# Patient Record
Sex: Male | Born: 1965 | Race: White | Hispanic: No | Smoking: Never smoker
Health system: Southern US, Community
[De-identification: ages and names within clinical notes are randomized; demographics above are authoritative.]

## PROBLEM LIST (undated history)

## (undated) DIAGNOSIS — C20 Malignant neoplasm of rectum: Secondary | ICD-10-CM

## (undated) DIAGNOSIS — F329 Major depressive disorder, single episode, unspecified: Secondary | ICD-10-CM

## (undated) DIAGNOSIS — F32A Depression, unspecified: Secondary | ICD-10-CM

## (undated) DIAGNOSIS — F419 Anxiety disorder, unspecified: Secondary | ICD-10-CM

## (undated) HISTORY — DX: Malignant neoplasm of rectum: C20

## (undated) HISTORY — PX: COLON SURGERY: SHX602

---

## 2013-12-10 ENCOUNTER — Ambulatory Visit (INDEPENDENT_AMBULATORY_CARE_PROVIDER_SITE_OTHER): Payer: BC Managed Care – PPO | Admitting: General Surgery

## 2013-12-10 ENCOUNTER — Encounter (INDEPENDENT_AMBULATORY_CARE_PROVIDER_SITE_OTHER): Payer: Self-pay | Admitting: General Surgery

## 2013-12-10 VITALS — BP 120/70 | HR 72 | Temp 97.1°F | Ht 69.0 in | Wt 170.8 lb

## 2013-12-10 DIAGNOSIS — K648 Other hemorrhoids: Secondary | ICD-10-CM

## 2013-12-10 NOTE — Progress Notes (Signed)
Subjective:     Patient ID: Jonathan Wright, male   DOB: 08-30-1965, 48 y.o.   MRN: [id number]  HPI Patient presents for followup of internal hemorrhoids. He saw me for this previously and underwent banding. He has developed some new symptoms with a mucus discharge. He also has a feeling of fullness in his rectal area. Occasional small amounts of bleeding. No significant pain.  Review of Systems     Objective:   Physical Exam  Constitutional: He appears well-developed and well-nourished.  Cardiovascular: Normal rate and normal heart sounds.   Pulmonary/Chest: Effort normal and breath sounds normal.  Abdominal: Soft. He exhibits no distension. There is no tenderness.  Genitourinary:  External anal exam reveals no external hemorrhoids or skin tags. Digital rectal exam reveals internal hemorrhoid towards the left side. Anoscopy reveals large inflamed internal hemorrhoid on the left side. Band applicator was used and 2 rubber bands were applied to this large internal hemorrhoid. There was no bleeding. He tolerated this well.       Assessment:     Inflamed internal hemorrhoid    Plan:     Rubber band ligation as above. Return in 6 weeks.

## 2014-01-21 ENCOUNTER — Encounter (INDEPENDENT_AMBULATORY_CARE_PROVIDER_SITE_OTHER): Payer: BC Managed Care – PPO | Admitting: General Surgery

## 2015-09-14 ENCOUNTER — Other Ambulatory Visit: Payer: Self-pay | Admitting: General Surgery

## 2015-09-14 NOTE — H&P (Signed)
History of Present Illness Leighton Ruff MD; 99991111 11:51 AM) The patient is a 50 year old male who presents with hemorrhoids. Pt has been seen in the past for hemorrhoid issues by Dr Grandville Silos. It sounds like he has done banding on occasion. More recently, his symptoms have changed. He feels a fullness in his rectal area, often feeling like he has to have a bowel movement. He is having constant internal pain on his left side. When he goes to the bathroom, frequently he only has a small bowel movement or passes gas. He is having has bleeding with bowel movements, and this is becoming more regular. He denies any significant constipation.   Problem List/Past Medical Leighton Ruff, MD; 99991111 11:51 AM) HEMORRHOIDS, INTERNAL, WITH BLEEDING (K64.8) RECTAL MASS (K62.9)  Other Problems Leighton Ruff, MD; 99991111 11:51 AM) Back Pain Anxiety Disorder  Past Surgical History Leighton Ruff, MD; 99991111 11:51 AM) No pertinent past surgical history  Diagnostic Studies History Leighton Ruff, MD; 99991111 11:51 AM) Colonoscopy never  Allergies Elbert Ewings, CMA; 09/14/2015 11:41 AM) Penicillin VK *PENICILLINS* Hives, Swelling.  Medication History Elbert Ewings, [place]; 09/14/2015 11:41 AM) No Current Medications Medications Reconciled  Social History Leighton Ruff, MD; 99991111 11:51 AM) Tobacco use Never smoker. No drug use Caffeine use Carbonated beverages, Tea. Alcohol use Occasional alcohol use.  Family History Leighton Ruff, MD; 99991111 11:51 AM) Family history unknown First Degree Relatives     Review of Systems Leighton Ruff MD; 99991111 11:51 AM) General Present- Fatigue. Not Present- Appetite Loss, Chills, Fever, Night Sweats, Weight Gain and Weight Loss. Skin Not Present- Change in Wart/Mole, Dryness, Hives, Jaundice, New Lesions, Non-Healing Wounds, Rash and Ulcer. HEENT Present- Wears glasses/contact lenses. Not Present- Earache, Hearing  Loss, Hoarseness, Nose Bleed, Oral Ulcers, Ringing in the Ears, Seasonal Allergies, Sinus Pain, Sore Throat, Visual Disturbances and Yellow Eyes. Respiratory Not Present- Bloody sputum, Chronic Cough, Difficulty Breathing, Snoring and Wheezing. Breast Not Present- Breast Mass, Breast Pain, Nipple Discharge and Skin Changes. Cardiovascular Not Present- Chest Pain, Difficulty Breathing Lying Down, Leg Cramps, Palpitations, Rapid Heart Rate, Shortness of Breath and Swelling of Extremities. Gastrointestinal Present- Bloody Stool, Excessive gas, Hemorrhoids and Rectal Pain. Not Present- Abdominal Pain, Bloating, Change in Bowel Habits, Chronic diarrhea, Constipation, Difficulty Swallowing, Gets full quickly at meals, Indigestion, Nausea and Vomiting. Male Genitourinary Not Present- Blood in Urine, Change in Urinary Stream, Frequency, Impotence, Nocturia, Painful Urination, Urgency and Urine Leakage. Musculoskeletal Present- Joint Pain. Not Present- Back Pain, Joint Stiffness, Muscle Pain, Muscle Weakness and Swelling of Extremities. Neurological Present- Headaches. Not Present- Decreased Memory, Fainting, Numbness, Seizures, Tingling, Tremor, Trouble walking and Weakness. Psychiatric Present- Anxiety and Change in Sleep Pattern. Not Present- Bipolar, Depression, Fearful and Frequent crying. Endocrine Not Present- Cold Intolerance, Excessive Hunger, Hair Changes, Heat Intolerance and New Diabetes.  Vitals Elbert Ewings CMA; 09/14/2015 11:42 AM) 09/14/2015 11:41 AM Weight: 179 lb Height: 69in Body Surface Area: 1.97 m Body Mass Index: 26.43 kg/m  Temp.: 97.59F(Temporal)  Pulse: 80 (Regular)  BP: 130/70 (Sitting, Left Arm, Standard)      Physical Exam Leighton Ruff MD; 99991111 11:57 AM)  General Mental Status-Alert. General Appearance-Not in acute distress. Build & Nutrition-Well nourished. Posture-Normal posture. Gait-Normal.  Head and Neck Head-normocephalic,  atraumatic with no lesions or palpable masses. Trachea-midline.  Chest and Lung Exam Chest and lung exam reveals -on auscultation, normal breath sounds, no adventitious sounds and normal vocal resonance.  Cardiovascular Cardiovascular examination reveals -normal heart sounds, regular rate and rhythm with no murmurs.  Abdomen Inspection Inspection of the abdomen reveals - No Hernias. Palpation/Percussion Palpation and Percussion of the abdomen reveal - Soft, Non Tender, No Rigidity (guarding), No hepatosplenomegaly and No Palpable abdominal masses.  Neurologic Neurologic evaluation reveals -alert and oriented x 3 with no impairment of recent or remote memory, normal attention span and ability to concentrate, normal sensation and normal coordination.  Musculoskeletal Normal Exam - Bilateral-Upper Extremity Strength Normal and Lower Extremity Strength Normal.    Assessment & Plan Leighton Ruff MD; 99991111 12:00 PM)  RECTAL MASS (K62.9) Impression: 50 year old male with rectal bleeding and a rectal mass palpated anteriorly on digital exam that spans the entire anterior distal rectum and anal canal. I am very concerned that this patient has a advanced rectal cancer. I have recommended a colonoscopy to begin his workup. If his biopsies are indeed positive for rectal cancer he will need a CT scan of the chest abdomen and pelvis and MRI of his pelvis for staging.

## 2015-09-15 ENCOUNTER — Telehealth: Payer: Self-pay | Admitting: *Deleted

## 2015-09-15 NOTE — Telephone Encounter (Signed)
The patient called requesting an appointment for a colonoscopy.  Saw Dr. Henri Medal, CCS.  He was told her has cancer. She advised him to call to schedule a colonoscopy.  I advised he needs to make an office visit.  Made appointment with Cecille Rubin Hvozdovic PA for Friday 09-17-2015 at 10:15 am.

## 2015-09-16 ENCOUNTER — Encounter (HOSPITAL_COMMUNITY): Payer: Self-pay | Admitting: *Deleted

## 2015-09-16 ENCOUNTER — Telehealth: Payer: Self-pay | Admitting: *Deleted

## 2015-09-16 NOTE — Telephone Encounter (Signed)
Per Claiborne Billings, Dr. Servando Snare nurse, Dr. Marcello Moores is going to do this patient's colonoscopy within the next week.  Claiborne Billings is trying to reach the patient. She advised me to cancel this appointment. I LM for him yesterday about the appointment and asked him to please call me if he decides to cancel.

## 2015-09-17 ENCOUNTER — Ambulatory Visit: Payer: Self-pay | Admitting: Physician Assistant

## 2015-09-18 ENCOUNTER — Encounter (HOSPITAL_COMMUNITY): Payer: Self-pay | Admitting: Anesthesiology

## 2015-09-18 NOTE — Anesthesia Preprocedure Evaluation (Deleted)
Anesthesia Evaluation  Patient identified by MRN, date of birth, ID band Patient awake    Reviewed: Allergy & Precautions, NPO status , Patient's Chart, lab work & pertinent test results  Airway        Dental   Pulmonary neg pulmonary ROS,           Cardiovascular negative cardio ROS       Neuro/Psych Anxiety Depression negative neurological ROS     GI/Hepatic Neg liver ROS, Rectal Mass, RO CA   Endo/Other  negative endocrine ROS  Renal/GU negative Renal ROS  negative genitourinary   Musculoskeletal negative musculoskeletal ROS (+)   Abdominal   Peds negative pediatric ROS (+)  Hematology negative hematology ROS (+)   Anesthesia Other Findings   Reproductive/Obstetrics negative OB ROS                            Anesthesia Physical Anesthesia Plan  ASA: II  Anesthesia Plan: MAC   Post-op Pain Management:    Induction: Intravenous  Airway Management Planned: Nasal Cannula  Additional Equipment:   Intra-op Plan:   Post-operative Plan:   Informed Consent: I have reviewed the patients History and Physical, chart, labs and discussed the procedure including the risks, benefits and alternatives for the proposed anesthesia with the patient or authorized representative who has indicated his/her understanding and acceptance.     Plan Discussed with:   Anesthesia Plan Comments:         Anesthesia Quick Evaluation

## 2015-09-24 ENCOUNTER — Ambulatory Visit (HOSPITAL_COMMUNITY): Admission: RE | Admit: 2015-09-24 | Payer: Self-pay | Source: Ambulatory Visit | Admitting: General Surgery

## 2015-09-24 HISTORY — DX: Anxiety disorder, unspecified: F41.9

## 2015-09-24 HISTORY — DX: Depression, unspecified: F32.A

## 2015-09-24 HISTORY — DX: Major depressive disorder, single episode, unspecified: F32.9

## 2015-09-24 SURGERY — COLONOSCOPY WITH PROPOFOL
Anesthesia: Monitor Anesthesia Care

## 2015-11-20 HISTORY — PX: PERIPHERALLY INSERTED CENTRAL CATHETER INSERTION: SHX2221

## 2015-12-16 ENCOUNTER — Encounter (HOSPITAL_COMMUNITY): Payer: Self-pay | Admitting: Hematology & Oncology

## 2015-12-16 ENCOUNTER — Encounter (HOSPITAL_COMMUNITY): Payer: BLUE CROSS/BLUE SHIELD | Attending: Hematology & Oncology | Admitting: Hematology & Oncology

## 2015-12-16 VITALS — BP 123/91 | HR 106 | Temp 98.4°F | Resp 18 | Ht 69.0 in | Wt 168.4 lb

## 2015-12-16 DIAGNOSIS — C20 Malignant neoplasm of rectum: Secondary | ICD-10-CM

## 2015-12-16 DIAGNOSIS — K5903 Drug induced constipation: Secondary | ICD-10-CM | POA: Diagnosis not present

## 2015-12-16 DIAGNOSIS — C210 Malignant neoplasm of anus, unspecified: Secondary | ICD-10-CM | POA: Insufficient documentation

## 2015-12-16 DIAGNOSIS — T402X5A Adverse effect of other opioids, initial encounter: Secondary | ICD-10-CM

## 2015-12-16 MED ORDER — ONDANSETRON HCL 8 MG PO TABS: 8.0000 mg | ORAL_TABLET | Freq: Three times a day (TID) | ORAL | Status: DC | PRN

## 2015-12-16 MED ORDER — PROCHLORPERAZINE MALEATE 10 MG PO TABS: 10.0000 mg | ORAL_TABLET | Freq: Four times a day (QID) | ORAL | Status: DC | PRN

## 2015-12-16 NOTE — Progress Notes (Signed)
Jonathan Wright  Progress Note  Patient Care Team: [name], MD as PCP - General (Family Medicine)  CHIEF COMPLAINTS/PURPOSE OF CONSULTATION:   Oncology History   Stage IIIB rectal adenocarcinoma, mT2N2M0 Treated with 5Gy x 5 at [facility]     Rectal cancer [facility])   09/20/2015 Procedure colonoscopy with firm rectal mass and 4 cm polyp in proximal rectum, infiltrating non-obstructing large mass within the distal rectum extending towards anal verge   09/20/2015 Miscellaneous Microsatellite stable.    09/28/2015 Imaging CT C/A/P no evidence of distant metastatic disease. irregular thickening of the low rectum and anal canal, several enlarged perirectal LN, indeterminate 53m RUL nodule   09/28/2015 Imaging MRI pelvis assym diffuse circum lobular thickening of rectum, near full wall thickness extension along lower anterior rectum W/O extra serosal extension c/w known adeno, mult. perirectal LN    HISTORY OF PRESENTING ILLNESS:  [name]89y.o. male is here for further follow-up of adenocarcinoma of the rectum. Tumor extends from the recto sigmoid junction to the anal verge. [facility] He wishes to receive his chemotherapy locally.   Mr. PDowsonwas here with his roommate [name].  In October 2016 he noticed that he had a dull pain all the time at his "sphincter muscle." He thought that this was another hemorrhoid because he has always had problems with this. He said that "he got used to something being there." He did note however, that this was a different kind of pain. He saw his routine surgeon and was referred to Dr. ALeighton Ruff  He saw Dr. TMarcello Mooresand this is when he was told that he had cancer. [facility]. He was told that he had a polyp the size of a golf ball. They did biopsies of this and was told that this was clear but that it needs to come  out. He was also told by a different doctor that he needed to have his entire rectum taken out and that he would need a ostomy bag for the rest of his life. He was shocked from this news and did not want to have to do this. He notes that [facility]   He understands "standard of care" but at least wanted to try to avoid an ostomy. He opted to be treated with short course radiation, 5GY x 5 of XRT followed by chemotherapy.  He finished this radiation last Friday. He is here today to discuss chemotherapy. He would rather have a PICC line rather than having a port placed if possible.   He notes that resection was discussed after completion of therapy but he wants to avoid this if possible. He notes today that he understands that watch/wait approach if he achieves a CR is investigational.   He has a prescription for Oxycodone and has been needing to take this recently.  He has noticed that for the last couple of days he has had to strain when he has bowel movements. He thinks this may be secondary to his pain medication.   He denies any nausea, vomiting, blood in stool, or pain in his lymph nodes of the groin.   MEDICAL HISTORY:  Past Medical History  Diagnosis Date  . Depression   . Anxiety     SURGICAL HISTORY: History reviewed. No pertinent past surgical history.  SOCIAL HISTORY: Social History   Social History  . Marital Status: Divorced    Spouse Name: [name]  . Number of Children: [name]  . Years of Education: [name]   Occupational History  . Not on file.   Social History Main Topics  . Smoking status: Never Smoker   . Smokeless tobacco: Not on file  . Alcohol Use: Yes     Comment: occ.   . Drug Use: No  . Sexual Activity: Not on file   Other Topics Concern  . 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 No pertinent family history. Mother-Healthy Father-deceased; late 67s early 33s. Doesn't know much about him 2 brothers-both healthy No other cancer in his family ALLERGIES:  is allergic to penicillins and penicillin g.  MEDICATIONS:  Current Outpatient Prescriptions  Medication Sig Dispense Refill  . oxyCODONE (OXY IR/ROXICODONE) 5 MG immediate release tablet Take by mouth.    . traMADol (ULTRAM) 50 MG tablet Take by mouth.    . ondansetron (ZOFRAN) 8 MG tablet Take 1 tablet (8 mg total) by mouth every 8 (eight) hours as needed for nausea or vomiting. 30 tablet 2  . prochlorperazine (COMPAZINE) 10 MG tablet Take 1 tablet (10 mg total) by mouth every 6 (six) hours as needed for nausea or vomiting. 30 tablet 2   No current facility-administered medications for this visit.    Review of Systems  Constitutional: Negative.  Negative for fever, chills, weight loss and malaise/fatigue.  HENT: Negative.  Negative for congestion, hearing loss, nosebleeds, sore throat and tinnitus.   Eyes: Negative.  Negative for blurred vision, double vision, pain and discharge.  Respiratory: Negative.  Negative for cough, hemoptysis, sputum production, shortness of breath and wheezing.   Cardiovascular: Negative.  Negative for chest pain, palpitations, claudication, leg swelling and PND.  Gastrointestinal: Positive for constipation. Negative for heartburn, nausea, vomiting, abdominal pain, diarrhea, blood in stool and melena.       Rectal pain  Genitourinary: Negative.  Negative for dysuria, urgency, frequency and hematuria.  Musculoskeletal: Negative.  Negative for myalgias, joint pain and falls.  Skin: Negative.  Negative for itching and rash.  Neurological: Negative.  Negative for dizziness, tingling, tremors, sensory change, speech change, focal weakness, seizures, loss of consciousness, weakness and headaches.  Endo/Heme/Allergies: Negative.   Does not bruise/bleed easily.  Psychiatric/Behavioral: Negative.  Negative for depression, suicidal ideas, memory loss and substance abuse. The patient is not nervous/anxious and does not have insomnia.   All other systems reviewed and are negative.  14 point ROS was done and is otherwise as detailed above or in HPI   PHYSICAL EXAMINATION: ECOG PERFORMANCE STATUS: 1 - Symptomatic but completely ambulatory  Filed Vitals:   12/16/15 1445  BP: 123/91  Pulse: 106  Temp: 98.4 F (36.9 C)  Resp: 18   Filed Weights   12/16/15 1445  Weight: 168 lb 6.4 oz (76.386 kg)   Physical Exam  Constitutional: He is oriented to person, place, and time and well-developed, well-nourished, and in no distress.  HENT:  Head: Normocephalic and atraumatic.  Nose: Nose normal.  Mouth/Throat: Oropharynx is clear and moist. No oropharyngeal exudate.  Eyes: Conjunctivae and EOM are normal. Pupils are equal, round, and reactive to light. Right eye exhibits no discharge. Left eye exhibits no discharge. No  scleral icterus.  Neck: Normal range of motion. Neck supple. No tracheal deviation present. No thyromegaly present.  Cardiovascular: Normal rate, regular rhythm and normal heart sounds.  Exam reveals no gallop and no friction rub.   No murmur heard. Pulmonary/Chest: Effort normal and breath sounds normal. He has no wheezes. He has no rales.  Abdominal: Soft. Bowel sounds are normal. He exhibits no distension and no mass. There is no tenderness. There is no rebound and no guarding.  Musculoskeletal: Normal range of motion. He exhibits no edema.  Lymphadenopathy:    He has no cervical adenopathy.  Neurological: He is alert and oriented to person, place, and time. He has normal reflexes. No cranial nerve deficit. Gait normal. Coordination normal.  Skin: Skin is warm and dry. No rash noted.  Psychiatric: Mood, memory, affect and judgment normal.  Nursing note and vitals reviewed.  LABORATORY DATA:  I have  reviewed the data as listed        RADIOGRAPHIC STUDIES: Reports reviewed and as detailed in Oncology History   ASSESSMENT & PLAN:  T2N2aM0 adenocarcinoma of the rectum Tumor extends from rectosigmoid junction to anal verge S/P short course [facility]RT at [facility]  We spent time discussing his care to date. He is certainly well educated regarding his treatment options and choices. We discussed chemotherapy and specifically FOLFOX. I advised him that I prefer FOLFOX over CapeOx for multiple reasons, I certainly feel it has much more predictable toxicities.   He would like a PICC. I do not feel this is unreasonable. We can arrange for PICC line placement prior to initiation of chemotherapy on the 8th.   We discussed narcotic induced constipation and he was provided with a constipation sheet. I emphasized the importance of preventing constipation.   He will return on 01/03/16 for a chemotherapy follow-up with Tom to assess tolerance. We will arrange for chemotherapy teaching with [facility]. He will meet our patient navigator [place] today.    All questions were answered. The patient knows to call the clinic with any problems, questions or concerns.  This document serves as a record of services personally performed by Ancil Linsey, MD. It was created on her behalf by Kandace Blitz, a trained medical scribe. The creation of this record is based on the scribe's personal observations and the provider's statements to them. This document has been checked and approved by the attending provider.  I have reviewed the above documentation for accuracy and completeness, and I agree with the above.  This note was electronically signed.    Molli Hazard, MD  12/16/2015 3:52 PM

## 2015-12-16 NOTE — Progress Notes (Signed)
[facility] at [facility]  Discharge Instructions:   _______________________________________________________________  Thank you for choosing [facility] at [facility] to provide your oncology and hematology care.  To afford each patient quality time with our providers, please arrive at least 15 minutes before your scheduled appointment.  You need to re-schedule your appointment if you arrive 10 or more minutes late.  We strive to give you quality time with our providers, and arriving late affects you and other patients whose appointments are after yours.  Also, if you no show three or more times for appointments you may be dismissed from the clinic.  Again, thank you for choosing [facility] at [facility]. Our hope is that these requests will allow you access to exceptional care and in a timely manner. _______________________________________________________________  If you have questions after your visit, please contact our office at [phone] between the hours of 8:30 a.m. and 5:00 p.m. Voicemails left after 4:30 p.m. will not be returned until the following business day. _______________________________________________________________  For prescription refill requests, have your pharmacy contact our office. _______________________________________________________________  Recommendations made by the consultant and any test results will be sent to your referring physician. _______________________________________________________________ 

## 2015-12-16 NOTE — Patient Instructions (Addendum)
[place] at [facility] Discharge Instructions  RECOMMENDATIONS MADE BY THE CONSULTANT AND ANY TEST RESULTS WILL BE SENT TO YOUR REFERRING PHYSICIAN.   Exam and discussion by Dr Whitney Muse today You will start FOLFOX, you will have 6 cycles Jonathan Wright is our nurse navigator, she will do the teaching for your drugs and get you started here at the clinic.  You can contact her at [phone] if you have any questions.  She does have a Advertising account executive. We will get a PICC line inserted. Short Stay will do this procedure. Please call the clinic if you have any questions or concerns     Thank you for choosing [place] [facility] to provide your oncology and hematology care.  To afford each patient quality time with our provider, please arrive at least 15 minutes before your scheduled appointment time.   Beginning January 23rd 2017 lab work for the [facility] will be done in the  Main lab at [facility] on 1st floor. If you have a lab appointment with the [facility] please come in thru the  Main Entrance and check in at the main information desk  You need to re-schedule your appointment should you arrive 10 or more minutes late.  We strive to give you quality time with our providers, and arriving late affects you and other patients whose appointments are after yours.  Also, if you no show three or more times for appointments you may be dismissed from the clinic at the providers discretion.     Again, thank you for choosing [facility].  Our hope is that these requests will decrease the amount of time that you wait before being seen by our physicians.       _____________________________________________________________  Should you have questions after your visit to [facility], please contact our office at [phone] between the hours of 8:30 a.m. and 4:30 p.m.  Voicemails left after 4:30 p.m. will not be returned until  the following business day.  For prescription refill requests, have your pharmacy contact our office.         Resources For Cancer Patients and their Caregivers ? American Cancer Society: Can assist with transportation, wigs, general needs, runs Look Good Feel Better.        (216) 455-4634 ? Cancer Care: Provides financial assistance, online support groups, medication/co-pay assistance.  1-800-813-HOPE 330-763-4271) ? [place] Assists [place] cancer patients and their families through emotional , educational and financial support.  (214)830-8280 ? [place] DSS Where to apply for food stamps, Medicaid and utility assistance. 830-445-5277 ? [facility]: Transportation to medical appointments. (619) 702-4007 ? Social Security Administration: May apply for disability if have a Stage IV cancer. (636) 741-3773 (802) 206-6974 ? [employer], Disability and Transit Services: Assists with nutrition, care and transit needs. 254-200-4036

## 2015-12-17 ENCOUNTER — Encounter (HOSPITAL_COMMUNITY): Payer: Self-pay | Admitting: Hematology & Oncology

## 2015-12-17 DIAGNOSIS — C20 Malignant neoplasm of rectum: Secondary | ICD-10-CM

## 2015-12-17 HISTORY — DX: Malignant neoplasm of rectum: C20

## 2015-12-22 ENCOUNTER — Other Ambulatory Visit (HOSPITAL_COMMUNITY): Payer: Self-pay | Admitting: Hematology & Oncology

## 2015-12-23 ENCOUNTER — Other Ambulatory Visit (HOSPITAL_COMMUNITY): Payer: Self-pay | Admitting: Oncology

## 2015-12-23 ENCOUNTER — Other Ambulatory Visit (HOSPITAL_COMMUNITY): Payer: Self-pay | Admitting: *Deleted

## 2015-12-23 DIAGNOSIS — C20 Malignant neoplasm of rectum: Secondary | ICD-10-CM

## 2015-12-23 NOTE — Patient Instructions (Addendum)
[place]   CHEMOTHERAPY INSTRUCTIONS  Premeds: Aloxi - for nausea/vomiting prevention/reduction. Dexamethasone - steroid - given to reduce the risk of you having an allergic type reaction to the chemotherapy. Dex can cause you to feel energized, nervous/anxious/jittery, make you have trouble sleeping, and/or make you feel hot/flushed in the face/neck and/or look pink/red in the face/neck. These side effects will pass as the Dex wears off. (takes 20 minutes to infuse)  Oxaliplatin - anaphylactic reaction, neurotoxicity (i.e., headache, fatigue, difficulty sleeping, pain). Peripheral neuropathy (numbness/tingling/burning in hands/fingers/feet/toes) - will be aggravated by cold/cool temperatures. We need to know when you develop peripheral neuropathy so that we can monitor it and treat if necessary. Nausea/vomiting, diarrhea, bone marrow suppression (lowers white blood cells (fight infection), lowers red blood cells (make up your blood), lowers platelets (help blood to clot). Pulmonary fibrosis. Once you have received Oxaliplatin do NOT eat or drinking anything cold/cool for 5-10 days! Do NOT breathe in cold/cool air and do NOT touch anything cold for 5-10 days. The time frame varies from patient to patient on the length of time you must abstain from the above mentioned. Best advice is to wait at least 5 days before attempting to reintroduce cold/cool back into life. Slowly reintroduce cool/cold things! Wear gloves when getting items out of the refrigerator (of course, these would be things you are going to heat to eat)! (takes 2 hours to infuse)  Leucovorin - this is a medication that is not chemo but given with chemo. This med "rescues" the healthy cells before we administer the drug 5FU. This makes the 5FU work better. (takes 2 hours to infuse- this goes in @ the same time as the Oxaliplatin or Irinotecan chemo)  5FU: bone marrow suppression (low white blood cells  - wbcs fight infection, low red blood cells - rbcs make up your blood, low platelets - this is what makes your blood clot, nausea/vomiting, diarrhea, mouth sores, hair loss, dry skin, ocular toxicities (increased tear production, sensitivity to light). You must wear sunscreen/sunglasses. Cover your skin when out in sunlight. You will get burned very easily. The nurse will sit @ your bedside and push this medication in via a syringe. This will take 10 minutes. Then she will connect you to an ambulatory pump with this medication attached. You will wear the pump for 46 hours. The 5FU chemo will infuse for 46 hours total. Then the pump will be removed).      POTENTIAL SIDE EFFECTS OF TREATMENT: Increased Susceptibility to Infection, Vomiting, Constipation, Hair Thinning, Changes in Character of Skin and Nails (brittleness, dryness,etc.), Pigment Changes (darkening of nail beds, palms of hands, soles of feet, etc.), Bone Marrow Suppression, Abdominal Cramping and Urinary Frequency   EDUCATIONAL MATERIALS GIVEN AND REVIEWED: Chemotherapy and You booklet Specific Instructions Sheets: Oxaliplatin, Leucovorin, 5FU   SELF CARE ACTIVITIES WHILE ON CHEMOTHERAPY: Increase your fluid intake 48 hours prior to treatment and drink at least 2 quarts per day after treatment., No alcohol intake., No aspirin or other medications unless approved by your oncologist., Eat foods that are light and easy to digest., No fried, fatty, or spicy foods immediately before or after treatment., Have teeth cleaned professionally before starting treatment. Keep dentures and partial plates clean., Use soft toothbrush and do not use mouthwashes that contain alcohol. Biotene is a good mouthwash that is available at most pharmacies or may be ordered by calling [phone]., Use warm salt water gargles (1 teaspoon salt per 1 quart  warm water) before and after meals and at bedtime. Or you may rinse with 2 tablespoons of three -percent  hydrogen peroxide mixed in eight ounces of water., Always use sunscreen with SPF (Sun Protection Factor) of 50 or higher., Use your nausea medication as directed to prevent nausea., Use your stool softener or laxative as directed to prevent constipation. and Use your anti-diarrheal medication as directed to stop diarrhea.  Please wash your hands for at least 30 seconds using warm soapy water. Handwashing is the #1 way to prevent the spread of germs. Stay away from sick people or people who are getting over a cold. If you develop respiratory systems such as green/yellow mucus production or productive cough or persistent cough let us know and we will see if you need an antibiotic. It is a good idea to keep a pair of gloves on when going into grocery stores/[employer] to decrease your risk of coming into contact with germs on the carts, etc. Carry alcohol hand gel with you at all times and use it frequently if out in public. All foods need to be cooked thoroughly. No raw foods. No medium or undercooked meats, eggs. If your food is cooked medium well, it does not need to be hot pink or saturated with bloody liquid at all. Vegetables and fruits need to be washed/rinsed under the faucet with a dish detergent before being consumed. You can eat raw fruits and vegetables unless we tell you otherwise but it would be best if you cooked them or bought frozen. Do not eat off of salad bars or hot bars unless you really trust the cleanliness of the restaurant. If you need dental work, please let Dr. Whitney Muse know before you go for your appointment so that we can coordinate the best possible time for you in regards to your chemo regimen. You need to also let your dentist know that you are actively taking chemo. We may need to do labs prior to your dental appointment. We also want your bowels moving at least every other day. If this is not happening, we need to know so that we can get you on a bowel regimen to help you go. If you are  going to have sex, you must wear a condom to prevent your partner from chemotherapy exposure. This should be done for up to 28 days post chemo completion.      MEDICATIONS: You have been given prescriptions for the following medications:  Zofran/Ondansetron 8mg  tablet. Take 1 tablet every 8 hours as needed for nausea/vomiting. (#1 nausea med to take, this can constipate)  Compazine/Prochlorperazine 10mg  tablet. Take 1 tablet every 6 hours as needed for nausea/vomiting. (#2 nausea med to take, this can make you sleepy)  EMLA cream. Apply a quarter size amount to port site 1 hour prior to chemo. Do not rub in. Cover with plastic wrap.   Over-the-Counter Meds:  Miralax 1 capful in 8 oz of fluid daily. May increase to two times a day if needed. This is a stool softener. If this doesn't work proceed you can add:  Senokot S  - start with 1 tablet two times a day and increase to 4 tablets two times a day if needed. (total of 8 tablets in a 24 hour period). This is a stimulant laxative.   Call us if this does not help your bowels move.   Imodium 2mg  capsule. Take 2 capsules after the 1st loose stool and then 1 capsule every 2 hours until you go  a total of 12 hours without having a loose stool. Call the [facility] if loose stools continue. If diarrhea occurs @ bedtime, take 2 capsules @ bedtime. Then take 2 capsules every 4 hours until morning. Call [facility].     SYMPTOMS TO REPORT AS SOON AS POSSIBLE AFTER TREATMENT:  FEVER GREATER THAN 100.5 F  CHILLS WITH OR WITHOUT FEVER  NAUSEA AND VOMITING THAT IS NOT CONTROLLED WITH YOUR NAUSEA MEDICATION  UNUSUAL SHORTNESS OF BREATH  UNUSUAL BRUISING OR BLEEDING  TENDERNESS IN MOUTH AND THROAT WITH OR WITHOUT PRESENCE OF ULCERS  URINARY PROBLEMS  BOWEL PROBLEMS  UNUSUAL RASH    Wear comfortable clothing and clothing appropriate for easy access to any Portacath or PICC line. Let us know if there is anything that we can do to  make your therapy better!      I have been informed and understand all of the instructions given to me and have received a copy. I have been instructed to call the clinic [phone] or my family physician as soon as possible for continued medical care, if indicated. I do not have any more questions at this time but understand that I may call the [facility] or the Patient Navigator at [phone] during office hours should I have questions or need assistance in obtaining follow-up care.           Palonosetron Injection What is this medicine? PALONOSETRON (pal oh NOE se tron) is used to prevent nausea and vomiting caused by chemotherapy. It also helps prevent delayed nausea and vomiting that may occur a few days after your treatment. This medicine may be used for other purposes; ask your health care provider or pharmacist if you have questions. What should I tell my health care provider before I take this medicine? They need to know if you have any of these conditions: -an unusual or allergic reaction to palonosetron, dolasetron, granisetron, ondansetron, other medicines, foods, dyes, or preservatives -pregnant or trying to get pregnant -breast-feeding How should I use this medicine? This medicine is for infusion into a vein. It is given by a health care professional in a hospital or clinic setting. Talk to your pediatrician regarding the use of this medicine in children. While this drug may be prescribed for children as young as 1 month for selected conditions, precautions do apply. Overdosage: If you think you have taken too much of this medicine contact a poison control center or emergency room at once. NOTE: This medicine is only for you. Do not share this medicine with others. What if I miss a dose? This does not apply. What may interact with this medicine? -certain medicines for depression, anxiety, or psychotic disturbances -fentanyl -linezolid -MAOIs like Carbex,  Eldepryl, Marplan, Nardil, and Parnate -methylene blue (injected into a vein) -tramadol This list may not describe all possible interactions. Give your health care provider a list of all the medicines, herbs, non-prescription drugs, or dietary supplements you use. Also tell them if you smoke, drink alcohol, or use illegal drugs. Some items may interact with your medicine. What should I watch for while using this medicine? Your condition will be monitored carefully while you are receiving this medicine. What side effects may I notice from receiving this medicine? Side effects that you should report to your doctor or health care professional as soon as possible: -allergic reactions like skin rash, itching or hives, swelling of the face, lips, or tongue -breathing problems -confusion -dizziness -fast, irregular heartbeat -fever and  chills -loss of balance or coordination -seizures -sweating -swelling of the hands and feet -tremors -unusually weak or tired Side effects that usually do not require medical attention (report to your doctor or health care professional if they continue or are bothersome): -constipation or diarrhea -headache This list may not describe all possible side effects. Call your doctor for medical advice about side effects. You may report side effects to FDA at 1-800-FDA-1088. Where should I keep my medicine? This drug is given in a hospital or clinic and will not be stored at home. NOTE: This sheet is a summary. It may not cover all possible information. If you have questions about this medicine, talk to your doctor, pharmacist, or health care provider.    2016, Elsevier/Gold Standard. (2013-06-13 10:38:36) Dexamethasone injection What is this medicine? DEXAMETHASONE (dex a METH a sone) is a corticosteroid. It is used to treat inflammation of the skin, joints, lungs, and other organs. Common conditions treated include asthma, allergies, and arthritis. It is also used  for other conditions, like blood disorders and diseases of the adrenal glands. This medicine may be used for other purposes; ask your health care provider or pharmacist if you have questions. What should I tell my health care provider before I take this medicine? They need to know if you have any of these conditions: -blood clotting problems -Cushing's syndrome -diabetes -glaucoma -heart problems or disease -high blood pressure -infection like herpes, measles, tuberculosis, or chickenpox -kidney disease -liver disease -mental problems -myasthenia gravis -osteoporosis -previous heart attack -seizures -stomach, ulcer or intestine disease including colitis and diverticulitis -thyroid problem -an unusual or allergic reaction to dexamethasone, corticosteroids, other medicines, lactose, foods, dyes, or preservatives -pregnant or trying to get pregnant -breast-feeding How should I use this medicine? This medicine is for injection into a muscle, joint, lesion, soft tissue, or vein. It is given by a health care professional in a hospital or clinic setting. Talk to your pediatrician regarding the use of this medicine in children. Special care may be needed. Overdosage: If you think you have taken too much of this medicine contact a poison control center or emergency room at once. NOTE: This medicine is only for you. Do not share this medicine with others. What if I miss a dose? This may not apply. If you are having a series of injections over a prolonged period, try not to miss an appointment. Call your doctor or health care professional to reschedule if you are unable to keep an appointment. What may interact with this medicine? Do not take this medicine with any of the following medications: -mifepristone, RU-486 -vaccines This medicine may also interact with the following medications: -amphotericin B -antibiotics like clarithromycin, erythromycin, and troleandomycin -aspirin and  aspirin-like drugs -barbiturates like phenobarbital -carbamazepine -cholestyramine -cholinesterase inhibitors like donepezil, galantamine, rivastigmine, and tacrine -cyclosporine -digoxin -diuretics -ephedrine -male hormones, like estrogens or progestins and birth control pills -indinavir -isoniazid -ketoconazole -medicines for diabetes -medicines that improve muscle tone or strength for conditions like myasthenia gravis -NSAIDs, medicines for pain and inflammation, like ibuprofen or naproxen -phenytoin -rifampin -thalidomide -warfarin This list may not describe all possible interactions. Give your health care provider a list of all the medicines, herbs, non-prescription drugs, or dietary supplements you use. Also tell them if you smoke, drink alcohol, or use illegal drugs. Some items may interact with your medicine. What should I watch for while using this medicine? Your condition will be monitored carefully while you are receiving this medicine. If you are taking  this medicine for a long time, carry an identification card with your name and address, the type and dose of your medicine, and your doctor's name and address. This medicine may increase your risk of getting an infection. Stay away from people who are sick. Tell your doctor or health care professional if you are around anyone with measles or chickenpox. Talk to your health care provider before you get any vaccines that you take this medicine. If you are going to have surgery, tell your doctor or health care professional that you have taken this medicine within the last twelve months. Ask your doctor or health care professional about your diet. You may need to lower the amount of salt you eat. The medicine can increase your blood sugar. If you are a diabetic check with your doctor if you need help adjusting the dose of your diabetic medicine. What side effects may I notice from receiving this medicine? Side effects that you  should report to your doctor or health care professional as soon as possible: -allergic reactions like skin rash, itching or hives, swelling of the face, lips, or tongue -black or tarry stools -change in the amount of urine -changes in vision -confusion, excitement, restlessness, a false sense of well-being -fever, sore throat, sneezing, cough, or other signs of infection, wounds that will not heal -hallucinations -increased thirst -mental depression, mood swings, mistaken feelings of self importance or of being mistreated -pain in hips, back, ribs, arms, shoulders, or legs -pain, redness, or irritation at the injection site -redness, blistering, peeling or loosening of the skin, including inside the mouth -rounding out of face -swelling of feet or lower legs -unusual bleeding or bruising -unusual tired or weak -wounds that do not heal Side effects that usually do not require medical attention (report to your doctor or health care professional if they continue or are bothersome): -diarrhea or constipation -change in taste -headache -nausea, vomiting -skin problems, acne, thin and shiny skin -touble sleeping -unusual growth of hair on the face or body -weight gain This list may not describe all possible side effects. Call your doctor for medical advice about side effects. You may report side effects to FDA at 1-800-FDA-1088. Where should I keep my medicine? This drug is given in a hospital or clinic and will not be stored at home. NOTE: This sheet is a summary. It may not cover all possible information. If you have questions about this medicine, talk to your doctor, pharmacist, or health care provider.    2016, Elsevier/Gold Standard. (2007-11-28 14:04:12) Oxaliplatin Injection What is this medicine? OXALIPLATIN (ox AL i PLA tin) is a chemotherapy drug. It targets fast dividing cells, like cancer cells, and causes these cells to die. This medicine is used to treat cancers of the  colon and rectum, and many other cancers. This medicine may be used for other purposes; ask your health care provider or pharmacist if you have questions. What should I tell my health care provider before I take this medicine? They need to know if you have any of these conditions: -kidney disease -an unusual or allergic reaction to oxaliplatin, other chemotherapy, other medicines, foods, dyes, or preservatives -pregnant or trying to get pregnant -breast-feeding How should I use this medicine? This drug is given as an infusion into a vein. It is administered in a hospital or clinic by a specially trained health care professional. Talk to your pediatrician regarding the use of this medicine in children. Special care may be needed. Overdosage: If you  think you have taken too much of this medicine contact a poison control center or emergency room at once. NOTE: This medicine is only for you. Do not share this medicine with others. What if I miss a dose? It is important not to miss a dose. Call your doctor or health care professional if you are unable to keep an appointment. What may interact with this medicine? -medicines to increase blood counts like filgrastim, pegfilgrastim, sargramostim -probenecid -some antibiotics like amikacin, gentamicin, neomycin, polymyxin B, streptomycin, tobramycin -zalcitabine Talk to your doctor or health care professional before taking any of these medicines: -acetaminophen -aspirin -ibuprofen -ketoprofen -naproxen This list may not describe all possible interactions. Give your health care provider a list of all the medicines, herbs, non-prescription drugs, or dietary supplements you use. Also tell them if you smoke, drink alcohol, or use illegal drugs. Some items may interact with your medicine. What should I watch for while using this medicine? Your condition will be monitored carefully while you are receiving this medicine. You will need important blood work  done while you are taking this medicine. This medicine can make you more sensitive to cold. Do not drink cold drinks or use ice. Cover exposed skin before coming in contact with cold temperatures or cold objects. When out in cold weather wear warm clothing and cover your mouth and nose to warm the air that goes into your lungs. Tell your doctor if you get sensitive to the cold. This drug may make you feel generally unwell. This is not uncommon, as chemotherapy can affect healthy cells as well as cancer cells. Report any side effects. Continue your course of treatment even though you feel ill unless your doctor tells you to stop. In some cases, you may be given additional medicines to help with side effects. Follow all directions for their use. Call your doctor or health care professional for advice if you get a fever, chills or sore throat, or other symptoms of a cold or flu. Do not treat yourself. This drug decreases your body's ability to fight infections. Try to avoid being around people who are sick. This medicine may increase your risk to bruise or bleed. Call your doctor or health care professional if you notice any unusual bleeding. Be careful brushing and flossing your teeth or using a toothpick because you may get an infection or bleed more easily. If you have any dental work done, tell your dentist you are receiving this medicine. Avoid taking products that contain aspirin, acetaminophen, ibuprofen, naproxen, or ketoprofen unless instructed by your doctor. These medicines may hide a fever. Do not become pregnant while taking this medicine. Women should inform their doctor if they wish to become pregnant or think they might be pregnant. There is a potential for serious side effects to an unborn child. Talk to your health care professional or pharmacist for more information. Do not breast-feed an infant while taking this medicine. Call your doctor or health care professional if you get diarrhea. Do  not treat yourself. What side effects may I notice from receiving this medicine? Side effects that you should report to your doctor or health care professional as soon as possible: -allergic reactions like skin rash, itching or hives, swelling of the face, lips, or tongue -low blood counts - This drug may decrease the number of white blood cells, red blood cells and platelets. You may be at increased risk for infections and bleeding. -signs of infection - fever or chills, cough, sore throat, pain  or difficulty passing urine -signs of decreased platelets or bleeding - bruising, pinpoint red spots on the skin, black, tarry stools, nosebleeds -signs of decreased red blood cells - unusually weak or tired, fainting spells, lightheadedness -breathing problems -chest pain, pressure -cough -diarrhea -jaw tightness -mouth sores -nausea and vomiting -pain, swelling, redness or irritation at the injection site -pain, tingling, numbness in the hands or feet -problems with balance, talking, walking -redness, blistering, peeling or loosening of the skin, including inside the mouth -trouble passing urine or change in the amount of urine Side effects that usually do not require medical attention (report to your doctor or health care professional if they continue or are bothersome): -changes in vision -constipation -hair loss -loss of appetite -metallic taste in the mouth or changes in taste -stomach pain This list may not describe all possible side effects. Call your doctor for medical advice about side effects. You may report side effects to FDA at 1-800-FDA-1088. Where should I keep my medicine? This drug is given in a hospital or clinic and will not be stored at home. NOTE: This sheet is a summary. It may not cover all possible information. If you have questions about this medicine, talk to your doctor, pharmacist, or health care provider.    2016, Elsevier/Gold Standard. (2008-03-03  17:22:47) Leucovorin injection What is this medicine? LEUCOVORIN (loo koe VOR in) is used to prevent or treat the harmful effects of some medicines. This medicine is used to treat anemia caused by a low amount of folic acid in the body. It is also used with 5-fluorouracil (5-FU) to treat colon cancer. This medicine may be used for other purposes; ask your health care provider or pharmacist if you have questions. What should I tell my health care provider before I take this medicine? They need to know if you have any of these conditions: -anemia from low levels of vitamin B-12 in the blood -an unusual or allergic reaction to leucovorin, folic acid, other medicines, foods, dyes, or preservatives -pregnant or trying to get pregnant -breast-feeding How should I use this medicine? This medicine is for injection into a muscle or into a vein. It is given by a health care professional in a hospital or clinic setting. Talk to your pediatrician regarding the use of this medicine in children. Special care may be needed. Overdosage: If you think you have taken too much of this medicine contact a poison control center or emergency room at once. NOTE: This medicine is only for you. Do not share this medicine with others. What if I miss a dose? This does not apply. What may interact with this medicine? -capecitabine -fluorouracil -phenobarbital -phenytoin -primidone -trimethoprim-sulfamethoxazole This list may not describe all possible interactions. Give your health care provider a list of all the medicines, herbs, non-prescription drugs, or dietary supplements you use. Also tell them if you smoke, drink alcohol, or use illegal drugs. Some items may interact with your medicine. What should I watch for while using this medicine? Your condition will be monitored carefully while you are receiving this medicine. This medicine may increase the side effects of 5-fluorouracil, 5-FU. Tell your doctor or health  care professional if you have diarrhea or mouth sores that do not get better or that get worse. What side effects may I notice from receiving this medicine? Side effects that you should report to your doctor or health care professional as soon as possible: -allergic reactions like skin rash, itching or hives, swelling of the face, lips, or  tongue -breathing problems -fever, infection -mouth sores -unusual bleeding or bruising -unusually weak or tired Side effects that usually do not require medical attention (report to your doctor or health care professional if they continue or are bothersome): -constipation or diarrhea -loss of appetite -nausea, vomiting This list may not describe all possible side effects. Call your doctor for medical advice about side effects. You may report side effects to FDA at 1-800-FDA-1088. Where should I keep my medicine? This drug is given in a hospital or clinic and will not be stored at home. NOTE: This sheet is a summary. It may not cover all possible information. If you have questions about this medicine, talk to your doctor, pharmacist, or health care provider.    2016, Elsevier/Gold Standard. (2008-02-11 16:50:29) Fluorouracil, 5-FU injection What is this medicine? FLUOROURACIL, 5-FU (flure oh YOOR a sil) is a chemotherapy drug. It slows the growth of cancer cells. This medicine is used to treat many types of cancer like breast cancer, colon or rectal cancer, pancreatic cancer, and stomach cancer. This medicine may be used for other purposes; ask your health care provider or pharmacist if you have questions. What should I tell my health care provider before I take this medicine? They need to know if you have any of these conditions: -blood disorders -dihydropyrimidine dehydrogenase (DPD) deficiency -infection (especially a virus infection such as chickenpox, cold sores, or herpes) -kidney disease -liver disease -malnourished, poor nutrition -recent or  ongoing radiation therapy -an unusual or allergic reaction to fluorouracil, other chemotherapy, other medicines, foods, dyes, or preservatives -pregnant or trying to get pregnant -breast-feeding How should I use this medicine? This drug is given as an infusion or injection into a vein. It is administered in a hospital or clinic by a specially trained health care professional. Talk to your pediatrician regarding the use of this medicine in children. Special care may be needed. Overdosage: If you think you have taken too much of this medicine contact a poison control center or emergency room at once. NOTE: This medicine is only for you. Do not share this medicine with others. What if I miss a dose? It is important not to miss your dose. Call your doctor or health care professional if you are unable to keep an appointment. What may interact with this medicine? -allopurinol -cimetidine -dapsone -digoxin -hydroxyurea -leucovorin -levamisole -medicines for seizures like ethotoin, fosphenytoin, phenytoin -medicines to increase blood counts like filgrastim, pegfilgrastim, sargramostim -medicines that treat or prevent blood clots like warfarin, enoxaparin, and dalteparin -methotrexate -metronidazole -pyrimethamine -some other chemotherapy drugs like busulfan, cisplatin, estramustine, vinblastine -trimethoprim -trimetrexate -vaccines Talk to your doctor or health care professional before taking any of these medicines: -acetaminophen -aspirin -ibuprofen -ketoprofen -naproxen This list may not describe all possible interactions. Give your health care provider a list of all the medicines, herbs, non-prescription drugs, or dietary supplements you use. Also tell them if you smoke, drink alcohol, or use illegal drugs. Some items may interact with your medicine. What should I watch for while using this medicine? Visit your doctor for checks on your progress. This drug may make you feel generally  unwell. This is not uncommon, as chemotherapy can affect healthy cells as well as cancer cells. Report any side effects. Continue your course of treatment even though you feel ill unless your doctor tells you to stop. In some cases, you may be given additional medicines to help with side effects. Follow all directions for their use. Call your doctor or health care professional  for advice if you get a fever, chills or sore throat, or other symptoms of a cold or flu. Do not treat yourself. This drug decreases your body's ability to fight infections. Try to avoid being around people who are sick. This medicine may increase your risk to bruise or bleed. Call your doctor or health care professional if you notice any unusual bleeding. Be careful brushing and flossing your teeth or using a toothpick because you may get an infection or bleed more easily. If you have any dental work done, tell your dentist you are receiving this medicine. Avoid taking products that contain aspirin, acetaminophen, ibuprofen, naproxen, or ketoprofen unless instructed by your doctor. These medicines may hide a fever. Do not become pregnant while taking this medicine. Women should inform their doctor if they wish to become pregnant or think they might be pregnant. There is a potential for serious side effects to an unborn child. Talk to your health care professional or pharmacist for more information. Do not breast-feed an infant while taking this medicine. Men should inform their doctor if they wish to father a child. This medicine may lower sperm counts. Do not treat diarrhea with over the counter products. Contact your doctor if you have diarrhea that lasts more than 2 days or if it is severe and watery. This medicine can make you more sensitive to the sun. Keep out of the sun. If you cannot avoid being in the sun, wear protective clothing and use sunscreen. Do not use sun lamps or tanning beds/booths. What side effects may I notice  from receiving this medicine? Side effects that you should report to your doctor or health care professional as soon as possible: -allergic reactions like skin rash, itching or hives, swelling of the face, lips, or tongue -low blood counts - this medicine may decrease the number of white blood cells, red blood cells and platelets. You may be at increased risk for infections and bleeding. -signs of infection - fever or chills, cough, sore throat, pain or difficulty passing urine -signs of decreased platelets or bleeding - bruising, pinpoint red spots on the skin, black, tarry stools, blood in the urine -signs of decreased red blood cells - unusually weak or tired, fainting spells, lightheadedness -breathing problems -changes in vision -chest pain -mouth sores -nausea and vomiting -pain, swelling, redness at site where injected -pain, tingling, numbness in the hands or feet -redness, swelling, or sores on hands or feet -stomach pain -unusual bleeding Side effects that usually do not require medical attention (report to your doctor or health care professional if they continue or are bothersome): -changes in finger or toe nails -diarrhea -dry or itchy skin -hair loss -headache -loss of appetite -sensitivity of eyes to the light -stomach upset -unusually teary eyes This list may not describe all possible side effects. Call your doctor for medical advice about side effects. You may report side effects to FDA at 1-800-FDA-1088. Where should I keep my medicine? This drug is given in a hospital or clinic and will not be stored at home. NOTE: This sheet is a summary. It may not cover all possible information. If you have questions about this medicine, talk to your doctor, pharmacist, or health care provider.    2016, Elsevier/Gold Standard. (2007-12-11 13:53:16) Ondansetron tablets What is this medicine? ONDANSETRON (on DAN se tron) is used to treat nausea and vomiting caused by  chemotherapy. It is also used to prevent or treat nausea and vomiting after surgery. This medicine may be  used for other purposes; ask your health care provider or pharmacist if you have questions. What should I tell my health care provider before I take this medicine? They need to know if you have any of these conditions: -heart disease -history of irregular heartbeat -liver disease -low levels of magnesium or potassium in the blood -an unusual or allergic reaction to ondansetron, granisetron, other medicines, foods, dyes, or preservatives -pregnant or trying to get pregnant -breast-feeding How should I use this medicine? Take this medicine by mouth with a glass of water. Follow the directions on your prescription label. Take your doses at regular intervals. Do not take your medicine more often than directed. Talk to your pediatrician regarding the use of this medicine in children. Special care may be needed. Overdosage: If you think you have taken too much of this medicine contact a poison control center or emergency room at once. NOTE: This medicine is only for you. Do not share this medicine with others. What if I miss a dose? If you miss a dose, take it as soon as you can. If it is almost time for your next dose, take only that dose. Do not take double or extra doses. What may interact with this medicine? Do not take this medicine with any of the following medications: -apomorphine -certain medicines for fungal infections like fluconazole, itraconazole, ketoconazole, posaconazole, voriconazole -cisapride -dofetilide -dronedarone -pimozide -thioridazine -ziprasidone This medicine may also interact with the following medications: -carbamazepine -certain medicines for depression, anxiety, or psychotic disturbances -fentanyl -linezolid -MAOIs like Carbex, Eldepryl, Marplan, Nardil, and Parnate -methylene blue (injected into a vein) -other medicines that prolong the QT interval  (cause an abnormal heart rhythm) -phenytoin -rifampicin -tramadol This list may not describe all possible interactions. Give your health care provider a list of all the medicines, herbs, non-prescription drugs, or dietary supplements you use. Also tell them if you smoke, drink alcohol, or use illegal drugs. Some items may interact with your medicine. What should I watch for while using this medicine? Check with your doctor or health care professional right away if you have any sign of an allergic reaction. What side effects may I notice from receiving this medicine? Side effects that you should report to your doctor or health care professional as soon as possible: -allergic reactions like skin rash, itching or hives, swelling of the face, lips or tongue -breathing problems -confusion -dizziness -fast or irregular heartbeat -feeling faint or lightheaded, falls -fever and chills -loss of balance or coordination -seizures -sweating -swelling of the hands or feet -tightness in the chest -tremors -unusually weak or tired Side effects that usually do not require medical attention (report to your doctor or health care professional if they continue or are bothersome): -constipation or diarrhea -headache This list may not describe all possible side effects. Call your doctor for medical advice about side effects. You may report side effects to FDA at 1-800-FDA-1088. Where should I keep my medicine? Keep out of the reach of children. Store between 2 and 30 degrees C (36 and 86 degrees F). Throw away any unused medicine after the expiration date. NOTE: This sheet is a summary. It may not cover all possible information. If you have questions about this medicine, talk to your doctor, pharmacist, or health care provider.    2016, Elsevier/Gold Standard. (2013-05-14 16:27:45) Prochlorperazine tablets What is this medicine? PROCHLORPERAZINE (proe klor PER a zeen) helps to control severe nausea and  vomiting. This medicine is also used to treat schizophrenia. It can  also help patients who experience anxiety that is not due to psychological illness. This medicine may be used for other purposes; ask your health care provider or pharmacist if you have questions. What should I tell my health care provider before I take this medicine? They need to know if you have any of these conditions: -blood disorders or disease -dementia -liver disease or jaundice -Parkinson's disease -uncontrollable movement disorder -an unusual or allergic reaction to prochlorperazine, other medicines, foods, dyes, or preservatives -pregnant or trying to get pregnant -breast-feeding How should I use this medicine? Take this medicine by mouth with a glass of water. Follow the directions on the prescription label. Take your doses at regular intervals. Do not take your medicine more often than directed. Do not stop taking this medicine suddenly. This can cause nausea, vomiting, and dizziness. Ask your doctor or health care professional for advice. Talk to your pediatrician regarding the use of this medicine in children. Special care may be needed. While this drug may be prescribed for children as young as 2 years for selected conditions, precautions do apply. Overdosage: If you think you have taken too much of this medicine contact a poison control center or emergency room at once. NOTE: This medicine is only for you. Do not share this medicine with others. What if I miss a dose? If you miss a dose, take it as soon as you can. If it is almost time for your next dose, take only that dose. Do not take double or extra doses. What may interact with this medicine? Do not take this medicine with any of the following medications: -amoxapine -antidepressants like citalopram, escitalopram, fluoxetine, paroxetine, and sertraline -deferoxamine -dofetilide -maprotiline -tricyclic antidepressants like amitriptyline, clomipramine,  imipramine, nortiptyline and others This medicine may also interact with the following medications: -lithium -medicines for pain -phenytoin -propranolol -warfarin This list may not describe all possible interactions. Give your health care provider a list of all the medicines, herbs, non-prescription drugs, or dietary supplements you use. Also tell them if you smoke, drink alcohol, or use illegal drugs. Some items may interact with your medicine. What should I watch for while using this medicine? Visit your doctor or health care professional for regular checks on your progress. You may get drowsy or dizzy. Do not drive, use machinery, or do anything that needs mental alertness until you know how this medicine affects you. Do not stand or sit up quickly, especially if you are an older patient. This reduces the risk of dizzy or fainting spells. Alcohol may interfere with the effect of this medicine. Avoid alcoholic drinks. This medicine can reduce the response of your body to heat or cold. Dress warm in cold weather and stay hydrated in hot weather. If possible, avoid extreme temperatures like saunas, hot tubs, very hot or cold showers, or activities that can cause dehydration such as vigorous exercise. This medicine can make you more sensitive to the sun. Keep out of the sun. If you cannot avoid being in the sun, wear protective clothing and use sunscreen. Do not use sun lamps or tanning beds/booths. Your mouth may get dry. Chewing sugarless gum or sucking hard candy, and drinking plenty of water may help. Contact your doctor if the problem does not go away or is severe. What side effects may I notice from receiving this medicine? Side effects that you should report to your doctor or health care professional as soon as possible: -blurred vision -breast enlargement in men or women -breast milk in  women who are not breast-feeding -chest pain, fast or irregular heartbeat -confusion,  restlessness -dark yellow or brown urine -difficulty breathing or swallowing -dizziness or fainting spells -drooling, shaking, movement difficulty (shuffling walk) or rigidity -fever, chills, sore throat -involuntary or uncontrollable movements of the eyes, mouth, head, arms, and legs -seizures -stomach area pain -unusually weak or tired -unusual bleeding or bruising -yellowing of skin or eyes Side effects that usually do not require medical attention (report to your doctor or health care professional if they continue or are bothersome): -difficulty passing urine -difficulty sleeping -headache -sexual dysfunction -skin rash, or itching This list may not describe all possible side effects. Call your doctor for medical advice about side effects. You may report side effects to FDA at 1-800-FDA-1088. Where should I keep my medicine? Keep out of the reach of children. Store at room temperature between 15 and 30 degrees C (59 and 86 degrees F). Protect from light. Throw away any unused medicine after the expiration date. NOTE: This sheet is a summary. It may not cover all possible information. If you have questions about this medicine, talk to your doctor, pharmacist, or health care provider.    2016, Elsevier/Gold Standard. (2011-12-26 16:59:39)

## 2015-12-24 ENCOUNTER — Ambulatory Visit (HOSPITAL_COMMUNITY): Admission: RE | Admit: 2015-12-24 | Payer: BLUE CROSS/BLUE SHIELD | Source: Ambulatory Visit

## 2015-12-24 ENCOUNTER — Encounter (HOSPITAL_COMMUNITY): Payer: BLUE CROSS/BLUE SHIELD | Attending: Hematology & Oncology

## 2015-12-24 DIAGNOSIS — F329 Major depressive disorder, single episode, unspecified: Secondary | ICD-10-CM | POA: Insufficient documentation

## 2015-12-24 DIAGNOSIS — F419 Anxiety disorder, unspecified: Secondary | ICD-10-CM | POA: Insufficient documentation

## 2015-12-24 DIAGNOSIS — C20 Malignant neoplasm of rectum: Secondary | ICD-10-CM

## 2015-12-24 DIAGNOSIS — Z9221 Personal history of antineoplastic chemotherapy: Secondary | ICD-10-CM | POA: Insufficient documentation

## 2015-12-27 ENCOUNTER — Inpatient Hospital Stay (HOSPITAL_COMMUNITY): Payer: BLUE CROSS/BLUE SHIELD

## 2015-12-29 ENCOUNTER — Encounter (HOSPITAL_COMMUNITY): Payer: BLUE CROSS/BLUE SHIELD

## 2015-12-31 ENCOUNTER — Other Ambulatory Visit (HOSPITAL_COMMUNITY): Payer: Self-pay | Admitting: *Deleted

## 2015-12-31 DIAGNOSIS — C20 Malignant neoplasm of rectum: Secondary | ICD-10-CM

## 2016-01-03 ENCOUNTER — Ambulatory Visit (HOSPITAL_COMMUNITY): Payer: BLUE CROSS/BLUE SHIELD | Admitting: Oncology

## 2016-01-03 ENCOUNTER — Inpatient Hospital Stay (HOSPITAL_COMMUNITY): Payer: BLUE CROSS/BLUE SHIELD

## 2016-01-03 ENCOUNTER — Ambulatory Visit (HOSPITAL_COMMUNITY)
Admission: RE | Admit: 2016-01-03 | Discharge: 2016-01-03 | Disposition: A | Discharge status: Home / Self Care | Payer: BLUE CROSS/BLUE SHIELD | Source: Ambulatory Visit | Attending: Hematology & Oncology | Admitting: Hematology & Oncology

## 2016-01-03 DIAGNOSIS — C20 Malignant neoplasm of rectum: Secondary | ICD-10-CM | POA: Diagnosis not present

## 2016-01-03 MED ORDER — SODIUM CHLORIDE 0.9% FLUSH
10.0000 mL | INTRAVENOUS | Status: DC | PRN
Start: 2016-01-03 — End: 2016-01-04

## 2016-01-03 MED ORDER — SODIUM CHLORIDE 0.9% FLUSH: 10.0000 mL | Freq: Two times a day (BID) | INTRAVENOUS | Status: DC

## 2016-01-03 NOTE — Discharge Instructions (Signed)
Peripherally Inserted Central Catheter/Midline Placement  The IV Nurse has discussed with the patient and/or persons authorized to consent for the patient, the purpose of this procedure and the potential benefits and risks involved with this procedure.  The benefits include less needle sticks, lab draws from the catheter and patient may be discharged home with the catheter.  Risks include, but not limited to, infection, bleeding, blood clot (thrombus formation), and puncture of an artery; nerve damage and irregular heat beat.  Alternatives to this procedure were also discussed.  PICC/Midline Placement Documentation  PICC Single Lumen [id number] PICC Right Basilic 40 cm 0 cm (Active)  Indication for Insertion or Continuance of Line Prolonged intravenous therapies 01/03/2016  3:37 PM  Exposed Catheter (cm) 0 cm 01/03/2016  3:37 PM  Site Assessment Clean;Dry;Intact 01/03/2016  3:37 PM  Line Status Flushed;Saline locked 01/03/2016  3:37 PM  Dressing Type Transparent 01/03/2016  3:37 PM  Dressing Status Clean;Dry;Intact;Antimicrobial disc in place 01/03/2016  3:37 PM  Line Care Connections checked and tightened 01/03/2016  3:37 PM  Dressing Intervention New dressing 01/03/2016  3:37 PM  Dressing Change Due 01/10/16 01/03/2016  3:37 PM       Hillery Jacks 01/03/2016, 3:39 PM   PICC Insertion, Care After Refer to this sheet in the next few weeks. These instructions provide you with information on caring for yourself after your procedure. Your health care provider may also give you more specific instructions. Your treatment has been planned according to current medical practices, but problems sometimes occur. Call your health care provider if you have any problems or questions after your procedure. WHAT TO EXPECT AFTER THE PROCEDURE After your procedure, it is typical to have the following:  Mild discomfort at the insertion site. This should not last more than a day. HOME CARE INSTRUCTIONS  Rest at  home for the remainder of the day after the procedure.  You may bend your arm and move it freely. If your PICC is near or at the bend of your elbow, avoid activity with repeated motion at the elbow.  Avoid lifting heavy objects as instructed by your health care provider.  Avoid using a crutch with the arm on the same side as your PICC. You may need to use a walker. Bandage Care  Keep your PICC bandage (dressing) clean and dry to prevent infection.  Ask your health care provider when you may shower. To keep the dressing dry, cover the PICC with plastic wrap and tape before showering. If the dressing does become wet, replace it right after the shower.  Do not soak in the bath, swim, or use hot tubs when you have a PICC.  Change the PICC dressing as instructed by your health care provider.  Change your PICC dressing if it becomes loose or wet. General PICC Care  Check the PICC insertion site daily for leakage, redness, swelling, or pain.  Flush the PICC as directed by your health care provider. Let your health care provider know right away if the PICC is difficult to flush or does not flush. Do not use force to flush the PICC.  Do not use a syringe that is less than 10 mL to flush the PICC.  Never pull or tug on the PICC.  Avoid blood pressure checks on the arm with the PICC.  Keep your PICC identification card with you at all times.  Do not take the PICC out yourself. Only a trained health care professional should remove the PICC. [place]  CARE IF:  You have pain in your arm, ear, face, or teeth.  You have fever or chills.  You have drainage from the PICC insertion site.  You have redness or palpate a "cord" around the PICC insertion site.  You cannot flush the catheter. SEEK IMMEDIATE MEDICAL CARE IF:  You have swelling in the arm in which the PICC is inserted.   This information is not intended to replace advice given to you by your health care provider. Make sure  you discuss any questions you have with your health care provider.   Document Released: 05/28/2013 Document Revised: 08/12/2013 Document Reviewed: 05/28/2013 Elsevier Interactive Patient Education [employer].

## 2016-01-03 NOTE — Progress Notes (Signed)
Peripherally Inserted Central Catheter/Midline Placement  The IV Nurse has discussed with the patient and/or persons authorized to consent for the patient, the purpose of this procedure and the potential benefits and risks involved with this procedure.  The benefits include less needle sticks, lab draws from the catheter and patient may be discharged home with the catheter.  Risks include, but not limited to, infection, bleeding, blood clot (thrombus formation), and puncture of an artery; nerve damage and irregular heat beat.  Alternatives to this procedure were also discussed.  PICC/Midline Placement Documentation  PICC Single Lumen [id number] PICC Right Basilic 40 cm 0 cm (Active)  Indication for Insertion or Continuance of Line Prolonged intravenous therapies 01/03/2016  3:37 PM  Exposed Catheter (cm) 0 cm 01/03/2016  3:37 PM  Site Assessment Clean;Dry;Intact 01/03/2016  3:37 PM  Line Status Flushed;Saline locked 01/03/2016  3:37 PM  Dressing Type Transparent 01/03/2016  3:37 PM  Dressing Status Clean;Dry;Intact;Antimicrobial disc in place 01/03/2016  3:37 PM  Line Care Connections checked and tightened 01/03/2016  3:37 PM  Dressing Intervention New dressing 01/03/2016  3:37 PM  Dressing Change Due 01/10/16 01/03/2016  3:37 PM       Hillery Jacks 01/03/2016, 3:38 PM

## 2016-01-03 NOTE — Progress Notes (Signed)
[place] [facility], [place] [id number]  Rectal cancer [facility]) - Plan: ondansetron (ZOFRAN) 8 MG tablet, prochlorperazine (COMPAZINE) 10 MG tablet  CURRENT THERAPY: Beginning FOLFOX chemotherapy today, 01/04/2016.  INTERVAL HISTORY: [facility] 50 y.o. 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  Now undergoing systemic chemotherapy consisting of FOLFOX beginning on 01/04/2016 x 6 cycles.  Oncology History   Stage IIIB rectal adenocarcinoma, mT2N2M0 Treated with 5Gy x 5 at [facility]     Rectal cancer [facility])   09/20/2015 Procedure colonoscopy with firm rectal mass and 4 cm polyp in proximal rectum, infiltrating non-obstructing large mass within the distal rectum extending towards anal verge   09/20/2015 Miscellaneous Microsatellite stable.    09/28/2015 Imaging CT C/A/P no evidence of distant metastatic disease. irregular thickening of the low rectum and anal canal, several enlarged perirectal LN, indeterminate 77m RUL nodule   09/28/2015 Imaging MRI pelvis assym diffuse circum lobular thickening of rectum, near full wall thickness extension along lower anterior rectum W/O extra serosal extension c/w known adeno, mult. perirectal LN   12/06/2015 - 12/10/2015 Radiation Therapy [facility] Dr. AAngelina Ok  2500.00 cGy, 500.00 cGy per day in 5 fractions delivered 1x per day to the 96.0% Isodose line Treatment Dates: 12/06/2015 through 12/10/2015.    01/03/2016 Procedure PICC placed   01/04/2016 -  Chemotherapy FOLFOX x 6 cycles    I personally reviewed and went over laboratory results with the patient.  The results are noted within this dictation.  Labs will be updated today.  He did complete XRT ~ 1 month ago and therefore, we will confirm count recovery prior to initiating cycle 1 of treatment.  He reports that  he lost his antiemetics. These were prescribed on 12/16/2015 #30 with 2 refills for both Compazine and Zofran. I've represcribed both medications to [facility] He should be able to pick these up today.  He had questions about cold intolerance, chemotherapy-induced neutropenia, and recommendations regarding sick contacts. He provided education regarding all 3 issues and he is advised to avoid known sick people. He is educated on good hand hygiene with frequent washings and sensitization.  Review of Systems  Constitutional: Negative for fever, chills and weight loss.  HENT: Negative.  Negative for sore throat.   Eyes: Negative.   Respiratory: Negative.  Negative for cough, sputum production and shortness of breath.   Cardiovascular: Negative.  Negative for chest pain and leg swelling.  Gastrointestinal: Negative.  Negative for nausea, vomiting and abdominal pain.  Genitourinary: Negative.  Negative for dysuria and hematuria.  Musculoskeletal: Negative.  Negative for falls.  Skin: Negative.   Neurological: Negative.  Negative for weakness.  Endo/Heme/Allergies: Negative.   Psychiatric/Behavioral: Negative.     Past Medical History  Diagnosis Date  . Depression   . Anxiety     History reviewed. No pertinent past surgical history.  History reviewed. No pertinent family history.  Social History   Social History  . Marital Status: Divorced    Spouse Name: [name]  . Number of Children: [name]  . Years of Education: [name]   Social History Main Topics  . Smoking status: Never Smoker   . Smokeless tobacco: None  . Alcohol Use: Yes     Comment: occ.   . Drug Use: No  . Sexual Activity:  Not Asked   Other Topics Concern  . None   Social History Narrative     PHYSICAL EXAMINATION  ECOG PERFORMANCE STATUS: 1 - Symptomatic but completely ambulatory  Filed Vitals:   01/04/16 0924  BP: 130/80  Pulse: 87  Temp: 98.5 F (36.9 C)  Resp: 18    GENERAL:alert, no distress,  well nourished, well developed, comfortable, cooperative, smiling and accompanied by [name] SKIN: skin color, texture, turgor are normal, no rashes or significant lesions HEAD: Normocephalic, No masses, lesions, tenderness or abnormalities EYES: normal, EOMI, Conjunctiva are pink and non-injected EARS: External ears normal OROPHARYNX:lips, buccal mucosa, and tongue normal and mucous membranes are moist  NECK: supple, no adenopathy, thyroid normal size, non-tender, without nodularity, trachea midline LYMPH:  no palpable lymphadenopathy BREAST:not examined LUNGS: clear to auscultation  HEART: regular rate & rhythm, no murmurs, no gallops, S1 normal and S2 normal ABDOMEN:abdomen soft and normal bowel sounds BACK: Back symmetric, no curvature. EXTREMITIES:less then 2 second capillary refill, no joint deformities, effusion, or inflammation, no edema, no skin discoloration, no cyanosis.  PICC in right upper arm, cleanly dressed. NEURO: alert & oriented x 3 with fluent speech, no focal motor/sensory deficits, gait normal   LABORATORY DATA: CBC No results found for: WBC, RBC, HGB, HCT, PLT, MCV, MCH, MCHC, RDW, LYMPHSABS, MONOABS, EOSABS, BASOSABS    Chemistry   No results found for: NA, K, CL, CO2, BUN, CREATININE, GLU No results found for: CALCIUM, ALKPHOS, AST, ALT, BILITOT      PENDING LABS:   RADIOGRAPHIC STUDIES:  No results found.   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  Now undergoing systemic chemotherapy consisting of FOLFOX beginning on 01/04/2016 x 6 cycles.  Oncology history is updated.  PICC line placed yesterday, 01/03/2016.  Pre-treatment labs today: CBC diff, CMET.  He notes that he lost his Zofran and Compazine prescriptions. I will re-prescribe these medications to [place].  He knows to call us with any issues secondary to chemotherapy treatment and/or malignancy. He knows to call today side effects as well. Historically he's been difficult to get in touch with the telephone and therefore he is educated on the importance of keeping in contact with [place], particularly with any issues associated with chemotherapy.  Return as scheduled for follow-up.    ORDERS PLACED FOR THIS ENCOUNTER: No orders of the defined types were placed in this encounter.    MEDICATIONS PRESCRIBED THIS ENCOUNTER: Meds ordered this encounter  Medications  . oxyCODONE (OXY IR/ROXICODONE) 5 MG immediate release tablet    Sig: TAKE 1 TO 2 TABLETS PO Q 6 H PRN P    Refill:  0  . ondansetron (ZOFRAN) 8 MG tablet    Sig: Take 1 tablet (8 mg total) by mouth every 8 (eight) hours as needed for nausea or vomiting.    Dispense:  30 tablet    Refill:  2    Order Specific Question:  Supervising Provider    Answer:  Patrici Ranks [id number]  . prochlorperazine (COMPAZINE) 10 MG tablet    Sig: Take 1 tablet (10 mg total) by mouth every 6 (six) hours as needed for nausea or vomiting.    Dispense:  30 tablet    Refill:  2    Order Specific Question:  Supervising Provider    Answer:  Patrici Ranks [[id number]]    THERAPY PLAN:  Continue with FOLFOX, beginning today, x 6 cycles per recommendations by Medical Oncology at [facility].  All questions were answered. The patient knows to call the clinic with any problems, questions or concerns. We can certainly see the patient much sooner if necessary.  Patient and plan discussed with Dr. Ancil Linsey and she is in agreement with the aforementioned.   This note is electronically signed by: Robynn Pane, PA-C 01/04/2016 10:00 AM

## 2016-01-03 NOTE — Assessment & Plan Note (Addendum)
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  Now undergoing systemic chemotherapy consisting of FOLFOX beginning on 01/04/2016 x 6 cycles.  Oncology history is updated.  PICC line placed yesterday, 01/03/2016.  Pre-treatment labs today: CBC diff, CMET.  He notes that he lost his Zofran and Compazine prescriptions. I will re-prescribe these medications to [place].  He knows to call us with any issues secondary to chemotherapy treatment and/or malignancy. He knows to call today side effects as well. Historically he's been difficult to get in touch with the telephone and therefore he is educated on the importance of keeping in contact with [place], particularly with any issues associated with chemotherapy.  Return as scheduled for follow-up.

## 2016-01-04 ENCOUNTER — Encounter (HOSPITAL_COMMUNITY): Payer: Self-pay | Admitting: Oncology

## 2016-01-04 ENCOUNTER — Encounter: Payer: Self-pay | Admitting: *Deleted

## 2016-01-04 ENCOUNTER — Encounter (HOSPITAL_BASED_OUTPATIENT_CLINIC_OR_DEPARTMENT_OTHER): Payer: BLUE CROSS/BLUE SHIELD | Admitting: Oncology

## 2016-01-04 ENCOUNTER — Encounter (HOSPITAL_BASED_OUTPATIENT_CLINIC_OR_DEPARTMENT_OTHER): Payer: BLUE CROSS/BLUE SHIELD

## 2016-01-04 VITALS — BP 116/75 | HR 93 | Temp 98.4°F | Resp 18

## 2016-01-04 VITALS — BP 130/80 | HR 87 | Temp 98.5°F | Resp 18 | Wt 168.3 lb

## 2016-01-04 DIAGNOSIS — C20 Malignant neoplasm of rectum: Secondary | ICD-10-CM

## 2016-01-04 DIAGNOSIS — F329 Major depressive disorder, single episode, unspecified: Secondary | ICD-10-CM | POA: Diagnosis not present

## 2016-01-04 DIAGNOSIS — Z9221 Personal history of antineoplastic chemotherapy: Secondary | ICD-10-CM | POA: Diagnosis not present

## 2016-01-04 DIAGNOSIS — F419 Anxiety disorder, unspecified: Secondary | ICD-10-CM | POA: Diagnosis not present

## 2016-01-04 DIAGNOSIS — Z5111 Encounter for antineoplastic chemotherapy: Secondary | ICD-10-CM | POA: Diagnosis not present

## 2016-01-04 LAB — COMPREHENSIVE METABOLIC PANEL
ALBUMIN: 4.1 g/dL (ref 3.5–5.0)
ALT: 31 U/L (ref 17–63)
AST: 24 U/L (ref 15–41)
Alkaline Phosphatase: 81 U/L (ref 38–126)
Anion gap: 7 (ref 5–15)
BUN: 12 mg/dL (ref 6–20)
CHLORIDE: 104 mmol/L (ref 101–111)
CO2: 26 mmol/L (ref 22–32)
CREATININE: 0.86 mg/dL (ref 0.61–1.24)
Calcium: 9.6 mg/dL (ref 8.9–10.3)
GFR calc Af Amer: >60 mL/min (ref >60)
GLUCOSE: 87 mg/dL (ref 65–99)
POTASSIUM: 4.2 mmol/L (ref 3.5–5.1)
Sodium: 137 mmol/L (ref 135–145)
Total Bilirubin: 0.4 mg/dL (ref 0.3–1.2)
Total Protein: 7.8 g/dL (ref 6.5–8.1)

## 2016-01-04 LAB — CBC WITH DIFFERENTIAL/PLATELET
BASOS ABS: 0.1 10*3/uL (ref 0.0–0.1)
BASOS PCT: 1 %
EOS PCT: 3 %
Eosinophils Absolute: 0.2 10*3/uL (ref 0.0–0.7)
HEMATOCRIT: 43.4 % (ref 39.0–52.0)
Hemoglobin: 13.8 g/dL (ref 13.0–17.0)
LYMPHS PCT: 16 %
Lymphs Abs: 1.2 10*3/uL (ref 0.7–4.0)
MCH: 27.9 pg (ref 26.0–34.0)
MCHC: 31.8 g/dL (ref 30.0–36.0)
MCV: 87.9 fL (ref 78.0–100.0)
MONO ABS: 1.1 10*3/uL — AB (ref 0.1–1.0)
Monocytes Relative: 16 %
NEUTROS ABS: 4.7 10*3/uL (ref 1.7–7.7)
Neutrophils Relative %: 64 %
PLATELETS: 267 10*3/uL (ref 150–400)
RBC: 4.94 MIL/uL (ref 4.22–5.81)
RDW: 15.5 % (ref 11.5–15.5)
WBC: 7.3 10*3/uL (ref 4.0–10.5)

## 2016-01-04 MED ORDER — LEUCOVORIN CALCIUM INJECTION 350 MG
400.0000 mg/m2 | Freq: Once | INTRAVENOUS | Status: AC
  Administered 2016-01-04: 772 mg via INTRAVENOUS
  Filled 2016-01-04: qty 10

## 2016-01-04 MED ORDER — OXALIPLATIN CHEMO INJECTION 100 MG/20ML
85.0000 mg/m2 | Freq: Once | INTRAVENOUS | Status: AC
  Administered 2016-01-04: 165 mg via INTRAVENOUS
  Filled 2016-01-04: qty 33

## 2016-01-04 MED ORDER — SODIUM CHLORIDE 0.9 % IV SOLN
2400.0000 mg/m2 | INTRAVENOUS | Status: DC
  Administered 2016-01-04: 4650 mg via INTRAVENOUS
  Filled 2016-01-04: qty 93

## 2016-01-04 MED ORDER — PALONOSETRON HCL INJECTION 0.25 MG/5ML
0.2500 mg | Freq: Once | INTRAVENOUS | Status: AC
  Administered 2016-01-04: 0.25 mg via INTRAVENOUS

## 2016-01-04 MED ORDER — SODIUM CHLORIDE 0.9% FLUSH: 10.0000 mL | INTRAVENOUS | Status: DC | PRN

## 2016-01-04 MED ORDER — HEPARIN SOD (PORK) LOCK FLUSH 100 UNIT/ML IV SOLN: 500.0000 [IU] | Freq: Once | INTRAVENOUS | Status: DC | PRN

## 2016-01-04 MED ORDER — SODIUM CHLORIDE 0.9 % IV SOLN
10.0000 mg | Freq: Once | INTRAVENOUS | Status: AC
  Administered 2016-01-04: 10 mg via INTRAVENOUS
  Filled 2016-01-04: qty 1

## 2016-01-04 MED ORDER — ONDANSETRON HCL 8 MG PO TABS
8.0000 mg | ORAL_TABLET | Freq: Three times a day (TID) | ORAL | Status: DC | PRN
Start: 2016-01-04 — End: 2016-06-19

## 2016-01-04 MED ORDER — PROCHLORPERAZINE MALEATE 10 MG PO TABS: 10.0000 mg | ORAL_TABLET | Freq: Four times a day (QID) | ORAL | Status: DC | PRN

## 2016-01-04 MED ORDER — DEXTROSE 5 % IV SOLN
Freq: Once | INTRAVENOUS | Status: AC
  Administered 2016-01-04: 10:00:00 via INTRAVENOUS

## 2016-01-04 MED ORDER — FLUOROURACIL CHEMO INJECTION 2.5 GM/50ML
400.0000 mg/m2 | Freq: Once | INTRAVENOUS | Status: AC
  Administered 2016-01-04: 750 mg via INTRAVENOUS
  Filled 2016-01-04: qty 15

## 2016-01-04 NOTE — Progress Notes (Signed)
[place] Clinical Social Work  Clinical Social Work was referred by patient navigator for assessment of psychosocial needs due to new cancer diagnosis, starting treatment. Clinical Social Worker met with patient at [facility] to offer support and assess for needs.  CSW reviewed role of CSW, Support Programs/resources to assist pt and caregivers through their cancer journey. Pt very open with CSW about what he had experienced thus far, his coping techniques and support from friends, family and children. Pt works in [place] and plans to take time off, but is hopeful he can continue working some from home. Pt very interested in [employer], painting classes. CSW provided handouts on all resources/supports, encouraged pt and friend, Tarri Fuller to reach out as needed.     Clinical Social Work interventions: Resource education Supportive listening  [place], [place] Tuesdays   [phone]

## 2016-01-04 NOTE — Progress Notes (Signed)
Okay to treat today prior to lab results per T. Kefalas, PA - Day 1 Cycle 1.  1345:  Tolerated tx w/o adverse reaction.  VSS, in no distress.  Discharged ambulatory in c/o friend for transport home.

## 2016-01-04 NOTE — Progress Notes (Signed)
Pt had chemo teaching on 01/04/16 (first day of chemo by Anastasio Champion RN). Consent should have been obtained by [facility].

## 2016-01-04 NOTE — Patient Instructions (Signed)
[facility] Discharge Instructions for Patients Receiving Chemotherapy   Beginning January 23rd 2017 lab work for the [facility] will be done in the  Main lab at [facility] on 1st floor. If you have a lab appointment with the [facility] please come in thru the  Main Entrance and check in at the main information desk   Today you received the following chemotherapy agents:  Oxaliplatin, Leucovorin, and 5FU  Take your nausea medication as prescribed. If you develop nausea and vomiting, or diarrhea that is not controlled by your medication, call the clinic.  The clinic phone number is [phone]. Office hours are Monday-Friday 8:30am-5:00pm.  BELOW ARE SYMPTOMS THAT SHOULD BE REPORTED IMMEDIATELY:  *FEVER GREATER THAN 101.0 F  *CHILLS WITH OR WITHOUT FEVER  NAUSEA AND VOMITING THAT IS NOT CONTROLLED WITH YOUR NAUSEA MEDICATION  *UNUSUAL SHORTNESS OF BREATH  *UNUSUAL BRUISING OR BLEEDING  TENDERNESS IN MOUTH AND THROAT WITH OR WITHOUT PRESENCE OF ULCERS  *URINARY PROBLEMS  *BOWEL PROBLEMS  UNUSUAL RASH Items with * indicate a potential emergency and should be followed up as soon as possible. If you have an emergency after office hours please contact your primary care physician or go to the nearest emergency department.  Please call the clinic during office hours if you have any questions or concerns.   You may also contact the Patient Navigator at [phone] should you have any questions or need assistance in obtaining follow up care.      Resources For Cancer Patients and their Caregivers ? American Cancer Society: Can assist with transportation, wigs, general needs, runs Look Good Feel Better.        787-550-2865 ? Cancer Care: Provides financial assistance, online support groups, medication/co-pay assistance.  1-800-813-HOPE 229 335 7750) ? [place] Assists [place] cancer patients and their families through  emotional , educational and financial support.  ([phone] 844-1937 ? [place] DSS Where to apply for food stamps, Medicaid and utility assistance. 984-694-6889 ? RCATS: Transportation to medical appointments. (410)547-0698 ? Social Security Administration: May apply for disability if have a Stage IV cancer. [phone] [phone] [phone] ? [employer], Disability and Transit Services: Assists with nutrition, care and transit needs. [phone]        Oxaliplatin Injection What is this medicine? OXALIPLATIN (ox AL i PLA tin) is a chemotherapy drug. It targets fast dividing cells, like cancer cells, and causes these cells to die. This medicine is used to treat cancers of the colon and rectum, and many other cancers. This medicine may be used for other purposes; ask your health care provider or pharmacist if you have questions. What should I tell my health care provider before I take this medicine? They need to know if you have any of these conditions: -kidney disease -an unusual or allergic reaction to oxaliplatin, other chemotherapy, other medicines, foods, dyes, or preservatives -pregnant or trying to get pregnant -breast-feeding How should I use this medicine? This drug is given as an infusion into a vein. It is administered in a hospital or clinic by a specially trained health care professional. Talk to your pediatrician regarding the use of this medicine in children. Special care may be needed. Overdosage: If you think you have taken too much of this medicine contact a poison control center or emergency room at once. NOTE: This medicine is only for you. Do not share this medicine with others. What if I miss a dose? It is important not to miss a  dose. Call your doctor or health care professional if you are unable to keep an appointment. What may interact with this medicine? -medicines to increase blood counts like filgrastim, pegfilgrastim, sargramostim -probenecid -some  antibiotics like amikacin, gentamicin, neomycin, polymyxin B, streptomycin, tobramycin -zalcitabine Talk to your doctor or health care professional before taking any of these medicines: -acetaminophen -aspirin -ibuprofen -ketoprofen -naproxen This list may not describe all possible interactions. Give your health care provider a list of all the medicines, herbs, non-prescription drugs, or dietary supplements you use. Also tell them if you smoke, drink alcohol, or use illegal drugs. Some items may interact with your medicine. What should I watch for while using this medicine? Your condition will be monitored carefully while you are receiving this medicine. You will need important blood work done while you are taking this medicine. This medicine can make you more sensitive to cold. Do not drink cold drinks or use ice. Cover exposed skin before coming in contact with cold temperatures or cold objects. When out in cold weather wear warm clothing and cover your mouth and nose to warm the air that goes into your lungs. Tell your doctor if you get sensitive to the cold. This drug may make you feel generally unwell. This is not uncommon, as chemotherapy can affect healthy cells as well as cancer cells. Report any side effects. Continue your course of treatment even though you feel ill unless your doctor tells you to stop. In some cases, you may be given additional medicines to help with side effects. Follow all directions for their use. Call your doctor or health care professional for advice if you get a fever, chills or sore throat, or other symptoms of a cold or flu. Do not treat yourself. This drug decreases your body's ability to fight infections. Try to avoid being around people who are sick. This medicine may increase your risk to bruise or bleed. Call your doctor or health care professional if you notice any unusual bleeding. Be careful brushing and flossing your teeth or using a toothpick because you  may get an infection or bleed more easily. If you have any dental work done, tell your dentist you are receiving this medicine. Avoid taking products that contain aspirin, acetaminophen, ibuprofen, naproxen, or ketoprofen unless instructed by your doctor. These medicines may hide a fever. Do not become pregnant while taking this medicine. Women should inform their doctor if they wish to become pregnant or think they might be pregnant. There is a potential for serious side effects to an unborn child. Talk to your health care professional or pharmacist for more information. Do not breast-feed an infant while taking this medicine. Call your doctor or health care professional if you get diarrhea. Do not treat yourself. What side effects may I notice from receiving this medicine? Side effects that you should report to your doctor or health care professional as soon as possible: -allergic reactions like skin rash, itching or hives, swelling of the face, lips, or tongue -low blood counts - This drug may decrease the number of white blood cells, red blood cells and platelets. You may be at increased risk for infections and bleeding. -signs of infection - fever or chills, cough, sore throat, pain or difficulty passing urine -signs of decreased platelets or bleeding - bruising, pinpoint red spots on the skin, black, tarry stools, nosebleeds -signs of decreased red blood cells - unusually weak or tired, fainting spells, lightheadedness -breathing problems -chest pain, pressure -cough -diarrhea -jaw tightness -mouth  sores -nausea and vomiting -pain, swelling, redness or irritation at the injection site -pain, tingling, numbness in the hands or feet -problems with balance, talking, walking -redness, blistering, peeling or loosening of the skin, including inside the mouth -trouble passing urine or change in the amount of urine Side effects that usually do not require medical attention (report to your doctor  or health care professional if they continue or are bothersome): -changes in vision -constipation -hair loss -loss of appetite -metallic taste in the mouth or changes in taste -stomach pain This list may not describe all possible side effects. Call your doctor for medical advice about side effects. You may report side effects to FDA at 1-800-FDA-1088. Where should I keep my medicine? This drug is given in a hospital or clinic and will not be stored at home. NOTE: This sheet is a summary. It may not cover all possible information. If you have questions about this medicine, talk to your doctor, pharmacist, or health care provider.    2016, Elsevier/Gold Standard. (2008-03-03 17:22:47)  Leucovorin injection What is this medicine? LEUCOVORIN (loo koe VOR in) is used to prevent or treat the harmful effects of some medicines. This medicine is used to treat anemia caused by a low amount of folic acid in the body. It is also used with 5-fluorouracil (5-FU) to treat colon cancer. This medicine may be used for other purposes; ask your health care provider or pharmacist if you have questions. What should I tell my health care provider before I take this medicine? They need to know if you have any of these conditions: -anemia from low levels of vitamin B-12 in the blood -an unusual or allergic reaction to leucovorin, folic acid, other medicines, foods, dyes, or preservatives -pregnant or trying to get pregnant -breast-feeding How should I use this medicine? This medicine is for injection into a muscle or into a vein. It is given by a health care professional in a hospital or clinic setting. Talk to your pediatrician regarding the use of this medicine in children. Special care may be needed. Overdosage: If you think you have taken too much of this medicine contact a poison control center or emergency room at once. NOTE: This medicine is only for you. Do not share this medicine with others. What if I  miss a dose? This does not apply. What may interact with this medicine? -capecitabine -fluorouracil -phenobarbital -phenytoin -primidone -trimethoprim-sulfamethoxazole This list may not describe all possible interactions. Give your health care provider a list of all the medicines, herbs, non-prescription drugs, or dietary supplements you use. Also tell them if you smoke, drink alcohol, or use illegal drugs. Some items may interact with your medicine. What should I watch for while using this medicine? Your condition will be monitored carefully while you are receiving this medicine. This medicine may increase the side effects of 5-fluorouracil, 5-FU. Tell your doctor or health care professional if you have diarrhea or mouth sores that do not get better or that get worse. What side effects may I notice from receiving this medicine? Side effects that you should report to your doctor or health care professional as soon as possible: -allergic reactions like skin rash, itching or hives, swelling of the face, lips, or tongue -breathing problems -fever, infection -mouth sores -unusual bleeding or bruising -unusually weak or tired Side effects that usually do not require medical attention (report to your doctor or health care professional if they continue or are bothersome): -constipation or diarrhea -loss of appetite -nausea,  vomiting This list may not describe all possible side effects. Call your doctor for medical advice about side effects. You may report side effects to FDA at 1-800-FDA-1088. Where should I keep my medicine? This drug is given in a hospital or clinic and will not be stored at home. NOTE: This sheet is a summary. It may not cover all possible information. If you have questions about this medicine, talk to your doctor, pharmacist, or health care provider.    2016, Elsevier/Gold Standard. (2008-02-11 16:50:29)  Fluorouracil, 5FU; Diclofenac topical cream What is this  medicine? FLUOROURACIL; DICLOFENAC (flure oh YOOR a sil; dye KLOE fen ak) is a combination of a topical chemotherapy agent and non-steroidal anti-inflammatory drug (NSAID). It is used on the skin to treat skin cancer and skin conditions that could become cancer. This medicine may be used for other purposes; ask your health care provider or pharmacist if you have questions. What should I tell my health care provider before I take this medicine? They need to know if you have any of these conditions: -bleeding problems -cigarette smoker -DPD enzyme deficiency -heart disease -high blood pressure -if you frequently drink alcohol containing drinks -kidney disease -liver disease -open or infected skin -stomach problems -swelling or open sores at the treatment site -recent or planned coronary artery bypass graft (CABG) surgery -an unusual or allergic reaction to fluorouracil, diclofenac, aspirin, other NSAIDs, other medicines, foods, dyes, or preservatives -pregnant or trying to get pregnant -breast-feeding How should I use this medicine? This medicine is only for use on the skin. Follow the directions on the prescription label. Wash hands before and after use. Wash affected area and gently pat dry. To apply this medicine use a cotton-tipped applicator, or use gloves if applying with fingertips. If applied with unprotected fingertips, it is very important to wash your hands well after you apply this medicine. Avoid applying to the eyes, nose, or mouth. Apply enough medicine to cover the affected area. You can cover the area with a light gauze dressing, but do not use tight or air-tight dressings. Finish the full course prescribed by your doctor or health care professional, even if you think your condition is better. Do not stop taking except on the advice of your doctor or health care professional. Talk to your pediatrician regarding the use of this medicine in children. Special care may be  needed. Overdosage: If you think you have taken too much of this medicine contact a poison control center or emergency room at once. NOTE: This medicine is only for you. Do not share this medicine with others. What if I miss a dose? If you miss a dose, apply it as soon as you can. If it is almost time for your next dose, only use that dose. Do not apply extra doses. Contact your doctor or health care professional if you miss more than one dose. What may interact with this medicine? Interactions are not expected. Do not use any other skin products without telling your doctor or health care professional. This list may not describe all possible interactions. Give your health care provider a list of all the medicines, herbs, non-prescription drugs, or dietary supplements you use. Also tell them if you smoke, drink alcohol, or use illegal drugs. Some items may interact with your medicine. What should I watch for while using this medicine? Visit your doctor or health care professional for checks on your progress. You will need to use this medicine for 2 to 6 weeks. This may  be longer depending on the condition being treated. You may not see full healing for another 1 to 2 months after you stop using the medicine. Treated areas of skin can look unsightly during and for several weeks after treatment with this medicine. This medicine can make you more sensitive to the sun. Keep out of the sun. If you cannot avoid being in the sun, wear protective clothing and use sunscreen. Do not use sun lamps or tanning beds/booths. Where should I keep my What side effects may I notice from receiving this medicine? Side effects that you should report to your doctor or health care professional as soon as possible: -allergic reactions like skin rash, itching or hives, swelling of the face, lips, or tongue -black or bloody stools, blood in the urine or vomit -blurred vision -chest pain -difficulty breathing or  wheezing -redness, blistering, peeling or loosening of the skin, including inside the mouth -severe redness and swelling of normal skin -slurred speech or weakness on one side of the body -trouble passing urine or change in the amount of urine -unexplained weight gain or swelling -unusually weak or tired -yellowing of eyes or skin Side effects that usually do not require medical attention (Report these to your doctor or health care professional if they continue or are bothersome.): -increased sensitivity of the skin to sun and ultraviolet light -pain and burning of the affected area -scaling or swelling of the affected area -skin rash, itching of the affected area -tenderness This list may not describe all possible side effects. Call your doctor for medical advice about side effects. You may report side effects to FDA at 1-800-FDA-1088. Where should I keep my medicine? Keep out of the reach of children. Store at room temperature between 20 and 25 degrees C (68 and 77 degrees F). Throw away any unused medicine after the expiration date. NOTE: This sheet is a summary. It may not cover all possible information. If you have questions about this medicine, talk to your doctor, pharmacist, or health care provider.    2016, Elsevier/Gold Standard. (2013-12-08 11:09:58)

## 2016-01-04 NOTE — Patient Instructions (Signed)
[place] at [facility] Discharge Instructions  RECOMMENDATIONS MADE BY THE CONSULTANT AND ANY TEST RESULTS WILL BE SENT TO YOUR REFERRING PHYSICIAN.  Return as scheduled. Zofran, Compazine escribed to [facility].     Thank you for choosing [place] at [facility] to provide your oncology and hematology care.  To afford each patient quality time with our provider, please arrive at least 15 minutes before your scheduled appointment time.   Beginning January 23rd 2017 lab work for the [facility] will be done in the  Main lab at [facility] on 1st floor. If you have a lab appointment with the [facility] please come in thru the  Main Entrance and check in at the main information desk  You need to re-schedule your appointment should you arrive 10 or more minutes late.  We strive to give you quality time with our providers, and arriving late affects you and other patients whose appointments are after yours.  Also, if you no show three or more times for appointments you may be dismissed from the clinic at the providers discretion.     Again, thank you for choosing [facility].  Our hope is that these requests will decrease the amount of time that you wait before being seen by our physicians.       _____________________________________________________________  Should you have questions after your visit to [facility], please contact our office at [phone] between the hours of 8:30 a.m. and 4:30 p.m.  Voicemails left after 4:30 p.m. will not be returned until the following business day.  For prescription refill requests, have your pharmacy contact our office.         Resources For Cancer Patients and their Caregivers ? American Cancer Society: Can assist with transportation, wigs, general needs, runs Look Good Feel Better.        ([phone] ? Cancer Care: Provides financial assistance, online support  groups, medication/co-pay assistance.  1-800-813-HOPE 828-414-2729) ? [place] Assists [place] cancer patients and their families through emotional , educational and financial support.  ([phone] ? [place] DSS Where to apply for food stamps, Medicaid and utility assistance. 604-484-1559 ? [employer]: Transportation to medical appointments. 316-703-6224 ? Social Security Administration: May apply for disability if have a Stage IV cancer. 973-637-3372 [phone] ? [employer], Disability and Transit Services: Assists with nutrition, care and transit needs. [place] Support Programs: @10RELATIVEDAYS @ > Cancer Support Group  2nd Tuesday of the month 1pm-2pm, Journey Room  > Creative Journey  3rd Tuesday of the month 1130am-1pm, Journey Room  > Look Good Feel Better  1st Wednesday of the month 10am-12 noon, Journey Room (Call [place] to register [phone])   .

## 2016-01-05 ENCOUNTER — Encounter (HOSPITAL_COMMUNITY): Payer: BLUE CROSS/BLUE SHIELD

## 2016-01-06 ENCOUNTER — Encounter (HOSPITAL_COMMUNITY): Payer: Self-pay

## 2016-01-06 ENCOUNTER — Encounter (HOSPITAL_BASED_OUTPATIENT_CLINIC_OR_DEPARTMENT_OTHER): Payer: BLUE CROSS/BLUE SHIELD

## 2016-01-06 VITALS — BP 108/66 | HR 98 | Temp 98.4°F | Resp 18

## 2016-01-06 DIAGNOSIS — Z452 Encounter for adjustment and management of vascular access device: Secondary | ICD-10-CM

## 2016-01-06 DIAGNOSIS — C20 Malignant neoplasm of rectum: Secondary | ICD-10-CM | POA: Diagnosis not present

## 2016-01-06 MED ORDER — SODIUM CHLORIDE 0.9% FLUSH
10.0000 mL | INTRAVENOUS | Status: DC | PRN
Start: 2016-01-06 — End: 2016-01-06
  Administered 2016-01-06: 10 mL
  Filled 2016-01-06: qty 10

## 2016-01-06 MED ORDER — HEPARIN SOD (PORK) LOCK FLUSH 100 UNIT/ML IV SOLN
500.0000 [IU] | Freq: Once | INTRAVENOUS | Status: AC | PRN
  Administered 2016-01-06: 500 [IU]

## 2016-01-06 NOTE — Progress Notes (Signed)
1440:  Mannan Debaker presents to have home infusion pump d/c'd and PICC flush.  PICC located right arm flushed with NS and 250 units heparin. Procedure tolerated well and without incident.

## 2016-01-06 NOTE — Patient Instructions (Signed)
[place] at [facility] Discharge Instructions  RECOMMENDATIONS MADE BY THE CONSULTANT AND ANY TEST RESULTS WILL BE SENT TO YOUR REFERRING PHYSICIAN.  Pump removal and PICC flush today. Expect a call tomorrow for post chemotherapy 24 hour follow-up. Return for office visit as scheduled next week. Call the clinic should you have any questions or concerns.   Thank you for choosing [place] at [facility] to provide your oncology and hematology care.  To afford each patient quality time with our provider, please arrive at least 15 minutes before your scheduled appointment time.   Beginning January 23rd 2017 lab work for the [facility] will be done in the  Main lab at [facility] on 1st floor. If you have a lab appointment with the [facility] please come in thru the  Main Entrance and check in at the main information desk  You need to re-schedule your appointment should you arrive 10 or more minutes late.  We strive to give you quality time with our providers, and arriving late affects you and other patients whose appointments are after yours.  Also, if you no show three or more times for appointments you may be dismissed from the clinic at the providers discretion.     Again, thank you for choosing [facility].  Our hope is that these requests will decrease the amount of time that you wait before being seen by our physicians.       _____________________________________________________________  Should you have questions after your visit to [facility], please contact our office at [phone] between the hours of 8:30 a.m. and 4:30 p.m.  Voicemails left after 4:30 p.m. will not be returned until the following business day.  For prescription refill requests, have your pharmacy contact our office.         Resources For Cancer Patients and their Caregivers ? American Cancer Society: Can assist with  transportation, wigs, general needs, runs Look Good Feel Better.        438-231-0350 ? Cancer Care: Provides financial assistance, online support groups, medication/co-pay assistance.  1-800-813-HOPE 626-460-7164) ? [place] Assists [place] cancer patients and their families through emotional , educational and financial support.  714-362-2332 ? [place] DSS Where to apply for food stamps, Medicaid and utility assistance. 272-802-7380 ? RCATS: Transportation to medical appointments. 854 619 7091 ? Social Security Administration: May apply for disability if have a Stage IV cancer. 786-751-1130 [phone]) [phone] ? [employer], Disability and Transit Services: Assists with nutrition, care and transit needs. [place] Support Programs: @10RELATIVEDAYS @ > Cancer Support Group  2nd Tuesday of the month 1pm-2pm, Journey Room  > Creative Journey  3rd Tuesday of the month 1130am-1pm, Journey Room  > Look Good Feel Better  1st Wednesday of the month 10am-12 noon, Journey Room (Call [place] to register [phone])

## 2016-01-08 ENCOUNTER — Telehealth (HOSPITAL_COMMUNITY): Payer: Self-pay | Admitting: *Deleted

## 2016-01-08 NOTE — Telephone Encounter (Signed)
48h call back: No answer when I called pt. Message left that we were checking on patient after receiving first dose of chemo.

## 2016-01-10 ENCOUNTER — Inpatient Hospital Stay (HOSPITAL_COMMUNITY): Payer: BLUE CROSS/BLUE SHIELD

## 2016-01-12 ENCOUNTER — Encounter (HOSPITAL_COMMUNITY): Payer: BLUE CROSS/BLUE SHIELD

## 2016-01-13 ENCOUNTER — Encounter (HOSPITAL_BASED_OUTPATIENT_CLINIC_OR_DEPARTMENT_OTHER): Payer: BLUE CROSS/BLUE SHIELD | Admitting: Hematology & Oncology

## 2016-01-13 ENCOUNTER — Encounter (HOSPITAL_COMMUNITY): Payer: Self-pay | Admitting: Hematology & Oncology

## 2016-01-13 ENCOUNTER — Encounter (HOSPITAL_COMMUNITY): Payer: BLUE CROSS/BLUE SHIELD

## 2016-01-13 VITALS — BP 110/65 | HR 92 | Temp 98.0°F | Resp 18

## 2016-01-13 VITALS — BP 120/81 | HR 100 | Temp 98.0°F | Resp 18 | Wt 167.9 lb

## 2016-01-13 DIAGNOSIS — C20 Malignant neoplasm of rectum: Secondary | ICD-10-CM | POA: Diagnosis not present

## 2016-01-13 DIAGNOSIS — Z95828 Presence of other vascular implants and grafts: Secondary | ICD-10-CM

## 2016-01-13 DIAGNOSIS — G4700 Insomnia, unspecified: Secondary | ICD-10-CM | POA: Diagnosis not present

## 2016-01-13 HISTORY — DX: Presence of other vascular implants and grafts: Z95.828

## 2016-01-13 MED ORDER — NORMAL SALINE FLUSH 0.9 % IV SOLN
INTRAVENOUS | Status: DC
Start: 2016-01-13 — End: 2017-05-15

## 2016-01-13 MED ORDER — HEPARIN SOD (PORK) LOCK FLUSH 100 UNIT/ML IV SOLN
250.0000 [IU] | Freq: Once | INTRAVENOUS | Status: AC
  Administered 2016-01-13: 250 [IU] via INTRAVENOUS

## 2016-01-13 MED ORDER — HEPARIN LOCK FLUSH 100 UNIT/ML IV SOLN: INTRAVENOUS | Status: DC

## 2016-01-13 MED ORDER — HEPARIN SOD (PORK) LOCK FLUSH 100 UNIT/ML IV SOLN
INTRAVENOUS | Status: AC
  Filled 2016-01-13: qty 5

## 2016-01-13 MED ORDER — SODIUM CHLORIDE 0.9% FLUSH
10.0000 mL | INTRAVENOUS | Status: DC | PRN
  Administered 2016-01-13: 10 mL via INTRAVENOUS
  Filled 2016-01-13: qty 10

## 2016-01-13 MED ORDER — CLONAZEPAM 0.5 MG PO TABS: 0.5000 mg | ORAL_TABLET | Freq: Every day | ORAL | Status: DC

## 2016-01-13 NOTE — Patient Instructions (Signed)
[place] at [facility] Discharge Instructions  RECOMMENDATIONS MADE BY THE CONSULTANT AND ANY TEST RESULTS WILL BE SENT TO YOUR REFERRING PHYSICIAN.  PICC line flush and dressing change today. Return as scheduled for dressing changes. Return as scheduled for office visit and chemotherapy.  Call the clinic should you have any questions or concerns.   Thank you for choosing [place] at [facility] to provide your oncology and hematology care.  To afford each patient quality time with our provider, please arrive at least 15 minutes before your scheduled appointment time.   Beginning January 23rd 2017 lab work for the [facility] will be done in the  Main lab at [facility] on 1st floor. If you have a lab appointment with the [place] please come in thru the  Main Entrance and check in at the main information desk  You need to re-schedule your appointment should you arrive 10 or more minutes late.  We strive to give you quality time with our providers, and arriving late affects you and other patients whose appointments are after yours.  Also, if you no show three or more times for appointments you may be dismissed from the clinic at the providers discretion.     Again, thank you for choosing [facility].  Our hope is that these requests will decrease the amount of time that you wait before being seen by our physicians.       _____________________________________________________________  Should you have questions after your visit to [facility], please contact our office at [phone] between the hours of 8:30 a.m. and 4:30 p.m.  Voicemails left after 4:30 p.m. will not be returned until the following business day.  For prescription refill requests, have your pharmacy contact our office.         Resources For Cancer Patients and their Caregivers ? American Cancer Society: Can assist with transportation,  wigs, general needs, runs Look Good Feel Better.        747-535-1926 ? Cancer Care: Provides financial assistance, online support groups, medication/co-pay assistance.  1-800-813-HOPE (986) 725-2293) ? [facility] Assists [place] cancer patients and their families through emotional , educational and financial support.  217-042-8505 ? [place] DSS Where to apply for food stamps, Medicaid and utility assistance. 850-513-0116 ? RCATS: Transportation to medical appointments. 801-350-9176 ? Social Security Administration: May apply for disability if have a Stage IV cancer. 304-065-7116 [phone] [phone] ? [employer], Disability and Transit Services: Assists with nutrition, care and transit needs. [facility] Support Programs: @10RELATIVEDAYS @ > Cancer Support Group  2nd Tuesday of the month 1pm-2pm, Journey Room  > Creative Journey  3rd Tuesday of the month 1130am-1pm, Journey Room  > Look Good Feel Better  1st Wednesday of the month 10am-12 noon, Journey Room (Call [place] to register [phone])

## 2016-01-13 NOTE — Progress Notes (Signed)
[place]  Progress Note  Patient Care Team: Jonathan Prose, MD as PCP - General (Family Medicine)  CHIEF COMPLAINTS/PURPOSE OF CONSULTATION:   Oncology History   Stage IIIB rectal adenocarcinoma, mT2N2M0 Treated with 5Gy x 5 at [facility]     Rectal cancer [facility])   09/20/2015 Procedure colonoscopy with firm rectal mass and 4 cm polyp in proximal rectum, infiltrating non-obstructing large mass within the distal rectum extending towards anal verge   09/20/2015 Miscellaneous Microsatellite stable.    09/28/2015 Imaging CT C/A/P no evidence of distant metastatic disease. irregular thickening of the low rectum and anal canal, several enlarged perirectal LN, indeterminate 48m RUL nodule   09/28/2015 Imaging MRI pelvis assym diffuse circum lobular thickening of rectum, near full wall thickness extension along lower anterior rectum W/O extra serosal extension c/w known adeno, mult. perirectal LN   12/06/2015 - 12/10/2015 Radiation Therapy [facility] Dr. AAngelina Wright  2500.00 cGy, 500.00 cGy per day in 5 fractions delivered 1x per day to the 96.0% Isodose line Treatment Dates: 12/06/2015 through 12/10/2015.    01/03/2016 Procedure PICC placed   01/04/2016 -  Chemotherapy FOLFOX x 6 cycles    HISTORY OF PRESENTING ILLNESS:  Jonathan Crochet89y.o. male is here for further follow-up of adenocarcinoma of the rectum. Tumor extends from the recto sigmoid junction to the anal verge. [facility] He wishes to receive his chemotherapy locally.   Mr. PNairwas here with his roommate Jonathan Wright.  He had high blood pressure today, and says he might have been stressed that it was raining again at the moment. Jonathan Wright notes "he can get stressed about appointments, like 'oh Lord another appointment," but both of them comment that he's generally not having high blood pressure.  Jonathan Wright that the  first 5 days after his chemo experience, he was really really tired. The first day going home he was fine; then he woke up the next morning feeling really drained, "like you're getting ready to get sick or something like that." He notes staying this way until Sunday, but that then he "came out of it." On Saturday, he says he slept 16 hours, but from that point on Sunday, he's been great. He say the nausea part was constantly there  To treat this, he started taking 1 nausea pill, but then tried the other "darker" one which seemed to work better to take the nausea away. Mr. PWeisenselnotes that the nausea feeling never totally went away, but it never got any worse than that.  Both Mr. PDollarand Jonathan Fullercomment that he's still eating fine. Jonathan Wright he's sleeping okay. When asked if he's getting depressed, he says "I mean, you know; not really." But he does remark that everything is "deeper" now. He says he is overall good. Jonathan Wright adds that he "doesn't sleep great," noting that he's noticed he wakes up more throughout the night. [name] his trouble is with staying asleep, not falling asleep. He says, for example, if he goes to bed at midnight, he wakes up at 3.  He says that his "rear end" is fine. [name] that, when the side-effect symptoms came on, they came on; but other than this he feels Wright. He denies any skin changes, no diarrhea.   Jonathan Wright notes that "even though he's saying he's not doing much, we've still been going out and  doing things. He is still going to the greenhouse and getting out, doing things."  Jonathan Wright denies any mouth sores, diarrhea now, heartburn, or reflux symptoms. He notes his appetite is currently good. He denies any numbness or tingling in his fingers/toes.    MEDICAL HISTORY:  Past Medical History  Diagnosis Date  . Depression   . Anxiety     SURGICAL HISTORY: No past surgical history on file.  SOCIAL HISTORY: Social History    Social History  . Marital Status: Divorced    Spouse Name: [name]  . Number of Children: [name]  . Years of Education: [name]   Occupational History  . Not on file.   Social History Main Topics  . Smoking status: Never Smoker   . Smokeless tobacco: Not on file  . Alcohol Use: Yes     Comment: occ.   . Drug Use: No  . Sexual Activity: Not on file   Other Topics Concern  . 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 Mother-Healthy Father-deceased; late 40s early 79s. Doesn't know much about him 2 brothers-both healthy No other cancer in his family ALLERGIES:  is allergic to penicillins and penicillin g.  MEDICATIONS:  Current Outpatient Prescriptions  Medication Sig Dispense Refill  . dextrose 5 % SOLN 1,000 mL with fluorouracil 5 GM/100ML SOLN Inject into the vein. To infuse over 46 hours every 14 days. To start May 8    . leucovorin 50 MG injection Inject into the vein daily. Every 14 days x 6 cycles. Starting May 8.    . ondansetron (ZOFRAN) 8 MG tablet Take 1 tablet (8 mg total) by mouth every 8 (eight) hours as needed for nausea or vomiting. 30 tablet 2  . OXALIPLATIN IV Inject into the vein. Every 14 days x 6 cycles. Starting May 8.    . oxyCODONE (OXY IR/ROXICODONE) 5 MG immediate release tablet TAKE 1 TO 2 TABLETS PO Q 6 H PRN P  0  . prochlorperazine (COMPAZINE) 10 MG tablet Take 1 tablet (10 mg total) by mouth every 6 (six) hours as needed for nausea or vomiting. 30 tablet 2  . traMADol (ULTRAM) 50 MG tablet Take by mouth. Reported on 01/04/2016     No current facility-administered medications for this visit.    Review of Systems  Constitutional: Negative.  Negative for fever, chills, weight loss and malaise/fatigue.  HENT: Negative.  Negative for congestion,  hearing loss, nosebleeds, sore throat and tinnitus.   Eyes: Negative.  Negative for blurred vision, double vision, pain and discharge.  Respiratory: Negative.  Negative for cough, hemoptysis, sputum production, shortness of breath and wheezing.   Cardiovascular: Negative.  Negative for chest pain, palpitations, claudication, leg swelling and PND.  Gastrointestinal: Negative for heartburn, nausea, vomiting, abdominal pain, diarrhea, constipation, blood in stool and melena.  Genitourinary: Negative.  Negative for dysuria, urgency, frequency and hematuria.  Musculoskeletal: Negative.  Negative for myalgias, joint pain and falls.  Skin: Negative.  Negative for itching and rash.  Neurological: Negative.  Negative for dizziness, tingling, tremors, sensory change, speech change, focal weakness, seizures, loss of consciousness, weakness and headaches.  Endo/Heme/Allergies: Negative.  Does not bruise/bleed easily.  Psychiatric/Behavioral: Negative.  Negative for depression, suicidal ideas, memory loss and substance abuse. The patient is not nervous/anxious and  does not have insomnia.   All other systems reviewed and are negative.  14 point ROS was done and is otherwise as detailed above or in HPI    PHYSICAL EXAMINATION: ECOG PERFORMANCE STATUS: 1 - Symptomatic but completely ambulatory  Filed Vitals:   01/13/16 1021  BP: 130/110  Pulse: 100  Temp: 98 F (36.7 C)  Resp: 18   Filed Weights   01/13/16 1021  Weight: 167 lb 14.4 oz (76.159 kg)   Physical Exam  Constitutional: He is oriented to person, place, and time and well-developed, well-nourished, and in no distress.  HENT:  Head: Normocephalic and atraumatic.  Nose: Nose normal.  Mouth/Throat: Oropharynx is clear and moist. No oropharyngeal exudate.  Eyes: Conjunctivae and EOM are normal. Pupils are equal, round, and reactive to light. Right eye exhibits no discharge. Left eye exhibits no discharge. No scleral icterus.  Neck: Normal  range of motion. Neck supple. No tracheal deviation present. No thyromegaly present.  Cardiovascular: Normal rate, regular rhythm and normal heart sounds.  Exam reveals no gallop and no friction rub.   No murmur heard. Pulmonary/Chest: Effort normal and breath sounds normal. He has no wheezes. He has no rales.  Abdominal: Soft. Bowel sounds are normal. He exhibits no distension and no mass. There is no tenderness. There is no rebound and no guarding.  Musculoskeletal: Normal range of motion. He exhibits no edema.  Lymphadenopathy:    He has no cervical adenopathy.  Neurological: He is alert and oriented to person, place, and time. He has normal reflexes. No cranial nerve deficit. Gait normal. Coordination normal.  Skin: Skin is warm and dry. No rash noted.  Psychiatric: Mood, memory, affect and judgment normal.  Nursing note and vitals reviewed.  LABORATORY DATA:  I have reviewed the data as listed  Results for Jonathan, Wright (MRN [id number]) as of 01/13/2016 19:49  Ref. Range 01/04/2016 09:56  Sodium Latest Ref Range: 135-145 mmol/L 137  Potassium Latest Ref Range: 3.5-5.1 mmol/L 4.2  Chloride Latest Ref Range: 101-111 mmol/L 104  CO2 Latest Ref Range: 22-32 mmol/L 26  BUN Latest Ref Range: 6-20 mg/dL 12  Creatinine Latest Ref Range: 0.61-1.24 mg/dL 0.86  Calcium Latest Ref Range: 8.9-10.3 mg/dL 9.6  EGFR (Non-African Amer.) Latest Ref Range: >60 mL/min >60  EGFR (African American) Latest Ref Range: >60 mL/min >60  Glucose Latest Ref Range: 65-99 mg/dL 87  Anion gap Latest Ref Range: 5-15  7  Alkaline Phosphatase Latest Ref Range: 38-126 U/L 81  Albumin Latest Ref Range: 3.5-5.0 g/dL 4.1  AST Latest Ref Range: 15-41 U/L 24  ALT Latest Ref Range: 17-63 U/L 31  Total Protein Latest Ref Range: 6.5-8.1 g/dL 7.8  Total Bilirubin Latest Ref Range: 0.3-1.2 mg/dL 0.4  WBC Latest Ref Range: 4.0-10.5 K/uL 7.3  RBC Latest Ref Range: 4.22-5.81 MIL/uL 4.94  Hemoglobin Latest Ref Range:  13.0-17.0 g/dL 13.8  HCT Latest Ref Range: 39.0-52.0 % 43.4  MCV Latest Ref Range: 78.0-100.0 fL 87.9  MCH Latest Ref Range: 26.0-34.0 pg 27.9  MCHC Latest Ref Range: 30.0-36.0 g/dL 31.8  RDW Latest Ref Range: 11.5-15.5 % 15.5  Platelets Latest Ref Range: 150-400 K/uL 267  Neutrophils Latest Units: % 64  Lymphocytes Latest Units: % 16  Monocytes Relative Latest Units: % 16  Eosinophil Latest Units: % 3  Basophil Latest Units: % 1  NEUT# Latest Ref Range: 1.7-7.7 K/uL 4.7  Lymphocyte # Latest Ref Range: 0.7-4.0 K/uL 1.2  Monocyte # Latest Ref Range: 0.1-1.0 K/uL 1.1 (H)  Eosinophils Absolute Latest Ref Range: 0.0-0.7 K/uL 0.2  Basophils Absolute Latest Ref Range: 0.0-0.1 K/uL 0.1        ASSESSMENT & PLAN:  T2N2aM0 adenocarcinoma of the rectum Tumor extends from rectosigmoid junction to anal verge S/P short course XRT at [facility] FOLFOX Insomnia  He has done fairly well with cycle #1 of FOLFOX. I have made sure he is scheduled for PICC line maintenance. He can be taught to do flushes at home. He did not desire port placement.  We will plan on seeing him prior to each cycle. He will return next week for cycle #3, labs and physical exam.  I have written him a prescription for klonopin 0.5 mg to try to aid in his sleep. If this does not benefit we discussed other options. He has no difficulty falling asleep but maintaining sleep through the night.   All questions were answered. The patient knows to call the clinic with any problems, questions or concerns.  This document serves as a record of services personally performed by Ancil Linsey, MD. It was created on her behalf by Toni Amend, a trained medical scribe. The creation of this record is based on the scribe's personal observations and the provider's statements to them. This document has been checked and approved by the attending provider.  I have reviewed the above documentation for accuracy and completeness, and  I agree with the above.  This note was electronically signed.    Molli Hazard, MD  01/13/2016 11:13 AM

## 2016-01-13 NOTE — Progress Notes (Signed)
1515:  Jonathan Wright presented for PICC flush and dressing change.  See IV assessment in docflowsheets for PICC details.  PICC located right arm.  Good blood return present. PICC flushed with 41ml NS and 250U Heparin, see MAR for further details.  Masaru Thrun tolerated procedure well and without incident.

## 2016-01-13 NOTE — Patient Instructions (Addendum)
[facility] at [facility] Discharge Instructions  RECOMMENDATIONS MADE BY THE CONSULTANT AND ANY TEST RESULTS WILL BE SENT TO YOUR REFERRING PHYSICIAN.  Return as scheduled.  Thank you for choosing [facility] at [facility] to provide your oncology and hematology care.  To afford each patient quality time with our provider, please arrive at least 15 minutes before your scheduled appointment time.   Beginning January 23rd 2017 lab work for the Cancer Center will be done in the  Main lab at [place] [facility] on 1st floor. If you have a lab appointment with the Cancer Center please come in thru the  Main Entrance and check in at the main information desk  You need to re-schedule your appointment should you arrive 10 or more minutes late.  We strive to give you quality time with our providers, and arriving late affects you and other patients whose appointments are after yours.  Also, if you no show three or more times for appointments you may be dismissed from the clinic at the providers discretion.     Again, thank you for choosing [facility].  Our hope is that these requests will decrease the amount of time that you wait before being seen by our physicians.       _____________________________________________________________  Should you have questions after your visit to [facility], please contact our office at [phone] between the hours of 8:30 a.m. and 4:30 p.m.  Voicemails left after 4:30 p.m. will not be returned until the following business day.  For prescription refill requests, have your pharmacy contact our office.         Resources For Cancer Patients and their Caregivers ? American Cancer Society: Can assist with transportation, wigs, general needs, runs Look Good Feel Better.        1-888-227-6333 ? Cancer Care: Provides financial assistance, online support groups, medication/co-pay assistance.   1-800-813-HOPE (4673) ? [facility] Assists [place] cancer patients and their families through emotional , educational and financial support.  336-427-4357 ? [place] DSS Where to apply for food stamps, Medicaid and utility assistance. 336-342-1394 ? RCATS: Transportation to medical appointments. 336-347-2287 ? Social Security Administration: May apply for disability if have a Stage IV cancer. 336-342-7796 1-800-772-1213 ? [place] Aging, Disability and Transit Services: Assists with nutrition, care and transit needs. 336-349-2343  Cancer Center Support Programs: @10RELATIVEDAYS@ > Cancer Support Group  2nd Tuesday of the month 1pm-2pm, Journey Room  > Creative Journey  3rd Tuesday of the month 1130am-1pm, Journey Room  > Look Good Feel Better  1st Wednesday of the month 10am-12 noon, Journey Room (Call [employer] to register [phone])   

## 2016-01-18 ENCOUNTER — Encounter (HOSPITAL_COMMUNITY): Payer: Self-pay

## 2016-01-18 ENCOUNTER — Encounter (HOSPITAL_BASED_OUTPATIENT_CLINIC_OR_DEPARTMENT_OTHER): Payer: BLUE CROSS/BLUE SHIELD

## 2016-01-18 VITALS — BP 121/83 | HR 84 | Temp 99.3°F | Resp 18 | Wt 169.0 lb

## 2016-01-18 DIAGNOSIS — Z5111 Encounter for antineoplastic chemotherapy: Secondary | ICD-10-CM | POA: Diagnosis not present

## 2016-01-18 DIAGNOSIS — C20 Malignant neoplasm of rectum: Secondary | ICD-10-CM

## 2016-01-18 LAB — COMPREHENSIVE METABOLIC PANEL
ALBUMIN: 3.8 g/dL (ref 3.5–5.0)
ALK PHOS: 78 U/L (ref 38–126)
ALT: 18 U/L (ref 17–63)
AST: 19 U/L (ref 15–41)
Anion gap: 8 (ref 5–15)
BUN: 11 mg/dL (ref 6–20)
CALCIUM: 8.7 mg/dL — AB (ref 8.9–10.3)
CO2: 24 mmol/L (ref 22–32)
CREATININE: 0.95 mg/dL (ref 0.61–1.24)
Chloride: 105 mmol/L (ref 101–111)
GFR calc non Af Amer: >60 mL/min (ref >60)
GLUCOSE: 79 mg/dL (ref 65–99)
Potassium: 4.2 mmol/L (ref 3.5–5.1)
SODIUM: 137 mmol/L (ref 135–145)
Total Bilirubin: 0.5 mg/dL (ref 0.3–1.2)
Total Protein: 7 g/dL (ref 6.5–8.1)

## 2016-01-18 LAB — CBC WITH DIFFERENTIAL/PLATELET
Basophils Absolute: 0.1 10*3/uL (ref 0.0–0.1)
Basophils Relative: 1 %
EOS ABS: 0.4 10*3/uL (ref 0.0–0.7)
Eosinophils Relative: 8 %
HCT: 39.7 % (ref 39.0–52.0)
HEMOGLOBIN: 12.9 g/dL — AB (ref 13.0–17.0)
LYMPHS ABS: 1.1 10*3/uL (ref 0.7–4.0)
Lymphocytes Relative: 21 %
MCH: 28.6 pg (ref 26.0–34.0)
MCHC: 32.5 g/dL (ref 30.0–36.0)
MCV: 88 fL (ref 78.0–100.0)
Monocytes Absolute: 1 10*3/uL (ref 0.1–1.0)
Monocytes Relative: 20 %
NEUTROS PCT: 50 %
Neutro Abs: 2.5 10*3/uL (ref 1.7–7.7)
Platelets: 187 10*3/uL (ref 150–400)
RBC: 4.51 MIL/uL (ref 4.22–5.81)
RDW: 15.6 % — ABNORMAL HIGH (ref 11.5–15.5)
WBC: 5 10*3/uL (ref 4.0–10.5)

## 2016-01-18 MED ORDER — SODIUM CHLORIDE 0.9 % IV SOLN
2400.0000 mg/m2 | INTRAVENOUS | Status: DC
  Administered 2016-01-18: 4650 mg via INTRAVENOUS
  Filled 2016-01-18: qty 93

## 2016-01-18 MED ORDER — DEXTROSE 5 % IV SOLN
Freq: Once | INTRAVENOUS | Status: AC
  Administered 2016-01-18: 13:00:00 via INTRAVENOUS

## 2016-01-18 MED ORDER — FLUOROURACIL CHEMO INJECTION 2.5 GM/50ML
400.0000 mg/m2 | Freq: Once | INTRAVENOUS | Status: AC
  Administered 2016-01-18: 750 mg via INTRAVENOUS
  Filled 2016-01-18: qty 8

## 2016-01-18 MED ORDER — PALONOSETRON HCL INJECTION 0.25 MG/5ML
0.2500 mg | Freq: Once | INTRAVENOUS | Status: AC
  Administered 2016-01-18: 0.25 mg via INTRAVENOUS
  Filled 2016-01-18: qty 5

## 2016-01-18 MED ORDER — SODIUM CHLORIDE 0.9% FLUSH: 10.0000 mL | INTRAVENOUS | Status: DC | PRN

## 2016-01-18 MED ORDER — SODIUM CHLORIDE 0.9 % IV SOLN
10.0000 mg | Freq: Once | INTRAVENOUS | Status: AC
  Administered 2016-01-18: 10 mg via INTRAVENOUS
  Filled 2016-01-18: qty 1

## 2016-01-18 MED ORDER — OXALIPLATIN CHEMO INJECTION 100 MG/20ML
85.0000 mg/m2 | Freq: Once | INTRAVENOUS | Status: AC
  Administered 2016-01-18: 165 mg via INTRAVENOUS
  Filled 2016-01-18: qty 33

## 2016-01-18 MED ORDER — HEPARIN SOD (PORK) LOCK FLUSH 100 UNIT/ML IV SOLN
500.0000 [IU] | Freq: Once | INTRAVENOUS | Status: DC | PRN
  Filled 2016-01-18: qty 5

## 2016-01-18 MED ORDER — LEUCOVORIN CALCIUM INJECTION 350 MG
400.0000 mg/m2 | Freq: Once | INTRAMUSCULAR | Status: AC
  Administered 2016-01-18: 772 mg via INTRAVENOUS
  Filled 2016-01-18: qty 38.6

## 2016-01-18 NOTE — Patient Instructions (Signed)
[facility] Discharge Instructions for Patients Receiving Chemotherapy   Beginning January 23rd 2017 lab [facility] will be done in the  Main lab at [facility] on 1st floor. If you have a lab appointment with the [place] please come in thru the  Main Entrance and check in at the main information desk   Today you received the following chemotherapy agents:  Oxaliplatin, leucovorin, and 21fu  To help prevent nausea and vomiting, take your nausea medication as prescribed.  If you develop nausea and vomiting, or diarrhea that is not controlled by your medication, call the clinic.  The clinic phone number is [phone]. Office hours are Monday-Friday 8:30am-5:00pm.  BELOW ARE SYMPTOMS THAT SHOULD BE REPORTED IMMEDIATELY:  *FEVER GREATER THAN 101.0 F  *CHILLS WITH OR WITHOUT FEVER  NAUSEA AND VOMITING THAT IS NOT CONTROLLED WITH YOUR NAUSEA MEDICATION  *UNUSUAL SHORTNESS OF BREATH  *UNUSUAL BRUISING OR BLEEDING  TENDERNESS IN MOUTH AND THROAT WITH OR WITHOUT PRESENCE OF ULCERS  *URINARY PROBLEMS  *BOWEL PROBLEMS  UNUSUAL RASH Items with * indicate a potential emergency and should be followed up as soon as possible. If you have an emergency after office hours please contact your primary care physician or go to the nearest emergency department.  Please call the clinic during office hours if you have any questions or concerns.   You may also contact the Patient Navigator at [phone] should you have any questions or need assistance in obtaining follow up care.      Resources For Cancer Patients and their Caregivers ? American Cancer Society: Can assist with transportation, wigs, general needs, runs Look Good Feel Better.        319-740-5329 ? Cancer Care: Provides financial assistance, online support groups, medication/co-pay assistance.  1-800-813-HOPE 336 555 0873) ? [place] Assists [place] cancer  patients and their families through emotional , educational and financial support.  (973)697-9003 ? [place] DSS Where to apply for food stamps, Medicaid and utility assistance. 213-004-3277 ? RCATS: Transportation to medical appointments. (623)146-0178 ? Social Security Administration: May apply for disability if have a Stage IV cancer. 352-110-5864 [phone] ? [employer], Disability and Transit Services: Assists with nutrition, care and transit needs. [phone]

## 2016-01-18 NOTE — Progress Notes (Signed)
1545:  Tolerated tx w/o adverse reaction.  A&Ox4, in no distress. VSS.  Discharged ambulatory.

## 2016-01-20 ENCOUNTER — Encounter (HOSPITAL_COMMUNITY): Payer: BLUE CROSS/BLUE SHIELD | Attending: Hematology & Oncology

## 2016-01-20 VITALS — BP 113/81 | HR 87 | Temp 98.7°F | Resp 18

## 2016-01-20 DIAGNOSIS — Z452 Encounter for adjustment and management of vascular access device: Secondary | ICD-10-CM | POA: Diagnosis not present

## 2016-01-20 DIAGNOSIS — C20 Malignant neoplasm of rectum: Secondary | ICD-10-CM | POA: Diagnosis not present

## 2016-01-20 MED ORDER — HEPARIN SOD (PORK) LOCK FLUSH 100 UNIT/ML IV SOLN
250.0000 [IU] | Freq: Once | INTRAVENOUS | Status: AC | PRN
  Administered 2016-01-20: 250 [IU]

## 2016-01-20 MED ORDER — SODIUM CHLORIDE 0.9% FLUSH
10.0000 mL | INTRAVENOUS | Status: DC | PRN
  Administered 2016-01-20: 10 mL
  Filled 2016-01-20: qty 10

## 2016-01-20 NOTE — Progress Notes (Signed)
Jonathan Wright presented for pump d/c and PICC flush and dressing change.  See IV assessment in docflowsheets for PICC details.  PICC located right arm.  Good blood return present. PICC flushed with 43ml NS and 250U Heparin, see MAR for further details.  Jonathan Wright tolerated procedure well and without incident.

## 2016-01-20 NOTE — Patient Instructions (Signed)
[place] at [facility] Discharge Instructions  RECOMMENDATIONS MADE BY THE CONSULTANT AND ANY TEST RESULTS WILL BE SENT TO YOUR REFERRING PHYSICIAN.  Pump d/c and PICC flush/dressing change. Return as scheduled for PICC flush and dressing change. Return as scheduled for office visit and chemotherapy.   Thank you for choosing [place] at [facility] to provide your oncology and hematology care.  To afford each patient quality time with our provider, please arrive at least 15 minutes before your scheduled appointment time.   Beginning January 23rd 2017 lab work for the [facility] will be done in the  Main lab at [facility] on 1st floor. If you have a lab appointment with the [place] please come in thru the  Main Entrance and check in at the main information desk  You need to re-schedule your appointment should you arrive 10 or more minutes late.  We strive to give you quality time with our providers, and arriving late affects you and other patients whose appointments are after yours.  Also, if you no show three or more times for appointments you may be dismissed from the clinic at the providers discretion.     Again, thank you for choosing [facility].  Our hope is that these requests will decrease the amount of time that you wait before being seen by our physicians.       _____________________________________________________________  Should you have questions after your visit to [facility], please contact our office at [phone] between the hours of 8:30 a.m. and 4:30 p.m.  Voicemails left after 4:30 p.m. will not be returned until the following business day.  For prescription refill requests, have your pharmacy contact our office.         Resources For Cancer Patients and their Caregivers ? American Cancer Society: Can assist with transportation, wigs, general needs, runs Look Good Feel Better.         (905)696-4321 ? Cancer Care: Provides financial assistance, online support groups, medication/co-pay assistance.  1-800-813-HOPE 306-589-1175) ? [place] Assists [place] cancer patients and their families through emotional , educational and financial support.  367-522-0123 ? [place] DSS Where to apply for food stamps, Medicaid and utility assistance. 774-377-9993 ? RCATS: Transportation to medical appointments. 830-611-7910 ? Social Security Administration: May apply for disability if have a Stage IV cancer. (628)780-8521 408-239-8044 ? [employer], Disability and Transit Services: Assists with nutrition, care and transit needs. [place] Support Programs: @10RELATIVEDAYS @ > Cancer Support Group  2nd Tuesday of the month 1pm-2pm, Journey Room  > Creative Journey  3rd Tuesday of the month 1130am-1pm, Journey Room  > Look Good Feel Better  1st Wednesday of the month 10am-12 noon, Journey Room (Call [place] to register [phone])

## 2016-01-24 ENCOUNTER — Ambulatory Visit (HOSPITAL_COMMUNITY): Payer: BLUE CROSS/BLUE SHIELD | Admitting: Hematology & Oncology

## 2016-01-24 ENCOUNTER — Inpatient Hospital Stay (HOSPITAL_COMMUNITY): Payer: BLUE CROSS/BLUE SHIELD

## 2016-01-26 ENCOUNTER — Encounter (HOSPITAL_COMMUNITY): Payer: BLUE CROSS/BLUE SHIELD

## 2016-02-01 ENCOUNTER — Encounter (HOSPITAL_COMMUNITY): Payer: Self-pay

## 2016-02-01 ENCOUNTER — Encounter: Payer: Self-pay | Admitting: *Deleted

## 2016-02-01 ENCOUNTER — Encounter (HOSPITAL_COMMUNITY): Payer: BLUE CROSS/BLUE SHIELD | Attending: Hematology & Oncology

## 2016-02-01 ENCOUNTER — Encounter (HOSPITAL_COMMUNITY): Payer: Self-pay | Admitting: Hematology & Oncology

## 2016-02-01 ENCOUNTER — Encounter (HOSPITAL_COMMUNITY): Payer: BLUE CROSS/BLUE SHIELD | Attending: Hematology & Oncology | Admitting: Hematology & Oncology

## 2016-02-01 VITALS — BP 117/86 | HR 78 | Temp 98.0°F | Resp 18 | Wt 169.0 lb

## 2016-02-01 DIAGNOSIS — C20 Malignant neoplasm of rectum: Secondary | ICD-10-CM

## 2016-02-01 DIAGNOSIS — G4700 Insomnia, unspecified: Secondary | ICD-10-CM | POA: Diagnosis not present

## 2016-02-01 DIAGNOSIS — Z5111 Encounter for antineoplastic chemotherapy: Secondary | ICD-10-CM

## 2016-02-01 LAB — CBC WITH DIFFERENTIAL/PLATELET
Basophils Absolute: 0 10*3/uL (ref 0.0–0.1)
Basophils Relative: 1 %
EOS PCT: 3 %
Eosinophils Absolute: 0.2 10*3/uL (ref 0.0–0.7)
HEMATOCRIT: 40.3 % (ref 39.0–52.0)
Hemoglobin: 13.5 g/dL (ref 13.0–17.0)
LYMPHS PCT: 28 %
Lymphs Abs: 1.2 10*3/uL (ref 0.7–4.0)
MCH: 29.6 pg (ref 26.0–34.0)
MCHC: 33.5 g/dL (ref 30.0–36.0)
MCV: 88.4 fL (ref 78.0–100.0)
MONO ABS: 0.9 10*3/uL (ref 0.1–1.0)
MONOS PCT: 20 %
NEUTROS ABS: 2.1 10*3/uL (ref 1.7–7.7)
Neutrophils Relative %: 47 %
PLATELETS: 145 10*3/uL — AB (ref 150–400)
RBC: 4.56 MIL/uL (ref 4.22–5.81)
RDW: 16.3 % — AB (ref 11.5–15.5)
WBC: 4.4 10*3/uL (ref 4.0–10.5)

## 2016-02-01 LAB — COMPREHENSIVE METABOLIC PANEL
ALT: 23 U/L (ref 17–63)
ANION GAP: 5 (ref 5–15)
AST: 20 U/L (ref 15–41)
Albumin: 3.9 g/dL (ref 3.5–5.0)
Alkaline Phosphatase: 82 U/L (ref 38–126)
BILIRUBIN TOTAL: 0.5 mg/dL (ref 0.3–1.2)
BUN: 12 mg/dL (ref 6–20)
CHLORIDE: 104 mmol/L (ref 101–111)
CO2: 26 mmol/L (ref 22–32)
Calcium: 9 mg/dL (ref 8.9–10.3)
Creatinine, Ser: 0.97 mg/dL (ref 0.61–1.24)
Glucose, Bld: 101 mg/dL — ABNORMAL HIGH (ref 65–99)
POTASSIUM: 4 mmol/L (ref 3.5–5.1)
Sodium: 135 mmol/L (ref 135–145)
TOTAL PROTEIN: 6.9 g/dL (ref 6.5–8.1)

## 2016-02-01 MED ORDER — OXYCODONE HCL 5 MG PO TABS: 5.0000 mg | ORAL_TABLET | Freq: Four times a day (QID) | ORAL | Status: DC | PRN

## 2016-02-01 MED ORDER — SODIUM CHLORIDE 0.9 % IV SOLN
2400.0000 mg/m2 | INTRAVENOUS | Status: DC
  Administered 2016-02-01: 4650 mg via INTRAVENOUS
  Filled 2016-02-01: qty 93

## 2016-02-01 MED ORDER — SODIUM CHLORIDE 0.9% FLUSH
10.0000 mL | INTRAVENOUS | Status: DC | PRN
  Administered 2016-02-01: 10 mL
  Filled 2016-02-01: qty 10

## 2016-02-01 MED ORDER — HEPARIN SOD (PORK) LOCK FLUSH 100 UNIT/ML IV SOLN: 250.0000 [IU] | Freq: Once | INTRAVENOUS | Status: DC | PRN

## 2016-02-01 MED ORDER — DEXTROSE 5 % IV SOLN
Freq: Once | INTRAVENOUS | Status: AC
  Administered 2016-02-01: 12:00:00 via INTRAVENOUS

## 2016-02-01 MED ORDER — FLUOROURACIL CHEMO INJECTION 2.5 GM/50ML
400.0000 mg/m2 | Freq: Once | INTRAVENOUS | Status: AC
  Administered 2016-02-01: 750 mg via INTRAVENOUS
  Filled 2016-02-01: qty 15

## 2016-02-01 MED ORDER — DEXTROSE 5 % IV SOLN
400.0000 mg/m2 | Freq: Once | INTRAVENOUS | Status: AC
  Administered 2016-02-01: 772 mg via INTRAVENOUS
  Filled 2016-02-01: qty 38.6

## 2016-02-01 MED ORDER — DEXAMETHASONE SODIUM PHOSPHATE 100 MG/10ML IJ SOLN
10.0000 mg | Freq: Once | INTRAMUSCULAR | Status: AC
  Administered 2016-02-01: 10 mg via INTRAVENOUS
  Filled 2016-02-01: qty 1

## 2016-02-01 MED ORDER — PALONOSETRON HCL INJECTION 0.25 MG/5ML
0.2500 mg | Freq: Once | INTRAVENOUS | Status: AC
  Administered 2016-02-01: 0.25 mg via INTRAVENOUS

## 2016-02-01 MED ORDER — OXALIPLATIN CHEMO INJECTION 100 MG/20ML
85.0000 mg/m2 | Freq: Once | INTRAVENOUS | Status: AC
  Administered 2016-02-01: 165 mg via INTRAVENOUS
  Filled 2016-02-01: qty 33

## 2016-02-01 NOTE — Patient Instructions (Addendum)
[place] at [facility] Discharge Instructions  RECOMMENDATIONS MADE BY THE CONSULTANT AND ANY TEST RESULTS WILL BE SENT TO YOUR REFERRING PHYSICIAN.  Exam done and seen today by Dr. Whitney Muse Follow up as scheduled We refilled your Oxy 5mg  tabs today Return to see the doctor in 2 weeks Please call the clinic if you have any questions or concerns  Thank you for choosing [place] at [facility] to provide your oncology and hematology care.  To afford each patient quality time with our provider, please arrive at least 15 minutes before your scheduled appointment time.   Beginning January 23rd 2017 lab work for the [facility] will be done in the  Main lab at [facility] on 1st floor. If you have a lab appointment with the [place] please come in thru the  Main Entrance and check in at the main information desk  You need to re-schedule your appointment should you arrive 10 or more minutes late.  We strive to give you quality time with our providers, and arriving late affects you and other patients whose appointments are after yours.  Also, if you no show three or more times for appointments you may be dismissed from the clinic at the providers discretion.     Again, thank you for choosing [facility].  Our hope is that these requests will decrease the amount of time that you wait before being seen by our physicians.       _____________________________________________________________  Should you have questions after your visit to [facility], please contact our office at [phone] between the hours of 8:30 a.m. and 4:30 p.m.  Voicemails left after 4:30 p.m. will not be returned until the following business day.  For prescription refill requests, have your pharmacy contact our office.         Resources For Cancer Patients and their Caregivers ? American Cancer Society: Can assist with transportation, wigs,  general needs, runs Look Good Feel Better.        7095425755 ? Cancer Care: Provides financial assistance, online support groups, medication/co-pay assistance.  1-800-813-HOPE (870)102-5708) ? [place] Assists [place] cancer patients and their families through emotional , educational and financial support.  303-172-3899 ? [place] DSS Where to apply for food stamps, Medicaid and utility assistance. 2341320615 ? RCATS: Transportation to medical appointments. [phone] [phone] ? Social Security Administration: May apply for disability if have a Stage IV cancer. 619-375-7879 419-206-8962 ? [employer], Disability and Transit Services: Assists with nutrition, care and transit needs. [place] Support Programs: @10RELATIVEDAYS @ > Cancer Support Group  2nd Tuesday of the month 1pm-2pm, Journey Room  > Creative Journey  3rd Tuesday of the month 1130am-1pm, Journey Room  > Look Good Feel Better  1st Wednesday of the month 10am-12 noon, Journey Room (Call [place] to register [phone])

## 2016-02-01 NOTE — Progress Notes (Signed)
[place] Clinical Social Work  Clinical Social Work was referred by [place] rounding. CSW checked in with patient for assessment of psychosocial needs. Clinical Social Worker met with patient at [facility] during treatment to offer support and assess for needs.  Pt reports to be coping pretty well and adjusting to his treatments. Pt reports to have good support and is coping well. CSW reminded pt of upcoming painting class and to reach out as needs arise.     Clinical Social Work interventions: Check in  [place], [place] Tuesdays   [phone]

## 2016-02-01 NOTE — Progress Notes (Signed)
1435:  Tolerated tx w/o adverse reaction.  A&Ox4, in no distress.  VSS.  Discharged ambulatory.

## 2016-02-01 NOTE — Progress Notes (Signed)
[place]  Progress Note  Patient Care Team: Jonathan Prose, MD as PCP - General (Family Medicine)  CHIEF COMPLAINTS/PURPOSE OF CONSULTATION:   Oncology History   Stage IIIB rectal adenocarcinoma, mT2N2M0 Treated with 5Gy x 5 at [facility]     Rectal cancer [facility])   09/20/2015 Procedure colonoscopy with firm rectal mass and 4 cm polyp in proximal rectum, infiltrating non-obstructing large mass within the distal rectum extending towards anal verge   09/20/2015 Miscellaneous Microsatellite stable.    09/28/2015 Imaging CT C/A/P no evidence of distant metastatic disease. irregular thickening of the low rectum and anal canal, several enlarged perirectal LN, indeterminate 4m RUL nodule   09/28/2015 Imaging MRI pelvis assym diffuse circum lobular thickening of rectum, near full wall thickness extension along lower anterior rectum W/O extra serosal extension c/w known adeno, mult. perirectal LN   12/06/2015 - 12/10/2015 Radiation Therapy [facility] Dr. AAngelina Ok  2500.00 cGy, 500.00 cGy per day in 5 fractions delivered 1x per day to the 96.0% Isodose line Treatment Dates: 12/06/2015 through 12/10/2015.    01/03/2016 Procedure PICC placed   01/04/2016 -  Chemotherapy FOLFOX x 6 cycles    HISTORY OF PRESENTING ILLNESS:  Jonathan Crochet89y.o. male is here for further follow-up of adenocarcinoma of the rectum. Tumor extends from the recto sigmoid junction to the anal verge. [facility]   Mr. [place] [facility] [place] by his roommate Jonathan Wright.  He wonders if he can work out with a PICC line.  He notes that his fingers and toes are fine; his cold sensitivity is fine. He says that if he runs them under water, he feels a little "shock" or something; and if he gets something cold out of the fridge, he gets that  "shock" or "tingling." He denies any neuropathy in his fingers or toes. He denies any diarrhea, heartburn, mouth sores, and largely denies complaints. He acknowledges that it's a "weird feeling" with the cold sensitivity and even with lukewarm things. In general, though, he says "I can deal with it."  He feels that his second treatment went better, because he didn't push himself too much afterward. He feels he bounced back out of it on day 4 and was feeling pretty good.  Jonathan Wright notes that Mr. PMestashas only been taking 5 mg oxycodone once or twice a day. Mr. PChernethings that the little brown pill (compazine?) is working best. He's been keeping up with his nausea pills when he needs them.  When asked how he's sleeping, he says "I just wake up." He thinks it's stress, anxiety. Jonathan Wright comments on this in agreement, saying he thinks it's nerves. Jonathan Wright he usually doesn't sleep solid, but after a few nights of that restless sleep he will sleep through a night every now and then.  He's still working. Jonathan Wright comments that he's "doing a lot outdoors and keeping busy." He's stayed very active.  He notes that, after his combined radiation and chemotherapy so far, he just doesn't feel his tumor anymore and says "you almost want to say it's gone."  He is here today for cycle #3 of FOLFOX.  MEDICAL HISTORY:  Past Medical History  Diagnosis Date  . Depression   . Anxiety     SURGICAL HISTORY: No past surgical history on file.  SOCIAL  HISTORY: Social History   Social History  . Marital Status: Divorced    Spouse Name: [name]  . Number of Children: [name]  . Years of Education: [name]   Occupational History  . Not on file.   Social History Main Topics  . Smoking status: Never Smoker   . Smokeless tobacco: Not on file  . Alcohol Use: Yes     Comment: occ.   . Drug Use: No  . Sexual Activity: Not on file   Other Topics Concern  . Not on file   Social History Narrative  3  kids-2 boys 1 girl 1 grandchild Born in [place] Doesn't smoke Drinks very rarely (wine couple times a week normally) Education officer, community at [employer]   FAMILY HISTORY: No family history on file. Mother-Healthy Father-deceased; late 43s early 90s. Doesn't know much about him 2 brothers-both healthy No other cancer in his family ALLERGIES:  is allergic to penicillins and penicillin g.  MEDICATIONS:  Current Outpatient Prescriptions  Medication Sig Dispense Refill  . clonazePAM (KLONOPIN) 0.5 MG tablet Take 1 tablet (0.5 mg total) by mouth at bedtime. May take 2 tablet if needed 60 tablet 0  . dextrose 5 % SOLN 1,000 mL with fluorouracil 5 GM/100ML SOLN Inject into the vein. To infuse over 46 hours every 14 days. To start May 8    . Heparin Lock Flush (HEPARIN FLUSH, PORCINE,) 100 UNIT/ML injection Flush picc line with 2.52ml (250 units) of heparin lock flush on MWF weekly 15 Syringe 0  . leucovorin 50 MG injection Inject into the vein daily. Every 14 days x 6 cycles. Starting May 8.    . ondansetron (ZOFRAN) 8 MG tablet Take 1 tablet (8 mg total) by mouth every 8 (eight) hours as needed for nausea or vomiting. 30 tablet 2  . OXALIPLATIN IV Inject into the vein. Every 14 days x 6 cycles. Starting May 8.    . oxyCODONE (OXY IR/ROXICODONE) 5 MG immediate release tablet Take 1 tablet (5 mg total) by mouth every 6 (six) hours as needed for severe pain (take 1-2 tabs po every 6 hrs as needed). 30 tablet 0  . prochlorperazine (COMPAZINE) 10 MG tablet Take 1 tablet (10 mg total) by mouth every 6 (six) hours as needed for nausea or vomiting. 30 tablet 2  . Sodium Chloride Flush (NORMAL SALINE FLUSH) 0.9 % SOLN Flush picc line with 10 ml of normal saline on MWF weekly. 20 Syringe 0  . traMADol (ULTRAM) 50 MG tablet Take by mouth. Reported on 01/04/2016     No current facility-administered medications for this visit.   Facility-Administered Medications Ordered in Other Visits  Medication  Dose Route Frequency Provider Last Rate Last Dose  . fluorouracil (ADRUCIL) 4,650 mg in sodium chloride 0.9 % 57 mL chemo infusion  2,400 mg/m2 (Treatment Plan Actual) Intravenous 1 day or 1 dose Allene Pyo, MD      . fluorouracil (ADRUCIL) chemo injection 750 mg  400 mg/m2 (Treatment Plan Actual) Intravenous Once Allene Pyo, MD      . heparin lock flush 100 unit/mL  250 Units Intracatheter Once PRN Allene Pyo, MD      . leucovorin 772 mg in dextrose 5 % 250 mL infusion  400 mg/m2 (Treatment Plan Actual) Intravenous Once Allene Pyo, MD 144 mL/hr at 02/01/16 1224 772 mg at 02/01/16 1224  . oxaliplatin (ELOXATIN) 165 mg in dextrose 5 % 500 mL chemo infusion  85 mg/m2 (Treatment Plan Actual) Intravenous  Once Patrici Ranks, MD 267 mL/hr at 02/01/16 1223 165 mg at 02/01/16 1223  . sodium chloride flush (NS) 0.9 % injection 10 mL  10 mL Intracatheter PRN Patrici Ranks, MD        Review of Systems  Constitutional: Negative.  Negative for fever, chills, weight loss and malaise/fatigue.  HENT: Negative.  Negative for congestion, hearing loss, nosebleeds, sore throat and tinnitus.   Eyes: Negative.  Negative for blurred vision, double vision, pain and discharge.  Respiratory: Negative.  Negative for cough, hemoptysis, sputum production, shortness of breath and wheezing.   Cardiovascular: Negative.  Negative for chest pain, palpitations, claudication, leg swelling and PND.  Gastrointestinal: Negative for heartburn, nausea, vomiting, abdominal pain, diarrhea, constipation, blood in stool and melena.  Genitourinary: Negative.  Negative for dysuria, urgency, frequency and hematuria.  Musculoskeletal: Negative.  Negative for myalgias, joint pain and falls.  Skin: Negative.  Negative for itching and rash.  Neurological: Negative.  Negative for dizziness, tingling, tremors, sensory change, speech change, focal weakness, seizures, loss of consciousness, weakness and headaches.    Endo/Heme/Allergies: Negative.  Does not bruise/bleed easily.  Psychiatric/Behavioral: Negative.  Negative for depression, suicidal ideas, memory loss and substance abuse. The patient is not nervous/anxious and does not have insomnia.   All other systems reviewed and are negative. 14 point ROS was done and is otherwise as detailed above or in HPI    PHYSICAL EXAMINATION: ECOG PERFORMANCE STATUS: 1 - Symptomatic but completely ambulatory  Vitals - 1 value per visit 6/38/7564  SYSTOLIC 332  DIASTOLIC 86  Pulse 78  Temperature 98  Respirations 18  Weight (lb) 169  Height   BMI 24.95  VISIT REPORT     Physical Exam  Constitutional: He is oriented to person, place, and time and well-developed, well-nourished, and in no distress.  HENT:  Head: Normocephalic and atraumatic.  Nose: Nose normal.  Mouth/Throat: Oropharynx is clear and moist. No oropharyngeal exudate.  Eyes: Conjunctivae and EOM are normal. Pupils are equal, round, and reactive to light. Right eye exhibits no discharge. Left eye exhibits no discharge. No scleral icterus.  Neck: Normal range of motion. Neck supple. No tracheal deviation present. No thyromegaly present.  Cardiovascular: Normal rate, regular rhythm and normal heart sounds.  Exam reveals no gallop and no friction rub.   No murmur heard. Pulmonary/Chest: Effort normal and breath sounds normal. He has no wheezes. He has no rales.  Abdominal: Soft. Bowel sounds are normal. He exhibits no distension and no mass. There is no tenderness. There is no rebound and no guarding.  Musculoskeletal: Normal range of motion. He exhibits no edema.  Lymphadenopathy:    He has no cervical adenopathy.  Neurological: He is alert and oriented to person, place, and time. He has normal reflexes. No cranial nerve deficit. Gait normal. Coordination normal.  Skin: Skin is warm and dry. No rash noted.  Psychiatric: Mood, memory, affect and judgment normal.  Nursing note and vitals  reviewed.  LABORATORY DATA:  I have reviewed the data as listed  Results for KIMMIE, DOREN (MRN [id number]) as of 02/01/2016 13:22  Ref. Range 02/01/2016 10:25  Sodium Latest Ref Range: 135-145 mmol/L 135  Potassium Latest Ref Range: 3.5-5.1 mmol/L 4.0  Chloride Latest Ref Range: 101-111 mmol/L 104  CO2 Latest Ref Range: 22-32 mmol/L 26  BUN Latest Ref Range: 6-20 mg/dL 12  Creatinine Latest Ref Range: 0.61-1.24 mg/dL 0.97  Calcium Latest Ref Range: 8.9-10.3 mg/dL 9.0  EGFR (Non-African Amer.) Latest Ref  Range: >60 mL/min >60  EGFR (African American) Latest Ref Range: >60 mL/min >60  Glucose Latest Ref Range: 65-99 mg/dL 101 (H)  Anion gap Latest Ref Range: 5-15  5  Alkaline Phosphatase Latest Ref Range: 38-126 U/L 82  Albumin Latest Ref Range: 3.5-5.0 g/dL 3.9  AST Latest Ref Range: 15-41 U/L 20  ALT Latest Ref Range: 17-63 U/L 23  Total Protein Latest Ref Range: 6.5-8.1 g/dL 6.9  Total Bilirubin Latest Ref Range: 0.3-1.2 mg/dL 0.5  WBC Latest Ref Range: 4.0-10.5 K/uL 4.4  RBC Latest Ref Range: 4.22-5.81 MIL/uL 4.56  Hemoglobin Latest Ref Range: 13.0-17.0 g/dL 13.5  HCT Latest Ref Range: 39.0-52.0 % 40.3  MCV Latest Ref Range: 78.0-100.0 fL 88.4  MCH Latest Ref Range: 26.0-34.0 pg 29.6  MCHC Latest Ref Range: 30.0-36.0 g/dL 33.5  RDW Latest Ref Range: 11.5-15.5 % 16.3 (H)  Platelets Latest Ref Range: 150-400 K/uL 145 (L)  Neutrophils Latest Units: % 47  Lymphocytes Latest Units: % 28  Monocytes Relative Latest Units: % 20  Eosinophil Latest Units: % 3  Basophil Latest Units: % 1  NEUT# Latest Ref Range: 1.7-7.7 K/uL 2.1  Lymphocyte # Latest Ref Range: 0.7-4.0 K/uL 1.2  Monocyte # Latest Ref Range: 0.1-1.0 K/uL 0.9  Eosinophils Absolute Latest Ref Range: 0.0-0.7 K/uL 0.2  Basophils Absolute Latest Ref Range: 0.0-0.1 K/uL 0.0         ASSESSMENT & PLAN:  T2N2aM0 adenocarcinoma of the rectum Tumor extends from rectosigmoid junction to anal verge S/P short course XRT  at [place] is doing quite well with therapy. We will proceed with cycle #3 today. Plan is for 6 cycles and then he will return to [facility] for additional follow-up. I refilled his pain medication today.  We will keep him on his regular schedule, seeing him back in 2 weeks. CEA will be checked again at follow-up.  He knows to call in the interim with any problems or concerns.   All questions were answered. The patient knows to call the clinic with any problems, questions or concerns.  This document serves as a record of services personally performed by Ancil Linsey, MD. It was created on her behalf by Toni Amend, a trained medical scribe. The creation of this record is based on the scribe's personal observations and the provider's statements to them. This document has been checked and approved by the attending provider.  I have reviewed the above documentation for accuracy and completeness, and I agree with the above.  This note was electronically signed.  Molli Hazard, MD  02/01/2016 1:33 PM

## 2016-02-01 NOTE — Patient Instructions (Signed)
[facility] Discharge Instructions for Patients Receiving Chemotherapy   Beginning January 23rd 2017 lab work for the [facility] will be done in the  Main lab at [facility] on 1st floor. If you have a lab appointment with the [place] please come in thru the  Main Entrance and check in at the main information desk   Today you received the following chemotherapy agents:  Oxaliplatin, leucovorin, and 5FU.  If you develop nausea and vomiting, or diarrhea that is not controlled by your medication, call the clinic.  The clinic phone number is [phone]. Office hours are Monday-Friday 8:30am-5:00pm.  BELOW ARE SYMPTOMS THAT SHOULD BE REPORTED IMMEDIATELY:  *FEVER GREATER THAN 101.0 F  *CHILLS WITH OR WITHOUT FEVER  NAUSEA AND VOMITING THAT IS NOT CONTROLLED WITH YOUR NAUSEA MEDICATION  *UNUSUAL SHORTNESS OF BREATH  *UNUSUAL BRUISING OR BLEEDING  TENDERNESS IN MOUTH AND THROAT WITH OR WITHOUT PRESENCE OF ULCERS  *URINARY PROBLEMS  *BOWEL PROBLEMS  UNUSUAL RASH Items with * indicate a potential emergency and should be followed up as soon as possible. If you have an emergency after office hours please contact your primary care physician or go to the nearest emergency department.  Please call the clinic during office hours if you have any questions or concerns.   You may also contact the Patient Navigator at [phone] should you have any questions or need assistance in obtaining follow up care.      Resources For Cancer Patients and their Caregivers ? American Cancer Society: Can assist with transportation, wigs, general needs, runs Look Good Feel Better.        202-676-1424 ? Cancer Care: Provides financial assistance, online support groups, medication/co-pay assistance.  1-800-813-HOPE (701) 003-8535) ? [place] Assists [place] cancer patients and their families through emotional , educational and financial support.   684-404-4592 ? [place] DSS Where to apply for food stamps, Medicaid and utility assistance. 670-341-7615 ? RCATS: Transportation to medical appointments. [phone] ? Social Security Administration: May apply for disability if have a Stage IV cancer. [phone] ? [employer], Disability and Transit Services: Assists with nutrition, care and transit needs. [phone]

## 2016-02-03 ENCOUNTER — Encounter (HOSPITAL_COMMUNITY): Payer: Self-pay

## 2016-02-03 ENCOUNTER — Encounter (HOSPITAL_COMMUNITY): Payer: BLUE CROSS/BLUE SHIELD | Attending: Hematology & Oncology

## 2016-02-03 VITALS — BP 119/95 | HR 80 | Temp 98.7°F | Resp 18

## 2016-02-03 DIAGNOSIS — Z452 Encounter for adjustment and management of vascular access device: Secondary | ICD-10-CM | POA: Diagnosis not present

## 2016-02-03 DIAGNOSIS — C20 Malignant neoplasm of rectum: Secondary | ICD-10-CM

## 2016-02-03 MED ORDER — HEPARIN SOD (PORK) LOCK FLUSH 100 UNIT/ML IV SOLN
INTRAVENOUS | Status: AC
  Filled 2016-02-03: qty 5

## 2016-02-03 MED ORDER — SODIUM CHLORIDE 0.9% FLUSH
10.0000 mL | INTRAVENOUS | Status: DC | PRN
  Administered 2016-02-03: 10 mL
  Filled 2016-02-03: qty 10

## 2016-02-03 MED ORDER — HEPARIN SOD (PORK) LOCK FLUSH 100 UNIT/ML IV SOLN
500.0000 [IU] | Freq: Once | INTRAVENOUS | Status: AC | PRN
  Administered 2016-02-03: 500 [IU]

## 2016-02-03 NOTE — Progress Notes (Signed)
Jonathan Wright presented for PICC line flush and pump removal.  See IV assessment in docflowsheets for PICC details.  Proper placement of PICC confirmed by CXR.  PICC located right arm.  Good blood return present. PICC flushed with 60ml NS and 250U Heparin, see MAR for further details.  Jonathan Wright tolerated procedure well and without incident.

## 2016-02-03 NOTE — Patient Instructions (Signed)
[place] at [facility] Discharge Instructions  RECOMMENDATIONS MADE BY THE CONSULTANT AND ANY TEST RESULTS WILL BE SENT TO YOUR REFERRING PHYSICIAN.  Pump removed PICC line flushed Follow up as scheduled Please call the clinic if you have any questions or concerns   Thank you for choosing [place] at [facility] to provide your oncology and hematology care.  To afford each patient quality time with our provider, please arrive at least 15 minutes before your scheduled appointment time.   Beginning January 23rd 2017 lab work for the [facility] will be done in the  Main lab at [facility] on 1st floor. If you have a lab appointment with the [place] please come in thru the  Main Entrance and check in at the main information desk  You need to re-schedule your appointment should you arrive 10 or more minutes late.  We strive to give you quality time with our providers, and arriving late affects you and other patients whose appointments are after yours.  Also, if you no show three or more times for appointments you may be dismissed from the clinic at the providers discretion.     Again, thank you for choosing [facility].  Our hope is that these requests will decrease the amount of time that you wait before being seen by our physicians.       _____________________________________________________________  Should you have questions after your visit to [facility], please contact our office at [phone] between the hours of 8:30 a.m. and 4:30 p.m.  Voicemails left after 4:30 p.m. will not be returned until the following business day.  For prescription refill requests, have your pharmacy contact our office.         Resources For Cancer Patients and their Caregivers ? American Cancer Society: Can assist with transportation, wigs, general needs, runs Look Good Feel Better.        [phone] ? Cancer  Care: Provides financial assistance, online support groups, medication/co-pay assistance.  1-800-813-HOPE 267-244-6680) ? [place] Assists [place] cancer patients and their families through emotional , educational and financial support.  (318)288-3908 ? [place] DSS Where to apply for food stamps, Medicaid and utility assistance. (208)749-5257 ? RCATS: Transportation to medical appointments. 320-522-3306 ? Social Security Administration: May apply for disability if have a Stage IV cancer. ([phone] 521-6591 815-628-1232 ? [employer], Disability and Transit Services: Assists with nutrition, care and transit needs. [place] Support Programs: @10RELATIVEDAYS @ > Cancer Support Group  2nd Tuesday of the month 1pm-2pm, Journey Room  > Creative Journey  3rd Tuesday of the month 1130am-1pm, Journey Room  > Look Good Feel Better  1st Wednesday of the month 10am-12 noon, Journey Room (Call [place] to register [phone])

## 2016-02-07 ENCOUNTER — Inpatient Hospital Stay (HOSPITAL_COMMUNITY): Payer: BLUE CROSS/BLUE SHIELD

## 2016-02-07 ENCOUNTER — Ambulatory Visit (HOSPITAL_COMMUNITY): Payer: BLUE CROSS/BLUE SHIELD | Admitting: Oncology

## 2016-02-08 ENCOUNTER — Encounter (HOSPITAL_COMMUNITY): Payer: BLUE CROSS/BLUE SHIELD

## 2016-02-09 ENCOUNTER — Encounter (HOSPITAL_COMMUNITY): Payer: BLUE CROSS/BLUE SHIELD

## 2016-02-15 ENCOUNTER — Encounter (HOSPITAL_COMMUNITY): Payer: BLUE CROSS/BLUE SHIELD | Attending: Hematology & Oncology

## 2016-02-15 ENCOUNTER — Inpatient Hospital Stay (HOSPITAL_COMMUNITY): Payer: BLUE CROSS/BLUE SHIELD

## 2016-02-15 VITALS — BP 125/78 | HR 91 | Temp 98.2°F | Resp 18 | Wt 167.9 lb

## 2016-02-15 DIAGNOSIS — C20 Malignant neoplasm of rectum: Secondary | ICD-10-CM | POA: Diagnosis not present

## 2016-02-15 DIAGNOSIS — Z5111 Encounter for antineoplastic chemotherapy: Secondary | ICD-10-CM | POA: Diagnosis not present

## 2016-02-15 LAB — CBC WITH DIFFERENTIAL/PLATELET
Basophils Absolute: 0.1 10*3/uL (ref 0.0–0.1)
Basophils Relative: 1 %
Eosinophils Absolute: 0.2 10*3/uL (ref 0.0–0.7)
Eosinophils Relative: 4 %
HCT: 39.7 % (ref 39.0–52.0)
Hemoglobin: 13.3 g/dL (ref 13.0–17.0)
LYMPHS PCT: 22 %
Lymphs Abs: 1.1 10*3/uL (ref 0.7–4.0)
MCH: 29.8 pg (ref 26.0–34.0)
MCHC: 33.5 g/dL (ref 30.0–36.0)
MCV: 88.8 fL (ref 78.0–100.0)
MONO ABS: 1.1 10*3/uL — AB (ref 0.1–1.0)
MONOS PCT: 22 %
NEUTROS ABS: 2.6 10*3/uL (ref 1.7–7.7)
Neutrophils Relative %: 51 %
Platelets: 120 10*3/uL — ABNORMAL LOW (ref 150–400)
RBC: 4.47 MIL/uL (ref 4.22–5.81)
RDW: 17.5 % — AB (ref 11.5–15.5)
WBC: 5 10*3/uL (ref 4.0–10.5)

## 2016-02-15 LAB — COMPREHENSIVE METABOLIC PANEL
ALBUMIN: 4 g/dL (ref 3.5–5.0)
ALT: 36 U/L (ref 17–63)
ANION GAP: 6 (ref 5–15)
AST: 34 U/L (ref 15–41)
Alkaline Phosphatase: 84 U/L (ref 38–126)
BUN: 11 mg/dL (ref 6–20)
CO2: 26 mmol/L (ref 22–32)
Calcium: 8.9 mg/dL (ref 8.9–10.3)
Chloride: 105 mmol/L (ref 101–111)
Creatinine, Ser: 0.91 mg/dL (ref 0.61–1.24)
GFR calc non Af Amer: >60 mL/min (ref >60)
GLUCOSE: 117 mg/dL — AB (ref 65–99)
POTASSIUM: 4.2 mmol/L (ref 3.5–5.1)
SODIUM: 137 mmol/L (ref 135–145)
TOTAL PROTEIN: 6.9 g/dL (ref 6.5–8.1)
Total Bilirubin: 0.8 mg/dL (ref 0.3–1.2)

## 2016-02-15 MED ORDER — DEXTROSE 5 % IV SOLN
85.0000 mg/m2 | Freq: Once | INTRAVENOUS | Status: AC
  Administered 2016-02-15: 165 mg via INTRAVENOUS
  Filled 2016-02-15: qty 33

## 2016-02-15 MED ORDER — SODIUM CHLORIDE 0.9% FLUSH: 10.0000 mL | INTRAVENOUS | Status: DC | PRN

## 2016-02-15 MED ORDER — FLUOROURACIL CHEMO INJECTION 2.5 GM/50ML
400.0000 mg/m2 | Freq: Once | INTRAVENOUS | Status: AC
  Administered 2016-02-15: 750 mg via INTRAVENOUS
  Filled 2016-02-15: qty 15

## 2016-02-15 MED ORDER — SODIUM CHLORIDE 0.9 % IV SOLN
10.0000 mg | Freq: Once | INTRAVENOUS | Status: AC
  Administered 2016-02-15: 10 mg via INTRAVENOUS
  Filled 2016-02-15: qty 1

## 2016-02-15 MED ORDER — HEPARIN SOD (PORK) LOCK FLUSH 100 UNIT/ML IV SOLN
250.0000 [IU] | Freq: Once | INTRAVENOUS | Status: DC | PRN
  Filled 2016-02-15: qty 5

## 2016-02-15 MED ORDER — SODIUM CHLORIDE 0.9 % IV SOLN
2400.0000 mg/m2 | INTRAVENOUS | Status: DC
  Administered 2016-02-15: 4650 mg via INTRAVENOUS
  Filled 2016-02-15: qty 93

## 2016-02-15 MED ORDER — DEXTROSE 5 % IV SOLN
Freq: Once | INTRAVENOUS | Status: AC
  Administered 2016-02-15: 13:00:00 via INTRAVENOUS

## 2016-02-15 MED ORDER — PALONOSETRON HCL INJECTION 0.25 MG/5ML
0.2500 mg | Freq: Once | INTRAVENOUS | Status: AC
  Administered 2016-02-15: 0.25 mg via INTRAVENOUS
  Filled 2016-02-15: qty 5

## 2016-02-15 MED ORDER — LEUCOVORIN CALCIUM INJECTION 350 MG
400.0000 mg/m2 | Freq: Once | INTRAVENOUS | Status: AC
  Administered 2016-02-15: 772 mg via INTRAVENOUS
  Filled 2016-02-15: qty 38.6

## 2016-02-16 ENCOUNTER — Encounter (HOSPITAL_COMMUNITY): Payer: Self-pay

## 2016-02-16 NOTE — Patient Instructions (Signed)
[facility] Discharge Instructions for Patients Receiving Chemotherapy   Beginning January 23rd 2017 lab work for the [facility] will be done in the  Main lab at [facility] on 1st floor. If you have a lab appointment with the [place] please come in thru the  Main Entrance and check in at the main information desk   Today you received the following chemotherapy agents:  Oxaliplatin, leucovorin, and 5FU.  If you develop nausea and vomiting, or diarrhea that is not controlled by your medication, call the clinic.  The clinic phone number is [phone]. Office hours are Monday-Friday 8:30am-5:00pm.  BELOW ARE SYMPTOMS THAT SHOULD BE REPORTED IMMEDIATELY:  *FEVER GREATER THAN 101.0 F  *CHILLS WITH OR WITHOUT FEVER  NAUSEA AND VOMITING THAT IS NOT CONTROLLED WITH YOUR NAUSEA MEDICATION  *UNUSUAL SHORTNESS OF BREATH  *UNUSUAL BRUISING OR BLEEDING  TENDERNESS IN MOUTH AND THROAT WITH OR WITHOUT PRESENCE OF ULCERS  *URINARY PROBLEMS  *BOWEL PROBLEMS  UNUSUAL RASH Items with * indicate a potential emergency and should be followed up as soon as possible. If you have an emergency after office hours please contact your primary care physician or go to the nearest emergency department.  Please call the clinic during office hours if you have any questions or concerns.   You may also contact the Patient Navigator at [phone] should you have any questions or need assistance in obtaining follow up care.      Resources For Cancer Patients and their Caregivers ? American Cancer Society: Can assist with transportation, wigs, general needs, runs Look Good Feel Better.        647-886-7134 ? Cancer Care: Provides financial assistance, online support groups, medication/co-pay assistance.  1-800-813-HOPE 272-370-6195) ? [place] Assists [place] cancer patients and their families through emotional , educational and financial support.   (364)522-2823 ? [place] DSS Where to apply for food stamps, Medicaid and utility assistance. 519-504-4997 ? RCATS: Transportation to medical appointments. 6235478515 ? Social Security Administration: May apply for disability if have a Stage IV cancer. [phone] 913-127-8594 ? [employer], Disability and Transit Services: Assists with nutrition, care and transit needs. [phone]

## 2016-02-17 ENCOUNTER — Encounter (HOSPITAL_COMMUNITY): Payer: BLUE CROSS/BLUE SHIELD

## 2016-02-17 ENCOUNTER — Encounter (HOSPITAL_BASED_OUTPATIENT_CLINIC_OR_DEPARTMENT_OTHER): Payer: BLUE CROSS/BLUE SHIELD

## 2016-02-17 ENCOUNTER — Ambulatory Visit (HOSPITAL_COMMUNITY): Payer: BLUE CROSS/BLUE SHIELD | Admitting: Oncology

## 2016-02-17 ENCOUNTER — Encounter (HOSPITAL_COMMUNITY): Payer: Self-pay

## 2016-02-17 VITALS — BP 117/78 | HR 96 | Temp 98.1°F | Resp 18

## 2016-02-17 DIAGNOSIS — C20 Malignant neoplasm of rectum: Secondary | ICD-10-CM | POA: Diagnosis not present

## 2016-02-17 MED ORDER — HEPARIN SOD (PORK) LOCK FLUSH 100 UNIT/ML IV SOLN
500.0000 [IU] | Freq: Once | INTRAVENOUS | Status: AC | PRN
  Administered 2016-02-17: 500 [IU]

## 2016-02-17 MED ORDER — SODIUM CHLORIDE 0.9% FLUSH
10.0000 mL | INTRAVENOUS | Status: DC | PRN
  Administered 2016-02-17: 10 mL
  Filled 2016-02-17: qty 10

## 2016-02-17 NOTE — Patient Instructions (Signed)
[place] at [facility] Discharge Instructions  RECOMMENDATIONS MADE BY THE CONSULTANT AND ANY TEST RESULTS WILL BE SENT TO YOUR REFERRING PHYSICIAN.  Pump removal and PICC flush today. Return as scheduled for chemotherapy and office visit. Call the clinic should you have any questions or concerns prior to your next visit.   Thank you for choosing [place] at [facility] to provide your oncology and hematology care.  To afford each patient quality time with our provider, please arrive at least 15 minutes before your scheduled appointment time.   Beginning January 23rd 2017 lab work for the [facility] will be done in the  Main lab at [facility] on 1st floor. If you have a lab appointment with the [place] please come in thru the  Main Entrance and check in at the main information desk  You need to re-schedule your appointment should you arrive 10 or more minutes late.  We strive to give you quality time with our providers, and arriving late affects you and other patients whose appointments are after yours.  Also, if you no show three or more times for appointments you may be dismissed from the clinic at the providers discretion.     Again, thank you for choosing [facility].  Our hope is that these requests will decrease the amount of time that you wait before being seen by our physicians.       _____________________________________________________________  Should you have questions after your visit to [facility], please contact our office at [phone] between the hours of 8:30 a.m. and 4:30 p.m.  Voicemails left after 4:30 p.m. will not be returned until the following business day.  For prescription refill requests, have your pharmacy contact our office.         Resources For Cancer Patients and their Caregivers ? American Cancer Society: Can assist with transportation, wigs, general needs, runs  Look Good Feel Better.        6146484447 ? Cancer Care: Provides financial assistance, online support groups, medication/co-pay assistance.  1-800-813-HOPE 323-614-4173) ? [place] Assists [place] cancer patients and their families through emotional , educational and financial support.  386-654-6720 ? [place] DSS Where to apply for food stamps, Medicaid and utility assistance. 765-838-6048 ? RCATS: Transportation to medical appointments. 614 741 0388 ? Social Security Administration: May apply for disability if have a Stage IV cancer. 318 118 8363 2248868660 ? [employer], Disability and Transit Services: Assists with nutrition, care and transit needs. [place] Support Programs: @10RELATIVEDAYS @ > Cancer Support Group  2nd Tuesday of the month 1pm-2pm, Journey Room  > Creative Journey  3rd Tuesday of the month 1130am-1pm, Journey Room  > Look Good Feel Better  1st Wednesday of the month 10am-12 noon, Journey Room (Call [place] to register [phone])

## 2016-02-17 NOTE — Progress Notes (Signed)
1500:  Jonathan Wright presents to have home infusion pump d/c'd and for PICC flush. PICC located right arm.  Good blood return present. PICC flushed with NS and 250 units heparin flush.  Procedure tolerated well and without incident.

## 2016-02-21 ENCOUNTER — Encounter (HOSPITAL_COMMUNITY): Payer: BLUE CROSS/BLUE SHIELD

## 2016-02-21 ENCOUNTER — Inpatient Hospital Stay (HOSPITAL_COMMUNITY): Payer: BLUE CROSS/BLUE SHIELD

## 2016-02-21 ENCOUNTER — Ambulatory Visit (HOSPITAL_COMMUNITY): Payer: BLUE CROSS/BLUE SHIELD | Admitting: Hematology & Oncology

## 2016-02-23 ENCOUNTER — Encounter (HOSPITAL_COMMUNITY): Payer: BLUE CROSS/BLUE SHIELD

## 2016-02-29 ENCOUNTER — Encounter (HOSPITAL_COMMUNITY): Payer: BLUE CROSS/BLUE SHIELD | Admitting: Hematology & Oncology

## 2016-02-29 ENCOUNTER — Inpatient Hospital Stay (HOSPITAL_COMMUNITY): Payer: BLUE CROSS/BLUE SHIELD

## 2016-02-29 ENCOUNTER — Encounter (HOSPITAL_COMMUNITY): Payer: BLUE CROSS/BLUE SHIELD | Attending: Hematology & Oncology

## 2016-02-29 VITALS — BP 118/76 | HR 75 | Temp 98.0°F | Resp 18

## 2016-02-29 DIAGNOSIS — C20 Malignant neoplasm of rectum: Secondary | ICD-10-CM | POA: Diagnosis not present

## 2016-02-29 DIAGNOSIS — Z5111 Encounter for antineoplastic chemotherapy: Secondary | ICD-10-CM

## 2016-02-29 LAB — COMPREHENSIVE METABOLIC PANEL
ALT: 165 U/L — AB (ref 17–63)
ANION GAP: 5 (ref 5–15)
AST: 81 U/L — ABNORMAL HIGH (ref 15–41)
Albumin: 3.9 g/dL (ref 3.5–5.0)
Alkaline Phosphatase: 87 U/L (ref 38–126)
BUN: 16 mg/dL (ref 6–20)
CHLORIDE: 110 mmol/L (ref 101–111)
CO2: 24 mmol/L (ref 22–32)
CREATININE: 0.98 mg/dL (ref 0.61–1.24)
Calcium: 9 mg/dL (ref 8.9–10.3)
Glucose, Bld: 155 mg/dL — ABNORMAL HIGH (ref 65–99)
POTASSIUM: 4.3 mmol/L (ref 3.5–5.1)
SODIUM: 139 mmol/L (ref 135–145)
Total Bilirubin: 0.6 mg/dL (ref 0.3–1.2)
Total Protein: 6.9 g/dL (ref 6.5–8.1)

## 2016-02-29 LAB — CBC WITH DIFFERENTIAL/PLATELET
Basophils Absolute: 0 10*3/uL (ref 0.0–0.1)
Basophils Relative: 1 %
EOS ABS: 0.2 10*3/uL (ref 0.0–0.7)
EOS PCT: 4 %
HCT: 39.1 % (ref 39.0–52.0)
Hemoglobin: 12.8 g/dL — ABNORMAL LOW (ref 13.0–17.0)
LYMPHS ABS: 1.2 10*3/uL (ref 0.7–4.0)
LYMPHS PCT: 27 %
MCH: 30 pg (ref 26.0–34.0)
MCHC: 32.7 g/dL (ref 30.0–36.0)
MCV: 91.8 fL (ref 78.0–100.0)
MONO ABS: 0.9 10*3/uL (ref 0.1–1.0)
MONOS PCT: 20 %
Neutro Abs: 2.1 10*3/uL (ref 1.7–7.7)
Neutrophils Relative %: 47 %
PLATELETS: 100 10*3/uL — AB (ref 150–400)
RBC: 4.26 MIL/uL (ref 4.22–5.81)
RDW: 19.1 % — AB (ref 11.5–15.5)
WBC: 4.5 10*3/uL (ref 4.0–10.5)

## 2016-02-29 MED ORDER — OXALIPLATIN CHEMO INJECTION 100 MG/20ML
85.0000 mg/m2 | Freq: Once | INTRAVENOUS | Status: AC
  Administered 2016-02-29: 165 mg via INTRAVENOUS
  Filled 2016-02-29: qty 33

## 2016-02-29 MED ORDER — FLUOROURACIL CHEMO INJECTION 2.5 GM/50ML
400.0000 mg/m2 | Freq: Once | INTRAVENOUS | Status: AC
  Administered 2016-02-29: 750 mg via INTRAVENOUS
  Filled 2016-02-29: qty 15

## 2016-02-29 MED ORDER — LEUCOVORIN CALCIUM INJECTION 350 MG
400.0000 mg/m2 | Freq: Once | INTRAVENOUS | Status: AC
  Administered 2016-02-29: 772 mg via INTRAVENOUS
  Filled 2016-02-29: qty 38.6

## 2016-02-29 MED ORDER — SODIUM CHLORIDE 0.9 % IV SOLN
10.0000 mg | Freq: Once | INTRAVENOUS | Status: AC
  Administered 2016-02-29: 10 mg via INTRAVENOUS
  Filled 2016-02-29: qty 1

## 2016-02-29 MED ORDER — SODIUM CHLORIDE 0.9% FLUSH: 10.0000 mL | INTRAVENOUS | Status: DC | PRN

## 2016-02-29 MED ORDER — PROCHLORPERAZINE MALEATE 10 MG PO TABS: 10.0000 mg | ORAL_TABLET | Freq: Four times a day (QID) | ORAL | Status: DC | PRN

## 2016-02-29 MED ORDER — FLUOROURACIL CHEMO INJECTION 5 GM/100ML
2400.0000 mg/m2 | INTRAVENOUS | Status: AC
  Administered 2016-02-29: 4650 mg via INTRAVENOUS
  Filled 2016-02-29: qty 93

## 2016-02-29 MED ORDER — DEXTROSE 5 % IV SOLN
Freq: Once | INTRAVENOUS | Status: AC
  Administered 2016-02-29: 12:00:00 via INTRAVENOUS

## 2016-02-29 MED ORDER — PALONOSETRON HCL INJECTION 0.25 MG/5ML
0.2500 mg | Freq: Once | INTRAVENOUS | Status: AC
  Administered 2016-02-29: 0.25 mg via INTRAVENOUS
  Filled 2016-02-29: qty 5

## 2016-02-29 NOTE — Progress Notes (Unsigned)
Dr. Whitney Muse aware of AST and ALT results.  Okay to tx today per MD.   Tolerated tx w/o adverse reaction.  A&Ox4, in no distress.  VSS.  Discharged ambulatory.

## 2016-03-02 ENCOUNTER — Encounter (HOSPITAL_BASED_OUTPATIENT_CLINIC_OR_DEPARTMENT_OTHER): Payer: BLUE CROSS/BLUE SHIELD

## 2016-03-02 VITALS — BP 119/82 | HR 79 | Temp 98.3°F | Resp 20

## 2016-03-02 DIAGNOSIS — Z452 Encounter for adjustment and management of vascular access device: Secondary | ICD-10-CM | POA: Diagnosis not present

## 2016-03-02 DIAGNOSIS — C20 Malignant neoplasm of rectum: Secondary | ICD-10-CM

## 2016-03-02 MED ORDER — SODIUM CHLORIDE 0.9% FLUSH
10.0000 mL | INTRAVENOUS | Status: DC | PRN
  Administered 2016-03-02: 10 mL
  Filled 2016-03-02: qty 10

## 2016-03-02 MED ORDER — HEPARIN SOD (PORK) LOCK FLUSH 100 UNIT/ML IV SOLN
INTRAVENOUS | Status: AC
Start: 2016-03-02 — End: 2016-03-02
  Filled 2016-03-02: qty 5

## 2016-03-02 MED ORDER — HEPARIN SOD (PORK) LOCK FLUSH 100 UNIT/ML IV SOLN
500.0000 [IU] | Freq: Once | INTRAVENOUS | Status: AC | PRN
  Administered 2016-03-02: 500 [IU]

## 2016-03-02 NOTE — Progress Notes (Signed)
Denies complaints post chemo. Continuous infusion pump completed. Disconnected pump from patient and flushed PICC line per protocol. Patient ambulatory on discharge home with friend.

## 2016-03-02 NOTE — Patient Instructions (Signed)
[place] at [facility] Discharge Instructions  RECOMMENDATIONS MADE BY THE CONSULTANT AND ANY TEST RESULTS WILL BE SENT TO YOUR REFERRING PHYSICIAN.  Disconnected continuous infusion pump. Flushed PICC line per protocol. Return as scheduled.  Thank you for choosing [place] at [facility] to provide your oncology and hematology care.  To afford each patient quality time with our provider, please arrive at least 15 minutes before your scheduled appointment time.   Beginning January 23rd 2017 lab work for the [facility] will be done in the  Main lab at [facility] on 1st floor. If you have a lab appointment with the [place] please come in thru the  Main Entrance and check in at the main information desk  You need to re-schedule your appointment should you arrive 10 or more minutes late.  We strive to give you quality time with our providers, and arriving late affects you and other patients whose appointments are after yours.  Also, if you no show three or more times for appointments you may be dismissed from the clinic at the providers discretion.     Again, thank you for choosing [facility].  Our hope is that these requests will decrease the amount of time that you wait before being seen by our physicians.       _____________________________________________________________  Should you have questions after your visit to [facility], please contact our office at [phone] between the hours of 8:30 a.m. and 4:30 p.m.  Voicemails left after 4:30 p.m. will not be returned until the following business day.  For prescription refill requests, have your pharmacy contact our office.         Resources For Cancer Patients and their Caregivers ? American Cancer Society: Can assist with transportation, wigs, general needs, runs Look Good Feel Better.        9563775790 ? Cancer Care: Provides financial  assistance, online support groups, medication/co-pay assistance.  1-800-813-HOPE 858-180-6270) ? [place] Assists [place] cancer patients and their families through emotional , educational and financial support.  780-563-9477 ? [place] DSS Where to apply for food stamps, Medicaid and utility assistance. 402-530-5287 ? RCATS: Transportation to medical appointments. ([phone] ? Social Security Administration: May apply for disability if have a Stage IV cancer. [phone] (913)436-6365 ? [employer], Disability and Transit Services: Assists with nutrition, care and transit needs. [place] Support Programs: @10RELATIVEDAYS @ > Cancer Support Group  2nd Tuesday of the month 1pm-2pm, Journey Room  > Creative Journey  3rd Tuesday of the month 1130am-1pm, Journey Room  > Look Good Feel Better  1st Wednesday of the month 10am-12 noon, Journey Room (Call [place] to register [phone])

## 2016-03-06 ENCOUNTER — Ambulatory Visit (HOSPITAL_COMMUNITY): Payer: BLUE CROSS/BLUE SHIELD | Admitting: Hematology & Oncology

## 2016-03-06 ENCOUNTER — Inpatient Hospital Stay (HOSPITAL_COMMUNITY): Payer: BLUE CROSS/BLUE SHIELD

## 2016-03-08 ENCOUNTER — Encounter (HOSPITAL_COMMUNITY): Payer: BLUE CROSS/BLUE SHIELD

## 2016-03-14 ENCOUNTER — Encounter (HOSPITAL_BASED_OUTPATIENT_CLINIC_OR_DEPARTMENT_OTHER): Payer: BLUE CROSS/BLUE SHIELD | Admitting: Hematology & Oncology

## 2016-03-14 ENCOUNTER — Encounter (HOSPITAL_BASED_OUTPATIENT_CLINIC_OR_DEPARTMENT_OTHER): Payer: BLUE CROSS/BLUE SHIELD

## 2016-03-14 ENCOUNTER — Encounter (HOSPITAL_COMMUNITY): Payer: Self-pay | Admitting: Hematology & Oncology

## 2016-03-14 ENCOUNTER — Inpatient Hospital Stay (HOSPITAL_COMMUNITY): Payer: BLUE CROSS/BLUE SHIELD

## 2016-03-14 VITALS — BP 135/93 | HR 95 | Temp 98.2°F | Resp 16

## 2016-03-14 DIAGNOSIS — C20 Malignant neoplasm of rectum: Secondary | ICD-10-CM | POA: Diagnosis not present

## 2016-03-14 DIAGNOSIS — Z5111 Encounter for antineoplastic chemotherapy: Secondary | ICD-10-CM

## 2016-03-14 LAB — CBC WITH DIFFERENTIAL/PLATELET
BASOS ABS: 0 10*3/uL (ref 0.0–0.1)
BASOS PCT: 1 %
EOS ABS: 0.1 10*3/uL (ref 0.0–0.7)
EOS PCT: 2 %
HCT: 40.4 % (ref 39.0–52.0)
Hemoglobin: 13.7 g/dL (ref 13.0–17.0)
Lymphocytes Relative: 24 %
Lymphs Abs: 1 10*3/uL (ref 0.7–4.0)
MCH: 31.3 pg (ref 26.0–34.0)
MCHC: 33.9 g/dL (ref 30.0–36.0)
MCV: 92.2 fL (ref 78.0–100.0)
MONO ABS: 1.1 10*3/uL — AB (ref 0.1–1.0)
Monocytes Relative: 27 %
Neutro Abs: 1.9 10*3/uL (ref 1.7–7.7)
Neutrophils Relative %: 46 %
PLATELETS: 110 10*3/uL — AB (ref 150–400)
RBC: 4.38 MIL/uL (ref 4.22–5.81)
RDW: 19.3 % — AB (ref 11.5–15.5)
WBC: 4.1 10*3/uL (ref 4.0–10.5)

## 2016-03-14 LAB — COMPREHENSIVE METABOLIC PANEL
ALBUMIN: 4 g/dL (ref 3.5–5.0)
ALT: 97 U/L — ABNORMAL HIGH (ref 17–63)
AST: 66 U/L — AB (ref 15–41)
Alkaline Phosphatase: 72 U/L (ref 38–126)
Anion gap: 6 (ref 5–15)
BUN: 11 mg/dL (ref 6–20)
CHLORIDE: 105 mmol/L (ref 101–111)
CO2: 26 mmol/L (ref 22–32)
Calcium: 9.4 mg/dL (ref 8.9–10.3)
Creatinine, Ser: 0.83 mg/dL (ref 0.61–1.24)
GFR calc Af Amer: >60 mL/min (ref >60)
Glucose, Bld: 89 mg/dL (ref 65–99)
POTASSIUM: 4.3 mmol/L (ref 3.5–5.1)
SODIUM: 137 mmol/L (ref 135–145)
Total Bilirubin: 0.7 mg/dL (ref 0.3–1.2)
Total Protein: 7.2 g/dL (ref 6.5–8.1)

## 2016-03-14 MED ORDER — OXALIPLATIN CHEMO INJECTION 100 MG/20ML
85.0000 mg/m2 | Freq: Once | INTRAVENOUS | Status: AC
  Administered 2016-03-14: 165 mg via INTRAVENOUS
  Filled 2016-03-14: qty 33

## 2016-03-14 MED ORDER — DEXTROSE 5 % IV SOLN
Freq: Once | INTRAVENOUS | Status: AC
  Administered 2016-03-14: 13:00:00 via INTRAVENOUS

## 2016-03-14 MED ORDER — SODIUM CHLORIDE 0.9% FLUSH: 10.0000 mL | INTRAVENOUS | Status: DC | PRN

## 2016-03-14 MED ORDER — SODIUM CHLORIDE 0.9 % IV SOLN
2400.0000 mg/m2 | INTRAVENOUS | Status: DC
  Administered 2016-03-14: 4650 mg via INTRAVENOUS
  Filled 2016-03-14: qty 85

## 2016-03-14 MED ORDER — SODIUM CHLORIDE 0.9 % IV SOLN
10.0000 mg | Freq: Once | INTRAVENOUS | Status: AC
  Administered 2016-03-14: 10 mg via INTRAVENOUS
  Filled 2016-03-14: qty 1

## 2016-03-14 MED ORDER — HEPARIN SOD (PORK) LOCK FLUSH 100 UNIT/ML IV SOLN
250.0000 [IU] | Freq: Once | INTRAVENOUS | Status: DC | PRN
  Filled 2016-03-14: qty 5

## 2016-03-14 MED ORDER — LEUCOVORIN CALCIUM INJECTION 350 MG
400.0000 mg/m2 | Freq: Once | INTRAVENOUS | Status: AC
  Administered 2016-03-14: 772 mg via INTRAVENOUS
  Filled 2016-03-14: qty 17.5

## 2016-03-14 MED ORDER — PALONOSETRON HCL INJECTION 0.25 MG/5ML
0.2500 mg | Freq: Once | INTRAVENOUS | Status: AC
  Administered 2016-03-14: 0.25 mg via INTRAVENOUS
  Filled 2016-03-14: qty 5

## 2016-03-14 MED ORDER — FLUOROURACIL CHEMO INJECTION 2.5 GM/50ML
400.0000 mg/m2 | Freq: Once | INTRAVENOUS | Status: AC
  Administered 2016-03-14: 750 mg via INTRAVENOUS
  Filled 2016-03-14: qty 10

## 2016-03-14 MED ORDER — TRAMADOL HCL 50 MG PO TABS: 50.0000 mg | ORAL_TABLET | Freq: Four times a day (QID) | ORAL | 0 refills | Status: DC | PRN

## 2016-03-14 NOTE — Progress Notes (Signed)
[place]  Progress Note  Patient Care Team: [name], MD as PCP - General (Family Medicine)  CHIEF COMPLAINTS/PURPOSE OF CONSULTATION:   Oncology History   Stage IIIB rectal adenocarcinoma, mT2N2M0 [facility]     Rectal cancer [facility])   09/20/2015 Procedure    colonoscopy with firm rectal mass and 4 cm polyp in proximal rectum, infiltrating non-obstructing large mass within the distal rectum extending towards anal verge     09/20/2015 Miscellaneous    Microsatellite stable.      09/28/2015 Imaging    CT C/A/P no evidence of distant metastatic disease. irregular thickening of the low rectum and anal canal, several enlarged perirectal LN, indeterminate 76m RUL nodule     09/28/2015 Imaging    MRI pelvis assym diffuse circum lobular thickening of rectum, near full wall thickness extension along lower anterior rectum W/O extra serosal extension c/w known adeno, mult. perirectal LN     12/06/2015 - 12/10/2015 Radiation Therapy    [facility] Dr. AAngelina Ok  2500.00 cGy, 500.00 cGy per day in 5 fractions delivered 1x per day to the 96.0% Isodose line Treatment Dates: 12/06/2015 through 12/10/2015.      01/03/2016 Procedure    PICC placed     01/04/2016 -  Chemotherapy    FOLFOX x 6 cycles      HISTORY OF PRESENTING ILLNESS:  Jonathan Crochet89y.o. male is here for further follow-up of adenocarcinoma of the rectum. Tumor extends from the recto sigmoid junction to the anal verge. [facility]   Mr. [place] room[name].  He is here for Cycle #6 FOLFOX.  Reports feeling more tired, attributing this partially to the heat and partially to coming in for treatment. He admits he began to feel a sense of dread approaching Tuesday. He becomes  frustrated by not being able to drink liquids cold or even room temperature, he has to have all liquids heated.   Reports he is unable to touch anything in the refrigerator after treatment. He denies any issue buttoning shirts or typing. No neuropathy. Only cold induced neuropathy post therapy to about 3 days prior to his next cycle.   He recently refilled his nausea medication, stating "I need that".   States everything has been pretty much the same. His appetite is good and he continues to stay active.  [place]   No other major complaints or concerns. [facility] He is going to leave his PICC line in until he returns there noting that he was advised it may be used on his return.   MEDICAL HISTORY:  Past Medical History:  Diagnosis Date  . Anxiety   . Depression     SURGICAL HISTORY: No past surgical history on file.  SOCIAL HISTORY: Social History   Social History  . Marital status: Divorced    Spouse name: [name]  . Number of children: [name]  . Years of education: [name]   Occupational History  . Not on file.   Social History Main Topics  . Smoking status: Never Smoker  . Smokeless tobacco: Not on file  . Alcohol use Yes     Comment: occ.   . Drug use: No  .  Sexual activity: Not on file   Other Topics Concern  . Not on file   Social History Narrative  . 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 Mother-Healthy Father-deceased; late 62s early 25s. Doesn't know much about him 2 brothers-both healthy No other cancer in his family ALLERGIES:  is allergic to penicillins and penicillin g.  MEDICATIONS:  Current Outpatient Prescriptions  Medication Sig Dispense Refill  . clonazePAM (KLONOPIN) 0.5 MG tablet Take 1 tablet  (0.5 mg total) by mouth at bedtime. May take 2 tablet if needed 60 tablet 0  . dextrose 5 % SOLN 1,000 mL with fluorouracil 5 GM/100ML SOLN Inject into the vein. To infuse over 46 hours every 14 days. To start May 8    . Heparin Lock Flush (HEPARIN FLUSH, PORCINE,) 100 UNIT/ML injection Flush picc line with 2.48m (250 units) of heparin lock flush on MWF weekly 15 Syringe 0  . leucovorin 50 MG injection Inject into the vein daily. Every 14 days x 6 cycles. Starting May 8.    . ondansetron (ZOFRAN) 8 MG tablet Take 1 tablet (8 mg total) by mouth every 8 (eight) hours as needed for nausea or vomiting. 30 tablet 2  . OXALIPLATIN IV Inject into the vein. Every 14 days x 6 cycles. Starting May 8.    . oxyCODONE (OXY IR/ROXICODONE) 5 MG immediate release tablet Take 1 tablet (5 mg total) by mouth every 6 (six) hours as needed for severe pain (take 1-2 tabs po every 6 hrs as needed). 30 tablet 0  . prochlorperazine (COMPAZINE) 10 MG tablet Take 1 tablet (10 mg total) by mouth every 6 (six) hours as needed for nausea or vomiting. 30 tablet 2  . Sodium Chloride Flush (NORMAL SALINE FLUSH) 0.9 % SOLN Flush picc line with 10 ml of normal saline on MWF weekly. 20 Syringe 0  . traMADol (ULTRAM) 50 MG tablet Take by mouth. Reported on 01/04/2016     No current facility-administered medications for this visit.    Facility-Administered Medications Ordered in Other Visits  Medication Dose Route Frequency Provider Last Rate Last Dose  . sodium chloride flush (NS) 0.9 % injection 10 mL  10 mL Intracatheter PRN SPatrici Ranks MD        Review of Systems  Constitutional: Negative.  Negative for chills, fever, malaise/fatigue and weight loss.  HENT: Negative.  Negative for congestion, hearing loss, nosebleeds, sore throat and tinnitus.   Eyes: Negative.  Negative for blurred vision, double vision, pain and discharge.  Respiratory: Negative.  Negative for cough, hemoptysis, sputum production, shortness of breath  and wheezing.   Cardiovascular: Negative.  Negative for chest pain, palpitations, claudication, leg swelling and PND.  Gastrointestinal: Negative for abdominal pain, blood in stool, constipation, diarrhea, heartburn, melena, nausea and vomiting.  Genitourinary: Negative.  Negative for dysuria, frequency, hematuria and urgency.  Musculoskeletal: Negative.  Negative for falls, joint pain and myalgias.  Skin: Negative.  Negative for itching and rash.  Neurological: Negative.  Negative for dizziness, tingling, tremors, sensory change, speech change, focal weakness, seizures, loss of consciousness, weakness and headaches.  Endo/Heme/Allergies: Negative.  Does not bruise/bleed easily.  Psychiatric/Behavioral: Negative.  Negative for depression, memory loss, substance abuse and suicidal ideas. The patient is not nervous/anxious and does not have insomnia.   All other systems  reviewed and are negative. 14 point ROS was done and is otherwise as detailed above or in HPI    PHYSICAL EXAMINATION: ECOG PERFORMANCE STATUS: 1 - Symptomatic but completely ambulatory  Vitals with BMI 03/14/2016  Height   Weight 171 lbs 6 oz  BMI   Systolic 626  Diastolic 71  Pulse 91  Respirations 16    Physical Exam  Constitutional: He is oriented to person, place, and time and well-developed, well-nourished, and in no distress.  HENT:  Head: Normocephalic and atraumatic.  Nose: Nose normal.  Mouth/Throat: Oropharynx is clear and moist. No oropharyngeal exudate.  Eyes: Conjunctivae and EOM are normal. Pupils are equal, round, and reactive to light. Right eye exhibits no discharge. Left eye exhibits no discharge. No scleral icterus.  Neck: Normal range of motion. Neck supple. No tracheal deviation present. No thyromegaly present.  Cardiovascular: Normal rate, regular rhythm and normal heart sounds.  Exam reveals no gallop and no friction rub.   No murmur heard. Pulmonary/Chest: Effort normal and breath sounds  normal. He has no wheezes. He has no rales.  Abdominal: Soft. Bowel sounds are normal. He exhibits no distension and no mass. There is no tenderness. There is no rebound and no guarding.  Musculoskeletal: Normal range of motion. He exhibits no edema.  Lymphadenopathy:    He has no cervical adenopathy.  Neurological: He is alert and oriented to person, place, and time. He has normal reflexes. No cranial nerve deficit. Gait normal. Coordination normal.  Skin: Skin is warm and dry. No rash noted.  Psychiatric: Mood, memory, affect and judgment normal.  Nursing note and vitals reviewed.  LABORATORY DATA:  I have reviewed the data as listed  Results for KYIAN, OBST (MRN [id number]) as of 03/14/2016 11:04  Results for CAMIL, WILHELMSEN (MRN [id number]) as of 03/14/2016 18:40  Ref. Range 03/14/2016 11:22  Sodium Latest Ref Range: 135 - 145 mmol/L 137  Potassium Latest Ref Range: 3.5 - 5.1 mmol/L 4.3  Chloride Latest Ref Range: 101 - 111 mmol/L 105  CO2 Latest Ref Range: 22 - 32 mmol/L 26  BUN Latest Ref Range: 6 - 20 mg/dL 11  Creatinine Latest Ref Range: 0.61 - 1.24 mg/dL 0.83  Calcium Latest Ref Range: 8.9 - 10.3 mg/dL 9.4  EGFR (Non-African Amer.) Latest Ref Range: >60 mL/min >60  EGFR (African American) Latest Ref Range: >60 mL/min >60  Glucose Latest Ref Range: 65 - 99 mg/dL 89  Anion gap Latest Ref Range: 5 - 15  6  Alkaline Phosphatase Latest Ref Range: 38 - 126 U/L 72  Albumin Latest Ref Range: 3.5 - 5.0 g/dL 4.0  AST Latest Ref Range: 15 - 41 U/L 66 (H)  ALT Latest Ref Range: 17 - 63 U/L 97 (H)  Total Protein Latest Ref Range: 6.5 - 8.1 g/dL 7.2  Total Bilirubin Latest Ref Range: 0.3 - 1.2 mg/dL 0.7  WBC Latest Ref Range: 4.0 - 10.5 K/uL 4.1  RBC Latest Ref Range: 4.22 - 5.81 MIL/uL 4.38  Hemoglobin Latest Ref Range: 13.0 - 17.0 g/dL 13.7  HCT Latest Ref Range: 39.0 - 52.0 % 40.4  MCV Latest Ref Range: 78.0 - 100.0 fL 92.2  MCH Latest Ref Range: 26.0 - 34.0 pg 31.3  MCHC Latest  Ref Range: 30.0 - 36.0 g/dL 33.9  RDW Latest Ref Range: 11.5 - 15.5 % 19.3 (H)  Platelets Latest Ref Range: 150 - 400 K/uL 110 (L)  Neutrophils Latest Units: % 46  Lymphocytes Latest Units: % 24  Monocytes Relative Latest Units: % 27  Eosinophil Latest Units: % 2  Basophil Latest Units: % 1  NEUT# Latest Ref Range: 1.7 - 7.7 K/uL 1.9  Lymphocyte # Latest Ref Range: 0.7 - 4.0 K/uL 1.0  Monocyte # Latest Ref Range: 0.1 - 1.0 K/uL 1.1 (H)  Eosinophils Absolute Latest Ref Range: 0.0 - 0.7 K/uL 0.1  Basophils Absolute Latest Ref Range: 0.0 - 0.1 K/uL 0.0        ASSESSMENT & PLAN:  T2N2aM0 adenocarcinoma of the rectum Tumor extends from rectosigmoid junction to anal verge S/P short course XRT at [facility] FOLFOX Insomnia   The patient is here for Cycle #6 FOLFOX. This is his last cycle of treatment. He will return to [facility] for additional follow-up and ongoing therapy/recommendations.  I have refilled his Tramadol today.  He will return for follow up in 2 months. In the interim, I have advised him that if we can be of any assistance to let us know.  All questions were answered. The patient knows to call the clinic with any problems, questions or concerns.  This document serves as a record of services personally performed by Ancil Linsey, MD. It was created on her behalf by Arlyce Harman, a trained medical scribe. The creation of this record is based on the scribe's personal observations and the provider's statements to them. This document has been checked and approved by the attending provider.  I have reviewed the above documentation for accuracy and completeness, and I agree with the above.  This note was electronically signed.  Molli Hazard, MD  03/14/2016 11:04 AM

## 2016-03-14 NOTE — Patient Instructions (Signed)
[facility] Discharge Instructions for Patients Receiving Chemotherapy   Beginning January 23rd 2017 lab work for the Cancer Center will be done in the  Main lab at [place] on 1st floor. If you have a lab appointment with the Cancer Center please come in thru the  Main Entrance and check in at the main information desk   Today you received the following chemotherapy agents: oxaliplatin, leucovorin, and fluorouracil.     If you develop nausea and vomiting, or diarrhea that is not controlled by your medication, call the clinic.  The clinic phone number is [phone]. Office hours are Monday-Friday 8:30am-5:00pm.  BELOW ARE SYMPTOMS THAT SHOULD BE REPORTED IMMEDIATELY:  *FEVER GREATER THAN 101.0 F  *CHILLS WITH OR WITHOUT FEVER  NAUSEA AND VOMITING THAT IS NOT CONTROLLED WITH YOUR NAUSEA MEDICATION  *UNUSUAL SHORTNESS OF BREATH  *UNUSUAL BRUISING OR BLEEDING  TENDERNESS IN MOUTH AND THROAT WITH OR WITHOUT PRESENCE OF ULCERS  *URINARY PROBLEMS  *BOWEL PROBLEMS  UNUSUAL RASH Items with * indicate a potential emergency and should be followed up as soon as possible. If you have an emergency after office hours please contact your primary care physician or go to the nearest emergency department.  Please call the clinic during office hours if you have any questions or concerns.   You may also contact the Patient Navigator at [phone] should you have any questions or need assistance in obtaining follow up care.      Resources For Cancer Patients and their Caregivers ? American Cancer Society: Can assist with transportation, wigs, general needs, runs Look Good Feel Better.        1-888-227-6333 ? Cancer Care: Provides financial assistance, online support groups, medication/co-pay assistance.  1-800-813-HOPE (4673) ? [facility] Assists [place] cancer patients and their families through emotional , educational and  financial support.  336-427-4357 ? [place] DSS Where to apply for food stamps, Medicaid and utility assistance. 336-342-1394 ? RCATS: Transportation to medical appointments. 336-347-2287 ? Social Security Administration: May apply for disability if have a Stage IV cancer. [phone] 1-800-772-1213 ? [place] Aging, Disability and Transit Services: Assists with nutrition, care and transit needs. [phone]          

## 2016-03-14 NOTE — Progress Notes (Signed)
Patient tolerated infusion well.  VSS.   

## 2016-03-16 ENCOUNTER — Encounter (HOSPITAL_BASED_OUTPATIENT_CLINIC_OR_DEPARTMENT_OTHER): Payer: BLUE CROSS/BLUE SHIELD

## 2016-03-16 DIAGNOSIS — C20 Malignant neoplasm of rectum: Secondary | ICD-10-CM | POA: Diagnosis not present

## 2016-03-16 DIAGNOSIS — Z452 Encounter for adjustment and management of vascular access device: Secondary | ICD-10-CM | POA: Diagnosis not present

## 2016-03-16 MED ORDER — HEPARIN SOD (PORK) LOCK FLUSH 100 UNIT/ML IV SOLN: 300.0000 [IU] | Freq: Once | INTRAVENOUS | Status: DC

## 2016-03-16 MED ORDER — SODIUM CHLORIDE 0.9% FLUSH
10.0000 mL | INTRAVENOUS | Status: DC | PRN
  Administered 2016-03-16: 10 mL
  Filled 2016-03-16: qty 10

## 2016-03-16 MED ORDER — HEPARIN SOD (PORK) LOCK FLUSH 100 UNIT/ML IV SOLN
500.0000 [IU] | Freq: Once | INTRAVENOUS | Status: AC | PRN
  Administered 2016-03-16: 300 [IU]

## 2016-03-16 NOTE — Addendum Note (Signed)
Addended by: Anselmo Rod on: 03/16/2016 04:02 PM   Modules accepted: Orders

## 2016-03-16 NOTE — Progress Notes (Signed)
Jonathan Wright presented for PICC line flush. Proper placement of PICC confirmed by CXR. PICC line located RUA . Good blood return present. PICC line flushed with 16ml NS and 300U/27ml Heparin. Procedure without incident. Patient tolerated procedure well.  Pt's last tx. Tolerated well. 5FU pump discontinued

## 2016-03-31 NOTE — Progress Notes (Signed)
This encounter was created in error - please disregard.

## 2016-04-14 ENCOUNTER — Telehealth (HOSPITAL_COMMUNITY): Payer: Self-pay | Admitting: *Deleted

## 2016-04-18 ENCOUNTER — Telehealth (HOSPITAL_COMMUNITY): Payer: Self-pay | Admitting: Emergency Medicine

## 2016-04-18 NOTE — Telephone Encounter (Signed)
Spoke with Amy at [facility] and we would be glad to follow his lovenox therapy if he continues care with [place], he has no follow up appt at this time.

## 2016-04-27 ENCOUNTER — Telehealth (HOSPITAL_COMMUNITY): Payer: Self-pay | Admitting: Emergency Medicine

## 2016-04-27 NOTE — Telephone Encounter (Signed)
Left a message for pt to call the clinic

## 2016-05-15 ENCOUNTER — Other Ambulatory Visit (HOSPITAL_COMMUNITY): Payer: Self-pay | Admitting: Emergency Medicine

## 2016-05-15 DIAGNOSIS — C20 Malignant neoplasm of rectum: Secondary | ICD-10-CM

## 2016-05-15 MED ORDER — ALPRAZOLAM 0.5 MG PO TABS
0.5000 mg | ORAL_TABLET | Freq: Two times a day (BID) | ORAL | 1 refills | Status: DC | PRN
Start: 2016-05-15 — End: 2017-04-02

## 2016-05-15 MED ORDER — TRAMADOL HCL 50 MG PO TABS: 50.0000 mg | ORAL_TABLET | Freq: Four times a day (QID) | ORAL | 0 refills | Status: DC | PRN

## 2016-05-15 MED ORDER — ESCITALOPRAM OXALATE 10 MG PO TABS: 10.0000 mg | ORAL_TABLET | Freq: Every day | ORAL | 1 refills | Status: DC

## 2016-05-15 MED ORDER — OXYCODONE HCL 5 MG PO TABS: 5.0000 mg | ORAL_TABLET | Freq: Four times a day (QID) | ORAL | 0 refills | Status: DC | PRN

## 2016-05-15 MED ORDER — ENOXAPARIN SODIUM 80 MG/0.8ML ~~LOC~~ SOLN
80.0000 mg | Freq: Two times a day (BID) | SUBCUTANEOUS | 1 refills | Status: DC
Start: 2016-05-15 — End: 2017-05-15

## 2016-05-15 NOTE — Progress Notes (Signed)
lovenox Pt is scheduled to have surgery on 10/18 to remove the rest of the cancer.  We refilled his lovenox, oxycodone, and tramadol.  Wanted something for anxiety. Xanax and lexapro given

## 2016-05-31 ENCOUNTER — Encounter (HOSPITAL_COMMUNITY): Payer: BLUE CROSS/BLUE SHIELD | Attending: Hematology & Oncology | Admitting: Oncology

## 2016-05-31 ENCOUNTER — Encounter (HOSPITAL_COMMUNITY): Payer: Self-pay | Admitting: Oncology

## 2016-05-31 VITALS — BP 131/80 | HR 102 | Temp 98.5°F | Resp 16 | Ht 69.0 in | Wt 176.0 lb

## 2016-05-31 DIAGNOSIS — C20 Malignant neoplasm of rectum: Secondary | ICD-10-CM | POA: Insufficient documentation

## 2016-05-31 DIAGNOSIS — R58 Hemorrhage, not elsewhere classified: Secondary | ICD-10-CM | POA: Diagnosis not present

## 2016-05-31 LAB — COMPREHENSIVE METABOLIC PANEL
ALK PHOS: 69 U/L (ref 38–126)
ALT: 41 U/L (ref 17–63)
AST: 32 U/L (ref 15–41)
Albumin: 4.4 g/dL (ref 3.5–5.0)
Anion gap: 8 (ref 5–15)
BILIRUBIN TOTAL: 0.6 mg/dL (ref 0.3–1.2)
BUN: 14 mg/dL (ref 6–20)
CALCIUM: 9.2 mg/dL (ref 8.9–10.3)
CO2: 25 mmol/L (ref 22–32)
CREATININE: 0.9 mg/dL (ref 0.61–1.24)
Chloride: 104 mmol/L (ref 101–111)
Glucose, Bld: 79 mg/dL (ref 65–99)
Potassium: 3.9 mmol/L (ref 3.5–5.1)
Sodium: 137 mmol/L (ref 135–145)
TOTAL PROTEIN: 7.6 g/dL (ref 6.5–8.1)

## 2016-05-31 LAB — CBC WITH DIFFERENTIAL/PLATELET
Basophils Absolute: 0 10*3/uL (ref 0.0–0.1)
Basophils Relative: 1 %
EOS PCT: 4 %
Eosinophils Absolute: 0.2 10*3/uL (ref 0.0–0.7)
HEMATOCRIT: 42.9 % (ref 39.0–52.0)
Hemoglobin: 14.7 g/dL (ref 13.0–17.0)
LYMPHS ABS: 1.6 10*3/uL (ref 0.7–4.0)
LYMPHS PCT: 27 %
MCH: 33.6 pg (ref 26.0–34.0)
MCHC: 34.3 g/dL (ref 30.0–36.0)
MCV: 97.9 fL (ref 78.0–100.0)
Monocytes Absolute: 0.9 10*3/uL (ref 0.1–1.0)
Monocytes Relative: 15 %
NEUTROS ABS: 3.2 10*3/uL (ref 1.7–7.7)
Neutrophils Relative %: 53 %
PLATELETS: 234 10*3/uL (ref 150–400)
RBC: 4.38 MIL/uL (ref 4.22–5.81)
RDW: 13.2 % (ref 11.5–15.5)
WBC: 5.9 10*3/uL (ref 4.0–10.5)

## 2016-05-31 LAB — APTT: aPTT: 67 seconds — ABNORMAL HIGH (ref 24–36)

## 2016-05-31 LAB — PROTIME-INR
INR: 0.93
PROTHROMBIN TIME: 12.5 s (ref 11.4–15.2)

## 2016-05-31 MED ORDER — HEPARIN SOD (PORK) LOCK FLUSH 100 UNIT/ML IV SOLN
INTRAVENOUS | Status: AC
  Filled 2016-05-31: qty 5

## 2016-05-31 NOTE — Progress Notes (Signed)
Jonathan Wright, [place] [address] [place] [address]  Rectal cancer [facility]) - Plan: CBC with Differential, Comprehensive metabolic panel, Protime-INR, APTT, CBC with Differential, Comprehensive metabolic panel, Protime-INR, APTT  CURRENT THERAPY: Planning for definitive surgical management.  INTERVAL HISTORY: Jonathan Wright 50 y.o. 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   Now undergoing planning for definitive surgical resection.  Oncology History   Stage IIIB rectal adenocarcinoma, mT2N2M0 Treated with 5Gy x 5 at [facility]     Rectal cancer [facility])   09/20/2015 Procedure    colonoscopy with firm rectal mass and 4 cm polyp in proximal rectum, infiltrating non-obstructing large mass within the distal rectum extending towards anal verge      09/20/2015 Miscellaneous    Microsatellite stable.       09/28/2015 Imaging    CT C/A/P no evidence of distant metastatic disease. irregular thickening of the low rectum and anal canal, several enlarged perirectal LN, indeterminate 77m RUL nodule      09/28/2015 Imaging    MRI pelvis assym diffuse circum lobular thickening of rectum, near full wall thickness extension along lower anterior rectum W/O extra serosal extension c/w known adeno, mult. perirectal LN      12/06/2015 - 12/10/2015 Radiation Therapy    [facility] Dr. AAngelina Ok  2500.00 cGy, 500.00 cGy per day in 5 fractions delivered 1x per day to the 96.0% Isodose line Treatment Dates: 12/06/2015 through 12/10/2015.       01/03/2016 Procedure    PICC placed      01/04/2016 - 03/14/2016 Chemotherapy    FOLFOX x 6 cycles      He is doing well post-treatment.  He is on Lovenox injections and his boyfriend is self-administering.  He does have a few  ecchymoses as a result.  He is given better instructions on most appropriate location for these injections.  They are currently being administered to far laterally.    He is planned to undergo a low anterior resection by Dr. BLestine Boxon 06/07/2016.  Pre-op labs and physical exam is requested pre-operatively.  We discussed surgery and I understand his past hesitation regarding surgical intervention.  He is prepared for surgery mentally and emotionally at this time, even if it results in the development of a colostomy, although this is not the goal.  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  He notes that it started with the start of his anticoagulation.  Review of Systems  Constitutional: Negative.  Negative for chills, fever and weight loss.  HENT: Negative.  Negative for congestion and sore throat.   Eyes: Negative.   Respiratory: Negative.  Negative for cough, sputum production and shortness of breath.   Cardiovascular: Negative.  Negative for chest pain.  Gastrointestinal: Negative.  Negative for abdominal pain, blood in stool, constipation, diarrhea, nausea and vomiting.  Genitourinary: Negative.  Negative for dysuria, frequency, hematuria and urgency.  Musculoskeletal: Negative.  Negative for falls.  Skin: Negative.  Negative for rash.  Neurological: Negative.  Negative for weakness.  Endo/Heme/Allergies: Negative.   Psychiatric/Behavioral: Negative.     Past Medical History:  Diagnosis Date  . Anxiety   . Depression   . [place]  History reviewed. No pertinent surgical history.  History reviewed. No pertinent family history.  Social History   Social History  . Marital status: Divorced    Spouse name: [name]  . Number of children: [name]  . Years of education: [name]   Social History Main Topics  . Smoking status: Never Smoker  . Smokeless tobacco: None  . Alcohol use Yes     Comment: occ.   . Drug  use: No  . Sexual activity: Not Asked   Other Topics Concern  . None   Social History Narrative  . None     PHYSICAL EXAMINATION  ECOG PERFORMANCE STATUS: 0 - Asymptomatic  Vitals:   05/31/16 1500  BP: 131/80  Pulse: (!) 102  Resp: 16  Temp: 98.5 F (36.9 C)    GENERAL:alert, no distress, well nourished, well developed, comfortable, cooperative, smiling and unaccompanied today SKIN: skin color, texture, turgor are normal, no rashes or significant lesions HEAD: Normocephalic, No masses, lesions, tenderness or abnormalities EYES: normal, EOMI, Conjunctiva are pink and non-injected EARS: External ears normal OROPHARYNX:lips, buccal mucosa, and tongue normal and mucous membranes are moist  NECK: supple, no adenopathy, thyroid normal size, non-tender, without nodularity, trachea midline LYMPH:  no palpable lymphadenopathy, no hepatosplenomegaly BREAST:not examined LUNGS: clear to auscultation and percussion HEART: regular rate & rhythm, no murmurs, no gallops, S1 normal and S2 normal ABDOMEN:abdomen soft, non-tender, normal bowel sounds and no masses or organomegaly BACK: Back symmetric, no curvature., No CVA tenderness EXTREMITIES:less then 2 second capillary refill, no joint deformities, effusion, or inflammation, no edema, no skin discoloration, no clubbing, no cyanosis  NEURO: alert & oriented x 3 with fluent speech, no focal motor/sensory deficits, gait normal   LABORATORY DATA: CBC    Component Value Date/Time   WBC 5.9 05/31/2016 1618   RBC 4.38 05/31/2016 1618   HGB 14.7 05/31/2016 1618   HCT 42.9 05/31/2016 1618   PLT 234 05/31/2016 1618   MCV 97.9 05/31/2016 1618   MCH 33.6 05/31/2016 1618   MCHC 34.3 05/31/2016 1618   RDW 13.2 05/31/2016 1618   LYMPHSABS 1.6 05/31/2016 1618   MONOABS 0.9 05/31/2016 1618   EOSABS 0.2 05/31/2016 1618   BASOSABS 0.0 05/31/2016 1618      Chemistry      Component Value Date/Time   NA 137 05/31/2016 1618   K 3.9  05/31/2016 1618   CL 104 05/31/2016 1618   CO2 25 05/31/2016 1618   BUN 14 05/31/2016 1618   CREATININE 0.90 05/31/2016 1618      Component Value Date/Time   CALCIUM 9.2 05/31/2016 1618   ALKPHOS 69 05/31/2016 1618   AST 32 05/31/2016 1618   ALT 41 05/31/2016 1618   BILITOT 0.6 05/31/2016 1618      Lab Results  Component Value Date   INR 0.93 05/31/2016   Results for CARSIN, RANDAZZO (MRN [id number]) as of 05/31/2016 17:32  Ref. Range 05/31/2016 16:18  Prothrombin Time Latest Ref Range: 11.4 - 15.2 seconds 12.5  INR Unknown 0.93  APTT Latest Ref Range: 24 - 36 seconds 67 (H)    PENDING LABS:   RADIOGRAPHIC STUDIES:  No results found.   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   Now undergoing planning for definitive surgical resection.  Oncology  history is updated.  He is here today for pre-op labs and pre-op physical for clearance.  He does not have a primary care provider nor a cardiologist.  Labs today: CBC diff, CMET, PT/INR, aPTT.  I personally reviewed and went over laboratory results with the patient.  The results are noted within this dictation.  Surgery is scheduled for 06/07/2016.  We will fax a copy of lab results, in addition to today's dictation to: Attn: Delanna Ahmadi at [phone].  I have asked [place] to make contact with [facility] to confirm receipt of aforementioned information.  I have recommended a follow-up appointment with in ~3-4 weeks, but he would rather call for an appointment following surgery.  He is educated that future medical oncology recommendations will be provided based upon pathology results.   ORDERS PLACED FOR THIS ENCOUNTER: Orders Placed This Encounter  Procedures  . CBC with Differential  .  Comprehensive metabolic panel  . Protime-INR  . APTT    MEDICATIONS PRESCRIBED THIS ENCOUNTER: Meds ordered this encounter  Medications  . DISCONTD: enoxaparin (LOVENOX) 80 MG/0.8ML injection    Sig: Inject into the skin.    THERAPY PLAN:  Surgery (low anterior resection) as planned on 06/07/2016 with Dr. Lestine Box at [facility].  All questions were answered. The patient knows to call the clinic with any problems, questions or concerns. We can certainly see the patient much sooner if necessary.  Patient and plan discussed with Dr. Ancil Linsey and she is in agreement with the aforementioned.   This note is electronically signed by: Doy Mince 05/31/2016 5:33 PM

## 2016-05-31 NOTE — Assessment & Plan Note (Signed)
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   Now undergoing planning for definitive surgical resection.  Oncology history is updated.  He is here today for pre-op labs and pre-op physical for clearance.  He does not have a primary care provider nor a cardiologist.  Labs today: CBC diff, CMET, PT/INR, aPTT.  I personally reviewed and went over laboratory results with the patient.  The results are noted within this dictation.  Surgery is scheduled for 06/07/2016.  We will fax a copy of lab results, in addition to today's dictation to: Attn: Delanna Ahmadi at [phone].  I have asked Izahia to make contact with [facility] to confirm receipt of aforementioned information.  I have recommended a follow-up appointment with in ~3-4 weeks, but he would rather call for an appointment following surgery.  He is educated that future medical oncology recommendations will be provided based upon pathology results.

## 2016-05-31 NOTE — Patient Instructions (Signed)
[place] at [facility] Discharge Instructions  RECOMMENDATIONS MADE BY THE CONSULTANT AND ANY TEST RESULTS WILL BE SENT TO YOUR REFERRING PHYSICIAN.  You were seen today by Kirby Crigler PA-C. Labs drawn today, will call once with results and fax a copy to [facility]. Call and make a follow up appointment after surgery.   Thank you for choosing [place] at [facility] to provide your oncology and hematology care.  To afford each patient quality time with our provider, please arrive at least 15 minutes before your scheduled appointment time.   Beginning January 23rd 2017 lab work for the [facility] will be done in the  Main lab at [facility] on 1st floor. If you have a lab appointment with the [facility] please come in thru the  Main Entrance and check in at the main information desk  You need to re-schedule your appointment should you arrive 10 or more minutes late.  We strive to give you quality time with our providers, and arriving late affects you and other patients whose appointments are after yours.  Also, if you no show three or more times for appointments you may be dismissed from the clinic at the providers discretion.     Again, thank you for choosing [facility].  Our hope is that these requests will decrease the amount of time that you wait before being seen by our physicians.       _____________________________________________________________  Should you have questions after your visit to [facility], please contact our office at [phone] between the hours of 8:30 a.m. and 4:30 p.m.  Voicemails left after 4:30 p.m. will not be returned until the following business day.  For prescription refill requests, have your pharmacy contact our office.         Resources For Cancer Patients and their Caregivers ? American Cancer Society: Can assist with transportation, wigs, general needs, runs  Look Good Feel Better.        5193149594 ? Cancer Care: Provides financial assistance, online support groups, medication/co-pay assistance.  1-800-813-HOPE 513-154-1570) ? [place] Assists [place] cancer patients and their families through emotional , educational and financial support.  838-419-6211 ? [place] DSS Where to apply for food stamps, Medicaid and utility assistance. (832) 366-2336 ? [facility]: Transportation to medical appointments. (854)816-7670 ? Social Security Administration: May apply for disability if have a Stage IV cancer. 5161907074 682-386-4868 ? [employer], Disability and Transit Services: Assists with nutrition, care and transit needs. [place] Support Programs: @10RELATIVEDAYS @ > Cancer Support Group  2nd Tuesday of the month 1pm-2pm, Journey Room  > Creative Journey  3rd Tuesday of the month 1130am-1pm, Journey Room  > Look Good Feel Better  1st Wednesday of the month 10am-12 noon, Journey Room (Call [place] to register [phone])

## 2016-06-07 HISTORY — PX: LAPAROSCOPIC LOW ANTERIOR RESCECTION WITH COLOANAL ANASTOMOSIS: SHX5903

## 2016-06-07 HISTORY — PX: DIVERTING ILEOSTOMY: SHX5799

## 2016-06-07 HISTORY — PX: ABDOMINAL PERINEAL BOWEL RESECTION: SHX1111

## 2016-06-19 ENCOUNTER — Inpatient Hospital Stay (HOSPITAL_COMMUNITY)
Admission: EM | Admit: 2016-06-19 | Discharge: 2016-06-23 | DRG: 862 | Disposition: A | Discharge status: Home / Self Care | Payer: BLUE CROSS/BLUE SHIELD | Attending: Internal Medicine | Admitting: Internal Medicine

## 2016-06-19 ENCOUNTER — Encounter (HOSPITAL_COMMUNITY): Payer: Self-pay | Admitting: Emergency Medicine

## 2016-06-19 ENCOUNTER — Emergency Department (HOSPITAL_COMMUNITY): Payer: BLUE CROSS/BLUE SHIELD

## 2016-06-19 ENCOUNTER — Telehealth (HOSPITAL_COMMUNITY): Payer: Self-pay | Admitting: *Deleted

## 2016-06-19 ENCOUNTER — Telehealth (HOSPITAL_COMMUNITY): Payer: Self-pay

## 2016-06-19 DIAGNOSIS — A419 Sepsis, unspecified organism: Secondary | ICD-10-CM

## 2016-06-19 DIAGNOSIS — Z95828 Presence of other vascular implants and grafts: Secondary | ICD-10-CM

## 2016-06-19 DIAGNOSIS — T814XXA Infection following a procedure, initial encounter: Secondary | ICD-10-CM | POA: Diagnosis present

## 2016-06-19 DIAGNOSIS — A4150 Gram-negative sepsis, unspecified: Secondary | ICD-10-CM | POA: Diagnosis present

## 2016-06-19 DIAGNOSIS — C20 Malignant neoplasm of rectum: Secondary | ICD-10-CM | POA: Diagnosis present

## 2016-06-19 DIAGNOSIS — Z86711 Personal history of pulmonary embolism: Secondary | ICD-10-CM

## 2016-06-19 DIAGNOSIS — Y838 Other surgical procedures as the cause of abnormal reaction of the patient, or of later complication, without mention of misadventure at the time of the procedure: Secondary | ICD-10-CM | POA: Diagnosis present

## 2016-06-19 DIAGNOSIS — K625 Hemorrhage of anus and rectum: Secondary | ICD-10-CM | POA: Diagnosis present

## 2016-06-19 DIAGNOSIS — Z932 Ileostomy status: Secondary | ICD-10-CM | POA: Diagnosis not present

## 2016-06-19 DIAGNOSIS — F418 Other specified anxiety disorders: Secondary | ICD-10-CM | POA: Diagnosis present

## 2016-06-19 DIAGNOSIS — D62 Acute posthemorrhagic anemia: Secondary | ICD-10-CM | POA: Diagnosis present

## 2016-06-19 DIAGNOSIS — N390 Urinary tract infection, site not specified: Secondary | ICD-10-CM | POA: Diagnosis present

## 2016-06-19 DIAGNOSIS — K611 Rectal abscess: Secondary | ICD-10-CM | POA: Diagnosis present

## 2016-06-19 DIAGNOSIS — E860 Dehydration: Secondary | ICD-10-CM | POA: Diagnosis present

## 2016-06-19 DIAGNOSIS — R509 Fever, unspecified: Secondary | ICD-10-CM | POA: Diagnosis not present

## 2016-06-19 DIAGNOSIS — D649 Anemia, unspecified: Secondary | ICD-10-CM

## 2016-06-19 DIAGNOSIS — Z880 Allergy status to penicillin: Secondary | ICD-10-CM | POA: Diagnosis not present

## 2016-06-19 DIAGNOSIS — Z9221 Personal history of antineoplastic chemotherapy: Secondary | ICD-10-CM

## 2016-06-19 DIAGNOSIS — N3001 Acute cystitis with hematuria: Secondary | ICD-10-CM

## 2016-06-19 DIAGNOSIS — Z923 Personal history of irradiation: Secondary | ICD-10-CM

## 2016-06-19 DIAGNOSIS — Z79899 Other long term (current) drug therapy: Secondary | ICD-10-CM

## 2016-06-19 DIAGNOSIS — E871 Hypo-osmolality and hyponatremia: Secondary | ICD-10-CM | POA: Diagnosis not present

## 2016-06-19 HISTORY — DX: Anemia, unspecified: D64.9

## 2016-06-19 HISTORY — DX: Sepsis, unspecified organism: A41.9

## 2016-06-19 HISTORY — DX: Rectal abscess: K61.1

## 2016-06-19 LAB — CBC WITH DIFFERENTIAL/PLATELET
Basophils Absolute: 0.1 10*3/uL (ref 0.0–0.1)
Basophils Relative: 0 %
EOS ABS: 0.1 10*3/uL (ref 0.0–0.7)
Eosinophils Relative: 1 %
HEMATOCRIT: 28.7 % — AB (ref 39.0–52.0)
HEMOGLOBIN: 9.4 g/dL — AB (ref 13.0–17.0)
LYMPHS ABS: 0.8 10*3/uL (ref 0.7–4.0)
Lymphocytes Relative: 5 %
MCH: 31.9 pg (ref 26.0–34.0)
MCHC: 32.8 g/dL (ref 30.0–36.0)
MCV: 97.3 fL (ref 78.0–100.0)
Monocytes Absolute: 1.9 10*3/uL — ABNORMAL HIGH (ref 0.1–1.0)
Monocytes Relative: 11 %
NEUTROS ABS: 14.6 10*3/uL — AB (ref 1.7–7.7)
NEUTROS PCT: 83 %
Platelets: 571 10*3/uL — ABNORMAL HIGH (ref 150–400)
RBC: 2.95 MIL/uL — AB (ref 4.22–5.81)
RDW: 13.6 % (ref 11.5–15.5)
WBC: 17.5 10*3/uL — AB (ref 4.0–10.5)

## 2016-06-19 LAB — URINALYSIS, ROUTINE W REFLEX MICROSCOPIC
BILIRUBIN URINE: NEGATIVE
GLUCOSE, UA: NEGATIVE mg/dL
KETONES UR: NEGATIVE mg/dL
Nitrite: NEGATIVE
Specific Gravity, Urine: 1.02 (ref 1.005–1.030)
pH: 5.5 (ref 5.0–8.0)

## 2016-06-19 LAB — COMPREHENSIVE METABOLIC PANEL
ALBUMIN: 2.9 g/dL — AB (ref 3.5–5.0)
ALT: 46 U/L (ref 17–63)
AST: 21 U/L (ref 15–41)
Alkaline Phosphatase: 154 U/L — ABNORMAL HIGH (ref 38–126)
Anion gap: 8 (ref 5–15)
BUN: 8 mg/dL (ref 6–20)
CHLORIDE: 100 mmol/L — AB (ref 101–111)
CO2: 25 mmol/L (ref 22–32)
Calcium: 8.4 mg/dL — ABNORMAL LOW (ref 8.9–10.3)
Creatinine, Ser: 1.01 mg/dL (ref 0.61–1.24)
GFR calc Af Amer: >60 mL/min (ref >60)
GLUCOSE: 102 mg/dL — AB (ref 65–99)
POTASSIUM: 3.8 mmol/L (ref 3.5–5.1)
SODIUM: 133 mmol/L — AB (ref 135–145)
Total Bilirubin: 0.5 mg/dL (ref 0.3–1.2)
Total Protein: 7 g/dL (ref 6.5–8.1)

## 2016-06-19 LAB — URINE MICROSCOPIC-ADD ON

## 2016-06-19 LAB — TYPE AND SCREEN
ABO/RH(D): A POS
ANTIBODY SCREEN: NEGATIVE

## 2016-06-19 LAB — LIPASE, BLOOD: LIPASE: 14 U/L (ref 11–51)

## 2016-06-19 LAB — PROTIME-INR
INR: 1.12
PROTHROMBIN TIME: 14.5 s (ref 11.4–15.2)

## 2016-06-19 LAB — PROCALCITONIN: PROCALCITONIN: 0.25 ng/mL

## 2016-06-19 LAB — LACTIC ACID, PLASMA
Lactic Acid, Venous: 0.7 mmol/L (ref 0.5–1.9)
Lactic Acid, Venous: 0.8 mmol/L (ref 0.5–1.9)

## 2016-06-19 LAB — APTT: aPTT: 37 seconds — ABNORMAL HIGH (ref 24–36)

## 2016-06-19 MED ORDER — ONDANSETRON HCL 4 MG/2ML IJ SOLN: 4.0000 mg | INTRAMUSCULAR | Status: DC | PRN

## 2016-06-19 MED ORDER — IBUPROFEN 600 MG PO TABS
600.0000 mg | ORAL_TABLET | Freq: Four times a day (QID) | ORAL | Status: DC | PRN
  Administered 2016-06-21 – 2016-06-22 (×5): 600 mg via ORAL
  Filled 2016-06-19 (×5): qty 1

## 2016-06-19 MED ORDER — ACETAMINOPHEN 500 MG PO TABS
1000.0000 mg | ORAL_TABLET | Freq: Once | ORAL | Status: AC
  Administered 2016-06-19: 1000 mg via ORAL
  Filled 2016-06-19: qty 2

## 2016-06-19 MED ORDER — MEROPENEM 1 G IV SOLR
1.0000 g | Freq: Three times a day (TID) | INTRAVENOUS | Status: DC
  Administered 2016-06-19 – 2016-06-23 (×11): 1 g via INTRAVENOUS
  Filled 2016-06-19 (×21): qty 1

## 2016-06-19 MED ORDER — SODIUM CHLORIDE 0.9 % IV BOLUS (SEPSIS): 1000.0000 mL | Freq: Once | INTRAVENOUS | Status: DC

## 2016-06-19 MED ORDER — ACETAMINOPHEN 325 MG PO TABS
650.0000 mg | ORAL_TABLET | Freq: Four times a day (QID) | ORAL | Status: DC | PRN
  Administered 2016-06-20 – 2016-06-23 (×2): 650 mg via ORAL
  Filled 2016-06-19 (×3): qty 2

## 2016-06-19 MED ORDER — SODIUM CHLORIDE 0.9% FLUSH
3.0000 mL | Freq: Two times a day (BID) | INTRAVENOUS | Status: DC
  Administered 2016-06-19 – 2016-06-22 (×6): 3 mL via INTRAVENOUS

## 2016-06-19 MED ORDER — ESCITALOPRAM OXALATE 10 MG PO TABS
10.0000 mg | ORAL_TABLET | Freq: Every day | ORAL | Status: DC
Start: 2016-06-20 — End: 2016-06-23
  Administered 2016-06-20 – 2016-06-23 (×4): 10 mg via ORAL
  Filled 2016-06-19 (×4): qty 1

## 2016-06-19 MED ORDER — ONDANSETRON HCL 4 MG PO TABS: 4.0000 mg | ORAL_TABLET | Freq: Four times a day (QID) | ORAL | Status: DC | PRN

## 2016-06-19 MED ORDER — VANCOMYCIN HCL 10 G IV SOLR: 1500.0000 mg | Freq: Once | INTRAVENOUS | Status: DC

## 2016-06-19 MED ORDER — IOPAMIDOL (ISOVUE-300) INJECTION 61%
INTRAVENOUS | Status: AC
  Filled 2016-06-19: qty 30

## 2016-06-19 MED ORDER — VANCOMYCIN HCL IN DEXTROSE 1-5 GM/200ML-% IV SOLN
1000.0000 mg | Freq: Two times a day (BID) | INTRAVENOUS | Status: DC
Start: 2016-06-20 — End: 2016-06-22
  Administered 2016-06-20 – 2016-06-21 (×4): 1000 mg via INTRAVENOUS
  Filled 2016-06-19 (×4): qty 200

## 2016-06-19 MED ORDER — ACETAMINOPHEN 650 MG RE SUPP: 650.0000 mg | Freq: Four times a day (QID) | RECTAL | Status: DC | PRN

## 2016-06-19 MED ORDER — VANCOMYCIN HCL 10 G IV SOLR
1500.0000 mg | Freq: Once | INTRAVENOUS | Status: DC
  Administered 2016-06-19: 1500 mg via INTRAVENOUS
  Filled 2016-06-19: qty 1500

## 2016-06-19 MED ORDER — SODIUM CHLORIDE 0.9 % IV SOLN
INTRAVENOUS | Status: DC
  Administered 2016-06-19: 17:00:00 via INTRAVENOUS

## 2016-06-19 MED ORDER — SODIUM CHLORIDE 0.9 % IV SOLN
INTRAVENOUS | Status: AC
  Administered 2016-06-20 (×2): via INTRAVENOUS

## 2016-06-19 MED ORDER — MORPHINE SULFATE (PF) 4 MG/ML IV SOLN
4.0000 mg | INTRAVENOUS | Status: DC | PRN
  Administered 2016-06-19: 4 mg via INTRAVENOUS
  Filled 2016-06-19: qty 1

## 2016-06-19 MED ORDER — MEROPENEM 1 G IV SOLR
INTRAVENOUS | Status: AC
  Filled 2016-06-19: qty 2

## 2016-06-19 MED ORDER — HYDROMORPHONE HCL 1 MG/ML IJ SOLN
0.5000 mg | INTRAMUSCULAR | Status: DC | PRN
  Administered 2016-06-20 (×4): 0.5 mg via INTRAVENOUS
  Administered 2016-06-20 – 2016-06-22 (×4): 1 mg via INTRAVENOUS
  Filled 2016-06-19: qty 0.5
  Filled 2016-06-19 (×2): qty 1
  Filled 2016-06-19 (×3): qty 0.5
  Filled 2016-06-19 (×2): qty 1

## 2016-06-19 MED ORDER — ONDANSETRON HCL 4 MG/2ML IJ SOLN: 4.0000 mg | Freq: Four times a day (QID) | INTRAMUSCULAR | Status: DC | PRN

## 2016-06-19 MED ORDER — IOPAMIDOL (ISOVUE-300) INJECTION 61%
100.0000 mL | Freq: Once | INTRAVENOUS | Status: AC | PRN
  Administered 2016-06-19: 100 mL via INTRAVENOUS

## 2016-06-19 NOTE — ED Provider Notes (Signed)
[facility] DEPT Provider Note   CSN: [id number] Arrival date & time: 06/19/16  1620     History   Chief Complaint Chief Complaint  Patient presents with  . Fever    HPI Jonathan Wright is a 50 y.o. male.  HPI  Pt was seen at 1650. Per pt, c/o gradual onset and persistence of intermittent home fevers to "102" for the past 5 days. Has been associated with increased abd "pain" as well as rectal bleeding. Pt states he had "colon surgery for rectal cancer" on 06/07/16, and had been feeling well until last week. LD ibuprofen approximately 1200 today. Denies N/V, no change in stools, no black or bloody stools, no dysuria/hematuria, no back pain, no neck pain, no rash, no CP/palpitations, no SOB/cough, no calf/LE pain or unilateral swelling.    Past Medical History:  Diagnosis Date  . Anxiety   . Depression   . Rectal cancer ([place]) 12/17/2015    Patient Active Problem List   Diagnosis Date Noted  . S/P PICC central line placement 01/13/2016  . Rectal cancer ([place]) 12/17/2015  . Internal hemorrhoids 12/10/2013    Past Surgical History:  Procedure Laterality Date  . ABDOMINAL PERINEAL BOWEL RESECTION  06/07/2016   [facility]  . COLON SURGERY    . DIVERTING ILEOSTOMY  06/07/2016   [facility]  . LAPAROSCOPIC LOW ANTERIOR RESCECTION WITH COLOANAL ANASTOMOSIS  06/07/2016   [facility]  . PERIPHERALLY INSERTED CENTRAL CATHETER INSERTION  11/2015       Home Medications    Prior to Admission medications   Medication Sig Start Date End Date Taking? Authorizing Provider  ALPRAZolam Duanne Moron) 0.5 MG tablet Take 1 tablet (0.5 mg total) by mouth 2 (two) times daily as needed for anxiety. 05/15/16   Patrici Ranks, MD  clonazePAM (KLONOPIN) 0.5 MG tablet Take 1 tablet (0.5 mg total) by mouth at bedtime. May take 2 tablet if needed 01/13/16   Patrici Ranks, MD  dextrose 5 % SOLN 1,000 mL with fluorouracil 5 GM/100ML SOLN Inject into the vein. To infuse over 46 hours every  14 days. To start May 8    Historical Provider, MD  enoxaparin (LOVENOX) 80 MG/0.8ML injection Inject 0.8 mLs (80 mg total) into the skin every 12 (twelve) hours. 05/15/16   Patrici Ranks, MD  escitalopram (LEXAPRO) 10 MG tablet Take 1 tablet (10 mg total) by mouth daily. 05/15/16   Patrici Ranks, MD  Heparin Lock Flush (HEPARIN FLUSH, PORCINE,) 100 UNIT/ML injection Flush picc line with 2.72ml (250 units) of heparin lock flush on MWF weekly 01/13/16   Manon Hilding Kefalas, PA-C  ondansetron (ZOFRAN) 8 MG tablet Take 1 tablet (8 mg total) by mouth every 8 (eight) hours as needed for nausea or vomiting. Patient not taking: Reported on 05/31/2016 01/04/16   Baird Cancer, PA-C  OXALIPLATIN IV Inject into the vein. Every 14 days x 6 cycles. Starting May 8.    Historical Provider, MD  oxyCODONE (OXY IR/ROXICODONE) 5 MG immediate release tablet Take 1 tablet (5 mg total) by mouth every 6 (six) hours as needed for severe pain (take 1-2 tabs po every 6 hrs as needed). Patient not taking: Reported on 05/31/2016 05/15/16   Patrici Ranks, MD  prochlorperazine (COMPAZINE) 10 MG tablet Take 1 tablet (10 mg total) by mouth every 6 (six) hours as needed for nausea or vomiting. Patient not taking: Reported on 05/31/2016 02/29/16   Patrici Ranks, MD  Sodium Chloride Flush (NORMAL  SALINE FLUSH) 0.9 % SOLN Flush picc line with 10 ml of normal saline on MWF weekly. 01/13/16   Baird Cancer, PA-C  traMADol (ULTRAM) 50 MG tablet Take 1 tablet (50 mg total) by mouth every 6 (six) hours as needed. Reported on 01/04/2016 Patient not taking: Reported on 05/31/2016 05/15/16   Patrici Ranks, MD    Family History History reviewed. No pertinent family history.  Social History Social History  Substance Use Topics  . Smoking status: Never Smoker  . Smokeless tobacco: Never Used  . Alcohol use Yes     Comment: occ.      Allergies   Penicillins and Penicillin g   Review of Systems Review of  Systems ROS: Statement: All systems negative except as marked or noted in the HPI; Constitutional: +fever and chills. ; ; Eyes: Negative for eye pain, redness and discharge. ; ; ENMT: Negative for ear pain, hoarseness, nasal congestion, sinus pressure and sore throat. ; ; Cardiovascular: Negative for chest pain, palpitations, diaphoresis, dyspnea and peripheral edema. ; ; Respiratory: Negative for cough, wheezing and stridor. ; ; Gastrointestinal: Negative for nausea, vomiting, diarrhea, blood in stool, hematemesis, jaundice. +abd pain, rectal bleeding. . ; ; Genitourinary: Negative for dysuria, flank pain and hematuria. ; ; Musculoskeletal: Negative for back pain and neck pain. Negative for swelling and trauma.; ; Skin: Negative for pruritus, rash, abrasions, blisters, bruising and skin lesion.; ; Neuro: Negative for headache, lightheadedness and neck stiffness. Negative for weakness, altered level of consciousness, altered mental status, extremity weakness, paresthesias, involuntary movement, seizure and syncope.       Physical Exam Updated Vital Signs BP 121/86 (BP Location: Left Arm)   Pulse 112   Temp 99.8 F (37.7 C) (Oral)   Resp 16   Ht 5\' 9"  (1.753 m)   Wt 170 lb (77.1 kg)   SpO2 100%   BMI 25.10 kg/m    Patient Vitals for the past 24 hrs:  BP Temp Temp src Pulse Resp SpO2 Height Weight  06/19/16 1955 106/63 - - 120 17 95 % - -  06/19/16 1949 - 102.6 F (39.2 C) Oral - - - - -  06/19/16 1809 105/67 99.3 F (37.4 C) - 88 16 98 % - -  06/19/16 1630 121/86 99.8 F (37.7 C) Oral 112 16 100 % 5\' 9"  (1.753 m) 170 lb (77.1 kg)     Physical Exam 1655: Physical examination:  Nursing notes reviewed; Vital signs and O2 SAT reviewed;  Constitutional: Well developed, Well nourished, Well hydrated, In no acute distress; Head:  Normocephalic, atraumatic; Eyes: EOMI, PERRL, No scleral icterus; ENMT: Mouth and pharynx normal, Mucous membranes moist; Neck: Supple, Full range of motion, No  lymphadenopathy; Cardiovascular: Tachycardic rate and rhythm, No gallop; Respiratory: Breath sounds clear & equal bilaterally, No wheezes.  Speaking full sentences with ease, Normal respiratory effort/excursion; Chest: Nontender, Movement normal; Abdomen: Soft, Nontender, Nondistended, Normal bowel sounds. +ileostomy right abd.; Genitourinary: No CVA tenderness; Extremities: Pulses normal, No tenderness, No edema, No calf edema or asymmetry.; Neuro: AA&Ox3, Major CN grossly intact.  Speech clear. No gross focal motor or sensory deficits in extremities.; Skin: Color normal, Warm, Dry.   ED Treatments / Results  Labs (all labs ordered are listed, but only abnormal results are displayed)   EKG  EKG Interpretation None       Radiology   Procedures Procedures (including critical care time)  Medications Ordered in ED Medications  0.9 %  sodium chloride infusion (not administered)  morphine 4 MG/ML injection 4 mg (not administered)  ondansetron (ZOFRAN) injection 4 mg (not administered)  iopamidol (ISOVUE-300) 61 % injection (not administered)     Initial Impression / Assessment and Plan / ED Course  I have reviewed the triage vital signs and the nursing notes.  Pertinent labs & imaging results that were available during my care of the patient were reviewed by me and considered in my medical decision making (see chart for details).  MDM Reviewed: previous chart, nursing note and vitals Reviewed previous: labs Interpretation: labs, x-ray and CT scan    Results for orders placed or performed during the hospital encounter of 06/19/16  Urinalysis, Routine w reflex microscopic  Result Value Ref Range   Color, Urine YELLOW YELLOW   APPearance CLEAR CLEAR   Specific Gravity, Urine 1.020 1.005 - 1.030   pH 5.5 5.0 - 8.0   Glucose, UA NEGATIVE NEGATIVE mg/dL   Hgb urine dipstick LARGE (A) NEGATIVE   Bilirubin Urine NEGATIVE NEGATIVE   Ketones, ur NEGATIVE NEGATIVE mg/dL    Protein, ur TRACE (A) NEGATIVE mg/dL   Nitrite NEGATIVE NEGATIVE   Leukocytes, UA SMALL (A) NEGATIVE  Comprehensive metabolic panel  Result Value Ref Range   Sodium 133 (L) 135 - 145 mmol/L   Potassium 3.8 3.5 - 5.1 mmol/L   Chloride 100 (L) 101 - 111 mmol/L   CO2 25 22 - 32 mmol/L   Glucose, Bld 102 (H) 65 - 99 mg/dL   BUN 8 6 - 20 mg/dL   Creatinine, Ser 1.01 0.61 - 1.24 mg/dL   Calcium 8.4 (L) 8.9 - 10.3 mg/dL   Total Protein 7.0 6.5 - 8.1 g/dL   Albumin 2.9 (L) 3.5 - 5.0 g/dL   AST 21 15 - 41 U/L   ALT 46 17 - 63 U/L   Alkaline Phosphatase 154 (H) 38 - 126 U/L   Total Bilirubin 0.5 0.3 - 1.2 mg/dL   GFR calc non Af Amer >60 >60 mL/min   GFR calc Af Amer >60 >60 mL/min   Anion gap 8 5 - 15  Lipase, blood  Result Value Ref Range   Lipase 14 11 - 51 U/L  Lactic acid, plasma  Result Value Ref Range   Lactic Acid, Venous 0.7 0.5 - 1.9 mmol/L  CBC with Differential  Result Value Ref Range   WBC 17.5 (H) 4.0 - 10.5 K/uL   RBC 2.95 (L) 4.22 - 5.81 MIL/uL   Hemoglobin 9.4 (L) 13.0 - 17.0 g/dL   HCT 28.7 (L) 39.0 - 52.0 %   MCV 97.3 78.0 - 100.0 fL   MCH 31.9 26.0 - 34.0 pg   MCHC 32.8 30.0 - 36.0 g/dL   RDW 13.6 11.5 - 15.5 %   Platelets 571 (H) 150 - 400 K/uL   Neutrophils Relative % 83 %   Neutro Abs 14.6 (H) 1.7 - 7.7 K/uL   Lymphocytes Relative 5 %   Lymphs Abs 0.8 0.7 - 4.0 K/uL   Monocytes Relative 11 %   Monocytes Absolute 1.9 (H) 0.1 - 1.0 K/uL   Eosinophils Relative 1 %   Eosinophils Absolute 0.1 0.0 - 0.7 K/uL   Basophils Relative 0 %   Basophils Absolute 0.1 0.0 - 0.1 K/uL  Urine microscopic-add on  Result Value Ref Range   Squamous Epithelial / LPF 0-5 (A) NONE SEEN   WBC, UA TOO NUMEROUS TO COUNT 0 - 5 WBC/hpf   RBC / HPF 0-5 0 - 5 RBC/hpf  Bacteria, UA FEW (A) NONE SEEN   Dg Chest 2 View Result Date: 06/19/2016 CLINICAL DATA:  Fever since Wednesday. Rectal bleeding. Rectal cancer. Recent surgery. EXAM: CHEST  2 VIEW COMPARISON:  None. FINDINGS:  Right-sided PICC line terminates at the low SVC. Midline trachea. Normal heart size and mediastinal contours. No pleural effusion or pneumothorax. Subsegmental atelectasis involves the left lung base laterally. IMPRESSION: No acute process or explanation for fever. Electronically Signed   By: Abigail Miyamoto M.D.   On: 06/19/2016 18:06   Ct Abdomen Pelvis W Contrast Result Date: 06/19/2016 CLINICAL DATA:  Rectal bleeding today. Diagnosis of rectal cancer April 2017. Colectomy June 07 2016 at [facility], fever for the past 6 days. EXAM: CT ABDOMEN AND PELVIS WITH CONTRAST TECHNIQUE: Multidetector CT imaging of the abdomen and pelvis was performed using the standard protocol following bolus administration of intravenous contrast. CONTRAST:  167mL ISOVUE-300 IOPAMIDOL (ISOVUE-300) INJECTION 61% COMPARISON:  None. FINDINGS: Lower chest: Mild scarring or subsegmental atelectasis in both lower lobes. Hepatobiliary: Unremarkable Pancreas: Unremarkable Spleen: Unremarkable Adrenals/Urinary Tract: Posterior wall thickening in the urinary bladder but otherwise normal appearance along the urothelium and of the kidneys. No hydronephrosis. Adrenal glands normal. Stomach/Bowel: Right lower quadrant ileostomy. Are port the patient had a proper colectomy, but on today's exam there seems to be a decompressed colon following its expected course, with components showing apparent Haas tray shin for example in the transverse colon on image 19/4. There is likely been removal of the rectum, with a potential pole down of the sigmoid colon into a slightly protruding region of the perineum. There is fluid, gas, and some high density in the perirectal space through which this loop of colon extends, with the collection of gas and fluid measuring approximately 340 cc when I subtract out the volume of the bowel and surrounding mesentery coursing through this collection. Scattered locules of gas in the collection are shown on images 68  through 84/2. At 12 days postoperative these gas collections are abnormal and probably reflect early abscess formation. There is also some enhancement along the margins of this presacral/ perirectal collection. There is only some minimal stranding in the ischiorectal fossa bilaterally. No dilated bowel identified. Vascular/Lymphatic: Retroaortic left renal vein, no significant atherosclerotic calcification identified. No pathologic adenopathy noted. Reproductive: There is some indistinctness of tissue planes along the seminal vesicles and posterolateral prostate gland, probably postoperative. A component could be due to radiation therapy. Other: Laparoscopy ports noted. Small amount of gas in the subcutaneous tissues the left anterior abdominal wall, image 44/2. Musculoskeletal: Loss of disc height at L5-S1 with vacuum disc phenomenon. In conjunction with disc bulges and facet arthropathy there is mild bilateral foraminal impingement at the L5-S1 level. IMPRESSION: 1. By report the patient has had abdominoperineal proctocolectomy and diverting ileostomy. I do see the ileostomy, but the patient still appears to have most of the colon. The rectum is thought to have been removed and the sigmoid colon pulled through towards the anus. Please correlate with the original operative note to explain this appearance. In any case, there is a perirectal collection of complex fluid and scattered gas density with some enhancement along the margins, volume estimated at 340 cc. Twelve days postoperative is a little bit too late to still have gas in a postoperative collection, appearance suspicious for abscess of the perirectal space. 2. No complicating feature of the ileostomy is identified. 3. There is some posterior wall thickening in the urinary bladder along with indistinctness of tissue planes  along the seminal vesicles and prostate gland. Much of this could be postoperative although radiation therapy can also cause thickening  of the structures. 4. Mild impingement at L5-S1 due to spondylosis and degenerative disc disease. Electronically Signed   By: Van Clines M.D.   On: 06/19/2016 19:31    1954:  BC and UC obtained. APAP for fever. IV morphine for pain. IV abx started after consultation with PharmD to cover for possible infectious sources: urine, intra-abd, and PICC.  Dx and testing d/w pt and family.  Questions answered.  Verb understanding, agreeable to admit.  T/C to General Surgery Dr. Arnoldo Morale, case discussed, including:  HPI, pertinent PM/SHx, VS/PE, dx testing, ED course and treatment:  Agrees with IV abx, BC/US, will consult, requests to admit to medicine service.  T/C to [facility] Dr. Myna Hidalgo, case discussed, including:  HPI, pertinent PM/SHx, VS/PE, dx testing, ED course and treatment:  Agreeable to admit, requests to write temporary orders, obtain tele bed to team APAdmits.     Final Clinical Impressions(s) / ED Diagnoses   Final diagnoses:  None    New Prescriptions New Prescriptions   No medications on file     Francine Graven, DO 06/21/16 2020

## 2016-06-19 NOTE — H&P (Signed)
History and Physical    Jonathan Wright [id number] DOB: 01-30-1966 DOA: 06/19/2016  PCP: [name], MD   Patient coming from: Home  Chief Complaint: Fevers, dysuria, lower abd pain, rectal bleeding  HPI: Jonathan Wright is a 50 y.o. male with medical history significant for depression, anxiety, and rectal cancer status post chemotherapy, radiation, and resection on 06/07/2016, now presented to the emergency department with fevers, lower abdominal pain, rectal bleeding and dysuria. Patient had a resection for his rectal cancer performed at [facility] on 06/07/2016 and reports feeling well upon hospital discharge. He notes that he was walking 4-5 miles a day shortly after the surgery, but over the past 6 days, has felt increasingly ill with fevers to 102 F, lower abdominal pain, and dysuria. Initially, the fevers and malaise would respond to Advil or Tylenol, but symptoms have been more persistent over the last couple days and not amenable to his home treatments. Over the past couple days, he has noted bright red blood per rectum. He denies chest pain, palpitations, dyspnea, or cough. He also denies headache, change in vision or hearing, focal numbness or weakness, or loss of coordination.   ED Course: Upon arrival to the ED, patient is found to be febrile to 39.2 C, saturating well on room air, tachycardic in the 110s, and with stable blood pressure. Chest x-ray is negative for acute cardiopulmonary disease, CMP is notable for sodium of 133, alkaline phosphatase of 159, and albumin of 2.9. CBC features a leukocytosis to 17,500, thrombocytosis with platelets of 571,000, and a normocytic anemia with hemoglobin of 9.4, down from 10.6 one week prior. Lactic acid is reassuring at 0.7. Urinalysis features bacteria, positive leukocytes, hemoglobin, and TNTC WBC. CT of the abdomen and pelvis was obtained and findings are concerning for a possible perirectal abscess. Blood and urine cultures  were obtained, IV fluid was administered, and the patient was started on empiric vancomycin and meropenem. Symptomatic care was provided with Zofran, morphine, and Tylenol. Gen. surgery was consulted by the ED physician. Patient will be admitted to the telemetry unit for ongoing evaluation and management of sepsis, likely secondary to UTI and/or possible perirectal abscess.  Review of Systems:  All other systems reviewed and apart from HPI, are negative.  Past Medical History:  Diagnosis Date  . Anxiety   . Depression   . Rectal cancer ([place]) 12/17/2015    Past Surgical History:  Procedure Laterality Date  . ABDOMINAL PERINEAL BOWEL RESECTION  06/07/2016   [facility]  . COLON SURGERY    . DIVERTING ILEOSTOMY  06/07/2016   [facility]  . LAPAROSCOPIC LOW ANTERIOR RESCECTION WITH COLOANAL ANASTOMOSIS  06/07/2016   [facility]  . PERIPHERALLY INSERTED CENTRAL CATHETER INSERTION  11/2015     reports that he has never smoked. He has never used smokeless tobacco. He reports that he drinks alcohol. He reports that he does not use drugs.  Allergies  Allergen Reactions  . Penicillins Hives    Has patient had a PCN reaction causing immediate rash, facial/tongue/throat swelling, SOB or lightheadedness with hypotension: Yes Has patient had a PCN reaction causing severe rash involving mucus membranes or skin necrosis: No Has patient had a PCN reaction that required hospitalization No Has patient had a PCN reaction occurring within the last 10 years: No If all of the above answers are "NO", then may proceed with Cephalosporin use.   Marland Kitchen Penicillin G Other (See Comments)    History reviewed. No pertinent family history.  Prior to Admission medications   Medication Sig Start Date End Date Taking? Authorizing Provider  enoxaparin (LOVENOX) 80 MG/0.8ML injection Inject 0.8 mLs (80 mg total) into the skin every 12 (twelve) hours. 05/15/16  Yes Patrici Ranks, MD  escitalopram (LEXAPRO)  10 MG tablet Take 1 tablet (10 mg total) by mouth daily. 05/15/16  Yes Patrici Ranks, MD  gabapentin (NEURONTIN) 100 MG capsule Take 100 mg by mouth daily as needed. For neuropathy/pain. *Prescribed one capsule three times daily* 06/11/16  Yes Historical Provider, MD  Heparin Lock Flush (HEPARIN FLUSH, PORCINE,) 100 UNIT/ML injection Flush picc line with 2.70ml (250 units) of heparin lock flush on MWF weekly Patient taking differently: every Monday, Wednesday, and Friday. Flush picc line with 2.49ml (250 units) of heparin lock flush on MWF weekly 01/13/16  Yes Manon Hilding Kefalas, PA-C  HYDROmorphone (DILAUDID) 2 MG tablet Take 2 mg by mouth 3 (three) times daily as needed. 06/11/16  Yes Historical Provider, MD  ibuprofen (ADVIL,MOTRIN) 400 MG tablet Take 400 mg by mouth 3 (three) times daily as needed for mild pain or moderate pain.  06/11/16  Yes Historical Provider, MD  oxyCODONE (OXY IR/ROXICODONE) 5 MG immediate release tablet Take 1 tablet (5 mg total) by mouth every 6 (six) hours as needed for severe pain (take 1-2 tabs po every 6 hrs as needed). Patient taking differently: Take 5-10 mg by mouth every 6 (six) hours as needed for severe pain (take 1-2 tabs po every 6 hrs as needed).  05/15/16  Yes Patrici Ranks, MD  Sodium Chloride Flush (NORMAL SALINE FLUSH) 0.9 % SOLN Flush picc line with 10 ml of normal saline on MWF weekly. Patient taking differently: every Monday, Wednesday, and Friday. Flush picc line with 10 ml of normal saline on MWF weekly. 01/13/16  Yes Manon Hilding Kefalas, PA-C  ALPRAZolam Duanne Moron) 0.5 MG tablet Take 1 tablet (0.5 mg total) by mouth 2 (two) times daily as needed for anxiety. Patient not taking: Reported on 06/19/2016 05/15/16   Patrici Ranks, MD  clonazePAM (KLONOPIN) 0.5 MG tablet Take 1 tablet (0.5 mg total) by mouth at bedtime. May take 2 tablet if needed Patient not taking: Reported on 06/19/2016 01/13/16   Patrici Ranks, MD  dextrose 5 % SOLN 1,000 mL with  fluorouracil 5 GM/100ML SOLN Inject into the vein. To infuse over 46 hours every 14 days. To start May 8    Historical Provider, MD  OXALIPLATIN IV Inject into the vein. Every 14 days x 6 cycles. Starting May 8.    Historical Provider, MD  prochlorperazine (COMPAZINE) 10 MG tablet Take 1 tablet (10 mg total) by mouth every 6 (six) hours as needed for nausea or vomiting. Patient not taking: Reported on 05/31/2016 02/29/16   Patrici Ranks, MD  traMADol (ULTRAM) 50 MG tablet Take 1 tablet (50 mg total) by mouth every 6 (six) hours as needed. Reported on 01/04/2016 Patient not taking: Reported on 05/31/2016 05/15/16   Patrici Ranks, MD    Physical Exam: Vitals:   06/19/16 1809 06/19/16 1949 06/19/16 1955 06/19/16 2045  BP: 105/67  106/63   Pulse: 88  120   Resp: 16  17   Temp: 99.3 F (37.4 C) 102.6 F (39.2 C)  (!) 103.2 F (39.6 C)  TempSrc:  Oral  Oral  SpO2: 98%  95%   Weight:      Height:          Constitutional: No respiratory distress, diaphoretic  Eyes: PERTLA, lids and conjunctivae normal ENMT: Mucous membranes are moist. Posterior pharynx clear of any exudate or lesions.   Neck: normal, supple, no masses, no thyromegaly Respiratory: clear to auscultation bilaterally, no wheezing, no crackles. Normal respiratory effort.   Cardiovascular: Rate ~120 and regular. No extremity edema. 2+ pedal pulses. No significant JVD. Abdomen: No distension, tender in suprapubic region, no rebound pain or guarding. Bowel sounds normal.  Musculoskeletal: no clubbing / cyanosis. No joint deformity upper and lower extremities. Normal muscle tone.  Skin: no significant rashes, lesions, ulcers. Warm, dry, well-perfused. Neurologic: CN 2-12 grossly intact. Sensation intact, DTR normal. Strength 5/5 in all 4 limbs.  Psychiatric: Normal judgment and insight. Alert and oriented x 3. Normal mood and affect.     Labs on Admission: I have personally reviewed following labs and imaging  studies  CBC:  Recent Labs Lab 06/19/16 1707  WBC 17.5*  NEUTROABS 14.6*  HGB 9.4*  HCT 28.7*  MCV 97.3  PLT [id number]*   Basic Metabolic Panel:  Recent Labs Lab 06/19/16 1707  NA 133*  K 3.8  CL 100*  CO2 25  GLUCOSE 102*  BUN 8  CREATININE 1.01  CALCIUM 8.4*   GFR: Estimated Creatinine Clearance: 88.5 mL/min (by C-G formula based on SCr of 1.01 mg/dL). Liver Function Tests:  Recent Labs Lab 06/19/16 1707  AST 21  ALT 46  ALKPHOS 154*  BILITOT 0.5  PROT 7.0  ALBUMIN 2.9*    Recent Labs Lab 06/19/16 1707  LIPASE 14   No results for input(s): AMMONIA in the last 168 hours. Coagulation Profile: No results for input(s): INR, PROTIME in the last 168 hours. Cardiac Enzymes: No results for input(s): CKTOTAL, CKMB, CKMBINDEX, TROPONINI in the last 168 hours. BNP (last 3 results) No results for input(s): PROBNP in the last 8760 hours. HbA1C: No results for input(s): HGBA1C in the last 72 hours. CBG: No results for input(s): GLUCAP in the last 168 hours. Lipid Profile: No results for input(s): CHOL, HDL, LDLCALC, TRIG, CHOLHDL, LDLDIRECT in the last 72 hours. Thyroid Function Tests: No results for input(s): TSH, T4TOTAL, FREET4, T3FREE, THYROIDAB in the last 72 hours. Anemia Panel: No results for input(s): VITAMINB12, FOLATE, FERRITIN, TIBC, IRON, RETICCTPCT in the last 72 hours. Urine analysis:    Component Value Date/Time   COLORURINE YELLOW 06/19/2016 1809   APPEARANCEUR CLEAR 06/19/2016 1809   LABSPEC 1.020 06/19/2016 1809   PHURINE 5.5 06/19/2016 1809   GLUCOSEU NEGATIVE 06/19/2016 1809   HGBUR LARGE (A) 06/19/2016 1809   BILIRUBINUR NEGATIVE 06/19/2016 1809   KETONESUR NEGATIVE 06/19/2016 1809   PROTEINUR TRACE (A) 06/19/2016 1809   NITRITE NEGATIVE 06/19/2016 1809   LEUKOCYTESUR SMALL (A) 06/19/2016 1809   Sepsis Labs: @LABRCNTIP (procalcitonin:4,lacticidven:4) )No results found for this or any previous visit (from the past 240 hour(s)).    Radiological Exams on Admission: Dg Chest 2 View  Result Date: 06/19/2016 CLINICAL DATA:  Fever since Wednesday. Rectal bleeding. Rectal cancer. Recent surgery. EXAM: CHEST  2 VIEW COMPARISON:  None. FINDINGS: Right-sided PICC line terminates at the low SVC. Midline trachea. Normal heart size and mediastinal contours. No pleural effusion or pneumothorax. Subsegmental atelectasis involves the left lung base laterally. IMPRESSION: No acute process or explanation for fever. Electronically Signed   By: Abigail Miyamoto M.D.   On: 06/19/2016 18:06   Ct Abdomen Pelvis W Contrast  Result Date: 06/19/2016 CLINICAL DATA:  Rectal bleeding today. Diagnosis of rectal cancer April 2017. Colectomy June 07 2016 at [facility],  fever for the past 6 days. EXAM: CT ABDOMEN AND PELVIS WITH CONTRAST TECHNIQUE: Multidetector CT imaging of the abdomen and pelvis was performed using the standard protocol following bolus administration of intravenous contrast. CONTRAST:  16mL ISOVUE-300 IOPAMIDOL (ISOVUE-300) INJECTION 61% COMPARISON:  None. FINDINGS: Lower chest: Mild scarring or subsegmental atelectasis in both lower lobes. Hepatobiliary: Unremarkable Pancreas: Unremarkable Spleen: Unremarkable Adrenals/Urinary Tract: Posterior wall thickening in the urinary bladder but otherwise normal appearance along the urothelium and of the kidneys. No hydronephrosis. Adrenal glands normal. Stomach/Bowel: Right lower quadrant ileostomy. Are port the patient had a proper colectomy, but on today's exam there seems to be a decompressed colon following its expected course, with components showing apparent Haas tray shin for example in the transverse colon on image 19/4. There is likely been removal of the rectum, with a potential pole down of the sigmoid colon into a slightly protruding region of the perineum. There is fluid, gas, and some high density in the perirectal space through which this loop of colon extends, with the collection  of gas and fluid measuring approximately 340 cc when I subtract out the volume of the bowel and surrounding mesentery coursing through this collection. Scattered locules of gas in the collection are shown on images 68 through 84/2. At 12 days postoperative these gas collections are abnormal and probably reflect early abscess formation. There is also some enhancement along the margins of this presacral/ perirectal collection. There is only some minimal stranding in the ischiorectal fossa bilaterally. No dilated bowel identified. Vascular/Lymphatic: Retroaortic left renal vein, no significant atherosclerotic calcification identified. No pathologic adenopathy noted. Reproductive: There is some indistinctness of tissue planes along the seminal vesicles and posterolateral prostate gland, probably postoperative. A component could be due to radiation therapy. Other: Laparoscopy ports noted. Small amount of gas in the subcutaneous tissues the left anterior abdominal wall, image 44/2. Musculoskeletal: Loss of disc height at L5-S1 with vacuum disc phenomenon. In conjunction with disc bulges and facet arthropathy there is mild bilateral foraminal impingement at the L5-S1 level. IMPRESSION: 1. By report the patient has had abdominoperineal proctocolectomy and diverting ileostomy. I do see the ileostomy, but the patient still appears to have most of the colon. The rectum is thought to have been removed and the sigmoid colon pulled through towards the anus. Please correlate with the original operative note to explain this appearance. In any case, there is a perirectal collection of complex fluid and scattered gas density with some enhancement along the margins, volume estimated at 340 cc. Twelve days postoperative is a little bit too late to still have gas in a postoperative collection, appearance suspicious for abscess of the perirectal space. 2. No complicating feature of the ileostomy is identified. 3. There is some posterior  wall thickening in the urinary bladder along with indistinctness of tissue planes along the seminal vesicles and prostate gland. Much of this could be postoperative although radiation therapy can also cause thickening of the structures. 4. Mild impingement at L5-S1 due to spondylosis and degenerative disc disease. Electronically Signed   By: Van Clines M.D.   On: 06/19/2016 19:31    EKG: Not performed, will obtain as appropriate.  Assessment/Plan  1. Sepsis, secondary to UTI and/or ?perirectal abscess - Presents with fever, leukocytosis, tachycardia, UTI, and possible perirectal abscess  - Lactic acid is reassuring at 0.7, CXR is clear  - Blood cultures (including from PICC) and urine cultures are incubating  - Given a NS bolus and continued on NS infusion  -  Pt has penicillin allergy and has been started on empiric vancomycin and meropenem  - Gen surgery is consulting, evaluating for possible perirectal abscess as suggested by CT    2. Rectal cancer s/p resection  - Mass identified on colonoscopy in January 2017, confirmed to be adenocarcinoma  - Underwent radiation therapy at [facility] in April, then FOLFOX at [place] [facility] from May to July 2017, and then a resection with ileostomy at [facility] on 06/07/2016 - Surgery at [facility] has been notified of admission   3. Rectal bleeding with acute anemia  - Pt notes BRB per rectum for 2 days leading up to admission  - Hgb is 9.4 on admission, down from 10.6 on 10/21 - He is tachycardic on presentation, likely d/t the acute infection rather than acute blood loss, but is being fluid-resuscitated  - Type and screen requested  - Hold pharmacologic VTE ppx for now  - Repeat CBC in am   4. Hyponatremia  - Serum sodium 133 on admission in setting of dehydration  - Anticipate resolution with fluid-resuscitation  - Repeat chem panel in am    5. Depression, anxiety - Pt reports this to be stable; he no longer takes  benzodiazepines  - Continue Lexapro    DVT prophylaxis: SCD's  Code Status: Full  Family Communication: Significant other updated at bedside at patient's request Disposition Plan: Admit to telemetry Consults called: General Surgery Admission status: Inpatient    Vianne Bulls, MD [facility] Pager [phone]  If 7PM-7AM, please contact night-coverage [email or web address] Password [email or web address]  06/19/2016, 8:54 PM

## 2016-06-19 NOTE — Progress Notes (Signed)
Pharmacy Antibiotic Note  Jonathan Wright is a 50 y.o. male admitted on 06/19/2016 with FUO.  Pharmacy has been consulted for vancomycin and meropenem dosing.  Plan: Vancomycin 1500 mg IV X 1 then 1gm IV q12 hours Meropenem 1 gm IV q8 hours F/u renal function, cultures and clinical course  Height: 5\' 9"  (175.3 cm) Weight: 170 lb (77.1 kg) IBW/kg (Calculated) : 70.7  Temp (24hrs), Avg:100.6 F (38.1 C), Min:99.3 F (37.4 C), Max:102.6 F (39.2 C)   Recent Labs Lab 06/19/16 1707  WBC 17.5*  CREATININE 1.01  LATICACIDVEN 0.7    Estimated Creatinine Clearance: 88.5 mL/min (by C-G formula based on SCr of 1.01 mg/dL).    Allergies  Allergen Reactions  . Penicillins Hives    Has patient had a PCN reaction causing immediate rash, facial/tongue/throat swelling, SOB or lightheadedness with hypotension: Yes Has patient had a PCN reaction causing severe rash involving mucus membranes or skin necrosis: No Has patient had a PCN reaction that required hospitalization No Has patient had a PCN reaction occurring within the last 10 years: No If all of the above answers are "NO", then may proceed with Cephalosporin use.   Marland Kitchen Penicillin G Other (See Comments)    Antimicrobials this admission: vanc 10/30 >>  meroepnem 10/30 >>    Thank you for allowing pharmacy to be a part of this patient's care.  Melanye Hiraldo, [place] 06/19/2016 8:05 PM

## 2016-06-19 NOTE — ED Notes (Signed)
MD at bedside. Dr Opyd 

## 2016-06-19 NOTE — ED Triage Notes (Signed)
Patient states he had surgery to remove colon on October 18 and has had fever x 6 days. States last ibuprofen was at 1200 today.

## 2016-06-19 NOTE — ED Notes (Addendum)
Jonathan Wright, [facility] called to inform of need for vancomycin 1500 mg and Meropenem 1 gram IVPB.

## 2016-06-19 NOTE — ED Notes (Signed)
Vancomycin 1500 mg IVPB was discontinued then reordered by Dr Myna Hidalgo. This is why the order placed at 2107 was not scanned because it was started at 2050 from previous duplicate order.

## 2016-06-19 NOTE — ED Notes (Signed)
Heather, RN informed that pt still needs meropenem anbx- this nurse will bring to floor when pt comes up.

## 2016-06-19 NOTE — ED Notes (Signed)
MD at bedside. 

## 2016-06-19 NOTE — Telephone Encounter (Signed)
Tried to call patient back twice. Did leave a message and told him what Kirby Crigler PA-C said. Asked patient to call us back and let us know how things are going. Informed them that he needs to call [facility] back , could be related to surgery.

## 2016-06-19 NOTE — Telephone Encounter (Signed)
Patients significant other, [name], called stating patient had surgery at [facility] last week and since returning home, he has been running a fever constantly. [name] states he called [employer] last night and was told to call cancer center today. Patient continues to run a fever. [name] states it has been higher than 102. He is alternating Tylenol and Ibuprofen every 6 hours as instructed by the on call MD at [facility]. [name] also states patient rates his pain as 7/10 and the pain is "internal". After reviewing with both oncologist and PA-C, instructed [name] to take patient to ER. He could be septic or PICC line could be contaminated. [name] verbalized understanding.

## 2016-06-20 DIAGNOSIS — A4151 Sepsis due to Escherichia coli [E. coli]: Secondary | ICD-10-CM

## 2016-06-20 LAB — BLOOD CULTURE ID PANEL (REFLEXED)
ACINETOBACTER BAUMANNII: NOT DETECTED
CANDIDA ALBICANS: NOT DETECTED
CANDIDA KRUSEI: NOT DETECTED
CANDIDA PARAPSILOSIS: NOT DETECTED
Candida glabrata: NOT DETECTED
Candida tropicalis: NOT DETECTED
Carbapenem resistance: NOT DETECTED
ENTEROBACTER CLOACAE COMPLEX: NOT DETECTED
ENTEROBACTERIACEAE SPECIES: DETECTED — AB
Enterococcus species: NOT DETECTED
Escherichia coli: DETECTED — AB
Haemophilus influenzae: NOT DETECTED
KLEBSIELLA OXYTOCA: NOT DETECTED
KLEBSIELLA PNEUMONIAE: DETECTED — AB
Listeria monocytogenes: NOT DETECTED
Neisseria meningitidis: NOT DETECTED
PSEUDOMONAS AERUGINOSA: NOT DETECTED
Proteus species: NOT DETECTED
STREPTOCOCCUS PYOGENES: NOT DETECTED
Serratia marcescens: NOT DETECTED
Staphylococcus aureus (BCID): NOT DETECTED
Staphylococcus species: NOT DETECTED
Streptococcus agalactiae: NOT DETECTED
Streptococcus pneumoniae: NOT DETECTED
Streptococcus species: NOT DETECTED

## 2016-06-20 LAB — COMPREHENSIVE METABOLIC PANEL
ALBUMIN: 2.7 g/dL — AB (ref 3.5–5.0)
ALT: 45 U/L (ref 17–63)
AST: 26 U/L (ref 15–41)
Alkaline Phosphatase: 175 U/L — ABNORMAL HIGH (ref 38–126)
Anion gap: 7 (ref 5–15)
BILIRUBIN TOTAL: 0.6 mg/dL (ref 0.3–1.2)
BUN: 7 mg/dL (ref 6–20)
CO2: 25 mmol/L (ref 22–32)
Calcium: 8.3 mg/dL — ABNORMAL LOW (ref 8.9–10.3)
Chloride: 105 mmol/L (ref 101–111)
Creatinine, Ser: 0.82 mg/dL (ref 0.61–1.24)
GFR calc Af Amer: >60 mL/min (ref >60)
GFR calc non Af Amer: >60 mL/min (ref >60)
GLUCOSE: 128 mg/dL — AB (ref 65–99)
POTASSIUM: 3.9 mmol/L (ref 3.5–5.1)
Sodium: 137 mmol/L (ref 135–145)
TOTAL PROTEIN: 6.5 g/dL (ref 6.5–8.1)

## 2016-06-20 LAB — CBC WITH DIFFERENTIAL/PLATELET
BASOS ABS: 0 10*3/uL (ref 0.0–0.1)
Basophils Relative: 0 %
EOS PCT: 0 %
Eosinophils Absolute: 0 10*3/uL (ref 0.0–0.7)
HCT: 28 % — ABNORMAL LOW (ref 39.0–52.0)
Hemoglobin: 9.1 g/dL — ABNORMAL LOW (ref 13.0–17.0)
LYMPHS ABS: 0.6 10*3/uL — AB (ref 0.7–4.0)
LYMPHS PCT: 3 %
MCH: 31.5 pg (ref 26.0–34.0)
MCHC: 32.5 g/dL (ref 30.0–36.0)
MCV: 96.9 fL (ref 78.0–100.0)
MONO ABS: 1 10*3/uL (ref 0.1–1.0)
MONOS PCT: 6 %
Neutro Abs: 15.7 10*3/uL — ABNORMAL HIGH (ref 1.7–7.7)
Neutrophils Relative %: 91 %
PLATELETS: 482 10*3/uL — AB (ref 150–400)
RBC: 2.89 MIL/uL — ABNORMAL LOW (ref 4.22–5.81)
RDW: 13.7 % (ref 11.5–15.5)
WBC: 17.4 10*3/uL — ABNORMAL HIGH (ref 4.0–10.5)

## 2016-06-20 LAB — PROTIME-INR
INR: 1.14
Prothrombin Time: 14.7 seconds (ref 11.4–15.2)

## 2016-06-20 MED ORDER — ENOXAPARIN SODIUM 40 MG/0.4ML ~~LOC~~ SOLN
40.0000 mg | SUBCUTANEOUS | Status: DC
  Administered 2016-06-20 – 2016-06-22 (×3): 40 mg via SUBCUTANEOUS
  Filled 2016-06-20 (×3): qty 0.4

## 2016-06-20 NOTE — Progress Notes (Signed)
PROGRESS NOTE  Jonathan Wright  [id number] DOB: 1966-01-18 DOA: 06/19/2016 PCP: [name], MD Outpatient Specialists:  Subjective: Had fever of 102 this morning, but feels much better than yesterday. Blood cultures positive for Klebsiella and Escherichia coli. Called his surgeon in [place] twice and left message.  Brief Narrative:  Jonathan Wright is a 50 y.o. male with medical history significant for depression, anxiety, and rectal cancer status post chemotherapy, radiation, and resection on 06/07/2016, now presented to the emergency department with fevers, lower abdominal pain, rectal bleeding and dysuria. Patient had a resection for his rectal cancer performed at [facility] on 06/07/2016 and reports feeling well upon hospital discharge. He notes that he was walking 4-5 miles a day shortly after the surgery, but over the past 6 days, has felt increasingly ill with fevers to 102 F, lower abdominal pain, and dysuria. Initially, the fevers and malaise would respond to Advil or Tylenol, but symptoms have been more persistent over the last couple days and not amenable to his home treatments. Over the past couple days, he has noted bright red blood per rectum. He denies chest pain, palpitations, dyspnea, or cough. He also denies headache, change in vision or hearing, focal numbness or weakness, or loss of coordination.   Assessment & Plan:   Principal Problem:   Sepsis ([place]) Active Problems:   Rectal cancer ([place])   S/P PICC central line placement   UTI (urinary tract infection)   Perirectal abscess   Normocytic anemia   Hyponatremia   Depression with anxiety   Rectal bleeding   Fever of unknown origin (FUO)   Sepsis, unspecified organism ([place])   Sepsis, secondary to Escherichia coli/Klebsiella bacteremia - Presents with fever 103.2, leukocytosis 17.5 and presence of bacteremia and UTI/perirectal abscess  - Lactic acid is reassuring at 0.7, CXR is clear  - Blood  culture showed Escherichia coli and Klebsiella - Given a NS bolus and continued on NS infusion  - Pt has penicillin allergy and has been started on empiric vancomycin and meropenem   Perirectal abscess -Has recent abdomino-perineal resection done by Dr. Criselda Peaches at [facility]. Status post ileostomy. -CT showed a perirectal collection of complex fluid and scattered gas density with some enhancement along the margins, volume estimated at 340 cc.  -Gen. surgery consulted, recommended to continue antibiotics for now, consider IR drainage if not improving.  PE -Right lower lobe PE diagnosed on 04/13/2016, patient was on full dose of Lovenox. -Prophylactic dose of Lovenox, restart full dose of Lovenox if rectal bleeding resolved and no intervention planned.  UTI -Urinalysis showed TNTC, currently on meropenem.  Rectal cancer s/p resection  - Mass identified on colonoscopy in January 2017, confirmed to be adenocarcinoma  - Underwent radiation therapy at [facility] in April, then FOLFOX at [place] [facility] from May to July 2017, and then a resection with ileostomy at [facility] on 06/07/2016 - Surgery at [facility] has been notified of admission   Rectal bleeding with acute anemia  - Pt notes BRB per rectum for 2 days leading up to admission  - Hgb is 9.4 on admission, down from 10.6 on 10/21 - He is tachycardic on presentation, likely d/t the acute infection rather than acute blood loss, but is being fluid-resuscitated  - Type and screen requested  - Was on therapeutic anticoagulation, probably, patient will be on heparin, follow for any bleeding.  Hyponatremia  - Serum sodium 133 on admission in setting of dehydration  - This is  resolved after IV fluid hydration, sodium today is 137.  Depression, anxiety - Pt reports this to be stable; he no longer takes benzodiazepines  - Continue Lexapro     DVT prophylaxis: Subcutaneous Lovenox Code Status: Full Code Family  Communication:  Disposition Plan:  Diet: Diet clear liquid Room service appropriate? Yes; Fluid consistency: Thin  Consultants:   General surgery, Dr. Arnoldo Morale.  Procedures:   PICC line removal  Antimicrobials:   Meropenem and vancomycin  Objective: Vitals:   06/19/16 2100 06/20/16 0100 06/20/16 0639 06/20/16 0700  BP: 101/56  107/68   Pulse: 116  (!) 113   Resp: 17  20   Temp:  98.6 F (37 C) (!) 102 F (38.9 C) 100.3 F (37.9 C)  TempSrc:  Oral Oral Oral  SpO2: 94%  95%   Weight:   76.9 kg (169 lb 9.6 oz)   Height:   5\' 10"  (1.778 m)     Intake/Output Summary (Last 24 hours) at 06/20/16 1044 Last data filed at 06/20/16 0859  Gross per 24 hour  Intake              653 ml  Output              970 ml  Net             -317 ml   Filed Weights   06/19/16 1630 06/20/16 0639  Weight: 77.1 kg (170 lb) 76.9 kg (169 lb 9.6 oz)    Examination: General exam: Appears calm and comfortable  Respiratory system: Clear to auscultation. Respiratory effort normal. Cardiovascular system: S1 & S2 heard, RRR. No JVD, murmurs, rubs, gallops or clicks. No pedal edema. Gastrointestinal system: Abdomen is nondistended, soft and nontender. No organomegaly or masses felt. Normal bowel sounds heard. Central nervous system: Alert and oriented. No focal neurological deficits. Extremities: Symmetric 5 x 5 power. Skin: No rashes, lesions or ulcers Psychiatry: Judgement and insight appear normal. Mood & affect appropriate.   Data Reviewed: I have personally reviewed following labs and imaging studies  CBC:  Recent Labs Lab 06/19/16 1707 06/20/16 0646  WBC 17.5* 17.4*  NEUTROABS 14.6* 15.7*  HGB 9.4* 9.1*  HCT 28.7* 28.0*  MCV 97.3 96.9  PLT 571* 123XX123*   Basic Metabolic Panel:  Recent Labs Lab 06/19/16 1707 06/20/16 0646  NA 133* 137  K 3.8 3.9  CL 100* 105  CO2 25 25  GLUCOSE 102* 128*  BUN 8 7  CREATININE 1.01 0.82  CALCIUM 8.4* 8.3*   GFR: Estimated Creatinine  Clearance: 112.5 mL/min (by C-G formula based on SCr of 0.82 mg/dL). Liver Function Tests:  Recent Labs Lab 06/19/16 1707 06/20/16 0646  AST 21 26  ALT 46 45  ALKPHOS 154* 175*  BILITOT 0.5 0.6  PROT 7.0 6.5  ALBUMIN 2.9* 2.7*    Recent Labs Lab 06/19/16 1707  LIPASE 14   No results for input(s): AMMONIA in the last 168 hours. Coagulation Profile:  Recent Labs Lab 06/19/16 1803 06/20/16 0646  INR 1.12 1.14   Cardiac Enzymes: No results for input(s): CKTOTAL, CKMB, CKMBINDEX, TROPONINI in the last 168 hours. BNP (last 3 results) No results for input(s): PROBNP in the last 8760 hours. HbA1C: No results for input(s): HGBA1C in the last 72 hours. CBG: No results for input(s): GLUCAP in the last 168 hours. Lipid Profile: No results for input(s): CHOL, HDL, LDLCALC, TRIG, CHOLHDL, LDLDIRECT in the last 72 hours. Thyroid Function Tests: No results for input(s): TSH, T4TOTAL,  FREET4, T3FREE, THYROIDAB in the last 72 hours. Anemia Panel: No results for input(s): VITAMINB12, FOLATE, FERRITIN, TIBC, IRON, RETICCTPCT in the last 72 hours. Urine analysis:    Component Value Date/Time   COLORURINE YELLOW 06/19/2016 1809   APPEARANCEUR CLEAR 06/19/2016 1809   LABSPEC 1.020 06/19/2016 1809   PHURINE 5.5 06/19/2016 1809   GLUCOSEU NEGATIVE 06/19/2016 1809   HGBUR LARGE (A) 06/19/2016 1809   BILIRUBINUR NEGATIVE 06/19/2016 1809   KETONESUR NEGATIVE 06/19/2016 1809   PROTEINUR TRACE (A) 06/19/2016 1809   NITRITE NEGATIVE 06/19/2016 1809   LEUKOCYTESUR SMALL (A) 06/19/2016 1809   Sepsis Labs: @LABRCNTIP (procalcitonin:4,lacticidven:4)  ) Recent Results (from the past 240 hour(s))  Culture, blood (routine x 2)     Status: None (Preliminary result)   Collection Time: 06/19/16  5:07 PM  Result Value Ref Range Status   Specimen Description BLOOD PIC LINE  Final   Special Requests BOTTLES DRAWN AEROBIC AND ANAEROBIC 6CC  Final   Culture  Setup Time   Final    GRAM VARIABLE  ROD ANA AND AEB Gram Stain Report Called to,Read Back By and Verified With: TETREAULT H AT 0450 ON [id number] BY FORSYTH K CRITICAL RESULT CALLED TO, READ BACK BY AND VERIFIED WITH: Shellee Milo, PHARM AT [place] ON [id number] BY Rhea Bleacher    Culture   Final    TOO YOUNG TO READ Performed at [facility]    Report Status PENDING  Incomplete  Blood Culture ID Panel (Reflexed)     Status: Abnormal   Collection Time: 06/19/16  5:07 PM  Result Value Ref Range Status   Enterococcus species NOT DETECTED NOT DETECTED Final   Listeria monocytogenes NOT DETECTED NOT DETECTED Final   Staphylococcus species NOT DETECTED NOT DETECTED Final   Staphylococcus aureus NOT DETECTED NOT DETECTED Final   Streptococcus species NOT DETECTED NOT DETECTED Final   Streptococcus agalactiae NOT DETECTED NOT DETECTED Final   Streptococcus pneumoniae NOT DETECTED NOT DETECTED Final   Streptococcus pyogenes NOT DETECTED NOT DETECTED Final   Acinetobacter baumannii NOT DETECTED NOT DETECTED Final   Enterobacteriaceae species DETECTED (A) NOT DETECTED Final    Comment: CRITICAL RESULT CALLED TO, READ BACK BY AND VERIFIED WITH: L. SEAY, PHARM AT 0917 ON [id number] BY S. YARBROUGH    Enterobacter cloacae complex NOT DETECTED NOT DETECTED Final   Escherichia coli DETECTED (A) NOT DETECTED Final    Comment: CRITICAL RESULT CALLED TO, READ BACK BY AND VERIFIED WITH: L. SEAY, PHARM AT 0917 ON [id number] BY S. YARBROUGH    Klebsiella oxytoca NOT DETECTED NOT DETECTED Final   Klebsiella pneumoniae DETECTED (A) NOT DETECTED Final    Comment: CRITICAL RESULT CALLED TO, READ BACK BY AND VERIFIED WITH: L. SEAY,PHARM AT [id number] ON [id number] BY S. YARBROUGH    Proteus species NOT DETECTED NOT DETECTED Final   Serratia marcescens NOT DETECTED NOT DETECTED Final   Carbapenem resistance NOT DETECTED NOT DETECTED Final   Haemophilus influenzae NOT DETECTED NOT DETECTED Final   Neisseria meningitidis NOT DETECTED NOT DETECTED Final   Pseudomonas  aeruginosa NOT DETECTED NOT DETECTED Final   Candida albicans NOT DETECTED NOT DETECTED Final   Candida glabrata NOT DETECTED NOT DETECTED Final   Candida krusei NOT DETECTED NOT DETECTED Final   Candida parapsilosis NOT DETECTED NOT DETECTED Final   Candida tropicalis NOT DETECTED NOT DETECTED Final    Comment: Performed at [facility]  Culture, blood (routine x 2)     Status: None (Preliminary result)  Collection Time: 06/19/16  5:45 PM  Result Value Ref Range Status   Specimen Description BLOOD PIC LINE  Final   Special Requests BOTTLES DRAWN AEROBIC AND ANAEROBIC 6CC  Final   Culture  Setup Time   Final    GRAM VARIABLE ROD Gram Stain Report Called to,Read Back By and Verified With: TETREAULT H AT 0450 ON [id number] BY FORSYTH K IN BOTH AEROBIC AND ANAEROBIC BOTTLES CRITICAL RESULT CALLED TO, READ BACK BY AND VERIFIED WITH: Shellee Milo, PHARM AT [place] ON [id number] BY Rhea Bleacher    Culture   Final    TOO YOUNG TO READ Performed at [facility]    Report Status PENDING  Incomplete     Invalid input(s): PROCALCITONIN, LACTICACIDVEN   Radiology Studies: Dg Chest 2 View  Result Date: 06/19/2016 CLINICAL DATA:  Fever since Wednesday. Rectal bleeding. Rectal cancer. Recent surgery. EXAM: CHEST  2 VIEW COMPARISON:  None. FINDINGS: Right-sided PICC line terminates at the low SVC. Midline trachea. Normal heart size and mediastinal contours. No pleural effusion or pneumothorax. Subsegmental atelectasis involves the left lung base laterally. IMPRESSION: No acute process or explanation for fever. Electronically Signed   By: Abigail Miyamoto M.D.   On: 06/19/2016 18:06   Ct Abdomen Pelvis W Contrast  Result Date: 06/19/2016 CLINICAL DATA:  Rectal bleeding today. Diagnosis of rectal cancer April 2017. Colectomy June 07 2016 at [facility], fever for the past 6 days. EXAM: CT ABDOMEN AND PELVIS WITH CONTRAST TECHNIQUE: Multidetector CT imaging of the abdomen and pelvis was performed  using the standard protocol following bolus administration of intravenous contrast. CONTRAST:  153mL ISOVUE-300 IOPAMIDOL (ISOVUE-300) INJECTION 61% COMPARISON:  None. FINDINGS: Lower chest: Mild scarring or subsegmental atelectasis in both lower lobes. Hepatobiliary: Unremarkable Pancreas: Unremarkable Spleen: Unremarkable Adrenals/Urinary Tract: Posterior wall thickening in the urinary bladder but otherwise normal appearance along the urothelium and of the kidneys. No hydronephrosis. Adrenal glands normal. Stomach/Bowel: Right lower quadrant ileostomy. Are port the patient had a proper colectomy, but on today's exam there seems to be a decompressed colon following its expected course, with components showing apparent Haas tray shin for example in the transverse colon on image 19/4. There is likely been removal of the rectum, with a potential pole down of the sigmoid colon into a slightly protruding region of the perineum. There is fluid, gas, and some high density in the perirectal space through which this loop of colon extends, with the collection of gas and fluid measuring approximately 340 cc when I subtract out the volume of the bowel and surrounding mesentery coursing through this collection. Scattered locules of gas in the collection are shown on images 68 through 84/2. At 12 days postoperative these gas collections are abnormal and probably reflect early abscess formation. There is also some enhancement along the margins of this presacral/ perirectal collection. There is only some minimal stranding in the ischiorectal fossa bilaterally. No dilated bowel identified. Vascular/Lymphatic: Retroaortic left renal vein, no significant atherosclerotic calcification identified. No pathologic adenopathy noted. Reproductive: There is some indistinctness of tissue planes along the seminal vesicles and posterolateral prostate gland, probably postoperative. A component could be due to radiation therapy. Other:  Laparoscopy ports noted. Small amount of gas in the subcutaneous tissues the left anterior abdominal wall, image 44/2. Musculoskeletal: Loss of disc height at L5-S1 with vacuum disc phenomenon. In conjunction with disc bulges and facet arthropathy there is mild bilateral foraminal impingement at the L5-S1 level. IMPRESSION: 1. By report the patient has had  abdominoperineal proctocolectomy and diverting ileostomy. I do see the ileostomy, but the patient still appears to have most of the colon. The rectum is thought to have been removed and the sigmoid colon pulled through towards the anus. Please correlate with the original operative note to explain this appearance. In any case, there is a perirectal collection of complex fluid and scattered gas density with some enhancement along the margins, volume estimated at 340 cc. Twelve days postoperative is a little bit too late to still have gas in a postoperative collection, appearance suspicious for abscess of the perirectal space. 2. No complicating feature of the ileostomy is identified. 3. There is some posterior wall thickening in the urinary bladder along with indistinctness of tissue planes along the seminal vesicles and prostate gland. Much of this could be postoperative although radiation therapy can also cause thickening of the structures. 4. Mild impingement at L5-S1 due to spondylosis and degenerative disc disease. Electronically Signed   By: Van Clines M.D.   On: 06/19/2016 19:31        Scheduled Meds: . enoxaparin (LOVENOX) injection  40 mg Subcutaneous Q24H  . escitalopram  10 mg Oral Daily  . meropenem (MERREM) IV  1 g Intravenous Q8H  . sodium chloride  1,000 mL Intravenous Once  . sodium chloride flush  3 mL Intravenous Q12H  . vancomycin  1,000 mg Intravenous Q12H   Continuous Infusions:    LOS: 1 day    Time spent: 35 minutes    Treyce Spillers A, MD [facility] Pager [phone]  If 7PM-7AM, please contact  night-coverage [email or web address] Password [email or web address] 06/20/2016, 10:44 AM

## 2016-06-20 NOTE — Consult Note (Signed)
Reason for Consult: Fever of unknown origin Referring Physician: Dr. Noe Gens Mcburney is an 50 y.o. male.  HPI: Patient is a 50 year old white male status post laparoscopic low anterior resection with coloanal anastomosis at [facility] on 06/07/2016 for colon cancer who did well while at [facility]. He was discharged home and started having fevers. He also had a pressure sensation in the rectum, especially when he went to urinate. Due to ongoing fevers, he presented emergency room for further evaluation treatment. He was noted to have a leukocytosis to 17,000. He denied any significant abdominal pain, except for the pressure sensation in the pelvis. His ileostomy has been working well. He denies any nausea or vomiting. He was admitted to the hospital for IV antibiotics and workup of his fever of unknown origin. Since he was admitted, he states he has had some bloody discharge from the rectum, which has resulted in resolution of his pelvic pressure sensation. He states that he does feel somewhat better.  Past Medical History:  Diagnosis Date  . Anxiety   . Depression   . Rectal cancer ([place]) 12/17/2015    Past Surgical History:  Procedure Laterality Date  . ABDOMINAL PERINEAL BOWEL RESECTION  06/07/2016   [facility]  . COLON SURGERY    . DIVERTING ILEOSTOMY  06/07/2016   [facility]  . LAPAROSCOPIC LOW ANTERIOR RESCECTION WITH COLOANAL ANASTOMOSIS  06/07/2016   [facility]  . PERIPHERALLY INSERTED CENTRAL CATHETER INSERTION  11/2015    Family History  Problem Relation Age of Onset  . Rectal cancer Neg Hx     Social History:  reports that he has never smoked. He has never used smokeless tobacco. He reports that he drinks alcohol. He reports that he does not use drugs.  Allergies:  Allergies  Allergen Reactions  . Penicillins Hives    Has patient had a PCN reaction causing immediate rash, facial/tongue/throat swelling, SOB or lightheadedness with  hypotension: Yes Has patient had a PCN reaction causing severe rash involving mucus membranes or skin necrosis: No Has patient had a PCN reaction that required hospitalization No Has patient had a PCN reaction occurring within the last 10 years: No If all of the above answers are "NO", then may proceed with Cephalosporin use.   Marland Kitchen Penicillin G Other (See Comments)    Medications:  Prior to Admission:  Prescriptions Prior to Admission  Medication Sig Dispense Refill Last Dose  . enoxaparin (LOVENOX) 80 MG/0.8ML injection Inject 0.8 mLs (80 mg total) into the skin every 12 (twelve) hours. 60 Syringe 1 06/19/2016 at 900a  . escitalopram (LEXAPRO) 10 MG tablet Take 1 tablet (10 mg total) by mouth daily. 30 tablet 1 06/19/2016 at Unknown time  . gabapentin (NEURONTIN) 100 MG capsule Take 100 mg by mouth daily as needed. For neuropathy/pain. *Prescribed one capsule three times daily*  0 06/18/2016 at Unknown time  . Heparin Lock Flush (HEPARIN FLUSH, PORCINE,) 100 UNIT/ML injection Flush picc line with 2.75m (250 units) of heparin lock flush on MWF weekly (Patient taking differently: every Monday, Wednesday, and Friday. Flush picc line with 2.552m(250 units) of heparin lock flush on MWF weekly) 15 Syringe 0 06/19/2016 at Unknown time  . HYDROmorphone (DILAUDID) 2 MG tablet Take 2 mg by mouth 3 (three) times daily as needed.  0 unknown  . ibuprofen (ADVIL,MOTRIN) 400 MG tablet Take 400 mg by mouth 3 (three) times daily as needed for mild pain or moderate pain.   0 Past Week  at Unknown time  . oxyCODONE (OXY IR/ROXICODONE) 5 MG immediate release tablet Take 1 tablet (5 mg total) by mouth every 6 (six) hours as needed for severe pain (take 1-2 tabs po every 6 hrs as needed). (Patient taking differently: Take 5-10 mg by mouth every 6 (six) hours as needed for severe pain (take 1-2 tabs po every 6 hrs as needed). ) 30 tablet 0 06/19/2016 at 1200  . Sodium Chloride Flush (NORMAL SALINE FLUSH) 0.9 % SOLN Flush  picc line with 10 ml of normal saline on MWF weekly. (Patient taking differently: every Monday, Wednesday, and Friday. Flush picc line with 10 ml of normal saline on MWF weekly.) 20 Syringe 0 06/19/2016 at Unknown time  . ALPRAZolam (XANAX) 0.5 MG tablet Take 1 tablet (0.5 mg total) by mouth 2 (two) times daily as needed for anxiety. (Patient not taking: Reported on 06/19/2016) 30 tablet 1 Not Taking at Unknown time  . clonazePAM (KLONOPIN) 0.5 MG tablet Take 1 tablet (0.5 mg total) by mouth at bedtime. May take 2 tablet if needed (Patient not taking: Reported on 06/19/2016) 60 tablet 0 Not Taking at Unknown time  . dextrose 5 % SOLN 1,000 mL with fluorouracil 5 GM/100ML SOLN Inject into the vein. To infuse over 46 hours every 14 days. To start May 8   Taking  . OXALIPLATIN IV Inject into the vein. Every 14 days x 6 cycles. Starting May 8.   Taking  . prochlorperazine (COMPAZINE) 10 MG tablet Take 1 tablet (10 mg total) by mouth every 6 (six) hours as needed for nausea or vomiting. (Patient not taking: Reported on 05/31/2016) 30 tablet 2 Not Taking  . traMADol (ULTRAM) 50 MG tablet Take 1 tablet (50 mg total) by mouth every 6 (six) hours as needed. Reported on 01/04/2016 (Patient not taking: Reported on 05/31/2016) 45 tablet 0 Not Taking   Scheduled: . enoxaparin (LOVENOX) injection  40 mg Subcutaneous Q24H  . escitalopram  10 mg Oral Daily  . meropenem (MERREM) IV  1 g Intravenous Q8H  . sodium chloride  1,000 mL Intravenous Once  . sodium chloride flush  3 mL Intravenous Q12H  . vancomycin  1,000 mg Intravenous Q12H    Results for orders placed or performed during the hospital encounter of 06/19/16 (from the past 48 hour(s))  Culture, blood (routine x 2)     Status: None (Preliminary result)   Collection Time: 06/19/16  5:07 PM  Result Value Ref Range   Specimen Description BLOOD PIC LINE    Special Requests BOTTLES DRAWN AEROBIC AND ANAEROBIC 6CC    Culture  Setup Time      GRAM VARIABLE  ROD ANA AND AEB Gram Stain Report Called to,Read Back By and Verified With: TETREAULT H AT [id number] ON [id number] BY FORSYTH K Organism ID to follow Performed at [facility]    Culture PENDING    Report Status PENDING   Comprehensive metabolic panel     Status: Abnormal   Collection Time: 06/19/16  5:07 PM  Result Value Ref Range   Sodium 133 (L) 135 - 145 mmol/L   Potassium 3.8 3.5 - 5.1 mmol/L   Chloride 100 (L) 101 - 111 mmol/L   CO2 25 22 - 32 mmol/L   Glucose, Bld 102 (H) 65 - 99 mg/dL   BUN 8 6 - 20 mg/dL   Creatinine, Ser 1.01 0.61 - 1.24 mg/dL   Calcium 8.4 (L) 8.9 - 10.3 mg/dL   Total Protein 7.0 6.5 -  8.1 g/dL   Albumin 2.9 (L) 3.5 - 5.0 g/dL   AST 21 15 - 41 U/L   ALT 46 17 - 63 U/L   Alkaline Phosphatase 154 (H) 38 - 126 U/L   Total Bilirubin 0.5 0.3 - 1.2 mg/dL   GFR calc non Af Amer >60 >60 mL/min   GFR calc Af Amer >60 >60 mL/min    Comment: (NOTE) The eGFR has been calculated using the CKD EPI equation. This calculation has not been validated in all clinical situations. eGFR's persistently <60 mL/min signify possible Chronic Kidney Disease.    Anion gap 8 5 - 15  Lipase, blood     Status: None   Collection Time: 06/19/16  5:07 PM  Result Value Ref Range   Lipase 14 11 - 51 U/L  Lactic acid, plasma     Status: None   Collection Time: 06/19/16  5:07 PM  Result Value Ref Range   Lactic Acid, Venous 0.7 0.5 - 1.9 mmol/L  CBC with Differential     Status: Abnormal   Collection Time: 06/19/16  5:07 PM  Result Value Ref Range   WBC 17.5 (H) 4.0 - 10.5 K/uL   RBC 2.95 (L) 4.22 - 5.81 MIL/uL   Hemoglobin 9.4 (L) 13.0 - 17.0 g/dL   HCT 28.7 (L) 39.0 - 52.0 %   MCV 97.3 78.0 - 100.0 fL   MCH 31.9 26.0 - 34.0 pg   MCHC 32.8 30.0 - 36.0 g/dL   RDW 13.6 11.5 - 15.5 %   Platelets 571 (H) 150 - 400 K/uL   Neutrophils Relative % 83 %   Neutro Abs 14.6 (H) 1.7 - 7.7 K/uL   Lymphocytes Relative 5 %   Lymphs Abs 0.8 0.7 - 4.0 K/uL   Monocytes Relative 11 %    Monocytes Absolute 1.9 (H) 0.1 - 1.0 K/uL   Eosinophils Relative 1 %   Eosinophils Absolute 0.1 0.0 - 0.7 K/uL   Basophils Relative 0 %   Basophils Absolute 0.1 0.0 - 0.1 K/uL  Culture, blood (routine x 2)     Status: None (Preliminary result)   Collection Time: 06/19/16  5:45 PM  Result Value Ref Range   Specimen Description BLOOD PIC LINE    Special Requests BOTTLES DRAWN AEROBIC AND ANAEROBIC 6CC    Culture  Setup Time      GRAM VARIABLE ROD AEB BOTTLE Gram Stain Report Called to,Read Back By and Verified With: TETREAULT H AT [id number] ON [id number] BY FORSYTH K    Culture PENDING    Report Status PENDING   Procalcitonin     Status: None   Collection Time: 06/19/16  6:03 PM  Result Value Ref Range   Procalcitonin 0.25 ng/mL    Comment:        Interpretation: PCT (Procalcitonin) <= 0.5 ng/mL: Systemic infection (sepsis) is not likely. Local bacterial infection is possible. (NOTE)         ICU PCT Algorithm               Non ICU PCT Algorithm    ----------------------------     ------------------------------         PCT < 0.25 ng/mL                 PCT < 0.1 ng/mL     Stopping of antibiotics            Stopping of antibiotics       strongly encouraged.  strongly encouraged.    ----------------------------     ------------------------------       PCT level decrease by               PCT < 0.25 ng/mL       >= 80% from peak PCT       OR PCT 0.25 - 0.5 ng/mL          Stopping of antibiotics                                             encouraged.     Stopping of antibiotics           encouraged.    ----------------------------     ------------------------------       PCT level decrease by              PCT >= 0.25 ng/mL       < 80% from peak PCT        AND PCT >= 0.5 ng/mL            Continuin g antibiotics                                              encouraged.       Continuing antibiotics            encouraged.    ----------------------------      ------------------------------     PCT level increase compared          PCT > 0.5 ng/mL         with peak PCT AND          PCT >= 0.5 ng/mL             Escalation of antibiotics                                          strongly encouraged.      Escalation of antibiotics        strongly encouraged.   Protime-INR     Status: None   Collection Time: 06/19/16  6:03 PM  Result Value Ref Range   Prothrombin Time 14.5 11.4 - 15.2 seconds   INR 1.12   APTT     Status: Abnormal   Collection Time: 06/19/16  6:03 PM  Result Value Ref Range   aPTT 37 (H) 24 - 36 seconds    Comment:        IF BASELINE aPTT IS ELEVATED, SUGGEST PATIENT RISK ASSESSMENT BE USED TO DETERMINE APPROPRIATE ANTICOAGULANT THERAPY.   Urinalysis, Routine w reflex microscopic     Status: Abnormal   Collection Time: 06/19/16  6:09 PM  Result Value Ref Range   Color, Urine YELLOW YELLOW   APPearance CLEAR CLEAR   Specific Gravity, Urine 1.020 1.005 - 1.030   pH 5.5 5.0 - 8.0   Glucose, UA NEGATIVE NEGATIVE mg/dL   Hgb urine dipstick LARGE (A) NEGATIVE   Bilirubin Urine NEGATIVE NEGATIVE   Ketones, ur NEGATIVE NEGATIVE mg/dL   Protein, ur TRACE (A) NEGATIVE mg/dL   Nitrite NEGATIVE NEGATIVE   Leukocytes, UA SMALL (A)  NEGATIVE  Urine microscopic-add on     Status: Abnormal   Collection Time: 06/19/16  6:09 PM  Result Value Ref Range   Squamous Epithelial / LPF 0-5 (A) NONE SEEN   WBC, UA TOO NUMEROUS TO COUNT 0 - 5 WBC/hpf   RBC / HPF 0-5 0 - 5 RBC/hpf   Bacteria, UA FEW (A) NONE SEEN  Lactic acid, plasma     Status: None   Collection Time: 06/19/16  7:43 PM  Result Value Ref Range   Lactic Acid, Venous 0.8 0.5 - 1.9 mmol/L  Type and screen [facility]     Status: None   Collection Time: 06/19/16  9:10 PM  Result Value Ref Range   ABO/RH(D) A POS    Antibody Screen NEG    Sample Expiration 06/22/2016   CBC with Differential     Status: Abnormal   Collection Time: 06/20/16  6:46 AM  Result Value Ref  Range   WBC 17.4 (H) 4.0 - 10.5 K/uL   RBC 2.89 (L) 4.22 - 5.81 MIL/uL   Hemoglobin 9.1 (L) 13.0 - 17.0 g/dL   HCT 28.0 (L) 39.0 - 52.0 %   MCV 96.9 78.0 - 100.0 fL   MCH 31.5 26.0 - 34.0 pg   MCHC 32.5 30.0 - 36.0 g/dL   RDW 13.7 11.5 - 15.5 %   Platelets 482 (H) 150 - 400 K/uL   Neutrophils Relative % 91 %   Neutro Abs 15.7 (H) 1.7 - 7.7 K/uL   Lymphocytes Relative 3 %   Lymphs Abs 0.6 (L) 0.7 - 4.0 K/uL   Monocytes Relative 6 %   Monocytes Absolute 1.0 0.1 - 1.0 K/uL   Eosinophils Relative 0 %   Eosinophils Absolute 0.0 0.0 - 0.7 K/uL   Basophils Relative 0 %   Basophils Absolute 0.0 0.0 - 0.1 K/uL  Comprehensive metabolic panel     Status: Abnormal   Collection Time: 06/20/16  6:46 AM  Result Value Ref Range   Sodium 137 135 - 145 mmol/L   Potassium 3.9 3.5 - 5.1 mmol/L   Chloride 105 101 - 111 mmol/L   CO2 25 22 - 32 mmol/L   Glucose, Bld 128 (H) 65 - 99 mg/dL   BUN 7 6 - 20 mg/dL   Creatinine, Ser 0.82 0.61 - 1.24 mg/dL   Calcium 8.3 (L) 8.9 - 10.3 mg/dL   Total Protein 6.5 6.5 - 8.1 g/dL   Albumin 2.7 (L) 3.5 - 5.0 g/dL   AST 26 15 - 41 U/L   ALT 45 17 - 63 U/L   Alkaline Phosphatase 175 (H) 38 - 126 U/L   Total Bilirubin 0.6 0.3 - 1.2 mg/dL   GFR calc non Af Amer >60 >60 mL/min   GFR calc Af Amer >60 >60 mL/min    Comment: (NOTE) The eGFR has been calculated using the CKD EPI equation. This calculation has not been validated in all clinical situations. eGFR's persistently <60 mL/min signify possible Chronic Kidney Disease.    Anion gap 7 5 - 15  Protime-INR     Status: None   Collection Time: 06/20/16  6:46 AM  Result Value Ref Range   Prothrombin Time 14.7 11.4 - 15.2 seconds   INR 1.14     Dg Chest 2 View  Result Date: 06/19/2016 CLINICAL DATA:  Fever since Wednesday. Rectal bleeding. Rectal cancer. Recent surgery. EXAM: CHEST  2 VIEW COMPARISON:  None. FINDINGS: Right-sided PICC line terminates at the low  SVC. Midline trachea. Normal heart size and  mediastinal contours. No pleural effusion or pneumothorax. Subsegmental atelectasis involves the left lung base laterally. IMPRESSION: No acute process or explanation for fever. Electronically Signed   By: Abigail Miyamoto M.D.   On: 06/19/2016 18:06   Ct Abdomen Pelvis W Contrast  Result Date: 06/19/2016 CLINICAL DATA:  Rectal bleeding today. Diagnosis of rectal cancer April 2017. Colectomy June 07 2016 at [facility], fever for the past 6 days. EXAM: CT ABDOMEN AND PELVIS WITH CONTRAST TECHNIQUE: Multidetector CT imaging of the abdomen and pelvis was performed using the standard protocol following bolus administration of intravenous contrast. CONTRAST:  198m ISOVUE-300 IOPAMIDOL (ISOVUE-300) INJECTION 61% COMPARISON:  None. FINDINGS: Lower chest: Mild scarring or subsegmental atelectasis in both lower lobes. Hepatobiliary: Unremarkable Pancreas: Unremarkable Spleen: Unremarkable Adrenals/Urinary Tract: Posterior wall thickening in the urinary bladder but otherwise normal appearance along the urothelium and of the kidneys. No hydronephrosis. Adrenal glands normal. Stomach/Bowel: Right lower quadrant ileostomy. Are port the patient had a proper colectomy, but on today's exam there seems to be a decompressed colon following its expected course, with components showing apparent Haas tray shin for example in the transverse colon on image 19/4. There is likely been removal of the rectum, with a potential pole down of the sigmoid colon into a slightly protruding region of the perineum. There is fluid, gas, and some high density in the perirectal space through which this loop of colon extends, with the collection of gas and fluid measuring approximately 340 cc when I subtract out the volume of the bowel and surrounding mesentery coursing through this collection. Scattered locules of gas in the collection are shown on images 68 through 84/2. At 12 days postoperative these gas collections are abnormal and probably  reflect early abscess formation. There is also some enhancement along the margins of this presacral/ perirectal collection. There is only some minimal stranding in the ischiorectal fossa bilaterally. No dilated bowel identified. Vascular/Lymphatic: Retroaortic left renal vein, no significant atherosclerotic calcification identified. No pathologic adenopathy noted. Reproductive: There is some indistinctness of tissue planes along the seminal vesicles and posterolateral prostate gland, probably postoperative. A component could be due to radiation therapy. Other: Laparoscopy ports noted. Small amount of gas in the subcutaneous tissues the left anterior abdominal wall, image 44/2. Musculoskeletal: Loss of disc height at L5-S1 with vacuum disc phenomenon. In conjunction with disc bulges and facet arthropathy there is mild bilateral foraminal impingement at the L5-S1 level. IMPRESSION: 1. By report the patient has had abdominoperineal proctocolectomy and diverting ileostomy. I do see the ileostomy, but the patient still appears to have most of the colon. The rectum is thought to have been removed and the sigmoid colon pulled through towards the anus. Please correlate with the original operative note to explain this appearance. In any case, there is a perirectal collection of complex fluid and scattered gas density with some enhancement along the margins, volume estimated at 340 cc. Twelve days postoperative is a little bit too late to still have gas in a postoperative collection, appearance suspicious for abscess of the perirectal space. 2. No complicating feature of the ileostomy is identified. 3. There is some posterior wall thickening in the urinary bladder along with indistinctness of tissue planes along the seminal vesicles and prostate gland. Much of this could be postoperative although radiation therapy can also cause thickening of the structures. 4. Mild impingement at L5-S1 due to spondylosis and degenerative  disc disease. Electronically Signed   By: WThayer Jew  Janeece Fitting M.D.   On: 06/19/2016 19:31    ROS:  Pertinent items noted in HPI and remainder of comprehensive ROS otherwise negative.  Blood pressure 107/68, pulse (!) 113, temperature 100.3 F (37.9 C), temperature source Oral, resp. rate 20, height _0  (1.778 m), weight 76.9 kg (169 lb 9.6 oz), SpO2 95 %. Physical Exam: Well-developed well-nourished white male in no acute distress. Head is normocephalic, atraumatic. Neck is supple without lymphadenopathy. Lungs clear to auscultation with equal breath sounds bilaterally. Heart examination reveals a regular rate and rhythm without S3, S4, murmurs. Abdomen is soft and flat. Well-healed laparoscopic surgical scars are noted. An ileostomy is noted in the right lower quadrant. It is pink and patent. Rectal examination reveals no peritoneal tenderness or induration. A digital examination was not done due to his recent surgery.  Assessment/Plan: Impression: Fever of unknown etiology. Possibilities include PICC line infection as it has been in place since April of this year, urinary tract infection, or pelvic abscess. Patient states he did have a JP drain present postoperatively and was removed prior to discharge. He did say that the pressure sensation in the pelvis has resolved. With that being said, may wait another 24 hours to see clinically how he does before involving interventional radiology. Will remove PICC line. Patient states that this was placed for his chemotherapy. Will continue IV vancomycin and Merrem for now. May start clear liquid diet. Will follow with you.  Emelina Hinch A 06/20/2016, 8:16 AM

## 2016-06-20 NOTE — Progress Notes (Signed)
Received blood culture results from lab, informed MD, no further orders received.

## 2016-06-20 NOTE — Progress Notes (Signed)
PHARMACY - PHYSICIAN COMMUNICATION CRITICAL VALUE ALERT - BLOOD CULTURE IDENTIFICATION (BCID)  Results for orders placed or performed during the hospital encounter of 06/19/16  Blood Culture ID Panel (Reflexed) (Collected: 06/19/2016  5:07 PM)  Result Value Ref Range   Enterococcus species NOT DETECTED NOT DETECTED   Listeria monocytogenes NOT DETECTED NOT DETECTED   Staphylococcus species NOT DETECTED NOT DETECTED   Staphylococcus aureus NOT DETECTED NOT DETECTED   Streptococcus species NOT DETECTED NOT DETECTED   Streptococcus agalactiae NOT DETECTED NOT DETECTED   Streptococcus pneumoniae NOT DETECTED NOT DETECTED   Streptococcus pyogenes NOT DETECTED NOT DETECTED   Acinetobacter baumannii NOT DETECTED NOT DETECTED   Enterobacteriaceae species DETECTED (A) NOT DETECTED   Enterobacter cloacae complex NOT DETECTED NOT DETECTED   Escherichia coli DETECTED (A) NOT DETECTED   Klebsiella oxytoca NOT DETECTED NOT DETECTED   Klebsiella pneumoniae DETECTED (A) NOT DETECTED   Proteus species NOT DETECTED NOT DETECTED   Serratia marcescens NOT DETECTED NOT DETECTED   Carbapenem resistance NOT DETECTED NOT DETECTED   Haemophilus influenzae NOT DETECTED NOT DETECTED   Neisseria meningitidis NOT DETECTED NOT DETECTED   Pseudomonas aeruginosa NOT DETECTED NOT DETECTED   Candida albicans NOT DETECTED NOT DETECTED   Candida glabrata NOT DETECTED NOT DETECTED   Candida krusei NOT DETECTED NOT DETECTED   Candida parapsilosis NOT DETECTED NOT DETECTED   Candida tropicalis NOT DETECTED NOT DETECTED    Name of physician (or Provider) Contacted: Dr. Hartford Poli  Plan: Continue current regimen. Abdomen with possible abscess, may need draining.  F/U cultures and deescalate as indicated  Isac Sarna, BS [place], [place] Clinical Pharmacist Pager [phone] 06/20/2016  9:47 AM

## 2016-06-20 NOTE — Progress Notes (Signed)
Notified [facility] 1 and Dr. Arnoldo Morale that the patient did not want his PICC removed at this time.  The patient voices that he would like to talk more with his MD in [place] prior to taking it out.  Both MDs verbalized understanding and no new orders was given at this time.

## 2016-06-21 DIAGNOSIS — R509 Fever, unspecified: Secondary | ICD-10-CM

## 2016-06-21 LAB — BASIC METABOLIC PANEL
ANION GAP: 5 (ref 5–15)
BUN: <5 mg/dL — ABNORMAL LOW (ref 6–20)
CALCIUM: 8.1 mg/dL — AB (ref 8.9–10.3)
CO2: 27 mmol/L (ref 22–32)
Chloride: 104 mmol/L (ref 101–111)
Creatinine, Ser: 0.66 mg/dL (ref 0.61–1.24)
Glucose, Bld: 117 mg/dL — ABNORMAL HIGH (ref 65–99)
Potassium: 3.5 mmol/L (ref 3.5–5.1)
Sodium: 136 mmol/L (ref 135–145)

## 2016-06-21 LAB — CBC
HCT: 26.3 % — ABNORMAL LOW (ref 39.0–52.0)
HEMOGLOBIN: 8.5 g/dL — AB (ref 13.0–17.0)
MCH: 31.4 pg (ref 26.0–34.0)
MCHC: 32.3 g/dL (ref 30.0–36.0)
MCV: 97 fL (ref 78.0–100.0)
Platelets: 429 10*3/uL — ABNORMAL HIGH (ref 150–400)
RBC: 2.71 MIL/uL — AB (ref 4.22–5.81)
RDW: 14.1 % (ref 11.5–15.5)
WBC: 7.5 10*3/uL (ref 4.0–10.5)

## 2016-06-21 LAB — URINE CULTURE: CULTURE: NO GROWTH

## 2016-06-21 LAB — MAGNESIUM: MAGNESIUM: 2 mg/dL (ref 1.7–2.4)

## 2016-06-21 LAB — PHOSPHORUS: PHOSPHORUS: 2.8 mg/dL (ref 2.5–4.6)

## 2016-06-21 NOTE — Progress Notes (Signed)
Subjective: Patient states he feels better. He still is a pressure sensation in his pelvic region, though it is significantly less than before. Still with some bloody drainage from the rectum. He states it is mucousy in nature.  Objective: Vital signs in last 24 hours: Temp:  [98.3 F (36.8 C)-100.2 F (37.9 C)] 99.3 F (37.4 C) (11/01 0504) Pulse Rate:  [86-96] 86 (11/01 0504) Resp:  [18-20] 18 (11/01 0504) BP: (104-112)/(55-68) 104/59 (11/01 0504) SpO2:  [98 %-100 %] 99 % (11/01 0504) Weight:  [76.2 kg (167 lb 15.9 oz)] 76.2 kg (167 lb 15.9 oz) (11/01 0504) Last BM Date: 06/19/16  Intake/Output from previous day: 10/31 0701 - 11/01 0700 In: 243 [P.O.:240; I.V.:3] Out: -  Intake/Output this shift: Total I/O In: 240 [P.O.:240] Out: -   General appearance: alert, cooperative and no distress GI: Soft, flat. Ileostomy pink and patent. No rigidity noted.  Lab Results:   Recent Labs  06/20/16 0646 06/21/16 0646  WBC 17.4* 7.5  HGB 9.1* 8.5*  HCT 28.0* 26.3*  PLT 482* 429*   BMET  Recent Labs  06/20/16 0646 06/21/16 0646  NA 137 136  K 3.9 3.5  CL 105 104  CO2 25 27  GLUCOSE 128* 117*  BUN 7 <5*  CREATININE 0.82 0.66  CALCIUM 8.3* 8.1*   PT/INR  Recent Labs  06/19/16 1803 06/20/16 0646  LABPROT 14.5 14.7  INR 1.12 1.14    Studies/Results: Dg Chest 2 View  Result Date: 06/19/2016 CLINICAL DATA:  Fever since Wednesday. Rectal bleeding. Rectal cancer. Recent surgery. EXAM: CHEST  2 VIEW COMPARISON:  None. FINDINGS: Right-sided PICC line terminates at the low SVC. Midline trachea. Normal heart size and mediastinal contours. No pleural effusion or pneumothorax. Subsegmental atelectasis involves the left lung base laterally. IMPRESSION: No acute process or explanation for fever. Electronically Signed   By: Abigail Miyamoto M.D.   On: 06/19/2016 18:06   Ct Abdomen Pelvis W Contrast  Result Date: 06/19/2016 CLINICAL DATA:  Rectal bleeding today. Diagnosis of  rectal cancer April 2017. Colectomy June 07 2016 at [facility], fever for the past 6 days. EXAM: CT ABDOMEN AND PELVIS WITH CONTRAST TECHNIQUE: Multidetector CT imaging of the abdomen and pelvis was performed using the standard protocol following bolus administration of intravenous contrast. CONTRAST:  168mL ISOVUE-300 IOPAMIDOL (ISOVUE-300) INJECTION 61% COMPARISON:  None. FINDINGS: Lower chest: Mild scarring or subsegmental atelectasis in both lower lobes. Hepatobiliary: Unremarkable Pancreas: Unremarkable Spleen: Unremarkable Adrenals/Urinary Tract: Posterior wall thickening in the urinary bladder but otherwise normal appearance along the urothelium and of the kidneys. No hydronephrosis. Adrenal glands normal. Stomach/Bowel: Right lower quadrant ileostomy. Are port the patient had a proper colectomy, but on today's exam there seems to be a decompressed colon following its expected course, with components showing apparent Haas tray shin for example in the transverse colon on image 19/4. There is likely been removal of the rectum, with a potential pole down of the sigmoid colon into a slightly protruding region of the perineum. There is fluid, gas, and some high density in the perirectal space through which this loop of colon extends, with the collection of gas and fluid measuring approximately 340 cc when I subtract out the volume of the bowel and surrounding mesentery coursing through this collection. Scattered locules of gas in the collection are shown on images 68 through 84/2. At 12 days postoperative these gas collections are abnormal and probably reflect early abscess formation. There is also some enhancement along the margins of  this presacral/ perirectal collection. There is only some minimal stranding in the ischiorectal fossa bilaterally. No dilated bowel identified. Vascular/Lymphatic: Retroaortic left renal vein, no significant atherosclerotic calcification identified. No pathologic adenopathy  noted. Reproductive: There is some indistinctness of tissue planes along the seminal vesicles and posterolateral prostate gland, probably postoperative. A component could be due to radiation therapy. Other: Laparoscopy ports noted. Small amount of gas in the subcutaneous tissues the left anterior abdominal wall, image 44/2. Musculoskeletal: Loss of disc height at L5-S1 with vacuum disc phenomenon. In conjunction with disc bulges and facet arthropathy there is mild bilateral foraminal impingement at the L5-S1 level. IMPRESSION: 1. By report the patient has had abdominoperineal proctocolectomy and diverting ileostomy. I do see the ileostomy, but the patient still appears to have most of the colon. The rectum is thought to have been removed and the sigmoid colon pulled through towards the anus. Please correlate with the original operative note to explain this appearance. In any case, there is a perirectal collection of complex fluid and scattered gas density with some enhancement along the margins, volume estimated at 340 cc. Twelve days postoperative is a little bit too late to still have gas in a postoperative collection, appearance suspicious for abscess of the perirectal space. 2. No complicating feature of the ileostomy is identified. 3. There is some posterior wall thickening in the urinary bladder along with indistinctness of tissue planes along the seminal vesicles and prostate gland. Much of this could be postoperative although radiation therapy can also cause thickening of the structures. 4. Mild impingement at L5-S1 due to spondylosis and degenerative disc disease. Electronically Signed   By: Van Clines M.D.   On: 06/19/2016 19:31    Anti-infectives: Anti-infectives    Start     Dose/Rate Route Frequency Ordered Stop   06/20/16 0800  vancomycin (VANCOCIN) IVPB 1000 mg/200 mL premix     1,000 mg 200 mL/hr over 60 Minutes Intravenous Every 12 hours 06/19/16 2008     06/19/16 2200  meropenem  (MERREM) 1 g in sodium chloride 0.9 % 100 mL IVPB     1 g 200 mL/hr over 30 Minutes Intravenous Every 8 hours 06/19/16 2003     06/19/16 2115  vancomycin (VANCOCIN) 1,500 mg in sodium chloride 0.9 % 500 mL IVPB  Status:  Discontinued     1,500 mg 250 mL/hr over 120 Minutes Intravenous  Once 06/19/16 2107 06/19/16 2112   06/19/16 2015  vancomycin (VANCOCIN) 1,500 mg in sodium chloride 0.9 % 500 mL IVPB  Status:  Discontinued     1,500 mg 250 mL/hr over 120 Minutes Intravenous  Once 06/19/16 2004 06/19/16 2250      Assessment/Plan: Impression: Initial blood cultures revealed bacteremia secondary to Klebsiella, Escherichia coli, and Enterobacter. Leukocytosis has resolved. Thus, we'll hold off on repeat CAT scan of pelvis. Patient initially refused PICC line removal, now has consented to PICC line removal due to the positive blood cultures. May be advanced to soft diet. Awaiting final sensitivities of cultures. Patient seen with Dr. Candiss Norse.  LOS: 2 days    Jonathan Wright A 06/21/2016

## 2016-06-21 NOTE — Progress Notes (Signed)
PROGRESS NOTE                                                                                                                                                                                                             Patient Demographics:    Jonathan Wright, is a 50 y.o. male, DOB - 11-Apr-1966, [id number]  Admit date - 06/19/2016   Admitting Physician Vianne Bulls, MD  Outpatient Primary MD for the patient is [name], MD  LOS - 2  Chief Complaint  Patient presents with  . Fever       Brief Narrative   Jonathan Wright is a 50 y.o. male with medical history significant for depression, anxiety, and rectal cancer status post chemotherapy, radiation, and resection on 06/07/2016, now presented to the emergency department with fevers, lower abdominal pain, rectal bleeding and dysuria. Patient had a resection for his rectal cancer performed at [facility] on 06/07/2016 and reports feeling well upon hospital discharge. He notes that he was walking 4-5 miles a day shortly after the surgery, but over the past 6 days, has felt increasingly ill with fevers to 102 F, lower abdominal pain, and dysuria.  Further workup suggested bacteremia and sepsis along with possible perirectal abscess. Surgery following abscess draining by itself.   Subjective:    Jonathan Wright today has, No headache, No chest pain, No abdominal pain - No Nausea, No new weakness tingling or numbness, No Cough - SOB. Mild rectal pressure.    Assessment  & Plan :     1.Sepsis due to multiple gram-negative active uremia. Likely source perirectal infection post rectal surgery at [facility] few weeks ago, general surgery following, perirectal abscess draining by itself, white count and temperatures improved. For now continue empiric antibiotics until culture sensitivity have finalized. He has a right arm PICC line which will be discontinued as has  been there for a few weeks. We'll likely stop vancomycin on 06/22/2016. Continue gram-negative coverage for now.  2. 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 Currently under the care of Dr. Whitney Muse, plan is to follow with  her post discharge.  3. History of PE diagnosed 04/13/2016. Currently on Lovenox prophylactic dose per surgery as he had some bleeding around his perirectal area, switch to full dose on 06/22/2016 if H&H remains stable.  4. UTI. For now continue meropenem and follow cultures.  5. Anxiety and depression. Continue Lexapro.  6. Hyponatremia due to dehydration. Resolved with IV fluids.  7. Acute rectal bleeding related anemia. Baseline hemoglobin around 14, bleeding seems to have resolved, Lovenox low-dose for another day, type screen and monitor.    Family Communication  :  None  Code Status :  Full  Diet : Heart Healthy  Disposition Plan  :  Home 3-4 days  Consults  :  Surgery  Procedures  :      DVT Prophylaxis  :  Lovenox     Lab Results  Component Value Date   PLT 429 (H) 06/21/2016    Inpatient Medications  Scheduled Meds: . enoxaparin (LOVENOX) injection  40 mg Subcutaneous Q24H  . escitalopram  10 mg Oral Daily  . meropenem (MERREM) IV  1 g Intravenous Q8H  . sodium chloride  1,000 mL Intravenous Once  . sodium chloride flush  3 mL Intravenous Q12H  . vancomycin  1,000 mg Intravenous Q12H   Continuous Infusions:  PRN Meds:.acetaminophen **OR** acetaminophen, HYDROmorphone (DILAUDID) injection, ibuprofen, ondansetron **OR** ondansetron (ZOFRAN) IV  Antibiotics  :    Anti-infectives    Start     Dose/Rate Route Frequency Ordered Stop   06/20/16 0800  vancomycin (VANCOCIN) IVPB  1000 mg/200 mL premix     1,000 mg 200 mL/hr over 60 Minutes Intravenous Every 12 hours 06/19/16 2008     06/19/16 2200  meropenem (MERREM) 1 g in sodium chloride 0.9 % 100 mL IVPB     1 g 200 mL/hr over 30 Minutes Intravenous Every 8 hours 06/19/16 2003     06/19/16 2115  vancomycin (VANCOCIN) 1,500 mg in sodium chloride 0.9 % 500 mL IVPB  Status:  Discontinued     1,500 mg 250 mL/hr over 120 Minutes Intravenous  Once 06/19/16 2107 06/19/16 2112   06/19/16 2015  vancomycin (VANCOCIN) 1,500 mg in sodium chloride 0.9 % 500 mL IVPB  Status:  Discontinued     1,500 mg 250 mL/hr over 120 Minutes Intravenous  Once 06/19/16 2004 06/19/16 2250         Objective:   Vitals:   06/20/16 2010 06/20/16 2135 06/21/16 0400 06/21/16 0504  BP:  (!) 107/55  (!) 104/59  Pulse: 92 88  86  Resp: 18 18  18   Temp:  99.9 F (37.7 C) 99.5 F (37.5 C) 99.3 F (37.4 C)  TempSrc:  Oral Oral Oral  SpO2: 99% 98%  99%  Weight:    76.2 kg (167 lb 15.9 oz)  Height:        Wt Readings from Last 3 Encounters:  06/21/16 76.2 kg (167 lb 15.9 oz)  05/31/16 79.8 kg (176 lb)  03/14/16 77.7 kg (171 lb 6.4 oz)     Intake/Output Summary (Last 24 hours) at 06/21/16 1006 Last data filed at 06/21/16 0900  Gross per 24 hour  Intake              483 ml  Output                0 ml  Net              483 ml     Physical Exam  Awake Alert, Oriented X 3, No new F.N deficits, Normal affect [place].AT,PERRAL Supple Neck,No JVD, No cervical lymphadenopathy appriciated.  Symmetrical Chest wall movement, Good air movement bilaterally, CTAB RRR,No Gallops,Rubs or new Murmurs, No Parasternal Heave +ve B.Sounds, Abd Soft, No tenderness, No organomegaly appriciated, No rebound - guarding or rigidity. No Cyanosis, Clubbing or edema, No new Rash or bruise       Data Review:    CBC  Recent Labs Lab 06/19/16 1707 06/20/16 0646 06/21/16 0646  WBC 17.5* 17.4* 7.5  HGB 9.4* 9.1* 8.5*  HCT 28.7* 28.0* 26.3*  PLT 571*  482* 429*  MCV 97.3 96.9 97.0  MCH 31.9 31.5 31.4  MCHC 32.8 32.5 32.3  RDW 13.6 13.7 14.1  LYMPHSABS 0.8 0.6*  --   MONOABS 1.9* 1.0  --   EOSABS 0.1 0.0  --   BASOSABS 0.1 0.0  --     Chemistries   Recent Labs Lab 06/19/16 1707 06/20/16 0646 06/21/16 0646  NA 133* 137 136  K 3.8 3.9 3.5  CL 100* 105 104  CO2 25 25 27   GLUCOSE 102* 128* 117*  BUN 8 7 <5*  CREATININE 1.01 0.82 0.66  CALCIUM 8.4* 8.3* 8.1*  MG  --   --  2.0  AST 21 26  --   ALT 46 45  --   ALKPHOS 154* 175*  --   BILITOT 0.5 0.6  --    ------------------------------------------------------------------------------------------------------------------ No results for input(s): CHOL, HDL, LDLCALC, TRIG, CHOLHDL, LDLDIRECT in the last 72 hours.  No results found for: HGBA1C ------------------------------------------------------------------------------------------------------------------ No results for input(s): TSH, T4TOTAL, T3FREE, THYROIDAB in the last 72 hours.  Invalid input(s): FREET3 ------------------------------------------------------------------------------------------------------------------ No results for input(s): VITAMINB12, FOLATE, FERRITIN, TIBC, IRON, RETICCTPCT in the last 72 hours.  Coagulation profile  Recent Labs Lab 06/19/16 1803 06/20/16 0646  INR 1.12 1.14    No results for input(s): DDIMER in the last 72 hours.  Cardiac Enzymes No results for input(s): CKMB, TROPONINI, MYOGLOBIN in the last 168 hours.  Invalid input(s): CK ------------------------------------------------------------------------------------------------------------------ No results found for: BNP  Micro Results Recent Results (from the past 240 hour(s))  Culture, blood (routine x 2)     Status: None (Preliminary result)   Collection Time: 06/19/16  5:07 PM  Result Value Ref Range Status   Specimen Description BLOOD PIC LINE  Final   Special Requests BOTTLES DRAWN AEROBIC AND ANAEROBIC 6CC  Final    Culture  Setup Time   Final    GRAM VARIABLE ROD ANA AND AEB Gram Stain Report Called to,Read Back By and Verified With: TETREAULT H AT 0450 ON [id number] BY FORSYTH K CRITICAL RESULT CALLED TO, READ BACK BY AND VERIFIED WITH: Shellee Milo, PHARM AT 0917 ON [id number] BY Rhea Bleacher    Culture   Final    CULTURE REINCUBATED FOR BETTER GROWTH Performed at [facility]    Report Status PENDING  Incomplete  Blood Culture ID Panel (Reflexed)     Status: Abnormal   Collection Time: 06/19/16  5:07 PM  Result Value Ref Range Status   Enterococcus species NOT DETECTED NOT DETECTED Final   Listeria monocytogenes NOT DETECTED NOT DETECTED Final   Staphylococcus species NOT DETECTED NOT DETECTED Final   Staphylococcus aureus NOT DETECTED NOT DETECTED Final   Streptococcus species NOT DETECTED NOT DETECTED Final   Streptococcus agalactiae NOT DETECTED NOT DETECTED Final   Streptococcus pneumoniae NOT DETECTED NOT DETECTED Final   Streptococcus pyogenes NOT DETECTED NOT DETECTED Final  Acinetobacter baumannii NOT DETECTED NOT DETECTED Final   Enterobacteriaceae species DETECTED (A) NOT DETECTED Final    Comment: CRITICAL RESULT CALLED TO, READ BACK BY AND VERIFIED WITH: L. SEAY, PHARM AT 0917 ON [id number] BY S. YARBROUGH    Enterobacter cloacae complex NOT DETECTED NOT DETECTED Final   Escherichia coli DETECTED (A) NOT DETECTED Final    Comment: CRITICAL RESULT CALLED TO, READ BACK BY AND VERIFIED WITH: L. SEAY, PHARM AT 0917 ON [id number] BY S. YARBROUGH    Klebsiella oxytoca NOT DETECTED NOT DETECTED Final   Klebsiella pneumoniae DETECTED (A) NOT DETECTED Final    Comment: CRITICAL RESULT CALLED TO, READ BACK BY AND VERIFIED WITH: L. SEAY,PHARM AT 0917 ON [id number] BY S. YARBROUGH    Proteus species NOT DETECTED NOT DETECTED Final   Serratia marcescens NOT DETECTED NOT DETECTED Final   Carbapenem resistance NOT DETECTED NOT DETECTED Final   Haemophilus influenzae NOT DETECTED NOT DETECTED Final    Neisseria meningitidis NOT DETECTED NOT DETECTED Final   Pseudomonas aeruginosa NOT DETECTED NOT DETECTED Final   Candida albicans NOT DETECTED NOT DETECTED Final   Candida glabrata NOT DETECTED NOT DETECTED Final   Candida krusei NOT DETECTED NOT DETECTED Final   Candida parapsilosis NOT DETECTED NOT DETECTED Final   Candida tropicalis NOT DETECTED NOT DETECTED Final    Comment: Performed at [facility]  Culture, blood (routine x 2)     Status: None (Preliminary result)   Collection Time: 06/19/16  5:45 PM  Result Value Ref Range Status   Specimen Description BLOOD PIC LINE  Final   Special Requests BOTTLES DRAWN AEROBIC AND ANAEROBIC 6CC  Final   Culture  Setup Time   Final    GRAM VARIABLE ROD Gram Stain Report Called to,Read Back By and Verified With: [place] AT [id number] ON [id number] BY FORSYTH K IN BOTH AEROBIC AND ANAEROBIC BOTTLES CRITICAL RESULT CALLED TO, READ BACK BY AND VERIFIED WITHShellee Milo, PHARM AT [place] ON [id number] BY Rhea Bleacher Performed at [facility]  Final   Report Status PENDING  Incomplete  Urine culture     Status: None   Collection Time: 06/19/16  6:09 PM  Result Value Ref Range Status   Specimen Description URINE, RANDOM  Final   Special Requests NONE  Final   Culture NO GROWTH Performed at [facility]   Final   Report Status 06/21/2016 FINAL  Final    Radiology Reports Dg Chest 2 View  Result Date: 06/19/2016 CLINICAL DATA:  Fever since Wednesday. Rectal bleeding. Rectal cancer. Recent surgery. EXAM: CHEST  2 VIEW COMPARISON:  None. FINDINGS: Right-sided PICC line terminates at the low SVC. Midline trachea. Normal heart size and mediastinal contours. No pleural effusion or pneumothorax. Subsegmental atelectasis involves the left lung base laterally. IMPRESSION: No acute process or explanation for fever. Electronically Signed   By: Abigail Miyamoto M.D.   On: 06/19/2016 18:06   Ct Abdomen Pelvis W  Contrast  Result Date: 06/19/2016 CLINICAL DATA:  Rectal bleeding today. Diagnosis of rectal cancer April 2017. Colectomy June 07 2016 at [facility], fever for the past 6 days. EXAM: CT ABDOMEN AND PELVIS WITH CONTRAST TECHNIQUE: Multidetector CT imaging of the abdomen and pelvis was performed using the standard protocol following bolus administration of intravenous contrast. CONTRAST:  181mL ISOVUE-300 IOPAMIDOL (ISOVUE-300) INJECTION 61% COMPARISON:  None. FINDINGS: Lower chest: Mild scarring or subsegmental atelectasis in both lower lobes. Hepatobiliary: Unremarkable  Pancreas: Unremarkable Spleen: Unremarkable Adrenals/Urinary Tract: Posterior wall thickening in the urinary bladder but otherwise normal appearance along the urothelium and of the kidneys. No hydronephrosis. Adrenal glands normal. Stomach/Bowel: Right lower quadrant ileostomy. Are port the patient had a proper colectomy, but on today's exam there seems to be a decompressed colon following its expected course, with components showing apparent Haas tray shin for example in the transverse colon on image 19/4. There is likely been removal of the rectum, with a potential pole down of the sigmoid colon into a slightly protruding region of the perineum. There is fluid, gas, and some high density in the perirectal space through which this loop of colon extends, with the collection of gas and fluid measuring approximately 340 cc when I subtract out the volume of the bowel and surrounding mesentery coursing through this collection. Scattered locules of gas in the collection are shown on images 68 through 84/2. At 12 days postoperative these gas collections are abnormal and probably reflect early abscess formation. There is also some enhancement along the margins of this presacral/ perirectal collection. There is only some minimal stranding in the ischiorectal fossa bilaterally. No dilated bowel identified. Vascular/Lymphatic: Retroaortic left renal  vein, no significant atherosclerotic calcification identified. No pathologic adenopathy noted. Reproductive: There is some indistinctness of tissue planes along the seminal vesicles and posterolateral prostate gland, probably postoperative. A component could be due to radiation therapy. Other: Laparoscopy ports noted. Small amount of gas in the subcutaneous tissues the left anterior abdominal wall, image 44/2. Musculoskeletal: Loss of disc height at L5-S1 with vacuum disc phenomenon. In conjunction with disc bulges and facet arthropathy there is mild bilateral foraminal impingement at the L5-S1 level. IMPRESSION: 1. By report the patient has had abdominoperineal proctocolectomy and diverting ileostomy. I do see the ileostomy, but the patient still appears to have most of the colon. The rectum is thought to have been removed and the sigmoid colon pulled through towards the anus. Please correlate with the original operative note to explain this appearance. In any case, there is a perirectal collection of complex fluid and scattered gas density with some enhancement along the margins, volume estimated at 340 cc. Twelve days postoperative is a little bit too late to still have gas in a postoperative collection, appearance suspicious for abscess of the perirectal space. 2. No complicating feature of the ileostomy is identified. 3. There is some posterior wall thickening in the urinary bladder along with indistinctness of tissue planes along the seminal vesicles and prostate gland. Much of this could be postoperative although radiation therapy can also cause thickening of the structures. 4. Mild impingement at L5-S1 due to spondylosis and degenerative disc disease. Electronically Signed   By: Van Clines M.D.   On: 06/19/2016 19:31    Time Spent in minutes  30   Tahtiana Rozier K M.D on 06/21/2016 at 10:06 AM  Between 7am to 7pm - Pager - [phone]  After 7pm go to [email or web address] - password [facility]  [facility] -  Office  [phone]

## 2016-06-22 DIAGNOSIS — F418 Other specified anxiety disorders: Secondary | ICD-10-CM

## 2016-06-22 LAB — CBC
HCT: 27.8 % — ABNORMAL LOW (ref 39.0–52.0)
Hemoglobin: 8.9 g/dL — ABNORMAL LOW (ref 13.0–17.0)
MCH: 31.1 pg (ref 26.0–34.0)
MCHC: 32 g/dL (ref 30.0–36.0)
MCV: 97.2 fL (ref 78.0–100.0)
PLATELETS: 460 10*3/uL — AB (ref 150–400)
RBC: 2.86 MIL/uL — AB (ref 4.22–5.81)
RDW: 14 % (ref 11.5–15.5)
WBC: 5.3 10*3/uL (ref 4.0–10.5)

## 2016-06-22 LAB — GLUCOSE, CAPILLARY: Glucose-Capillary: 89 mg/dL (ref 65–99)

## 2016-06-22 MED ORDER — FOLIC ACID 1 MG PO TABS
1.0000 mg | ORAL_TABLET | Freq: Every day | ORAL | Status: DC
  Administered 2016-06-22 – 2016-06-23 (×2): 1 mg via ORAL
  Filled 2016-06-22 (×2): qty 1

## 2016-06-22 MED ORDER — FERROUS SULFATE 325 (65 FE) MG PO TABS
325.0000 mg | ORAL_TABLET | Freq: Two times a day (BID) | ORAL | Status: DC
  Administered 2016-06-22 – 2016-06-23 (×2): 325 mg via ORAL
  Filled 2016-06-22 (×2): qty 1

## 2016-06-22 MED ORDER — ENOXAPARIN SODIUM 80 MG/0.8ML ~~LOC~~ SOLN
80.0000 mg | Freq: Two times a day (BID) | SUBCUTANEOUS | Status: DC
  Administered 2016-06-22 – 2016-06-23 (×2): 80 mg via SUBCUTANEOUS
  Filled 2016-06-22 (×2): qty 0.8

## 2016-06-22 MED ORDER — DEXTROSE 5 % IV SOLN
1.0000 g | Freq: Once | INTRAVENOUS | Status: AC
  Administered 2016-06-22: 1 g via INTRAVENOUS
  Filled 2016-06-22: qty 10

## 2016-06-22 MED ORDER — ENOXAPARIN SODIUM 40 MG/0.4ML ~~LOC~~ SOLN: 40.0000 mg | Freq: Once | SUBCUTANEOUS | Status: AC

## 2016-06-22 MED ORDER — DIPHENHYDRAMINE HCL 50 MG/ML IJ SOLN: 25.0000 mg | Freq: Four times a day (QID) | INTRAMUSCULAR | Status: DC | PRN

## 2016-06-22 NOTE — Progress Notes (Signed)
ANTICOAGULATION CONSULT NOTE - Initial Consult  Pharmacy Consult for lovenox Indication: pulmonary embolus  Allergies  Allergen Reactions  . Penicillins Hives    Has patient had a PCN reaction causing immediate rash, facial/tongue/throat swelling, SOB or lightheadedness with hypotension: Yes Has patient had a PCN reaction causing severe rash involving mucus membranes or skin necrosis: No Has patient had a PCN reaction that required hospitalization No Has patient had a PCN reaction occurring within the last 10 years: No If all of the above answers are "NO", then may proceed with Cephalosporin use.   Marland Kitchen Penicillin G Other (See Comments)    Patient Measurements: Height: 5\' 10"  (177.8 cm) Weight: 170 lb 12.8 oz (77.5 kg) IBW/kg (Calculated) : 73   Vital Signs: Temp: 98.6 F (37 C) (11/02 0700) Temp Source: Oral (11/02 0700) BP: 104/63 (11/02 0700) Pulse Rate: 77 (11/02 0700)  Labs:  Recent Labs  06/19/16 1707 06/19/16 1803 06/20/16 0646 06/21/16 0646 06/22/16 0608  HGB 9.4*  --  9.1* 8.5* 8.9*  HCT 28.7*  --  28.0* 26.3* 27.8*  PLT 571*  --  482* 429* 460*  APTT  --  37*  --   --   --   LABPROT  --  14.5 14.7  --   --   INR  --  1.12 1.14  --   --   CREATININE 1.01  --  0.82 0.66  --     Estimated Creatinine Clearance: 115.3 mL/min (by C-G formula based on SCr of 0.66 mg/dL).   Medical History: Past Medical History:  Diagnosis Date  . Anxiety   . Depression   . Rectal cancer ([place]) 12/17/2015    Medications:  Prescriptions Prior to Admission  Medication Sig Dispense Refill Last Dose  . enoxaparin (LOVENOX) 80 MG/0.8ML injection Inject 0.8 mLs (80 mg total) into the skin every 12 (twelve) hours. 60 Syringe 1 06/19/2016 at 900a  . escitalopram (LEXAPRO) 10 MG tablet Take 1 tablet (10 mg total) by mouth daily. 30 tablet 1 06/19/2016 at Unknown time  . gabapentin (NEURONTIN) 100 MG capsule Take 100 mg by mouth daily as needed. For neuropathy/pain. *Prescribed one  capsule three times daily*  0 06/18/2016 at Unknown time  . Heparin Lock Flush (HEPARIN FLUSH, PORCINE,) 100 UNIT/ML injection Flush picc line with 2.51ml (250 units) of heparin lock flush on MWF weekly (Patient taking differently: every Monday, Wednesday, and Friday. Flush picc line with 2.39ml (250 units) of heparin lock flush on MWF weekly) 15 Syringe 0 06/19/2016 at Unknown time  . HYDROmorphone (DILAUDID) 2 MG tablet Take 2 mg by mouth 3 (three) times daily as needed.  0 unknown  . ibuprofen (ADVIL,MOTRIN) 400 MG tablet Take 400 mg by mouth 3 (three) times daily as needed for mild pain or moderate pain.   0 Past Week at Unknown time  . oxyCODONE (OXY IR/ROXICODONE) 5 MG immediate release tablet Take 1 tablet (5 mg total) by mouth every 6 (six) hours as needed for severe pain (take 1-2 tabs po every 6 hrs as needed). (Patient taking differently: Take 5-10 mg by mouth every 6 (six) hours as needed for severe pain (take 1-2 tabs po every 6 hrs as needed). ) 30 tablet 0 06/19/2016 at 1200  . Sodium Chloride Flush (NORMAL SALINE FLUSH) 0.9 % SOLN Flush picc line with 10 ml of normal saline on MWF weekly. (Patient taking differently: every Monday, Wednesday, and Friday. Flush picc line with 10 ml of normal saline on MWF weekly.)  20 Syringe 0 06/19/2016 at Unknown time  . ALPRAZolam (XANAX) 0.5 MG tablet Take 1 tablet (0.5 mg total) by mouth 2 (two) times daily as needed for anxiety. (Patient not taking: Reported on 06/19/2016) 30 tablet 1 Not Taking at Unknown time  . clonazePAM (KLONOPIN) 0.5 MG tablet Take 1 tablet (0.5 mg total) by mouth at bedtime. May take 2 tablet if needed (Patient not taking: Reported on 06/19/2016) 60 tablet 0 Not Taking at Unknown time  . dextrose 5 % SOLN 1,000 mL with fluorouracil 5 GM/100ML SOLN Inject into the vein. To infuse over 46 hours every 14 days. To start May 8   Taking  . OXALIPLATIN IV Inject into the vein. Every 14 days x 6 cycles. Starting May 8.   Taking  .  prochlorperazine (COMPAZINE) 10 MG tablet Take 1 tablet (10 mg total) by mouth every 6 (six) hours as needed for nausea or vomiting. (Patient not taking: Reported on 05/31/2016) 30 tablet 2 Not Taking  . traMADol (ULTRAM) 50 MG tablet Take 1 tablet (50 mg total) by mouth every 6 (six) hours as needed. Reported on 01/04/2016 (Patient not taking: Reported on 05/31/2016) 45 tablet 0 Not Taking    Assessment: 50 yo man to resume treatment dose lovenox for h/o PE.  He received lovenox 40 mg sq earlier this am. Goal of Therapy:  Anti-Xa level 0.6-1 units/ml 4hrs after LMWH dose given Monitor platelets by anticoagulation protocol: Yes   Plan:  Lovenox 40 mg sq x 1 now then continue 80 mg sq q12 hours Monitor for bleeding complications  Jonathan Wright 06/22/2016,8:23 AM

## 2016-06-22 NOTE — Progress Notes (Signed)
  Subjective: Patient feels fine.  Tolerating po well.  Rectal drainage decreased.  Objective: Vital signs in last 24 hours: Temp:  [98.4 F (36.9 C)-98.6 F (37 C)] 98.6 F (37 C) (11/02 0700) Pulse Rate:  [77] 77 (11/02 0700) Resp:  [16-18] 16 (11/02 0700) BP: (101-104)/(63-65) 104/63 (11/02 0700) SpO2:  [99 %-100 %] 99 % (11/02 0700) Weight:  [77.5 kg (170 lb 12.8 oz)] 77.5 kg (170 lb 12.8 oz) (11/02 0700) Last BM Date: 06/22/16  Intake/Output from previous day: 11/01 0701 - 11/02 0700 In: 720 [P.O.:720] Out: -  Intake/Output this shift: No intake/output data recorded.  General appearance: alert, cooperative and no distress GI: soft, flat, nontender.  Ostomy patent.  Lab Results:   Recent Labs  06/21/16 0646 06/22/16 0608  WBC 7.5 5.3  HGB 8.5* 8.9*  HCT 26.3* 27.8*  PLT 429* 460*   BMET  Recent Labs  06/20/16 0646 06/21/16 0646  NA 137 136  K 3.9 3.5  CL 105 104  CO2 25 27  GLUCOSE 128* 117*  BUN 7 <5*  CREATININE 0.82 0.66  CALCIUM 8.3* 8.1*   PT/INR  Recent Labs  06/19/16 1803 06/20/16 0646  LABPROT 14.5 14.7  INR 1.12 1.14    Studies/Results: No results found.  Anti-infectives: Anti-infectives    Start     Dose/Rate Route Frequency Ordered Stop   06/20/16 0800  vancomycin (VANCOCIN) IVPB 1000 mg/200 mL premix     1,000 mg 200 mL/hr over 60 Minutes Intravenous Every 12 hours 06/19/16 2008     06/19/16 2200  meropenem (MERREM) 1 g in sodium chloride 0.9 % 100 mL IVPB     1 g 200 mL/hr over 30 Minutes Intravenous Every 8 hours 06/19/16 2003     06/19/16 2115  vancomycin (VANCOCIN) 1,500 mg in sodium chloride 0.9 % 500 mL IVPB  Status:  Discontinued     1,500 mg 250 mL/hr over 120 Minutes Intravenous  Once 06/19/16 2107 06/19/16 2112   06/19/16 2015  vancomycin (VANCOCIN) 1,500 mg in sodium chloride 0.9 % 500 mL IVPB  Status:  Discontinued     1,500 mg 250 mL/hr over 120 Minutes Intravenous  Once 06/19/16 2004 06/19/16 2250       Assessment/Plan: Imp:  Doing well, awaiting sensitivities.  No need for repeat Ct scan as patient has responded well to antibiotics.  May resume preop lovenox injections.  OK for discharge from surgical standpoint once sensitivities known.  Can follow up with me or Dr. Whitney Muse as needed.  LOS: 3 days    Jonathan Wright A 06/22/2016

## 2016-06-22 NOTE — Progress Notes (Signed)
PROGRESS NOTE                                                                                                                                                                                                             Patient Demographics:    Jonathan Wright, is a 50 y.o. male, DOB - 02-06-1966, [id number]  Admit date - 06/19/2016   Admitting Physician Vianne Bulls, MD  Outpatient Primary MD for the patient is [name], MD  LOS - 3  Chief Complaint  Patient presents with  . Fever       Brief Narrative   Jonathan Wright is a 50 y.o. male with medical history significant for depression, anxiety, and rectal cancer status post chemotherapy, radiation, and resection on 06/07/2016, now presented to the emergency department with fevers, lower abdominal pain, rectal bleeding and dysuria. Patient had a resection for his rectal cancer performed at [facility] on 06/07/2016 and reports feeling well upon hospital discharge. He notes that he was walking 4-5 miles a day shortly after the surgery, but over the past 6 days, has felt increasingly ill with fevers to 102 F, lower abdominal pain, and dysuria.  Further workup suggested bacteremia and sepsis along with possible perirectal abscess. Surgery following abscess draining by itself.   Subjective:    Jonathan Wright today has, No headache, No chest pain, No abdominal pain - No Nausea, No new weakness tingling or numbness, No Cough - SOB. Mild rectal pressure.    Assessment  & Plan :     1.Sepsis due to multiple gram-negative active uremia. Likely source perirectal infection post rectal surgery at [facility] few weeks ago, general surgery following, perirectal abscess draining by itself, white count and temperatures improved. For now continue empiric antibiotics until culture sensitivity have finalized. PICC line removed on 06/21/2016, vancomycin stopped on  06/22/2016, give him a test dose of Rocephin. Continue Meropenam for now.  2. 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 Currently under the care of Dr. Whitney Muse, plan is to follow with her post discharge.  3. History of PE diagnosed 04/13/2016.  Switched to full dose Lovenox on 06/22/2016, will monitor H&H.  4. UTI. For now continue Meropenem/Rocephin and follow cultures.  5. Anxiety and depression. Continue Lexapro.  6. Hyponatremia due to dehydration. Resolved with IV fluids.  7. Acute rectal bleeding related anemia. Baseline hemoglobin around 14, bleeding seems to have resolved, H&H now stable did not require transfusion, place on oral hent and folic acid for a month, monitor H&H on full dose Lovenox for 24 hours.    Family Communication  :  None  Code Status :  Full  Diet : Heart Healthy  Disposition Plan  :  Home 3-4 days  Consults  :  Surgery  Procedures  :      DVT Prophylaxis  :  Lovenox     Lab Results  Component Value Date   PLT 460 (H) 06/22/2016    Inpatient Medications  Scheduled Meds: . cefTRIAXone (ROCEPHIN)  IV  1 g Intravenous Once  . enoxaparin (LOVENOX) injection  80 mg Subcutaneous Q12H  . escitalopram  10 mg Oral Daily  . meropenem (MERREM) IV  1 g Intravenous Q8H  . sodium chloride flush  3 mL Intravenous Q12H   Continuous Infusions:  PRN Meds:.acetaminophen **OR** [DISCONTINUED] acetaminophen, diphenhydrAMINE, ibuprofen, [DISCONTINUED] ondansetron **OR** ondansetron (ZOFRAN) IV  Antibiotics  :    Anti-infectives    Start     Dose/Rate Route Frequency Ordered Stop   06/22/16 0900  cefTRIAXone (ROCEPHIN) 1 g in dextrose 5 % 50 mL IVPB     1 g 100 mL/hr over 30 Minutes Intravenous   Once 06/22/16 0812     06/20/16 0800  vancomycin (VANCOCIN) IVPB 1000 mg/200 mL premix  Status:  Discontinued     1,000 mg 200 mL/hr over 60 Minutes Intravenous Every 12 hours 06/19/16 2008 06/22/16 0812   06/19/16 2200  meropenem (MERREM) 1 g in sodium chloride 0.9 % 100 mL IVPB     1 g 200 mL/hr over 30 Minutes Intravenous Every 8 hours 06/19/16 2003     06/19/16 2115  vancomycin (VANCOCIN) 1,500 mg in sodium chloride 0.9 % 500 mL IVPB  Status:  Discontinued     1,500 mg 250 mL/hr over 120 Minutes Intravenous  Once 06/19/16 2107 06/19/16 2112   06/19/16 2015  vancomycin (VANCOCIN) 1,500 mg in sodium chloride 0.9 % 500 mL IVPB  Status:  Discontinued     1,500 mg 250 mL/hr over 120 Minutes Intravenous  Once 06/19/16 2004 06/19/16 2250         Objective:   Vitals:   06/21/16 0504 06/21/16 1959 06/21/16 2044 06/22/16 0700  BP: (!) 104/59 101/65  104/63  Pulse: 86 77  77  Resp: 18 18  16   Temp: 99.3 F (37.4 C) 98.4 F (36.9 C)  98.6 F (37 C)  TempSrc: Oral Other (Comment)  Oral  SpO2: 99% 100% 99% 99%  Weight: 76.2 kg (167 lb 15.9 oz)   77.5 kg (170 lb 12.8 oz)  Height:        Wt Readings from Last 3 Encounters:  06/22/16 77.5 kg (170 lb 12.8 oz)  05/31/16 79.8 kg (176 lb)  03/14/16 77.7 kg (171 lb 6.4 oz)     Intake/Output Summary (Last 24 hours) at 06/22/16 0904 Last data filed at 06/21/16 1730  Gross per 24 hour  Intake              480 ml  Output  0 ml  Net              480 ml     Physical Exam  Awake Alert, Oriented X 3, No new F.N deficits, Normal affect [place].AT,PERRAL Supple Neck,No JVD, No cervical lymphadenopathy appriciated.  Symmetrical Chest wall movement, Good air movement bilaterally, CTAB RRR,No Gallops,Rubs or new Murmurs, No Parasternal Heave +ve B.Sounds, Abd Soft, No tenderness, No organomegaly appriciated, No rebound - guarding or rigidity. No Cyanosis, Clubbing or edema, No new Rash or bruise       Data Review:     CBC  Recent Labs Lab 06/19/16 1707 06/20/16 0646 06/21/16 0646 06/22/16 0608  WBC 17.5* 17.4* 7.5 5.3  HGB 9.4* 9.1* 8.5* 8.9*  HCT 28.7* 28.0* 26.3* 27.8*  PLT 571* 482* 429* 460*  MCV 97.3 96.9 97.0 97.2  MCH 31.9 31.5 31.4 31.1  MCHC 32.8 32.5 32.3 32.0  RDW 13.6 13.7 14.1 14.0  LYMPHSABS 0.8 0.6*  --   --   MONOABS 1.9* 1.0  --   --   EOSABS 0.1 0.0  --   --   BASOSABS 0.1 0.0  --   --     Chemistries   Recent Labs Lab 06/19/16 1707 06/20/16 0646 06/21/16 0646  NA 133* 137 136  K 3.8 3.9 3.5  CL 100* 105 104  CO2 25 25 27   GLUCOSE 102* 128* 117*  BUN 8 7 <5*  CREATININE 1.01 0.82 0.66  CALCIUM 8.4* 8.3* 8.1*  MG  --   --  2.0  AST 21 26  --   ALT 46 45  --   ALKPHOS 154* 175*  --   BILITOT 0.5 0.6  --    ------------------------------------------------------------------------------------------------------------------ No results for input(s): CHOL, HDL, LDLCALC, TRIG, CHOLHDL, LDLDIRECT in the last 72 hours.  No results found for: HGBA1C ------------------------------------------------------------------------------------------------------------------ No results for input(s): TSH, T4TOTAL, T3FREE, THYROIDAB in the last 72 hours.  Invalid input(s): FREET3 ------------------------------------------------------------------------------------------------------------------ No results for input(s): VITAMINB12, FOLATE, FERRITIN, TIBC, IRON, RETICCTPCT in the last 72 hours.  Coagulation profile  Recent Labs Lab 06/19/16 1803 06/20/16 0646  INR 1.12 1.14    No results for input(s): DDIMER in the last 72 hours.  Cardiac Enzymes No results for input(s): CKMB, TROPONINI, MYOGLOBIN in the last 168 hours.  Invalid input(s): CK ------------------------------------------------------------------------------------------------------------------ No results found for: BNP  Micro Results Recent Results (from the past 240 hour(s))  Culture, blood (routine x  2)     Status: None (Preliminary result)   Collection Time: 06/19/16  5:07 PM  Result Value Ref Range Status   Specimen Description BLOOD PIC LINE  Final   Special Requests BOTTLES DRAWN AEROBIC AND ANAEROBIC 6CC  Final   Culture  Setup Time   Final    GRAM VARIABLE ROD ANA AND AEB Gram Stain Report Called to,Read Back By and Verified With: TETREAULT H AT 0450 ON [id number] BY FORSYTH K CRITICAL RESULT CALLED TO, READ BACK BY AND VERIFIED WITHShellee Milo, PHARM AT [place] ON [id number] BY Rhea Bleacher Performed at [place]  Final   Report Status PENDING  Incomplete  Blood Culture ID Panel (Reflexed)     Status: Abnormal   Collection Time: 06/19/16  5:07 PM  Result Value Ref Range Status   Enterococcus species NOT DETECTED NOT DETECTED Final   Listeria monocytogenes NOT DETECTED NOT DETECTED Final   Staphylococcus species NOT DETECTED NOT DETECTED Final   Staphylococcus aureus NOT  DETECTED NOT DETECTED Final   Streptococcus species NOT DETECTED NOT DETECTED Final   Streptococcus agalactiae NOT DETECTED NOT DETECTED Final   Streptococcus pneumoniae NOT DETECTED NOT DETECTED Final   Streptococcus pyogenes NOT DETECTED NOT DETECTED Final   Acinetobacter baumannii NOT DETECTED NOT DETECTED Final   Enterobacteriaceae species DETECTED (A) NOT DETECTED Final    Comment: CRITICAL RESULT CALLED TO, READ BACK BY AND VERIFIED WITH: L. SEAY, PHARM AT 0917 ON [id number] BY S. YARBROUGH    Enterobacter cloacae complex NOT DETECTED NOT DETECTED Final   Escherichia coli DETECTED (A) NOT DETECTED Final    Comment: CRITICAL RESULT CALLED TO, READ BACK BY AND VERIFIED WITH: L. SEAY, PHARM AT 0917 ON [id number] BY S. YARBROUGH    Klebsiella oxytoca NOT DETECTED NOT DETECTED Final   Klebsiella pneumoniae DETECTED (A) NOT DETECTED Final    Comment: CRITICAL RESULT CALLED TO, READ BACK BY AND VERIFIED WITH: L. SEAY,PHARM AT 0917 ON [id number] BY S. YARBROUGH    Proteus species NOT  DETECTED NOT DETECTED Final   Serratia marcescens NOT DETECTED NOT DETECTED Final   Carbapenem resistance NOT DETECTED NOT DETECTED Final   Haemophilus influenzae NOT DETECTED NOT DETECTED Final   Neisseria meningitidis NOT DETECTED NOT DETECTED Final   Pseudomonas aeruginosa NOT DETECTED NOT DETECTED Final   Candida albicans NOT DETECTED NOT DETECTED Final   Candida glabrata NOT DETECTED NOT DETECTED Final   Candida krusei NOT DETECTED NOT DETECTED Final   Candida parapsilosis NOT DETECTED NOT DETECTED Final   Candida tropicalis NOT DETECTED NOT DETECTED Final    Comment: Performed at [facility]  Culture, blood (routine x 2)     Status: None (Preliminary result)   Collection Time: 06/19/16  5:45 PM  Result Value Ref Range Status   Specimen Description BLOOD PIC LINE  Final   Special Requests BOTTLES DRAWN AEROBIC AND ANAEROBIC 6CC  Final   Culture  Setup Time   Final    GRAM VARIABLE ROD Gram Stain Report Called to,Read Back By and Verified With: [place] AT [id number] ON [id number] BY FORSYTH K IN BOTH AEROBIC AND ANAEROBIC BOTTLES CRITICAL RESULT CALLED TO, READ BACK BY AND VERIFIED WITHShellee Milo, PHARM AT [place] ON [id number] BY Rhea Bleacher Performed at [place]  Final   Report Status PENDING  Incomplete  Urine culture     Status: None   Collection Time: 06/19/16  6:09 PM  Result Value Ref Range Status   Specimen Description URINE, RANDOM  Final   Special Requests NONE  Final   Culture NO GROWTH Performed at [facility]   Final   Report Status 06/21/2016 FINAL  Final    Radiology Reports Dg Chest 2 View  Result Date: 06/19/2016 CLINICAL DATA:  Fever since Wednesday. Rectal bleeding. Rectal cancer. Recent surgery. EXAM: CHEST  2 VIEW COMPARISON:  None. FINDINGS: Right-sided PICC line terminates at the low SVC. Midline trachea. Normal heart size and mediastinal contours. No pleural effusion or pneumothorax. Subsegmental  atelectasis involves the left lung base laterally. IMPRESSION: No acute process or explanation for fever. Electronically Signed   By: Abigail Miyamoto M.D.   On: 06/19/2016 18:06   Ct Abdomen Pelvis W Contrast  Result Date: 06/19/2016 CLINICAL DATA:  Rectal bleeding today. Diagnosis of rectal cancer April 2017. Colectomy June 07 2016 at [facility], fever for the past 6 days. EXAM: CT ABDOMEN AND PELVIS WITH CONTRAST TECHNIQUE: Multidetector CT imaging  of the abdomen and pelvis was performed using the standard protocol following bolus administration of intravenous contrast. CONTRAST:  122mL ISOVUE-300 IOPAMIDOL (ISOVUE-300) INJECTION 61% COMPARISON:  None. FINDINGS: Lower chest: Mild scarring or subsegmental atelectasis in both lower lobes. Hepatobiliary: Unremarkable Pancreas: Unremarkable Spleen: Unremarkable Adrenals/Urinary Tract: Posterior wall thickening in the urinary bladder but otherwise normal appearance along the urothelium and of the kidneys. No hydronephrosis. Adrenal glands normal. Stomach/Bowel: Right lower quadrant ileostomy. Are port the patient had a proper colectomy, but on today's exam there seems to be a decompressed colon following its expected course, with components showing apparent Haas tray shin for example in the transverse colon on image 19/4. There is likely been removal of the rectum, with a potential pole down of the sigmoid colon into a slightly protruding region of the perineum. There is fluid, gas, and some high density in the perirectal space through which this loop of colon extends, with the collection of gas and fluid measuring approximately 340 cc when I subtract out the volume of the bowel and surrounding mesentery coursing through this collection. Scattered locules of gas in the collection are shown on images 68 through 84/2. At 12 days postoperative these gas collections are abnormal and probably reflect early abscess formation. There is also some enhancement along the  margins of this presacral/ perirectal collection. There is only some minimal stranding in the ischiorectal fossa bilaterally. No dilated bowel identified. Vascular/Lymphatic: Retroaortic left renal vein, no significant atherosclerotic calcification identified. No pathologic adenopathy noted. Reproductive: There is some indistinctness of tissue planes along the seminal vesicles and posterolateral prostate gland, probably postoperative. A component could be due to radiation therapy. Other: Laparoscopy ports noted. Small amount of gas in the subcutaneous tissues the left anterior abdominal wall, image 44/2. Musculoskeletal: Loss of disc height at L5-S1 with vacuum disc phenomenon. In conjunction with disc bulges and facet arthropathy there is mild bilateral foraminal impingement at the L5-S1 level. IMPRESSION: 1. By report the patient has had abdominoperineal proctocolectomy and diverting ileostomy. I do see the ileostomy, but the patient still appears to have most of the colon. The rectum is thought to have been removed and the sigmoid colon pulled through towards the anus. Please correlate with the original operative note to explain this appearance. In any case, there is a perirectal collection of complex fluid and scattered gas density with some enhancement along the margins, volume estimated at 340 cc. Twelve days postoperative is a little bit too late to still have gas in a postoperative collection, appearance suspicious for abscess of the perirectal space. 2. No complicating feature of the ileostomy is identified. 3. There is some posterior wall thickening in the urinary bladder along with indistinctness of tissue planes along the seminal vesicles and prostate gland. Much of this could be postoperative although radiation therapy can also cause thickening of the structures. 4. Mild impingement at L5-S1 due to spondylosis and degenerative disc disease. Electronically Signed   By: Van Clines M.D.   On:  06/19/2016 19:31    Time Spent in minutes  30   SINGH,PRASHANT K M.D on 06/22/2016 at 9:04 AM  Between 7am to 7pm - Pager - [phone]  After 7pm go to [email or web address] - password [facility]  [facility] -  Office  [phone]

## 2016-06-23 LAB — CULTURE, BLOOD (ROUTINE X 2)

## 2016-06-23 LAB — GLUCOSE, CAPILLARY: GLUCOSE-CAPILLARY: 96 mg/dL (ref 65–99)

## 2016-06-23 MED ORDER — CEFPODOXIME PROXETIL 200 MG PO TABS: 200.0000 mg | ORAL_TABLET | Freq: Two times a day (BID) | ORAL | 0 refills | Status: DC

## 2016-06-23 MED ORDER — FERROUS SULFATE 325 (65 FE) MG PO TABS: 325.0000 mg | ORAL_TABLET | Freq: Two times a day (BID) | ORAL | 0 refills | Status: DC

## 2016-06-23 MED ORDER — FOLIC ACID 1 MG PO TABS: 1.0000 mg | ORAL_TABLET | Freq: Every day | ORAL | 0 refills | Status: DC

## 2016-06-23 NOTE — Discharge Summary (Signed)
Jace Freier [id number] DOB: Nov 20, 1965 DOA: 06/19/2016  PCP: [name], MD  Admit date: 06/19/2016  Discharge date: 06/23/2016  Admitted From: Home   Disposition:  Home   Recommendations for Outpatient Follow-up:   Follow up with PCP in 1-2 weeks  PCP Please obtain BMP/CBC, 2 view CXR in 1week,  (see Discharge instructions)   PCP Please follow up on the following pending results: Follow final blood culture and sensitivity results   Home Health: None  Equipment/Devices: None  Consultations: Surgery Discharge Condition: Fair   CODE STATUS: Full   Diet Recommendation: Heart Healthy    Chief Complaint  Patient presents with  . Fever     Brief history of present illness from the day of admission and additional interim summary      Burnham Sein a 50 y.o.malewith medical history significant fordepression, anxiety, and rectal cancer status post chemotherapy, radiation, and resection on 06/07/2016, now presented to the emergency department with fevers, lower abdominal pain, rectal bleeding and dysuria. Patient had a resection for his rectal cancer performed at [facility] on 06/07/2016 and reports feeling well upon hospital discharge. He notes that he was walking 4-5 miles a day shortly after the surgery, but over the past 6 days, has felt increasingly ill with fevers to 102 F, lower abdominal pain, and dysuria.  Further workup suggested bacteremia and sepsis along with possible perirectal abscess. Surgery was on board, abscess draining by itself.  Hospital issues addressed     1.Sepsis due to multiple gram-negative active uremia. Likely source perirectal infection post rectal surgery at [facility] few weeks ago, general surgery following, perirectal abscess draining by itself,  white count and temperatures improved. He had a PICC line from prior to this admission which was line removed on 06/21/2016, vancomycin stopped on 06/22/2016, He tolerated test dose of Rocephin well on 06/22/2016 without any issues, will be transitioned to [facility] for 10 more days based on partial sensitivity data, requested to follow with PCP in 3 days to make sure he is tolerating oral medications well and has continued to defervesce, request PCP to please check final culture and sensitivity results which should be back in 3 days. He will follow with general surgery outpatient as well.  2. 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. Currently under the care of Dr. Whitney Muse, plan is to follow with her post discharge.  3. History of PE diagnosed 04/13/2016. Continue home dose Lovenox has tolerated full dose Lovenox when on 06/22/2016 without any evidence of worsening bleeding.  4. ? UTI. Ruled out.  5. Anxiety and depression. Continue Lexapro.  6. Hyponatremia due to dehydration. Resolved with IV fluids.  7. Acute rectal bleeding related anemia. Baseline hemoglobin around 14, bleeding seems to have resolved, H&H now stable did not require  transfusion, placed on oral hent and folic acid for a month, monitor H&H by PCP on full dose Lovenox for 24 hours.    Discharge diagnosis     Principal Problem:   Sepsis ([place]) Active Problems:   Rectal cancer (HCC)   S/P PICC central line placement   UTI (urinary tract infection)   Perirectal abscess   Normocytic anemia   Hyponatremia   Depression with anxiety   Rectal bleeding   Fever of unknown origin (FUO)   Sepsis, unspecified organism [facility])    Discharge instructions     Discharge Instructions    Diet - low sodium heart healthy    Complete by:  As directed    Discharge instructions    Complete by:  As directed    Follow with Primary MD [name], MD in 3 days , follow up on final blood culture results which are still pending  Get CBC, CMP, 2 view Chest X ray checked  by Primary MD in 3days   Activity: As tolerated with Full fall precautions use walker/cane & assistance as needed  Disposition Home    Diet:   Heart Healthy    For Heart failure patients - Check your Weight same time everyday, if you gain over 2 pounds, or you develop in leg swelling, experience more shortness of breath or chest pain, call your Primary MD immediately. Follow Cardiac Low Salt Diet and 1.5 lit/day fluid restriction.   On your next visit with your primary care physician please Get Medicines reviewed and adjusted.   Please request your Prim.MD to go over all Hospital Tests and Procedure/Radiological results at the follow up, please get all Hospital records sent to your Prim MD by signing hospital release before you go home.   If you experience worsening of your admission symptoms, develop shortness of breath, life threatening emergency, suicidal or homicidal thoughts you must seek medical attention immediately by calling 911 or calling your MD immediately  if symptoms less severe.  You Must read complete instructions/literature along with all the possible adverse reactions/side effects for all the Medicines you take and that have been prescribed to you. Take any new Medicines after you have completely understood and accpet all the possible adverse reactions/side effects.   Do not drive, operate heavy machinery, perform activities at heights, swimming or participation in water activities or provide baby sitting services if your were admitted for syncope or siezures until you have seen by Primary MD or a Neurologist and advised to do so again.  Do not drive when taking  Pain medications.    Do not take more than prescribed Pain, Sleep and Anxiety Medications  Special Instructions: If you have smoked or chewed Tobacco  in the last 2 yrs please stop smoking, stop any regular Alcohol  and or any Recreational drug use.  Wear Seat belts while driving.   Please note  You were cared for by a hospitalist during your hospital stay. If you have any questions about your discharge medications or the care you received while you were in the hospital after you are discharged, you can call the unit and asked to speak with the hospitalist on call if the hospitalist that took care of you is not available. Once you are discharged, your primary care physician will handle any further medical issues. Please note that NO REFILLS for any discharge medications will be authorized once you are discharged, as it is imperative that you return to your primary care physician (or establish  a relationship with a primary care physician if you do not have one) for your aftercare needs so that they can reassess your need for medications and monitor your lab values.   Increase activity slowly    Complete by:  As directed       Discharge Medications     Medication List    TAKE these medications   ALPRAZolam 0.5 MG tablet Commonly known as:  XANAX Take 1 tablet (0.5 mg total) by mouth 2 (two) times daily as needed for anxiety.   cefpodoxime 200 MG tablet Commonly known as:  VANTIN Take 1 tablet (200 mg total) by mouth 2 (two) times daily.   clonazePAM 0.5 MG tablet Commonly known as:  KLONOPIN Take 1 tablet (0.5 mg total) by mouth at bedtime. May take 2 tablet if needed   dextrose 5 % SOLN 1,000 mL with fluorouracil 5 GM/100ML SOLN Inject into the vein. To infuse over 46 hours every 14 days. To start May 8   enoxaparin 80 MG/0.8ML injection Commonly known as:  LOVENOX Inject 0.8 mLs (80 mg total) into the skin every 12 (twelve) hours.   escitalopram 10 MG tablet Commonly known  as:  LEXAPRO Take 1 tablet (10 mg total) by mouth daily.   gabapentin 100 MG capsule Commonly known as:  NEURONTIN Take 100 mg by mouth daily as needed. For neuropathy/pain. *Prescribed one capsule three times daily*   heparin flush (porcine) 100 UNIT/ML injection Flush picc line with 2.21ml (250 units) of heparin lock flush on MWF weekly What changed:  when to take this  additional instructions   HYDROmorphone 2 MG tablet Commonly known as:  DILAUDID Take 2 mg by mouth 3 (three) times daily as needed.   ibuprofen 400 MG tablet Commonly known as:  ADVIL,MOTRIN Take 400 mg by mouth 3 (three) times daily as needed for mild pain or moderate pain.   Normal Saline Flush 0.9 % Soln Flush picc line with 10 ml of normal saline on MWF weekly. What changed:  when to take this  additional instructions   OXALIPLATIN IV Inject into the vein. Every 14 days x 6 cycles. Starting May 8.   oxyCODONE 5 MG immediate release tablet Commonly known as:  Oxy IR/ROXICODONE Take 1 tablet (5 mg total) by mouth every 6 (six) hours as needed for severe pain (take 1-2 tabs po every 6 hrs as needed). What changed:  how much to take   prochlorperazine 10 MG tablet Commonly known as:  COMPAZINE Take 1 tablet (10 mg total) by mouth every 6 (six) hours as needed for nausea or vomiting.   traMADol 50 MG tablet Commonly known as:  ULTRAM Take 1 tablet (50 mg total) by mouth every 6 (six) hours as needed. Reported on 01/04/2016       Follow-up Information    [name], MD. Schedule an appointment as soon as possible for a visit in 3 day(s).   Specialty:  Family Medicine Contact information: [place] [address] [phone]        Jamesetta So, MD. Schedule an appointment as soon as possible for a visit in 1 week(s).   Specialty:  General Surgery Contact information: [id number] [phone]           Major  procedures and Radiology Reports - PLEASE review detailed and final reports thoroughly  -       Dg Chest 2 View  Result Date: 06/19/2016 CLINICAL DATA:  Fever since Wednesday. Rectal bleeding. Rectal cancer. Recent surgery. EXAM: CHEST  2 VIEW COMPARISON:  None. FINDINGS: Right-sided PICC line terminates at the low SVC. Midline trachea. Normal heart size and mediastinal contours. No pleural effusion or pneumothorax. Subsegmental atelectasis involves the left lung base laterally. IMPRESSION: No acute process or explanation for fever. Electronically Signed   By: Abigail Miyamoto M.D.   On: 06/19/2016 18:06   Ct Abdomen Pelvis W Contrast  Result Date: 06/19/2016 CLINICAL DATA:  Rectal bleeding today. Diagnosis of rectal cancer April 2017. Colectomy June 07 2016 at [facility], fever for the past 6 days. EXAM: CT ABDOMEN AND PELVIS WITH CONTRAST TECHNIQUE: Multidetector CT imaging of the abdomen and pelvis was performed using the standard protocol following bolus administration of intravenous contrast. CONTRAST:  152mL ISOVUE-300 IOPAMIDOL (ISOVUE-300) INJECTION 61% COMPARISON:  None. FINDINGS: Lower chest: Mild scarring or subsegmental atelectasis in both lower lobes. Hepatobiliary: Unremarkable Pancreas: Unremarkable Spleen: Unremarkable Adrenals/Urinary Tract: Posterior wall thickening in the urinary bladder but otherwise normal appearance along the urothelium and of the kidneys. No hydronephrosis. Adrenal glands normal. Stomach/Bowel: Right lower quadrant ileostomy. Are port the patient had a proper colectomy, but on today's exam there seems to be a decompressed colon following its expected course, with components showing apparent Haas tray shin for example in the transverse colon on image 19/4. There is likely been removal of the rectum, with a potential pole down of the sigmoid colon into a slightly protruding region of the perineum. There is fluid, gas, and some high density in the perirectal space  through which this loop of colon extends, with the collection of gas and fluid measuring approximately 340 cc when I subtract out the volume of the bowel and surrounding mesentery coursing through this collection. Scattered locules of gas in the collection are shown on images 68 through 84/2. At 12 days postoperative these gas collections are abnormal and probably reflect early abscess formation. There is also some enhancement along the margins of this presacral/ perirectal collection. There is only some minimal stranding in the ischiorectal fossa bilaterally. No dilated bowel identified. Vascular/Lymphatic: Retroaortic left renal vein, no significant atherosclerotic calcification identified. No pathologic adenopathy noted. Reproductive: There is some indistinctness of tissue planes along the seminal vesicles and posterolateral prostate gland, probably postoperative. A component could be due to radiation therapy. Other: Laparoscopy ports noted. Small amount of gas in the subcutaneous tissues the left anterior abdominal wall, image 44/2. Musculoskeletal: Loss of disc height at L5-S1 with vacuum disc phenomenon. In conjunction with disc bulges and facet arthropathy there is mild bilateral foraminal impingement at the L5-S1 level. IMPRESSION: 1. By report the patient has had abdominoperineal proctocolectomy and diverting ileostomy. I do see the ileostomy, but the patient still appears to have most of the colon. The rectum is thought to have been removed and the sigmoid colon pulled through towards the anus. Please correlate with the original operative note to explain this appearance. In any case, there is a perirectal collection of complex fluid and scattered gas density with some enhancement along the margins, volume estimated at 340 cc. Twelve days postoperative is a little bit too late to still have gas in a postoperative collection, appearance suspicious for abscess of the perirectal space. 2. No complicating  feature of the ileostomy is identified. 3. There is some posterior wall thickening in the urinary bladder along with indistinctness of tissue planes along the seminal vesicles and prostate gland. Much of this could be postoperative although radiation therapy can  also cause thickening of the structures. 4. Mild impingement at L5-S1 due to spondylosis and degenerative disc disease. Electronically Signed   By: Van Clines M.D.   On: 06/19/2016 19:31    Micro Results     Recent Results (from the past 240 hour(s))  Culture, blood (routine x 2)     Status: Abnormal (Preliminary result)   Collection Time: 06/19/16  5:07 PM  Result Value Ref Range Status   Specimen Description BLOOD PIC LINE  Final   Special Requests BOTTLES DRAWN AEROBIC AND ANAEROBIC 6CC  Final   Culture  Setup Time   Final    GRAM VARIABLE ROD ANA AND AEB Gram Stain Report Called to,Read Back By and Verified With: TETREAULT H AT 0450 ON [id number] BY FORSYTH K CRITICAL RESULT CALLED TO, READ BACK BY AND VERIFIED WITH: LLauralee Evener, PHARM AT 0917 ON [id number] BY Rhea Bleacher    Culture (A)  Final    KLEBSIELLA PNEUMONIAE ESCHERICHIA COLI SUSCEPTIBILITIES TO FOLLOW Performed at [facility]    Report Status PENDING  Incomplete   Organism ID, Bacteria KLEBSIELLA PNEUMONIAE  Final      Susceptibility   Klebsiella pneumoniae - MIC*    AMPICILLIN <=2 RESISTANT Resistant     CEFAZOLIN <=4 SENSITIVE Sensitive     CEFEPIME <=1 SENSITIVE Sensitive     CEFTAZIDIME <=1 SENSITIVE Sensitive     CEFTRIAXONE <=1 SENSITIVE Sensitive     CIPROFLOXACIN <=0.25 SENSITIVE Sensitive     GENTAMICIN <=1 SENSITIVE Sensitive     IMIPENEM <=0.25 SENSITIVE Sensitive     TRIMETH/SULFA <=20 SENSITIVE Sensitive     AMPICILLIN/SULBACTAM <=2 SENSITIVE Sensitive     PIP/TAZO <=4 SENSITIVE Sensitive     Extended ESBL NEGATIVE Sensitive     * KLEBSIELLA PNEUMONIAE  Blood Culture ID Panel (Reflexed)     Status: Abnormal   Collection Time: 06/19/16   5:07 PM  Result Value Ref Range Status   Enterococcus species NOT DETECTED NOT DETECTED Final   Listeria monocytogenes NOT DETECTED NOT DETECTED Final   Staphylococcus species NOT DETECTED NOT DETECTED Final   Staphylococcus aureus NOT DETECTED NOT DETECTED Final   Streptococcus species NOT DETECTED NOT DETECTED Final   Streptococcus agalactiae NOT DETECTED NOT DETECTED Final   Streptococcus pneumoniae NOT DETECTED NOT DETECTED Final   Streptococcus pyogenes NOT DETECTED NOT DETECTED Final   Acinetobacter baumannii NOT DETECTED NOT DETECTED Final   Enterobacteriaceae species DETECTED (A) NOT DETECTED Final    Comment: CRITICAL RESULT CALLED TO, READ BACK BY AND VERIFIED WITH: L. SEAY, PHARM AT 0917 ON [id number] BY S. YARBROUGH    Enterobacter cloacae complex NOT DETECTED NOT DETECTED Final   Escherichia coli DETECTED (A) NOT DETECTED Final    Comment: CRITICAL RESULT CALLED TO, READ BACK BY AND VERIFIED WITH: L. SEAY, PHARM AT 0917 ON [id number] BY S. YARBROUGH    Klebsiella oxytoca NOT DETECTED NOT DETECTED Final   Klebsiella pneumoniae DETECTED (A) NOT DETECTED Final    Comment: CRITICAL RESULT CALLED TO, READ BACK BY AND VERIFIED WITH: L. SEAY,PHARM AT [id number] ON [id number] BY S. YARBROUGH    Proteus species NOT DETECTED NOT DETECTED Final   Serratia marcescens NOT DETECTED NOT DETECTED Final   Carbapenem resistance NOT DETECTED NOT DETECTED Final   Haemophilus influenzae NOT DETECTED NOT DETECTED Final   Neisseria meningitidis NOT DETECTED NOT DETECTED Final   Pseudomonas aeruginosa NOT DETECTED NOT DETECTED Final   Candida albicans NOT DETECTED NOT DETECTED Final  Candida glabrata NOT DETECTED NOT DETECTED Final   Candida krusei NOT DETECTED NOT DETECTED Final   Candida parapsilosis NOT DETECTED NOT DETECTED Final   Candida tropicalis NOT DETECTED NOT DETECTED Final    Comment: Performed at [facility]  Culture, blood (routine x 2)     Status: Abnormal   Collection Time:  06/19/16  5:45 PM  Result Value Ref Range Status   Specimen Description BLOOD PIC LINE  Final   Special Requests BOTTLES DRAWN AEROBIC AND ANAEROBIC 6CC  Final   Culture  Setup Time   Final    GRAM VARIABLE ROD Gram Stain Report Called to,Read Back By and Verified With: TETREAULT H AT 0450 ON [id number] BY FORSYTH K IN BOTH AEROBIC AND ANAEROBIC BOTTLES CRITICAL RESULT CALLED TO, READ BACK BY AND VERIFIED WITH: L. SEAY, PHARM AT 0917 ON [id number] BY S. YARBROUGH    Culture (A)  Final    ESCHERICHIA COLI KLEBSIELLA PNEUMONIAE SUSCEPTIBILITIES PERFORMED ON PREVIOUS CULTURE WITHIN THE LAST 5 DAYS. Performed at [facility]    Report Status 06/23/2016 FINAL  Final  Urine culture     Status: None   Collection Time: 06/19/16  6:09 PM  Result Value Ref Range Status   Specimen Description URINE, RANDOM  Final   Special Requests NONE  Final   Culture NO GROWTH Performed at [facility]   Final   Report Status 06/21/2016 FINAL  Final  Culture, blood (routine x 2)     Status: None (Preliminary result)   Collection Time: 06/21/16  3:58 PM  Result Value Ref Range Status   Specimen Description BLOOD RIGHT ANTECUBITAL  Final   Special Requests BOTTLES DRAWN AEROBIC AND ANAEROBIC 10CC  Final   Culture NO GROWTH 2 DAYS  Final   Report Status PENDING  Incomplete  Culture, blood (routine x 2)     Status: None (Preliminary result)   Collection Time: 06/21/16  4:06 PM  Result Value Ref Range Status   Specimen Description BLOOD RIGHT HAND  Final   Special Requests   Final    BOTTLES DRAWN AEROBIC AND ANAEROBIC AEB=5CC ANA=4CC   Culture NO GROWTH 2 DAYS  Final   Report Status PENDING  Incomplete    Today   Subjective    Royetta Crochet today has no headache,no chest abdominal pain,no new weakness tingling or numbness, feels much better wants to go home today.     Objective   Blood pressure 121/71, pulse 76, temperature 98.4 F (36.9 C), temperature source Oral, resp. rate 15,  height 5\' 10"  (1.778 m), weight 77.2 kg (170 lb 1.6 oz), SpO2 98 %.   Intake/Output Summary (Last 24 hours) at 06/23/16 0925 Last data filed at 06/23/16 0900  Gross per 24 hour  Intake              940 ml  Output                0 ml  Net              940 ml    Exam Awake Alert, Oriented x 3, No new F.N deficits, Normal affect [place].AT,PERRAL Supple Neck,No JVD, No cervical lymphadenopathy appriciated.  Symmetrical Chest wall movement, Good air movement bilaterally, CTAB RRR,No Gallops,Rubs or new Murmurs, No Parasternal Heave +ve B.Sounds, Abd Soft, Non tender, No organomegaly appriciated, No rebound -guarding or rigidity. No Cyanosis, Clubbing or edema, No new Rash or bruise   Data Review   CBC  w Diff: Lab Results  Component Value Date   WBC 5.3 06/22/2016   HGB 8.9 (L) 06/22/2016   HCT 27.8 (L) 06/22/2016   PLT 460 (H) 06/22/2016   LYMPHOPCT 3 06/20/2016   MONOPCT 6 06/20/2016   EOSPCT 0 06/20/2016   BASOPCT 0 06/20/2016    CMP: Lab Results  Component Value Date   NA 136 06/21/2016   K 3.5 06/21/2016   CL 104 06/21/2016   CO2 27 06/21/2016   BUN <5 (L) 06/21/2016   CREATININE 0.66 06/21/2016   PROT 6.5 06/20/2016   ALBUMIN 2.7 (L) 06/20/2016   BILITOT 0.6 06/20/2016   ALKPHOS 175 (H) 06/20/2016   AST 26 06/20/2016   ALT 45 06/20/2016  .   Total Time in preparing paper work, data evaluation and todays exam - 35 minutes  Thurnell Lose M.D on 06/23/2016 at 9:25 AM  [facility]   Office  [phone]

## 2016-06-23 NOTE — Progress Notes (Signed)
Patient with orders to be discharge home. Discharge instructions given, patient verbalized understanding. Prescriptions given. Patient stable. Patient left in private vehicle with friend.  

## 2016-06-23 NOTE — Discharge Instructions (Signed)
Follow with Primary MD [name], MD in 3 days , follow up on final blood culture results which are still pending  Get CBC, CMP, 2 view Chest X ray checked  by Primary MD in 3days   Activity: As tolerated with Full fall precautions use walker/cane & assistance as needed  Disposition Home    Diet:   Heart Healthy    For Heart failure patients - Check your Weight same time everyday, if you gain over 2 pounds, or you develop in leg swelling, experience more shortness of breath or chest pain, call your Primary MD immediately. Follow Cardiac Low Salt Diet and 1.5 lit/day fluid restriction.   On your next visit with your primary care physician please Get Medicines reviewed and adjusted.   Please request your Prim.MD to go over all Hospital Tests and Procedure/Radiological results at the follow up, please get all Hospital records sent to your Prim MD by signing hospital release before you go home.   If you experience worsening of your admission symptoms, develop shortness of breath, life threatening emergency, suicidal or homicidal thoughts you must seek medical attention immediately by calling 911 or calling your MD immediately  if symptoms less severe.  You Must read complete instructions/literature along with all the possible adverse reactions/side effects for all the Medicines you take and that have been prescribed to you. Take any new Medicines after you have completely understood and accpet all the possible adverse reactions/side effects.   Do not drive, operate heavy machinery, perform activities at heights, swimming or participation in water activities or provide baby sitting services if your were admitted for syncope or siezures until you have seen by Primary MD or a Neurologist and advised to do so again.  Do not drive when taking Pain medications.    Do not take more than prescribed Pain, Sleep and Anxiety Medications  Special Instructions: If you have smoked or chewed Tobacco   in the last 2 yrs please stop smoking, stop any regular Alcohol  and or any Recreational drug use.  Wear Seat belts while driving.   Please note  You were cared for by a hospitalist during your hospital stay. If you have any questions about your discharge medications or the care you received while you were in the hospital after you are discharged, you can call the unit and asked to speak with the hospitalist on call if the hospitalist that took care of you is not available. Once you are discharged, your primary care physician will handle any further medical issues. Please note that NO REFILLS for any discharge medications will be authorized once you are discharged, as it is imperative that you return to your primary care physician (or establish a relationship with a primary care physician if you do not have one) for your aftercare needs so that they can reassess your need for medications and monitor your lab values.

## 2016-06-24 LAB — CULTURE, BLOOD (ROUTINE X 2)

## 2016-06-26 LAB — CULTURE, BLOOD (ROUTINE X 2)
Culture: NO GROWTH
Culture: NO GROWTH

## 2017-01-31 ENCOUNTER — Other Ambulatory Visit (HOSPITAL_COMMUNITY): Payer: Self-pay | Admitting: Oncology

## 2017-01-31 DIAGNOSIS — C20 Malignant neoplasm of rectum: Secondary | ICD-10-CM

## 2017-02-12 ENCOUNTER — Encounter (HOSPITAL_COMMUNITY): Payer: BLUE CROSS/BLUE SHIELD | Attending: Oncology

## 2017-02-12 DIAGNOSIS — C20 Malignant neoplasm of rectum: Secondary | ICD-10-CM

## 2017-02-12 LAB — CBC WITH DIFFERENTIAL/PLATELET
BASOS ABS: 0 10*3/uL (ref 0.0–0.1)
Basophils Relative: 1 %
EOS PCT: 4 %
Eosinophils Absolute: 0.3 10*3/uL (ref 0.0–0.7)
HEMATOCRIT: 43.1 % (ref 39.0–52.0)
Hemoglobin: 14.1 g/dL (ref 13.0–17.0)
LYMPHS ABS: 1.5 10*3/uL (ref 0.7–4.0)
LYMPHS PCT: 22 %
MCH: 28.8 pg (ref 26.0–34.0)
MCHC: 32.7 g/dL (ref 30.0–36.0)
MCV: 88.1 fL (ref 78.0–100.0)
MONO ABS: 0.8 10*3/uL (ref 0.1–1.0)
Monocytes Relative: 11 %
NEUTROS ABS: 4.1 10*3/uL (ref 1.7–7.7)
Neutrophils Relative %: 62 %
PLATELETS: 264 10*3/uL (ref 150–400)
RBC: 4.89 MIL/uL (ref 4.22–5.81)
RDW: 15.6 % — ABNORMAL HIGH (ref 11.5–15.5)
WBC: 6.6 10*3/uL (ref 4.0–10.5)

## 2017-02-12 LAB — COMPREHENSIVE METABOLIC PANEL
ALT: 16 U/L — ABNORMAL LOW (ref 17–63)
AST: 16 U/L (ref 15–41)
Albumin: 3.8 g/dL (ref 3.5–5.0)
Alkaline Phosphatase: 96 U/L (ref 38–126)
Anion gap: 6 (ref 5–15)
BILIRUBIN TOTAL: 0.6 mg/dL (ref 0.3–1.2)
BUN: 12 mg/dL (ref 6–20)
CHLORIDE: 103 mmol/L (ref 101–111)
CO2: 26 mmol/L (ref 22–32)
CREATININE: 0.87 mg/dL (ref 0.61–1.24)
Calcium: 9 mg/dL (ref 8.9–10.3)
Glucose, Bld: 92 mg/dL (ref 65–99)
Potassium: 4.5 mmol/L (ref 3.5–5.1)
Sodium: 135 mmol/L (ref 135–145)
TOTAL PROTEIN: 7.7 g/dL (ref 6.5–8.1)

## 2017-02-13 LAB — CEA: CEA: 2.6 ng/mL (ref 0.0–4.7)

## 2017-02-13 LAB — TESTOSTERONE, FREE: Testosterone, Free: 11.3 pg/mL (ref 7.2–24.0)

## 2017-02-15 ENCOUNTER — Ambulatory Visit (HOSPITAL_COMMUNITY)
Admission: RE | Admit: 2017-02-15 | Discharge: 2017-02-15 | Disposition: A | Discharge status: Home / Self Care | Payer: BLUE CROSS/BLUE SHIELD | Source: Ambulatory Visit | Attending: Oncology | Admitting: Oncology

## 2017-02-15 ENCOUNTER — Encounter (HOSPITAL_COMMUNITY): Payer: Self-pay

## 2017-02-15 DIAGNOSIS — C20 Malignant neoplasm of rectum: Secondary | ICD-10-CM

## 2017-02-15 DIAGNOSIS — Z932 Ileostomy status: Secondary | ICD-10-CM | POA: Diagnosis not present

## 2017-02-15 DIAGNOSIS — L02211 Cutaneous abscess of abdominal wall: Secondary | ICD-10-CM | POA: Diagnosis not present

## 2017-02-15 LAB — MISC LABCORP TEST (SEND OUT): LABCORP TEST CODE: 143255

## 2017-02-15 MED ORDER — IOPAMIDOL (ISOVUE-300) INJECTION 61%
100.0000 mL | Freq: Once | INTRAVENOUS | Status: AC | PRN
  Administered 2017-02-15: 100 mL via INTRAVENOUS

## 2017-02-16 LAB — TESTOSTERONE, % FREE: Testosterone-% Free: 2.1 % — ABNORMAL HIGH (ref 0.2–0.7)

## 2017-03-12 ENCOUNTER — Encounter (HOSPITAL_COMMUNITY): Payer: BLUE CROSS/BLUE SHIELD | Attending: Oncology | Admitting: Oncology

## 2017-03-12 ENCOUNTER — Encounter (HOSPITAL_COMMUNITY): Payer: BLUE CROSS/BLUE SHIELD

## 2017-03-12 DIAGNOSIS — L02818 Cutaneous abscess of other sites: Secondary | ICD-10-CM | POA: Diagnosis not present

## 2017-03-12 DIAGNOSIS — C20 Malignant neoplasm of rectum: Secondary | ICD-10-CM | POA: Diagnosis not present

## 2017-03-12 LAB — CBC WITH DIFFERENTIAL/PLATELET
Basophils Absolute: 0.1 10*3/uL (ref 0.0–0.1)
Basophils Relative: 1 %
Eosinophils Absolute: 0.2 10*3/uL (ref 0.0–0.7)
Eosinophils Relative: 3 %
HEMATOCRIT: 42.7 % (ref 39.0–52.0)
Hemoglobin: 13.8 g/dL (ref 13.0–17.0)
LYMPHS PCT: 17 %
Lymphs Abs: 1.2 10*3/uL (ref 0.7–4.0)
MCH: 28.6 pg (ref 26.0–34.0)
MCHC: 32.3 g/dL (ref 30.0–36.0)
MCV: 88.6 fL (ref 78.0–100.0)
MONO ABS: 1 10*3/uL (ref 0.1–1.0)
MONOS PCT: 14 %
NEUTROS ABS: 4.5 10*3/uL (ref 1.7–7.7)
Neutrophils Relative %: 65 %
Platelets: 293 10*3/uL (ref 150–400)
RBC: 4.82 MIL/uL (ref 4.22–5.81)
RDW: 15.5 % (ref 11.5–15.5)
WBC: 7 10*3/uL (ref 4.0–10.5)

## 2017-03-12 LAB — COMPREHENSIVE METABOLIC PANEL
ALT: 18 U/L (ref 17–63)
ANION GAP: 8 (ref 5–15)
AST: 18 U/L (ref 15–41)
Albumin: 3.7 g/dL (ref 3.5–5.0)
Alkaline Phosphatase: 100 U/L (ref 38–126)
BILIRUBIN TOTAL: 0.5 mg/dL (ref 0.3–1.2)
BUN: 12 mg/dL (ref 6–20)
CO2: 25 mmol/L (ref 22–32)
Calcium: 9.3 mg/dL (ref 8.9–10.3)
Chloride: 104 mmol/L (ref 101–111)
Creatinine, Ser: 0.83 mg/dL (ref 0.61–1.24)
GFR calc Af Amer: >60 mL/min (ref >60)
Glucose, Bld: 94 mg/dL (ref 65–99)
POTASSIUM: 4.2 mmol/L (ref 3.5–5.1)
Sodium: 137 mmol/L (ref 135–145)
Total Protein: 7.8 g/dL (ref 6.5–8.1)

## 2017-03-12 LAB — APTT: APTT: 25 s (ref 24–36)

## 2017-03-12 LAB — PROTIME-INR
INR: 1
Prothrombin Time: 13 seconds (ref 11.4–15.2)

## 2017-03-12 NOTE — Patient Instructions (Signed)
[place] at [facility] Discharge Instructions  RECOMMENDATIONS MADE BY THE CONSULTANT AND ANY TEST RESULTS WILL BE SENT TO YOUR REFERRING PHYSICIAN.   Labs & EKG today - fax to [place] follow up with MD in 4 months    Thank you for choosing [place] at [facility] to provide your oncology and hematology care.  To afford each patient quality time with our provider, please arrive at least 15 minutes before your scheduled appointment time.    If you have a lab appointment with the [place] please come in thru the  Main Entrance and check in at the main information desk  You need to re-schedule your appointment should you arrive 10 or more minutes late.  We strive to give you quality time with our providers, and arriving late affects you and other patients whose appointments are after yours.  Also, if you no show three or more times for appointments you may be dismissed from the clinic at the providers discretion.     Again, thank you for choosing [facility].  Our hope is that these requests will decrease the amount of time that you wait before being seen by our physicians.       _____________________________________________________________  Should you have questions after your visit to [facility], please contact our office at [phone] between the hours of 8:30 a.m. and 4:30 p.m.  Voicemails left after 4:30 p.m. will not be returned until the following business day.  For prescription refill requests, have your pharmacy contact our office.       Resources For Cancer Patients and their Caregivers ? American Cancer Society: Can assist with transportation, wigs, general needs, runs Look Good Feel Better.        ([phone] ? Cancer Care: Provides financial assistance, online support groups, medication/co-pay assistance.  1-800-813-HOPE 248-059-5624) ? [place] Assists [place] cancer patients and their families through emotional , educational and financial support.  973-738-1085 ? [place] DSS Where to apply for food stamps, Medicaid and utility assistance. (670)379-0519 ? [employer]: Transportation to medical appointments. 254-303-2133 ? Social Security Administration: May apply for disability if have a Stage IV cancer. (918)697-6995 (657)859-9571 ? [employer], Disability and Transit Services: Assists with nutrition, care and transit needs. [place] Support Programs: @10RELATIVEDAYS @ > Cancer Support Group  2nd Tuesday of the month 1pm-2pm, Journey Room  > Creative Journey  3rd Tuesday of the month 1130am-1pm, Journey Room  > Look Good Feel Better  1st Wednesday of the month 10am-12 noon, Journey Room (Call [place] to register [phone])

## 2017-03-12 NOTE — Assessment & Plan Note (Addendum)
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  S/P robotic assisted APR with coloanal anastomosis and DLI by Dr. Edwyna Perfect at [facility] on 06/07/2016.  This was complicated by a posterior anastomotic dehiscence with pelvic abscess.  AND Noncompliance- last seen in November 2017  Labs performed on 02/12/2017 at the request of [facility]: CBC diff, CMET, CEA, testosterone.  I personally reviewed and went over laboratory results with the patient.  The results are noted within this dictation.  I personally reviewed and went over radiographic studies with the patient.  The results are noted within this dictation.  I personally reviewed the images in PACS.  CT CAP on 02/15/2017 is reviewed.  CT chest is negative for any thoracic metastases.  Presacral abscess remains, but decreased in size.  No evidence of recurrence of disease.  He has a follow-up appointment with Dr. Lestine Box on 03/16/2017 for EUA and I&D of abscess.  Pre-operative testing will be completed today: EKG is ordered today.  EKG shows NSR. Labs today: CBC diff, CMET, PT/PTT, INR.  Will fax all results to [phone] Attn: Aissata S. Diallo.  Patient will return in 4 months for follow-up with labs: CBC diff, CMET, CEA.  Annual CT imaging surveillance will be due in June 2018.

## 2017-03-12 NOTE — Progress Notes (Signed)
Jonathan Wright, [address] [place] [address]  Rectal cancer [facility]) - Plan: CBC with Differential, Comprehensive metabolic panel, APTT, Protime-INR, CBC with Differential, Comprehensive metabolic panel, CEA, EKG 28-NOMV  CURRENT THERAPY: Surveillance per NCCN guidelines  INTERVAL HISTORY: Jonathan Wright 51 y.o. 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  S/P robotic assisted APR with coloanal anastomosis and DLI by Dr. Edwyna Perfect at [facility] on 06/07/2016.  This was complicated by a posterior anastomotic dehiscence with pelvic abscess.   Oncology History   Stage IIIB rectal adenocarcinoma, mT2N2M0 Treated with 5Gy x 5 at [facility]     Rectal cancer [facility])   09/20/2015 Procedure    colonoscopy with firm rectal mass and 4 cm polyp in proximal rectum, infiltrating non-obstructing large mass within the distal rectum extending towards anal verge      09/20/2015 Miscellaneous    Microsatellite stable.       09/28/2015 Imaging    CT C/A/P no evidence of distant metastatic disease. irregular thickening of the low rectum and anal canal, several enlarged perirectal LN, indeterminate 41m RUL nodule      09/28/2015 Imaging    MRI pelvis assym diffuse circum lobular thickening of rectum, near full wall thickness extension along lower anterior rectum W/O extra serosal extension c/w known adeno, mult. perirectal LN      12/06/2015 - 12/10/2015 Radiation Therapy    [facility] Dr. AAngelina Ok  2500.00 cGy, 500.00 cGy per day in 5 fractions delivered 1x per day to the 96.0% Isodose line Treatment Dates: 12/06/2015 through 12/10/2015.       01/03/2016 Procedure    PICC placed      01/04/2016 - 03/14/2016 Chemotherapy    FOLFOX x 6 cycles      06/07/2016 Definitive Surgery    Robotic assisted APR with coloanal anastomosis [facility]     06/19/2016 - 06/23/2016 Hospital Admission    Admit date: 06/19/2016 Admission diagnosis: Sepsis Additional comments: Sepsis due to multiple gram-negative active uremia. Likely source [facility] surgery following, perirectal abscess draining by itself, white count and temperatures improved. He had a PICC line from prior to this admission which was line removed on 06/21/2016, vancomycin stopped on 06/22/2016, He tolerated test dose of Rocephin well on 06/22/2016 without any issues, [facility], requested to follow with PCP in 3 days to make sure he is tolerating oral medications well and has continued to defervesce, request PCP to please check final culture and sensitivity results which should be back in 3 days. He will follow with general surgery outpatient as well.      02/15/2017 Imaging    CT CAP- 1. Well-formed presacral abscess is decreased in size but persistent. 2. No evidence of colorectal cancer metastasis in the abdomen pelvis. 3. Loop ileostomy in the RIGHT lower quadrant.        HPI Elements   Location: Rectum  Quality: Adenocarcinoma  Severity: Stage IIIB  Duration: Dx in 2017  [facility]  Timing:   Modifying Factors:  Noncompliance  Associated Signs & Symptoms:    He continues to work with [facility] remark regarding management of his presacral abscess.  He has an appointment on 03/16/2017 for an EUA and I&D of abscess.    Review of Systems  Constitutional: Positive for malaise/fatigue. Negative for chills, fever and weight loss.  HENT: Negative.   Eyes: Negative.   Respiratory: Negative.  Negative for cough.   Cardiovascular: Negative.  Negative  for chest pain.  Gastrointestinal: Negative.  Negative for blood in stool, constipation, diarrhea, melena, nausea and vomiting.  Genitourinary: Negative.   Musculoskeletal: Negative.   Skin: Negative.   Neurological: Negative.  Negative for weakness.  Endo/Heme/Allergies: Negative.   Psychiatric/Behavioral: Negative.     Past Medical History:  Diagnosis Date  . Anxiety   . Depression   . Rectal cancer ([place]) 12/17/2015    Past Surgical History:  Procedure Laterality Date  . ABDOMINAL PERINEAL BOWEL RESECTION  06/07/2016   [facility]  . COLON SURGERY    . DIVERTING ILEOSTOMY  06/07/2016   [facility]  . LAPAROSCOPIC LOW ANTERIOR RESCECTION WITH COLOANAL ANASTOMOSIS  06/07/2016   [facility]  . PERIPHERALLY INSERTED CENTRAL CATHETER INSERTION  11/2015    Family History  Problem Relation Age of Onset  . Rectal cancer Neg Hx     Social History   Social History  . Marital status: Divorced    Spouse name: [name]  . Number of children: [name]  . Years of education: [name]   Social History Main Topics  . Smoking status: Never Smoker  . Smokeless tobacco: Never Used  . Alcohol use Yes     Comment: occ.   . Drug use: No  . Sexual activity: Not on file   Other Topics Concern  . Not on file   Social History Narrative  . No narrative on file     PHYSICAL EXAMINATION  ECOG PERFORMANCE STATUS: 1 - Symptomatic but completely ambulatory  Vitals:   03/12/17 0928  BP: 118/77  Pulse: 86  Resp: 16  Temp: 98.5 F (36.9 C)    GENERAL:alert, no distress, well nourished, well developed, comfortable, cooperative, smiling and unaccompanied SKIN: skin color, texture, turgor are normal, no rashes or significant lesions HEAD: Normocephalic, No masses, lesions, tenderness or abnormalities EYES: normal, EOMI, Conjunctiva are pink and non-injected EARS: External ears normal OROPHARYNX:lips, buccal mucosa, and tongue normal and mucous membranes are moist  NECK: supple, no  adenopathy, trachea midline LYMPH:  no palpable lymphadenopathy BREAST:not examined LUNGS: clear to auscultation and percussion HEART: regular rate & rhythm, no murmurs, no gallops, S1 normal and S2 normal ABDOMEN:abdomen soft and normal bowel sounds BACK: Back symmetric, no curvature., No CVA tenderness EXTREMITIES:less then 2 second capillary refill, no joint deformities, effusion, or inflammation, no edema, no skin discoloration, no clubbing, no cyanosis  NEURO: alert & oriented x 3 with fluent speech, no focal motor/sensory deficits, gait normal   LABORATORY DATA: CBC    Component Value Date/Time   WBC 7.0 03/12/2017 1010   RBC 4.82 03/12/2017 1010   HGB 13.8 03/12/2017 1010   HCT 42.7 03/12/2017 1010   PLT 293 03/12/2017 1010   MCV 88.6 03/12/2017 1010   MCH 28.6 03/12/2017 1010   MCHC 32.3 03/12/2017 1010   RDW 15.5 03/12/2017 1010   LYMPHSABS 1.2 03/12/2017 1010   MONOABS 1.0 03/12/2017 1010   EOSABS 0.2 03/12/2017 1010   BASOSABS 0.1 03/12/2017 1010      Chemistry  Component Value Date/Time   NA 137 03/12/2017 1010   K 4.2 03/12/2017 1010   CL 104 03/12/2017 1010   CO2 25 03/12/2017 1010   BUN 12 03/12/2017 1010   CREATININE 0.83 03/12/2017 1010      Component Value Date/Time   CALCIUM 9.3 03/12/2017 1010   ALKPHOS 100 03/12/2017 1010   AST 18 03/12/2017 1010   ALT 18 03/12/2017 1010   BILITOT 0.5 03/12/2017 1010     Lab Results  Component Value Date   CEA 2.6 02/12/2017   Lab Results  Component Value Date   INR 1.00 03/12/2017   INR 1.14 06/20/2016   INR 1.12 06/19/2016   Results for DESTINE, AMBROISE (MRN [id number]) as of 03/12/2017 11:13  Ref. Range 03/12/2017 10:10  Prothrombin Time Latest Ref Range: 11.4 - 15.2 seconds 13.0  INR Unknown 1.00  APTT Latest Ref Range: 24 - 36 seconds 25    PENDING LABS:   RADIOGRAPHIC STUDIES:  Ct Chest W Contrast  Result Date: 02/15/2017 CLINICAL DATA:  Colorectal carcinoma with colostomy. Rectal  abscess. EXAM: CT CHEST, ABDOMEN, AND PELVIS WITH CONTRAST TECHNIQUE: Multidetector CT imaging of the chest, abdomen and pelvis was performed following the standard protocol during bolus administration of intravenous contrast. CONTRAST:  184m ISOVUE-300 IOPAMIDOL (ISOVUE-300) INJECTION 61% COMPARISON:  CT 06/19/2016 FINDINGS: CT CHEST FINDINGS Cardiovascular: No significant vascular findings. Normal heart size. No pericardial effusion. Mediastinum/Nodes: No axillary supraclavicular adenopathy. No mediastinal hilar adenopathy. No pericardial fluid. Lungs/Pleura: No suspicious pulmonary nodules. Musculoskeletal: No aggressive osseous lesion. CT ABDOMEN AND PELVIS FINDINGS Hepatobiliary: No focal hepatic lesion. No biliary ductal dilatation. Gallbladder is normal. Common bile duct is normal. Pancreas: Pancreas is normal. No ductal dilatation. No pancreatic inflammation. Spleen: Normal spleen Adrenals/urinary tract: Adrenal glands and kidneys are normal. The ureters and bladder normal. Stomach/Bowel: Stomach, duodenum small bowel are normal. RIGHT lower quadrant loop ileostomy. The ascending colon is stool filled. The transverse and descending colon normal. Sigmoid colon collapses. There is a well-circumscribed fluid collection posterior to the rectum measuring 4.2 x 2.9 cm which is decreased in size from 7.2 x 3.5 cm. The fluid collections in the presacral space has a thin enhancing rim (image 115, series 2). Vascular/Lymphatic: Abdominal aorta normal caliber. Small common iliac lymph nodes less than 9 mm. No new adenopathy. Reproductive: Prostate normal Other: No peritoneal metastasis Musculoskeletal: No aggressive osseous lesion. IMPRESSION: Chest Impression: No evidence of thoracic metastasis. Abdomen / Pelvis Impression: 1. Well-formed presacral abscess is decreased in size but persistent. 2. No evidence of colorectal cancer metastasis in the abdomen pelvis. 3. Loop ileostomy in the RIGHT lower quadrant.  Electronically Signed   By: SSuzy BouchardM.D.   On: 02/15/2017 17:38   Ct Abdomen Pelvis W Contrast  Result Date: 02/15/2017 CLINICAL DATA:  Colorectal carcinoma with colostomy. Rectal abscess. EXAM: CT CHEST, ABDOMEN, AND PELVIS WITH CONTRAST TECHNIQUE: Multidetector CT imaging of the chest, abdomen and pelvis was performed following the standard protocol during bolus administration of intravenous contrast. CONTRAST:  1025mISOVUE-300 IOPAMIDOL (ISOVUE-300) INJECTION 61% COMPARISON:  CT 06/19/2016 FINDINGS: CT CHEST FINDINGS Cardiovascular: No significant vascular findings. Normal heart size. No pericardial effusion. Mediastinum/Nodes: No axillary supraclavicular adenopathy. No mediastinal hilar adenopathy. No pericardial fluid. Lungs/Pleura: No suspicious pulmonary nodules. Musculoskeletal: No aggressive osseous lesion. CT ABDOMEN AND PELVIS FINDINGS Hepatobiliary: No focal hepatic lesion. No biliary ductal dilatation. Gallbladder is normal. Common bile duct is normal. Pancreas: Pancreas is normal. No ductal dilatation. No pancreatic inflammation. Spleen: Normal spleen Adrenals/urinary  tract: Adrenal glands and kidneys are normal. The ureters and bladder normal. Stomach/Bowel: Stomach, duodenum small bowel are normal. RIGHT lower quadrant loop ileostomy. The ascending colon is stool filled. The transverse and descending colon normal. Sigmoid colon collapses. There is a well-circumscribed fluid collection posterior to the rectum measuring 4.2 x 2.9 cm which is decreased in size from 7.2 x 3.5 cm. The fluid collections in the presacral space has a thin enhancing rim (image 115, series 2). Vascular/Lymphatic: Abdominal aorta normal caliber. Small common iliac lymph nodes less than 9 mm. No new adenopathy. Reproductive: Prostate normal Other: No peritoneal metastasis Musculoskeletal: No aggressive osseous lesion. IMPRESSION: Chest Impression: No evidence of thoracic metastasis. Abdomen / Pelvis Impression: 1.  Well-formed presacral abscess is decreased in size but persistent. 2. No evidence of colorectal cancer metastasis in the abdomen pelvis. 3. Loop ileostomy in the RIGHT lower quadrant. Electronically Signed   By: Suzy Bouchard M.D.   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  S/P robotic assisted APR with coloanal anastomosis and DLI by Dr. Edwyna Perfect at [facility] on 06/07/2016.  This was complicated by a posterior anastomotic dehiscence with pelvic abscess.  AND Noncompliance- last seen in November 2017  Labs performed on 02/12/2017 at the request of [facility]: CBC diff, CMET, CEA, testosterone.  I personally reviewed and went over laboratory results with the patient.  The results are noted within this dictation.  I personally reviewed and went over radiographic studies with the patient.  The results are noted within this dictation.  I personally reviewed the images in PACS.  CT CAP on 02/15/2017 is reviewed.  CT chest is negative for any thoracic metastases.  Presacral abscess remains, but decreased in size.  No evidence of recurrence of disease.  He has a follow-up appointment with Dr. Lestine Box on 03/16/2017 for EUA and I&D of abscess.  Pre-operative testing will be completed today: EKG is ordered today.  EKG shows NSR. Labs today: CBC diff, CMET, PT/PTT, INR.  Will fax all results to [phone] Attn: Aissata S. Diallo.  Patient will return in 4 months for follow-up with labs: CBC diff, CMET, CEA.  Annual CT imaging surveillance will be due in June 2018.  ORDERS PLACED FOR THIS ENCOUNTER: Orders Placed This Encounter  Procedures  . CBC with Differential  . Comprehensive metabolic panel  . APTT    . Protime-INR  . CBC with Differential  . Comprehensive metabolic panel  . CEA  . EKG 12-Lead    MEDICATIONS PRESCRIBED THIS ENCOUNTER: No orders of the defined types were placed in this encounter.   THERAPY PLAN:  NCCN guidelines regarding surveillance for rectal cancer are as follows (1.2018):  1. Stage I with full surgical staging:   A. Colonoscopy 1 year    1. If advanced adenoma, repeat in 1 year.    2. If no advanced adenoma, repeat in 3 years, then every 5 years  2. Stage II, III:   A. History and physical every 3-6 months for 2 years, then every 6 months for a total of 5 years.   B. CEA every 3-6 months for 2 years, then every 6 months for a total of 5 years.  C. Chest/abdominal/pelvic CT every 6-12 months (category 2b for frequency less than 12 months) for a total of 5 years.   D. Colonoscopy in 1 year except if no preoperative colonoscopy due to obstructing lesion, colonoscopy in 3-6 months.    1. If advanced adenoma, repeat in 1 year    2. If no advanced adenoma, repeat in 3 years, then every 5 years.   E. PET/CT scan is not recommended.  3. Stage IV:   A. History and physical every 3-6 months for 2 years, then every 6 months for a total of 5 years.   B. CEA every 3-6 months for 2 years, then every 6 months for a total of 5 years.   C. Chest/abdominal/pelvic CT every 3-6 months (category 2b for frequency less than 6 months) for 2 years, then every 6-12 months for a total of 5 years.   D. Colonoscopy in 1 year except if no preoperative colonoscopy due to obstructing lesion, colonoscopy in 3-6 months.    1. If advanced adenoma, repeat in 1 year    2. If no advanced adenoma, repeat in 3 years, then every 5 years.   E. PET/CT scan is not recommended.        All questions were answered. The patient knows to call the clinic with any problems, questions or concerns. We can certainly see the patient much sooner if necessary.  Patient and plan discussed with Dr. Twana First  and she is in agreement with the aforementioned.   This note is electronically signed by: Doy Mince 03/12/2017 11:13 AM

## 2017-03-27 ENCOUNTER — Other Ambulatory Visit (HOSPITAL_COMMUNITY): Payer: Self-pay | Admitting: Adult Health

## 2017-03-27 DIAGNOSIS — C20 Malignant neoplasm of rectum: Secondary | ICD-10-CM

## 2017-04-02 ENCOUNTER — Other Ambulatory Visit (HOSPITAL_COMMUNITY): Payer: Self-pay | Admitting: Emergency Medicine

## 2017-04-02 MED ORDER — ALPRAZOLAM 0.5 MG PO TABS: ORAL_TABLET | ORAL | 1 refills | Status: DC

## 2017-04-02 NOTE — Progress Notes (Signed)
Xanax refilled.  

## 2017-04-05 ENCOUNTER — Other Ambulatory Visit (HOSPITAL_COMMUNITY): Payer: Self-pay | Admitting: Adult Health

## 2017-04-05 ENCOUNTER — Encounter (HOSPITAL_COMMUNITY): Payer: Self-pay

## 2017-04-05 ENCOUNTER — Ambulatory Visit (HOSPITAL_COMMUNITY): Payer: BLUE CROSS/BLUE SHIELD

## 2017-04-05 ENCOUNTER — Ambulatory Visit (HOSPITAL_COMMUNITY)
Admission: RE | Admit: 2017-04-05 | Discharge: 2017-04-05 | Disposition: A | Discharge status: Home / Self Care | Payer: BLUE CROSS/BLUE SHIELD | Source: Ambulatory Visit | Attending: Adult Health | Admitting: Adult Health

## 2017-04-05 DIAGNOSIS — R938 Abnormal findings on diagnostic imaging of other specified body structures: Secondary | ICD-10-CM | POA: Diagnosis not present

## 2017-04-05 DIAGNOSIS — C20 Malignant neoplasm of rectum: Secondary | ICD-10-CM

## 2017-04-05 DIAGNOSIS — Z9889 Other specified postprocedural states: Secondary | ICD-10-CM | POA: Insufficient documentation

## 2017-04-05 DIAGNOSIS — Z932 Ileostomy status: Secondary | ICD-10-CM | POA: Diagnosis not present

## 2017-04-05 MED ORDER — IOPAMIDOL (ISOVUE-300) INJECTION 61%
100.0000 mL | Freq: Once | INTRAVENOUS | Status: AC | PRN
  Administered 2017-04-05: 100 mL via INTRAVENOUS

## 2017-04-06 ENCOUNTER — Ambulatory Visit (HOSPITAL_COMMUNITY): Payer: BLUE CROSS/BLUE SHIELD

## 2017-04-11 ENCOUNTER — Other Ambulatory Visit (HOSPITAL_COMMUNITY): Payer: BLUE CROSS/BLUE SHIELD

## 2017-04-12 ENCOUNTER — Ambulatory Visit (HOSPITAL_COMMUNITY): Payer: BLUE CROSS/BLUE SHIELD

## 2017-04-13 ENCOUNTER — Ambulatory Visit (HOSPITAL_COMMUNITY): Admission: RE | Admit: 2017-04-13 | Payer: BLUE CROSS/BLUE SHIELD | Source: Ambulatory Visit

## 2017-04-16 ENCOUNTER — Other Ambulatory Visit (HOSPITAL_COMMUNITY): Payer: Self-pay | Admitting: Family

## 2017-04-16 DIAGNOSIS — C20 Malignant neoplasm of rectum: Secondary | ICD-10-CM

## 2017-04-20 ENCOUNTER — Ambulatory Visit (HOSPITAL_COMMUNITY): Payer: BLUE CROSS/BLUE SHIELD

## 2017-04-20 ENCOUNTER — Ambulatory Visit (HOSPITAL_COMMUNITY)
Admission: RE | Admit: 2017-04-20 | Discharge: 2017-04-20 | Disposition: A | Discharge status: Home / Self Care | Payer: BLUE CROSS/BLUE SHIELD | Source: Ambulatory Visit | Attending: Family | Admitting: Family

## 2017-04-20 DIAGNOSIS — C20 Malignant neoplasm of rectum: Secondary | ICD-10-CM | POA: Diagnosis not present

## 2017-04-20 MED ORDER — GADOBENATE DIMEGLUMINE 529 MG/ML IV SOLN
15.0000 mL | Freq: Once | INTRAVENOUS | Status: AC | PRN
  Administered 2017-04-20: 15 mL via INTRAVENOUS

## 2017-04-25 ENCOUNTER — Telehealth (HOSPITAL_COMMUNITY): Payer: Self-pay | Admitting: *Deleted

## 2017-04-25 NOTE — Telephone Encounter (Signed)
returned phone call regarding an appointment, no answer, left voice message.

## 2017-05-10 ENCOUNTER — Other Ambulatory Visit (HOSPITAL_COMMUNITY): Payer: Self-pay | Admitting: Emergency Medicine

## 2017-05-10 ENCOUNTER — Ambulatory Visit (INDEPENDENT_AMBULATORY_CARE_PROVIDER_SITE_OTHER): Payer: BLUE CROSS/BLUE SHIELD | Admitting: Licensed Clinical Social Worker

## 2017-05-10 DIAGNOSIS — C20 Malignant neoplasm of rectum: Secondary | ICD-10-CM

## 2017-05-10 DIAGNOSIS — F0631 Mood disorder due to known physiological condition with depressive features: Secondary | ICD-10-CM | POA: Diagnosis not present

## 2017-05-10 DIAGNOSIS — K611 Rectal abscess: Secondary | ICD-10-CM
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
## 2017-05-15 ENCOUNTER — Encounter (HOSPITAL_COMMUNITY): Payer: BLUE CROSS/BLUE SHIELD | Attending: Oncology

## 2017-05-15 ENCOUNTER — Ambulatory Visit (HOSPITAL_COMMUNITY): Payer: Self-pay

## 2017-05-15 ENCOUNTER — Encounter (HOSPITAL_COMMUNITY): Payer: Self-pay

## 2017-05-15 ENCOUNTER — Encounter (HOSPITAL_COMMUNITY): Payer: BLUE CROSS/BLUE SHIELD | Attending: Oncology | Admitting: Oncology

## 2017-05-15 ENCOUNTER — Other Ambulatory Visit (HOSPITAL_COMMUNITY): Payer: Self-pay

## 2017-05-15 VITALS — BP 107/80 | HR 88 | Temp 98.6°F | Resp 18 | Wt 179.1 lb

## 2017-05-15 DIAGNOSIS — C20 Malignant neoplasm of rectum: Secondary | ICD-10-CM | POA: Insufficient documentation

## 2017-05-15 DIAGNOSIS — K611 Rectal abscess: Secondary | ICD-10-CM

## 2017-05-15 DIAGNOSIS — R5383 Other fatigue: Secondary | ICD-10-CM | POA: Diagnosis not present

## 2017-05-15 LAB — CBC WITH DIFFERENTIAL/PLATELET
Basophils Absolute: 0 10*3/uL (ref 0.0–0.1)
Basophils Relative: 1 %
EOS ABS: 0.2 10*3/uL (ref 0.0–0.7)
Eosinophils Relative: 4 %
HEMATOCRIT: 43.3 % (ref 39.0–52.0)
HEMOGLOBIN: 14.2 g/dL (ref 13.0–17.0)
LYMPHS ABS: 1.2 10*3/uL (ref 0.7–4.0)
LYMPHS PCT: 24 %
MCH: 29 pg (ref 26.0–34.0)
MCHC: 32.8 g/dL (ref 30.0–36.0)
MCV: 88.4 fL (ref 78.0–100.0)
MONOS PCT: 13 %
Monocytes Absolute: 0.7 10*3/uL (ref 0.1–1.0)
NEUTROS ABS: 3 10*3/uL (ref 1.7–7.7)
NEUTROS PCT: 58 %
PLATELETS: 223 10*3/uL (ref 150–400)
RBC: 4.9 MIL/uL (ref 4.22–5.81)
RDW: 16.5 % — ABNORMAL HIGH (ref 11.5–15.5)
WBC: 5.1 10*3/uL (ref 4.0–10.5)

## 2017-05-15 LAB — COMPREHENSIVE METABOLIC PANEL
ALT: 29 U/L (ref 17–63)
AST: 25 U/L (ref 15–41)
Albumin: 4.2 g/dL (ref 3.5–5.0)
Alkaline Phosphatase: 73 U/L (ref 38–126)
Anion gap: 7 (ref 5–15)
BUN: 9 mg/dL (ref 6–20)
CHLORIDE: 104 mmol/L (ref 101–111)
CO2: 27 mmol/L (ref 22–32)
CREATININE: 0.9 mg/dL (ref 0.61–1.24)
Calcium: 9.6 mg/dL (ref 8.9–10.3)
GFR calc non Af Amer: >60 mL/min (ref >60)
Glucose, Bld: 99 mg/dL (ref 65–99)
POTASSIUM: 4.2 mmol/L (ref 3.5–5.1)
SODIUM: 138 mmol/L (ref 135–145)
Total Bilirubin: 0.6 mg/dL (ref 0.3–1.2)
Total Protein: 8 g/dL (ref 6.5–8.1)

## 2017-05-15 LAB — PROTIME-INR
INR: 0.95
PROTHROMBIN TIME: 12.6 s (ref 11.4–15.2)

## 2017-05-15 LAB — APTT: APTT: 25 s (ref 24–36)

## 2017-05-15 NOTE — Patient Instructions (Signed)
[facility] Discharge Instructions  RECOMMENDATIONS MADE BY THE CONSULTANT AND ANY TEST RESULTS WILL BE SENT TO YOUR REFERRING PHYSICIAN.  You were seen today by Dr. Louise Zhou    Thank you for choosing [facility] to provide your oncology and hematology care.  To afford each patient quality time with our provider, please arrive at least 15 minutes before your scheduled appointment time.    If you have a lab appointment with the Cancer Center please come in thru the  Main Entrance and check in at the main information desk  You need to re-schedule your appointment should you arrive 10 or more minutes late.  We strive to give you quality time with our providers, and arriving late affects you and other patients whose appointments are after yours.  Also, if you no show three or more times for appointments you may be dismissed from the clinic at the providers discretion.     Again, thank you for choosing [facility].  Our hope is that these requests will decrease the amount of time that you wait before being seen by our physicians.       _____________________________________________________________  Should you have questions after your visit to  Cancer Center, please contact our office at [phone] between the hours of 8:30 a.m. and 4:30 p.m.  Voicemails left after 4:30 p.m. will not be returned until the following business day.  For prescription refill requests, have your pharmacy contact our office.       Resources For Cancer Patients and their Caregivers ? American Cancer Society: Can assist with transportation, wigs, general needs, runs Look Good Feel Better.        1-888-227-6333 ? Cancer Care: Provides financial assistance, online support groups, medication/co-pay assistance.  1-800-813-HOPE (4673) ? [facility] Assists [place] cancer patients and their  families through emotional , educational and financial support.  336-427-4357 ? [place] DSS Where to apply for food stamps, Medicaid and utility assistance. 336-342-1394 ? [facility]: Transportation to medical appointments. 336-347-2287 ? Social Security Administration: May apply for disability if have a Stage IV cancer. 336-342-7796 1-800-772-1213 ? [place] Aging, Disability and Transit Services: Assists with nutrition, care and transit needs. 336-349-2343  Cancer Center Support Programs: @10RELATIVEDAYS@ > Cancer Support Group  2nd Tuesday of the month 1pm-2pm, Journey Room  > Creative Journey  3rd Tuesday of the month 1130am-1pm, Journey Room  > Look Good Feel Better  1st Wednesday of the month 10am-12 noon, Journey Room (Call American Cancer Society to register 1-800-395-5775)    

## 2017-05-15 NOTE — Progress Notes (Signed)
Jonathan Prose, MD [address] [place] [address]  No diagnosis found.  CURRENT THERAPY: Surveillance per NCCN guidelines  INTERVAL HISTORY: Jonathan Wright 51 y.o. 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  S/P robotic assisted APR with coloanal anastomosis and DLI by Dr. Edwyna Perfect at [facility] on 06/07/2016.  This was complicated by a posterior anastomotic dehiscence with pelvic abscess.   Oncology History   Stage IIIB rectal adenocarcinoma, mT2N2M0 Treated with 5Gy x 5 at [facility]     Rectal cancer [facility])   09/20/2015 Procedure    colonoscopy with firm rectal mass and 4 cm polyp in proximal rectum, infiltrating non-obstructing large mass within the distal rectum extending towards anal verge      09/20/2015 Miscellaneous    Microsatellite stable.       09/28/2015 Imaging    CT C/A/P no evidence of distant metastatic disease. irregular thickening of the low rectum and anal canal, several enlarged perirectal LN, indeterminate 71m RUL nodule      09/28/2015 Imaging    MRI pelvis assym diffuse circum lobular thickening of rectum, near full wall thickness extension along lower anterior rectum W/O extra serosal extension c/w known adeno, mult. perirectal LN      12/06/2015 - 12/10/2015 Radiation Therapy    [facility] Dr. AAngelina Ok  2500.00 cGy, 500.00 cGy per day in 5 fractions delivered 1x per day to the 96.0% Isodose line Treatment Dates: 12/06/2015 through 12/10/2015.       01/03/2016 Procedure    PICC placed      01/04/2016 - 03/14/2016 Chemotherapy    FOLFOX x 6 cycles      06/07/2016 Definitive Surgery    Robotic assisted APR with coloanal anastomosis [facility]     06/19/2016 -  06/23/2016 Hospital Admission    Admit date: 06/19/2016 Admission diagnosis: Sepsis Additional comments: Sepsis due to multiple gram-negative active uremia. Likely source [facility]irectal infection [facility] general surgery following, perirectal abscess draining by itself, white count and temperatures improved. He had a PICC line from prior to this admission which was line removed on 06/21/2016, vancomycin stopped on 06/22/2016, He tolerated test dose of Rocephin well on 06/22/2016 without any issues, [facility], requested to follow with PCP in 3 days to make sure he is tolerating oral medications well and has continued to defervesce, request PCP to please check final culture and sensitivity results which should be back in 3 days. He will follow with general surgery outpatient as well.      02/15/2017 Imaging    CT CAP- 1. Well-formed presacral abscess is decreased in size but persistent. 2. No evidence of colorectal cancer metastasis in the abdomen pelvis. 3. Loop ileostomy in the RIGHT lower quadrant.      04/05/2017 Imaging    CT CAP-IMPRESSION: 1. Status post loop right-sided ileostomy with sigmoid to anal anastomosis. 2. Decrease in presacral fluid collection, consistent with abscess. New foci of extraluminal gas, suspicious for fistulous communication to the lower sigmoid. 3. No evidence of metastatic disease.      04/20/2017 Imaging    MRI  pelvis w/ and w/o contrast IMPRESSION: 1. No appreciable change in chronic complex presacral space abscess, which demonstrates four separate sites of fistulous communication to the sigmoid colon and colo-anal anastomosis, as detailed. No discrete mass or other findings to suggest local tumor recurrence. 2. No evidence of metastatic disease in the pelvis.        Patient presents for continued follow up. He continues to see [facility]  for ongoing management of his presacral abscess. He recently had reimaging performed with CT C/A/P on 04/05/17 and MRI pelvis on 04/20/17. 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 Patient denies any pain. However he does states he occasionally has an odd feeling when he sits down and occasionally feels pressure when he urinates. He states he no longer has erections. He states his appetite is the same and denies any weight loss. Denies any chest pain, shortness of breath, abdominal pain. No recent fevers/chills.  Review of Systems  Constitutional: Positive for malaise/fatigue. Negative for chills, fever and weight loss.  HENT: Negative.   Eyes: Negative.   Respiratory: Negative.  Negative for cough.   Cardiovascular: Negative.  Negative for chest pain.  Gastrointestinal: Negative.  Negative for blood in stool, constipation, diarrhea, melena, nausea and vomiting.  Genitourinary: Negative.   Musculoskeletal: Negative.   Skin: Negative.   Neurological: Negative.  Negative for weakness.  Endo/Heme/Allergies: Negative.   Psychiatric/Behavioral: Negative.     Past Medical History:  Diagnosis Date  . Anxiety   . Depression   . Rectal cancer ([place]) 12/17/2015    Past Surgical History:  Procedure Laterality Date  . ABDOMINAL PERINEAL BOWEL RESECTION  06/07/2016   [facility]  . COLON SURGERY    . DIVERTING ILEOSTOMY  06/07/2016   [facility]  . LAPAROSCOPIC LOW ANTERIOR RESCECTION WITH COLOANAL ANASTOMOSIS  06/07/2016   [facility]  . PERIPHERALLY INSERTED CENTRAL CATHETER INSERTION  11/2015    Family History  Problem Relation Age of Onset  . Rectal cancer Neg Hx     Social History   Social History  . Marital status: Divorced    Spouse name: [name]  . Number of children: [name]  . Years of education: [name]   Social History Main Topics  . Smoking status: Never Smoker  . Smokeless tobacco: Never Used  . Alcohol use Yes      Comment: occ.   . Drug use: No  . Sexual activity: Not Asked   Other Topics Concern  . None   Social History Narrative  . None     PHYSICAL EXAMINATION  ECOG PERFORMANCE STATUS: 1 - Symptomatic but completely ambulatory  Vitals:   05/15/17 1530  BP: 107/80  Pulse: 88  Resp: 18  Temp: 98.6 F (37 C)  SpO2: 100%    Constitutional: Well-developed, well-nourished, and in no distress.   HENT:  Head: Normocephalic and atraumatic.  Mouth/Throat: No oropharyngeal exudate. Mucosa moist. Eyes: Pupils are equal, round, and reactive to light. Conjunctivae are normal. No scleral icterus.  Neck: Normal range of motion. Neck supple. No JVD present.  Cardiovascular: Normal rate, regular rhythm and normal heart sounds.  Exam reveals no gallop and no friction rub.   No murmur heard. Pulmonary/Chest: Effort normal and breath sounds normal. No respiratory distress. No wheezes.No rales.  Abdominal: Soft. Bowel sounds are normal. No distension. There is no tenderness. There is no guarding. +RLQ ostomy.  Musculoskeletal: No edema or tenderness.  Lymphadenopathy:    No cervical  or supraclavicular adenopathy.  Neurological: Alert and oriented to person, place, and time. No cranial nerve deficit.  Skin: Skin is warm and dry. No rash noted. No erythema. No pallor.  Psychiatric: Affect and judgment normal.     LABORATORY DATA: CBC    Component Value Date/Time   WBC 5.1 05/15/2017 1501   RBC 4.90 05/15/2017 1501   HGB 14.2 05/15/2017 1501   HCT 43.3 05/15/2017 1501   PLT 223 05/15/2017 1501   MCV 88.4 05/15/2017 1501   MCH 29.0 05/15/2017 1501   MCHC 32.8 05/15/2017 1501   RDW 16.5 (H) 05/15/2017 1501   LYMPHSABS 1.2 05/15/2017 1501   MONOABS 0.7 05/15/2017 1501   EOSABS 0.2 05/15/2017 1501   BASOSABS 0.0 05/15/2017 1501      Chemistry      Component Value Date/Time   NA 138 05/15/2017 1501   K 4.2 05/15/2017 1501   CL 104 05/15/2017 1501   CO2 27 05/15/2017 1501   BUN 9  05/15/2017 1501   CREATININE 0.90 05/15/2017 1501      Component Value Date/Time   CALCIUM 9.6 05/15/2017 1501   ALKPHOS 73 05/15/2017 1501   AST 25 05/15/2017 1501   ALT 29 05/15/2017 1501   BILITOT 0.6 05/15/2017 1501     Lab Results  Component Value Date   CEA 2.6 02/12/2017   Lab Results  Component Value Date   INR 0.95 05/15/2017   INR 1.00 03/12/2017   INR 1.14 06/20/2016   Results for PORTER, MOES (MRN [id number]) as of 03/12/2017 11:13  Ref. Range 03/12/2017 10:10  Prothrombin Time Latest Ref Range: 11.4 - 15.2 seconds 13.0  INR Unknown 1.00  APTT Latest Ref Range: 24 - 36 seconds 25    PENDING LABS:   RADIOGRAPHIC STUDIES:  Mr Pelvis W Wo Contrast  Result Date: 04/21/2017 CLINICAL DATA:  History of low anterior resection with colo-anal anastomosis 06/07/2016 for rectal cancer. Restaging. EXAM: MRI PELVIS WITHOUT AND WITH CONTRAST TECHNIQUE: Multiplanar multisequence MR imaging of the pelvis was performed both before and after administration of intravenous contrast. Approximately 30-40 cc ultrasound gel administered via anal catheter. CONTRAST:  15 cc MultiHance IV. COMPARISON:  04/05/2017 CT abdomen/pelvis. FINDINGS: Urinary Tract:  Normal bladder. Bowel: Status post low anterior resection with colo-anal anastomosis. There is an irregular 5.6 x 2.8 x 4.7 cm lower presacral space abscess with thick enhancing wall and internal gas, which demonstrates four separate sites of fistulous communication with the distal sigmoid colon and colo-anal anastomosis, most superiorly with a 1.2 cm fistula to the sigmoid colon in the left deep pelvis at the level of the superior bladder (series 4/image 14), a 1.6 cm left-sided fistula to the sigmoid colon at the level of the mid bladder (series 4/image 27), a short 0.5 cm fistula to the distal sigmoid colon in the right lower deep pelvis at the level of the mid prostate (series 4/image 33) and lastly a 1.1 cm fistula between the posterior  superior margin of the colo-anal anastomosis and the abscess (series 3/image 26). The administered ultrasound gel freely passes into the presacral abscess. The presacral abscess previously measured 4.9 x 3.0 x 4.6 cm on 04/05/2017 using similar measurement technique, not significantly changed in size. No new bowel wall thickening or discrete mass is seen at the colo-anal anastomosis or in the presacral space. Fat stranding and ill-defined fluid surrounding the presacral space abscess posteriorly is not appreciably changed. Partially visualized loop ileostomy in the ventral right abdominal wall. Vascular/Lymphatic:  No acute vascular abnormality. No pathologically enlarged pelvic lymph nodes. Reproductive: Normal size prostate. No prostatic fluid collections. Asymmetric mild thickening of the left seminal vesicle, probably reactive due to the adjacent fistula between the sigmoid colon and presacral space abscess. Other:  No ascites. Musculoskeletal: No aggressive appearing focal osseous lesions. IMPRESSION: 1. No appreciable change in chronic complex presacral space abscess, which demonstrates four separate sites of fistulous communication to the sigmoid colon and colo-anal anastomosis, as detailed. No discrete mass or other findings to suggest local tumor recurrence. 2. No evidence of metastatic disease in the pelvis. Electronically Signed   By: Ilona Sorrel M.D.   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  S/P robotic assisted APR with coloanal anastomosis and DLI by Dr. Edwyna Perfect at [facility] on 06/07/2016.  This was complicated by a posterior anastomotic dehiscence with pelvic abscess.   PLAN: -Continue follow up for evaluation/treatment of his presacral abscess  and new fistulas at [facility]. Patient has an upcoming appointment on 05/18/17. -CBC, CMP, PT/PTT and EKGs have all been performed today per the request of [facility]. We will send it over. -No evidence of recurrence on his scans. -RTC in 4 months for follow up with labs, CBC, CMP, CEA.  THERAPY PLAN:  NCCN guidelines regarding surveillance for rectal cancer are as follows (1.2018):  1. Stage I with full surgical staging:   A. Colonoscopy 1 year    1. If advanced adenoma, repeat in 1 year.    2. If no advanced adenoma, repeat in 3 years, then every 5 years  2. Stage II, III:   A. History and physical every 3-6 months for 2 years, then every 6 months for a total of 5 years.   B. CEA every 3-6 months for 2 years, then every 6 months for a total of 5 years.   C. Chest/abdominal/pelvic CT every 6-12 months (category 2b for frequency less than 12 months) for a total of 5 years.   D. Colonoscopy in 1 year except if no preoperative colonoscopy due to obstructing lesion, colonoscopy in 3-6 months.    1. If advanced adenoma, repeat in 1 year    2. If no advanced adenoma, repeat in 3 years, then every 5 years.   E. PET/CT scan is not recommended.  3. Stage IV:   A. History and physical every 3-6 months for 2 years, then every 6 months for a total of 5 years.   B. CEA every 3-6 months for 2 years, then every 6 months for a total of 5 years.   C. Chest/abdominal/pelvic CT every 3-6 months (category 2b for frequency less than 6 months) for 2 years, then every 6-12 months for a total of 5 years.   D. Colonoscopy in 1 year except if no preoperative colonoscopy due to obstructing lesion, colonoscopy in 3-6 months.    1. If advanced adenoma, repeat in 1 year    2. If no advanced adenoma, repeat in 3 years, then every 5 years.   E. PET/CT scan is not recommended.        All questions were answered. The patient knows to call the clinic with any problems, questions or concerns. We can certainly see  the patient much sooner if necessary.  Patient and plan discussed with Dr. Twana First and she is in agreement with the aforementioned.   This note is electronically signed by: Twana First, MD 05/15/2017 4:05 PM

## 2017-05-23 MED ORDER — GADOBENATE DIMEGLUMINE 529 MG/ML IV SOLN
20.0000 mL | Freq: Once | INTRAVENOUS | Status: AC | PRN
  Administered 2017-05-23: 16 mL via INTRAVENOUS

## 2017-05-23 NOTE — Addendum Note (Signed)
Encounter addended by: Rush Barer on: 05/23/2017  8:28 AM<BR>    Actions taken: Imaging Exam ended, Order list changed, MAR administration accepted

## 2017-05-30 ENCOUNTER — Other Ambulatory Visit (HOSPITAL_COMMUNITY): Payer: Self-pay | Admitting: Emergency Medicine

## 2017-05-30 MED ORDER — ALPRAZOLAM 1 MG PO TABS: 1.0000 mg | ORAL_TABLET | Freq: Two times a day (BID) | ORAL | 1 refills | Status: DC | PRN

## 2017-05-30 NOTE — Progress Notes (Signed)
Xanax refilled.  

## 2017-06-26 ENCOUNTER — Other Ambulatory Visit (HOSPITAL_COMMUNITY): Payer: Self-pay | Admitting: Oncology

## 2017-06-26 ENCOUNTER — Encounter (HOSPITAL_COMMUNITY): Payer: Self-pay | Admitting: Oncology

## 2017-06-26 ENCOUNTER — Encounter (HOSPITAL_COMMUNITY): Payer: BLUE CROSS/BLUE SHIELD | Attending: Oncology | Admitting: Oncology

## 2017-06-26 VITALS — BP 138/82 | HR 91 | Temp 98.4°F | Resp 18 | Ht 70.0 in | Wt 179.7 lb

## 2017-06-26 DIAGNOSIS — R5383 Other fatigue: Secondary | ICD-10-CM | POA: Diagnosis not present

## 2017-06-26 DIAGNOSIS — F419 Anxiety disorder, unspecified: Secondary | ICD-10-CM

## 2017-06-26 DIAGNOSIS — N529 Male erectile dysfunction, unspecified: Secondary | ICD-10-CM

## 2017-06-26 DIAGNOSIS — C20 Malignant neoplasm of rectum: Secondary | ICD-10-CM | POA: Diagnosis not present

## 2017-06-26 MED ORDER — CAPECITABINE 500 MG PO TABS: ORAL_TABLET | ORAL | 0 refills | Status: DC

## 2017-06-26 NOTE — Patient Instructions (Signed)
[place] at [facility] Discharge Instructions  RECOMMENDATIONS MADE BY THE CONSULTANT AND ANY TEST RESULTS WILL BE SENT TO YOUR REFERRING PHYSICIAN.  You were seen today by Dr. Twana First You will receive the Xeloda in the mail within the next 2 weeks, only take it on the days your have radiation    Thank you for choosing [place] at [facility] to provide your oncology and hematology care.  To afford each patient quality time with our provider, please arrive at least 15 minutes before your scheduled appointment time.    If you have a lab appointment with the [place] please come in thru the  Main Entrance and check in at the main information desk  You need to re-schedule your appointment should you arrive 10 or more minutes late.  We strive to give you quality time with our providers, and arriving late affects you and other patients whose appointments are after yours.  Also, if you no show three or more times for appointments you may be dismissed from the clinic at the providers discretion.     Again, thank you for choosing [facility].  Our hope is that these requests will decrease the amount of time that you wait before being seen by our physicians.       _____________________________________________________________  Should you have questions after your visit to [facility], please contact our office at [phone] between the hours of 8:30 a.m. and 4:30 p.m.  Voicemails left after 4:30 p.m. will not be returned until the following business day.  For prescription refill requests, have your pharmacy contact our office.       Resources For Cancer Patients and their Caregivers ? American Cancer Society: Can assist with transportation, wigs, general needs, runs Look Good Feel Better.        386 365 6080 ? Cancer Care: Provides financial assistance, online support groups, medication/co-pay assistance.   1-800-813-HOPE 732-866-4342) ? [place] Assists [place] cancer patients and their families through emotional , educational and financial support.  620-199-7818 ? [place] DSS Where to apply for food stamps, Medicaid and utility assistance. (774)843-9309 ? [facility]: Transportation to medical appointments. 747-810-0830 ? Social Security Administration: May apply for disability if have a Stage IV cancer. 909 052 2825 807 718 5723 ? [employer], Disability and Transit Services: Assists with nutrition, care and transit needs. [place] Support Programs: @10RELATIVEDAYS @ > Cancer Support Group  2nd Tuesday of the month 1pm-2pm, Journey Room  > Creative Journey  3rd Tuesday of the month 1130am-1pm, Journey Room  > Look Good Feel Better  1st Wednesday of the month 10am-12 noon, Journey Room (Call [place] to register [phone])

## 2017-06-26 NOTE — Progress Notes (Signed)
Jonathan Prose, MD [address] [place]  [address]  Rectal cancer [facility])  CURRENT THERAPY: Surveillance per NCCN guidelines  INTERVAL HISTORY: Jonathan Wright 51 y.o. 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  S/P robotic assisted APR with coloanal anastomosis and DLI by Dr. Edwyna Perfect at [facility] on 06/07/2016.  This was complicated by a posterior anastomotic dehiscence with pelvic abscess.   Oncology History   Stage IIIB rectal adenocarcinoma, mT2N2M0 Treated with 5Gy x 5 at [facility]     Rectal cancer [facility])   09/20/2015 Procedure    colonoscopy with firm rectal mass and 4 cm polyp in proximal rectum, infiltrating non-obstructing large mass within the distal rectum extending towards anal verge      09/20/2015 Miscellaneous    Microsatellite stable.       09/28/2015 Imaging    CT C/A/P no evidence of distant metastatic disease. irregular thickening of the low rectum and anal canal, several enlarged perirectal LN, indeterminate 68m RUL nodule      09/28/2015 Imaging    MRI pelvis assym diffuse circum lobular thickening of rectum, near full wall thickness extension along lower anterior rectum W/O extra serosal extension c/w known adeno, mult. perirectal LN      12/06/2015 - 12/10/2015 Radiation Therapy    [facility] Dr. AAngelina Ok  2500.00 cGy, 500.00 cGy per day in 5 fractions delivered 1x per day to the 96.0% Isodose line Treatment Dates: 12/06/2015 through 12/10/2015.       01/03/2016 Procedure    PICC placed      01/04/2016 - 03/14/2016 Chemotherapy    FOLFOX x 6 cycles      06/07/2016 Definitive Surgery    Robotic assisted APR with coloanal anastomosis and [facility]     06/19/2016 -  06/23/2016 Hospital Admission    Admit date: 06/19/2016 Admission diagnosis: Sepsis Additional comments: Sepsis due to multiple gram-negative active uremia. Likely [facility] general surgery following, perirectal abscess draining by itself, white count and temperatures improved. He had a PICC line from prior to this admission which was line removed on 06/21/2016, vancomycin stopped on 06/22/2016, He tolerated test dose of Rocephin well on 06/22/2016 without any issues, [facility] on [facility], requested to follow with PCP in 3 days to make sure he is tolerating oral medications well and has continued to defervesce, request PCP to please check final culture and sensitivity results which should be back in 3 days. He will follow with general surgery outpatient as well.      02/15/2017 Imaging    CT CAP- 1. Well-formed presacral abscess is decreased in size but persistent. 2. No evidence of colorectal cancer metastasis in the abdomen pelvis. 3. Loop ileostomy in the RIGHT lower quadrant.      04/05/2017 Imaging    CT CAP-IMPRESSION: 1. Status post loop right-sided ileostomy with sigmoid to anal anastomosis. 2. Decrease in presacral fluid collection, consistent with abscess. New foci of extraluminal gas, suspicious for fistulous communication to the lower sigmoid. 3. No evidence of metastatic disease.      04/20/2017 Imaging    MRI  pelvis w/ and w/o contrast IMPRESSION: 1. No appreciable change in chronic complex presacral space abscess, which demonstrates four separate sites of fistulous communication to the sigmoid colon and colo-anal anastomosis, as detailed. No discrete mass or other findings to suggest local tumor recurrence. 2. No evidence of metastatic disease in the pelvis.        05/15/17: Patient presents for continued follow up. He continues to see  [facility] for ongoing management of his presacral abscess. He recently had reimaging performed with CT C/A/P on 04/05/17 and MRI pelvis on 04/20/17. 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 Patient denies any pain. However he does states he occasionally has an odd feeling when he sits down and occasionally feels pressure when he urinates. He states he no longer has erections. He states his appetite is the same and denies any weight loss. Denies any chest pain, shortness of breath, abdominal pain. No recent fevers/chills.  06/26/17: Patient presents today for unscheduled follow-up.  I was called by his radiation oncologist Dr. Ysidro Evert at [facility] this morning to discuss plans regarding his recurrent rectal cancer.  There are plans for multi-modality treatment starting off with external beam radiation at [facility] with 17 treatments and then potentially surgery +/- more RT at [facility].  I was called this morning to discuss concurrent Xeloda while he gets his external beam radiation.  Patient states that overall he has been doing the same.  He has been on antibiotics for the past 2 weeks with ciprofloxacin and Flagyl for treatment of his presacral abscess.  He denies any fevers or chills.  He denies any chest pain, shortness of breath, abdominal pain, nausea, vomiting, diarrhea.  He expresses anxiety about telling his children about his recurrence.  He continues to have fatigue. Continues to have erectile dysfunction.  Review of Systems  Constitutional: Positive for malaise/fatigue. Negative for chills, fever and weight loss.  HENT: Negative.   Eyes: Negative.   Respiratory: Negative.  Negative for cough.   Cardiovascular: Negative.  Negative for chest pain.  Gastrointestinal: Negative.  Negative for blood in stool, constipation, diarrhea, melena, nausea and vomiting.  Genitourinary: Negative.   Musculoskeletal: Negative.   Skin: Negative.     Neurological: Negative.  Negative for weakness.  Endo/Heme/Allergies: Negative.   Psychiatric/Behavioral: Negative.     Past Medical History:  Diagnosis Date  . Anxiety   . Depression   . Rectal cancer ([place]) 12/17/2015    Past Surgical History:  Procedure Laterality Date  . ABDOMINAL PERINEAL BOWEL RESECTION  06/07/2016   [facility]  . COLON SURGERY    . DIVERTING ILEOSTOMY  06/07/2016   [facility]  . LAPAROSCOPIC LOW ANTERIOR RESCECTION WITH COLOANAL ANASTOMOSIS  06/07/2016   [facility]  . PERIPHERALLY INSERTED CENTRAL CATHETER INSERTION  11/2015    Family History  Problem Relation Age of Onset  . Rectal cancer Neg Hx     Social History   Socioeconomic History  . Marital status: Divorced    Spouse name: None  . Number of children: None  . Years of education: None  . Highest education level: None  Social Needs  . Financial resource strain: None  . Food insecurity - worry: None  . Food insecurity - inability: None  . Transportation needs - medical: None  . Transportation needs - non-medical: None  Occupational History  . None  Tobacco Use  . Smoking status: Never Smoker  . Smokeless tobacco: Never Used  Substance and Sexual Activity  . Alcohol use: Yes    Comment: occ.   . Drug use: No  . Sexual activity: None  Other Topics Concern  . None  Social History Narrative  . None     PHYSICAL EXAMINATION  ECOG PERFORMANCE STATUS: 1 - Symptomatic but completely ambulatory  Vitals:   06/26/17 1111  BP: 138/82  Pulse: 91  Resp: 18  Temp: 98.4 F (36.9 C)  SpO2: 100%    Constitutional: Well-developed, well-nourished, and in no distress.   HENT:  Head: Normocephalic and atraumatic.  Mouth/Throat: No oropharyngeal exudate. Mucosa moist. Eyes: Pupils are equal, round, and reactive to light. Conjunctivae are normal. No scleral icterus.  Neck: Normal range of motion. Neck supple. No JVD present.  Cardiovascular: Normal rate, regular rhythm and  normal heart sounds.  Exam reveals no gallop and no friction rub.   No murmur heard. Pulmonary/Chest: Effort normal and breath sounds normal. No respiratory distress. No wheezes.No rales.  Abdominal: Soft. Bowel sounds are normal. No distension. There is no tenderness. There is no guarding. +RLQ ostomy.  Musculoskeletal: No edema or tenderness.  Lymphadenopathy:    No cervical or supraclavicular adenopathy.  Neurological: Alert and oriented to person, place, and time. No cranial nerve deficit.  Skin: Skin is warm and dry. No rash noted. No erythema. No pallor.  Psychiatric: Affect and judgment normal.     LABORATORY DATA: CBC    Component Value Date/Time   WBC 5.1 05/15/2017 1501   RBC 4.90 05/15/2017 1501   HGB 14.2 05/15/2017 1501   HCT 43.3 05/15/2017 1501   PLT 223 05/15/2017 1501   MCV 88.4 05/15/2017 1501   MCH 29.0 05/15/2017 1501   MCHC 32.8 05/15/2017 1501   RDW 16.5 (H) 05/15/2017 1501   LYMPHSABS 1.2 05/15/2017 1501   MONOABS 0.7 05/15/2017 1501   EOSABS 0.2 05/15/2017 1501   BASOSABS 0.0 05/15/2017 1501      Chemistry      Component Value Date/Time   NA 138 05/15/2017 1501   K 4.2 05/15/2017 1501   CL 104 05/15/2017 1501   CO2 27 05/15/2017 1501   BUN 9 05/15/2017 1501   CREATININE 0.90 05/15/2017 1501      Component Value Date/Time   CALCIUM 9.6 05/15/2017 1501   ALKPHOS 73 05/15/2017 1501   AST 25 05/15/2017 1501   ALT 29 05/15/2017 1501   BILITOT 0.6 05/15/2017 1501     Lab Results  Component Value Date   CEA 2.6 02/12/2017   Lab Results  Component Value Date   INR 0.95 05/15/2017   INR 1.00 03/12/2017   INR 1.14 06/20/2016   Results for SAMANYU, TINNELL (MRN [id number]) as of 03/12/2017 11:13  Ref. Range 03/12/2017 10:10  Prothrombin Time Latest Ref Range: 11.4 - 15.2 seconds 13.0  INR Unknown 1.00  APTT Latest Ref Range: 24 - 36 seconds 25    PENDING LABS:   RADIOGRAPHIC STUDIES:  No results found. 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  S/P robotic assisted APR with coloanal anastomosis and DLI by Dr. Edwyna Perfect at [facility] on 06/07/2016.  This was complicated by a posterior anastomotic dehiscence with pelvic abscess.  Patient now with recurrence at the anastomotic site with pelvic abscess.  PLAN: -The  plan is for external beam radiation at [facility] with 17 treatments and then potentially surgery +/- more RT at [facility].   -I have discussed xeloda 847m/m2 PO BID on the days that he gets his 17 radiation treatments. I have discussed side effects of xeloda in detail with the patient and also have provided reading material on xeloda for the patient.  -Tentative start date for his chemoRT is July 05, 2017. Hopefully he will get his xeloda delivered to him by then. -RTC on 07/10/17 and 07/19/17 for follow up and to assess tolerability to concurrent treatment with xeloda/RT.  THERAPY PLAN:  NCCN guidelines regarding surveillance for rectal cancer are as follows (1.2018):  1. Stage I with full surgical staging:   A. Colonoscopy 1 year    1. If advanced adenoma, repeat in 1 year.    2. If no advanced adenoma, repeat in 3 years, then every 5 years  2. Stage II, III:   A. History and physical every 3-6 months for 2 years, then every 6 months for a total of 5 years.   B. CEA every 3-6 months for 2 years, then every 6 months for a total of 5 years.   C. Chest/abdominal/pelvic CT every 6-12 months (category 2b for frequency less than 12 months) for a total of 5 years.   D. Colonoscopy in 1 year except if no preoperative colonoscopy due to obstructing lesion, colonoscopy in 3-6 months.    1. If advanced adenoma, repeat in 1 year    2. If no advanced adenoma, repeat in 3 years, then every 5  years.   E. PET/CT scan is not recommended.  3. Stage IV:   A. History and physical every 3-6 months for 2 years, then every 6 months for a total of 5 years.   B. CEA every 3-6 months for 2 years, then every 6 months for a total of 5 years.   C. Chest/abdominal/pelvic CT every 3-6 months (category 2b for frequency less than 6 months) for 2 years, then every 6-12 months for a total of 5 years.   D. Colonoscopy in 1 year except if no preoperative colonoscopy due to obstructing lesion, colonoscopy in 3-6 months.    1. If advanced adenoma, repeat in 1 year    2. If no advanced adenoma, repeat in 3 years, then every 5 years.   E. PET/CT scan is not recommended.        All questions were answered. The patient knows to call the clinic with any problems, questions or concerns. We can certainly see the patient much sooner if necessary.   This note is electronically signed by: LTwana First MD 06/26/2017 12:34 PM

## 2017-06-29 ENCOUNTER — Telehealth (HOSPITAL_COMMUNITY): Payer: Self-pay | Admitting: Oncology

## 2017-07-04 ENCOUNTER — Telehealth (HOSPITAL_COMMUNITY): Payer: Self-pay | Admitting: Emergency Medicine

## 2017-07-04 NOTE — Telephone Encounter (Signed)
Left message for pt to call RN back about Xeloda.  If he has received it and when his start date for radiation is.

## 2017-07-05 ENCOUNTER — Ambulatory Visit (HOSPITAL_COMMUNITY): Payer: BLUE CROSS/BLUE SHIELD | Admitting: Adult Health

## 2017-07-05 ENCOUNTER — Other Ambulatory Visit (HOSPITAL_COMMUNITY): Payer: BLUE CROSS/BLUE SHIELD

## 2017-07-10 ENCOUNTER — Ambulatory Visit (HOSPITAL_COMMUNITY): Payer: Self-pay

## 2017-07-11 ENCOUNTER — Other Ambulatory Visit (HOSPITAL_COMMUNITY): Payer: Self-pay | Admitting: Oncology

## 2017-07-17 ENCOUNTER — Telehealth (HOSPITAL_COMMUNITY): Payer: Self-pay | Admitting: *Deleted

## 2017-07-19 ENCOUNTER — Encounter (HOSPITAL_COMMUNITY): Payer: Self-pay | Admitting: Oncology

## 2017-07-19 ENCOUNTER — Other Ambulatory Visit: Payer: Self-pay

## 2017-07-19 ENCOUNTER — Encounter (HOSPITAL_COMMUNITY): Payer: BLUE CROSS/BLUE SHIELD

## 2017-07-19 ENCOUNTER — Encounter (HOSPITAL_BASED_OUTPATIENT_CLINIC_OR_DEPARTMENT_OTHER): Payer: BLUE CROSS/BLUE SHIELD | Admitting: Oncology

## 2017-07-19 VITALS — BP 118/78 | HR 90 | Temp 97.5°F | Resp 18 | Wt 183.0 lb

## 2017-07-19 DIAGNOSIS — R5383 Other fatigue: Secondary | ICD-10-CM

## 2017-07-19 DIAGNOSIS — C20 Malignant neoplasm of rectum: Secondary | ICD-10-CM

## 2017-07-19 LAB — COMPREHENSIVE METABOLIC PANEL
ALT: 20 U/L (ref 17–63)
ANION GAP: 8 (ref 5–15)
AST: 23 U/L (ref 15–41)
Albumin: 3.9 g/dL (ref 3.5–5.0)
Alkaline Phosphatase: 77 U/L (ref 38–126)
BUN: 12 mg/dL (ref 6–20)
CALCIUM: 9.3 mg/dL (ref 8.9–10.3)
CHLORIDE: 105 mmol/L (ref 101–111)
CO2: 25 mmol/L (ref 22–32)
CREATININE: 1.16 mg/dL (ref 0.61–1.24)
Glucose, Bld: 112 mg/dL — ABNORMAL HIGH (ref 65–99)
POTASSIUM: 4 mmol/L (ref 3.5–5.1)
Sodium: 138 mmol/L (ref 135–145)
TOTAL PROTEIN: 7.4 g/dL (ref 6.5–8.1)
Total Bilirubin: 0.6 mg/dL (ref 0.3–1.2)

## 2017-07-19 LAB — CBC WITH DIFFERENTIAL/PLATELET
Basophils Absolute: 0 10*3/uL (ref 0.0–0.1)
Basophils Relative: 0 %
EOS ABS: 0.1 10*3/uL (ref 0.0–0.7)
EOS PCT: 2 %
HCT: 43.8 % (ref 39.0–52.0)
Hemoglobin: 13.9 g/dL (ref 13.0–17.0)
LYMPHS ABS: 1 10*3/uL (ref 0.7–4.0)
LYMPHS PCT: 14 %
MCH: 29.4 pg (ref 26.0–34.0)
MCHC: 31.7 g/dL (ref 30.0–36.0)
MCV: 92.6 fL (ref 78.0–100.0)
MONO ABS: 0.7 10*3/uL (ref 0.1–1.0)
MONOS PCT: 11 %
Neutro Abs: 4.9 10*3/uL (ref 1.7–7.7)
Neutrophils Relative %: 73 %
PLATELETS: 324 10*3/uL (ref 150–400)
RBC: 4.73 MIL/uL (ref 4.22–5.81)
RDW: 16.1 % — AB (ref 11.5–15.5)
WBC: 6.8 10*3/uL (ref 4.0–10.5)

## 2017-07-19 NOTE — Progress Notes (Signed)
Jonathan Prose, MD [address] [place] [address]  No diagnosis found.  CURRENT THERAPY: Surveillance per NCCN guidelines  INTERVAL HISTORY: Jonathan Wright 51 y.o. 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  S/P robotic assisted APR with coloanal anastomosis and DLI by Dr. Edwyna Perfect at [facility] on 06/07/2016.  This was complicated by a posterior anastomotic dehiscence with pelvic abscess.   Oncology History   Stage IIIB rectal adenocarcinoma, mT2N2M0 Treated with 5Gy x 5 at [facility]     Rectal cancer [facility])   09/20/2015 Procedure    colonoscopy with firm rectal mass and 4 cm polyp in proximal rectum, infiltrating non-obstructing large mass within the distal rectum extending towards anal verge      09/20/2015 Miscellaneous    Microsatellite stable.       09/28/2015 Imaging    CT C/A/P no evidence of distant metastatic disease. irregular thickening of the low rectum and anal canal, several enlarged perirectal LN, indeterminate 71m RUL nodule      09/28/2015 Imaging    MRI pelvis assym diffuse circum lobular thickening of rectum, near full wall thickness extension along lower anterior rectum W/O extra serosal extension c/w known adeno, mult. perirectal LN      12/06/2015 - 12/10/2015 Radiation Therapy    [facility] Dr. AAngelina Ok  2500.00 cGy, 500.00 cGy per day in 5 fractions delivered 1x per day to the 96.0% Isodose line Treatment Dates: 12/06/2015 through 12/10/2015.       01/03/2016 Procedure    PICC placed      01/04/2016 - 03/14/2016 Chemotherapy    FOLFOX x 6 cycles      06/07/2016 Definitive Surgery    Robotic assisted APR with coloanal [facility]     06/19/2016 -  06/23/2016 Hospital Admission    Admit date: 06/19/2016 Admission diagnosis: Sepsis Additional comments: Sepsis due to multiple gram-negative active uremia. Likely source [facility] general surgery following, perirectal abscess draining by itself, white count and temperatures improved. He had a PICC line from prior to this admission which was line removed on 06/21/2016, vancomycin stopped on 06/22/2016, He tolerated test dose of Rocephin well on 06/22/2016 without any issues, [facility], requested to follow with PCP in 3 days to make sure he is tolerating oral medications well and has continued to defervesce, request PCP to please check final culture and sensitivity results which should be back in 3 days. He will follow with general surgery outpatient as well.      02/15/2017 Imaging    CT CAP- 1. Well-formed presacral abscess is decreased in size but persistent. 2. No evidence of colorectal cancer metastasis in the abdomen pelvis. 3. Loop ileostomy in the RIGHT lower quadrant.      04/05/2017 Imaging    CT CAP-IMPRESSION: 1. Status post loop right-sided ileostomy with sigmoid to anal anastomosis. 2. Decrease in presacral fluid collection, consistent with abscess. New foci of extraluminal gas, suspicious for fistulous communication to the lower sigmoid. 3. No evidence of metastatic disease.      04/20/2017 Imaging    MRI  pelvis w/ and w/o contrast IMPRESSION: 1. No appreciable change in chronic complex presacral space abscess, which demonstrates four separate sites of fistulous communication to the sigmoid colon and colo-anal anastomosis, as detailed. No discrete mass or other findings to suggest local tumor recurrence. 2. No evidence of metastatic disease in the pelvis.        05/15/17: Patient presents for continued follow up. He continues to see  [facility] for ongoing management of his presacral abscess. He recently had reimaging performed with CT C/A/P on 04/05/17 and MRI pelvis on 04/20/17. 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 Patient denies any pain. However he does states he occasionally has an odd feeling when he sits down and occasionally feels pressure when he urinates. He states he no longer has erections. He states his appetite is the same and denies any weight loss. Denies any chest pain, shortness of breath, abdominal pain. No recent fevers/chills.  06/26/17: Patient presents today for unscheduled follow-up.  I was called by his radiation oncologist Dr. Ysidro Evert at [facility] this morning to discuss plans regarding his recurrent rectal cancer.  There are plans for multi-modality treatment starting off with external beam radiation at [facility] with 17 treatments and then potentially surgery +/- more RT at [facility].  I was called this morning to discuss concurrent Xeloda while he gets his external beam radiation.  Patient states that overall he has been doing the same.  He has been on antibiotics for the past 2 weeks with ciprofloxacin and Flagyl for treatment of his presacral abscess.  He denies any fevers or chills.  He denies any chest pain, shortness of breath, abdominal pain, nausea, vomiting, diarrhea.  He expresses anxiety about telling his children about his recurrence.  He continues to have fatigue. Continues to have erectile dysfunction.  07/19/17:Patient presents today for continue follow-up.  He is received 7 radiation treatments to date out of planned 17 days of radiation.  He has been taking Xeloda along with the radiation and states that he has been tolerating it well.  He is not noted any side effects from the Xeloda.  He has some fatigue but otherwise has no complaints today.  Review of Systems  Constitutional: Positive for malaise/fatigue. Negative for chills, fever and  weight loss.  HENT: Negative.   Eyes: Negative.   Respiratory: Negative.  Negative for cough.   Cardiovascular: Negative.  Negative for chest pain.  Gastrointestinal: Negative.  Negative for blood in stool, constipation, diarrhea, melena, nausea and vomiting.  Genitourinary: Negative.   Musculoskeletal: Negative.   Skin: Negative.   Neurological: Negative.  Negative for weakness.  Endo/Heme/Allergies: Negative.   Psychiatric/Behavioral: Negative.     Past Medical History:  Diagnosis Date  . Anxiety   . Depression   . Rectal cancer ([place]) 12/17/2015    Past Surgical History:  Procedure Laterality Date  . ABDOMINAL PERINEAL BOWEL RESECTION  06/07/2016   [facility]  . COLON SURGERY    . DIVERTING ILEOSTOMY  06/07/2016   [facility]  . LAPAROSCOPIC LOW ANTERIOR RESCECTION WITH COLOANAL ANASTOMOSIS  06/07/2016   [facility]  . PERIPHERALLY INSERTED CENTRAL CATHETER INSERTION  11/2015    Family History  Problem Relation Age of Onset  . Rectal cancer Neg Hx     Social History   Socioeconomic History  . Marital status: Divorced    Spouse name: None  . Number of children: None  . Years of education: None  .  Highest education level: None  Social Needs  . Financial resource strain: None  . Food insecurity - worry: None  . Food insecurity - inability: None  . Transportation needs - medical: None  . Transportation needs - non-medical: None  Occupational History  . None  Tobacco Use  . Smoking status: Never Smoker  . Smokeless tobacco: Never Used  Substance and Sexual Activity  . Alcohol use: Yes    Comment: occ.   . Drug use: No  . Sexual activity: None  Other Topics Concern  . None  Social History Narrative  . None     PHYSICAL EXAMINATION  ECOG PERFORMANCE STATUS: 1 - Symptomatic but completely ambulatory  Vitals:   07/19/17 1433  BP: 118/78  Pulse: 90  Resp: 18  Temp: (!) 97.5 F (36.4 C)  SpO2: 100%    Constitutional: Well-developed,  well-nourished, and in no distress.   HENT:  Head: Normocephalic and atraumatic.  Mouth/Throat: No oropharyngeal exudate. Mucosa moist. Eyes: Pupils are equal, round, and reactive to light. Conjunctivae are normal. No scleral icterus.  Neck: Normal range of motion. Neck supple. No JVD present.  Cardiovascular: Normal rate, regular rhythm and normal heart sounds.  Exam reveals no gallop and no friction rub.   No murmur heard. Pulmonary/Chest: Effort normal and breath sounds normal. No respiratory distress. No wheezes.No rales.  Abdominal: Soft. Bowel sounds are normal. No distension. There is no tenderness. There is no guarding. +RLQ ostomy.  Musculoskeletal: No edema or tenderness.  Lymphadenopathy:    No cervical or supraclavicular adenopathy.  Neurological: Alert and oriented to person, place, and time. No cranial nerve deficit.  Skin: Skin is warm and dry. No rash noted. No erythema. No pallor.  Psychiatric: Affect and judgment normal.     LABORATORY DATA: CBC    Component Value Date/Time   WBC 6.8 07/19/2017 1404   RBC 4.73 07/19/2017 1404   HGB 13.9 07/19/2017 1404   HCT 43.8 07/19/2017 1404   PLT 324 07/19/2017 1404   MCV 92.6 07/19/2017 1404   MCH 29.4 07/19/2017 1404   MCHC 31.7 07/19/2017 1404   RDW 16.1 (H) 07/19/2017 1404   LYMPHSABS 1.0 07/19/2017 1404   MONOABS 0.7 07/19/2017 1404   EOSABS 0.1 07/19/2017 1404   BASOSABS 0.0 07/19/2017 1404      Chemistry      Component Value Date/Time   NA 138 07/19/2017 1404   K 4.0 07/19/2017 1404   CL 105 07/19/2017 1404   CO2 25 07/19/2017 1404   BUN 12 07/19/2017 1404   CREATININE 1.16 07/19/2017 1404      Component Value Date/Time   CALCIUM 9.3 07/19/2017 1404   ALKPHOS 77 07/19/2017 1404   AST 23 07/19/2017 1404   ALT 20 07/19/2017 1404   BILITOT 0.6 07/19/2017 1404     Lab Results  Component Value Date   CEA 2.6 02/12/2017   Lab Results  Component Value Date   INR 0.95 05/15/2017   INR 1.00  03/12/2017   INR 1.14 06/20/2016   Results for DINARI, STGERMAINE (MRN [id number]) as of 03/12/2017 11:13  Ref. Range 03/12/2017 10:10  Prothrombin Time Latest Ref Range: 11.4 - 15.2 seconds 13.0  INR Unknown 1.00  APTT Latest Ref Range: 24 - 36 seconds 25    PENDING LABS:   RADIOGRAPHIC STUDIES:  No results found. 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  S/P robotic assisted APR with coloanal anastomosis and DLI by Dr. Edwyna Perfect at [facility] on 06/07/2016.  This was complicated by a posterior anastomotic dehiscence with pelvic abscess.  Patient now with recurrence at the anastomotic site with pelvic abscess.  PLAN: -The plan is for external beam radiation at [facility] with 17 treatments and then potentially surgery +/- more RT at [place] 826m/m2 PO BID on the days that he gets his 17 radiation treatments. He is tolerating treatment well. -RTC in 2 weeks for follow up with labs.   Orders Placed This Encounter  Procedures  . CBC with Differential    Standing Status:   Future    Standing Expiration Date:   07/19/2018  . Comprehensive metabolic panel    Standing Status:   Future    Standing Expiration Date:   07/19/2018    THERAPY PLAN:  NCCN guidelines regarding surveillance for rectal cancer are as follows (1.2018):  1. Stage I with full surgical staging:   A. Colonoscopy 1 year    1. If advanced adenoma, repeat in 1 year.    2. If no advanced adenoma, repeat in 3 years, then every 5 years  2. Stage II, III:   A. History and physical every 3-6 months for 2 years, then every 6 months for a total of 5 years.   B. CEA every 3-6 months for 2 years, then every 6 months for a total of 5 years.   C. Chest/abdominal/pelvic CT every 6-12  months (category 2b for frequency less than 12 months) for a total of 5 years.   D. Colonoscopy in 1 year except if no preoperative colonoscopy due to obstructing lesion, colonoscopy in 3-6 months.    1. If advanced adenoma, repeat in 1 year    2. If no advanced adenoma, repeat in 3 years, then every 5 years.   E. PET/CT scan is not recommended.  3. Stage IV:   A. History and physical every 3-6 months for 2 years, then every 6 months for a total of 5 years.   B. CEA every 3-6 months for 2 years, then every 6 months for a total of 5 years.   C. Chest/abdominal/pelvic CT every 3-6 months (category 2b for frequency less than 6 months) for 2 years, then every 6-12 months for a total of 5 years.   D. Colonoscopy in 1 year except if no preoperative colonoscopy due to obstructing lesion, colonoscopy in 3-6 months.    1. If advanced adenoma, repeat in 1 year    2. If no advanced adenoma, repeat in 3 years, then every 5 years.   E. PET/CT scan is not recommended.        All questions were answered. The patient knows to call the clinic with any problems, questions or concerns. We can certainly see the patient much sooner if necessary.   This note is electronically signed by: LTwana First MD 07/19/2017 2:53 PM

## 2017-07-19 NOTE — Patient Instructions (Signed)
[facility] at [facility] Discharge Instructions  RECOMMENDATIONS MADE BY THE CONSULTANT AND ANY TEST RESULTS WILL BE SENT TO YOUR REFERRING PHYSICIAN.  You were seen today by Dr. Louise Zhou    Thank you for choosing [facility] at [facility] to provide your oncology and hematology care.  To afford each patient quality time with our provider, please arrive at least 15 minutes before your scheduled appointment time.    If you have a lab appointment with the Cancer Center please come in thru the  Main Entrance and check in at the main information desk  You need to re-schedule your appointment should you arrive 10 or more minutes late.  We strive to give you quality time with our providers, and arriving late affects you and other patients whose appointments are after yours.  Also, if you no show three or more times for appointments you may be dismissed from the clinic at the providers discretion.     Again, thank you for choosing  Cancer Center.  Our hope is that these requests will decrease the amount of time that you wait before being seen by our physicians.       _____________________________________________________________  Should you have questions after your visit to  Cancer Center, please contact our office at [phone] between the hours of 8:30 a.m. and 4:30 p.m.  Voicemails left after 4:30 p.m. will not be returned until the following business day.  For prescription refill requests, have your pharmacy contact our office.       Resources For Cancer Patients and their Caregivers ? American Cancer Society: Can assist with transportation, wigs, general needs, runs Look Good Feel Better.        1-888-227-6333 ? Cancer Care: Provides financial assistance, online support groups, medication/co-pay assistance.  1-800-813-HOPE (4673) ? [facility] Assists [place] cancer patients and their  families through emotional , educational and financial support.  336-427-4357 ? [place] DSS Where to apply for food stamps, Medicaid and utility assistance. 336-342-1394 ? [facility]: Transportation to medical appointments. 336-347-2287 ? Social Security Administration: May apply for disability if have a Stage IV cancer. 336-342-7796 1-800-772-1213 ? [place] Aging, Disability and Transit Services: Assists with nutrition, care and transit needs. 336-349-2343  Cancer Center Support Programs: @10RELATIVEDAYS@ > Cancer Support Group  2nd Tuesday of the month 1pm-2pm, Journey Room  > Creative Journey  3rd Tuesday of the month 1130am-1pm, Journey Room  > Look Good Feel Better  1st Wednesday of the month 10am-12 noon, Journey Room (Call American Cancer Society to register 1-800-395-5775)    

## 2017-07-20 LAB — CEA: CEA1: 3.7 ng/mL (ref 0.0–4.7)

## 2017-08-02 ENCOUNTER — Encounter (HOSPITAL_COMMUNITY): Payer: BLUE CROSS/BLUE SHIELD | Attending: Oncology

## 2017-08-02 ENCOUNTER — Other Ambulatory Visit: Payer: Self-pay

## 2017-08-02 ENCOUNTER — Encounter (HOSPITAL_COMMUNITY): Payer: BLUE CROSS/BLUE SHIELD | Admitting: Oncology

## 2017-08-02 ENCOUNTER — Encounter (HOSPITAL_COMMUNITY): Payer: Self-pay

## 2017-08-02 VITALS — BP 116/78 | HR 104 | Temp 98.1°F | Resp 18 | Ht 69.0 in | Wt 180.0 lb

## 2017-08-02 DIAGNOSIS — R300 Dysuria: Secondary | ICD-10-CM | POA: Diagnosis not present

## 2017-08-02 DIAGNOSIS — R5383 Other fatigue: Secondary | ICD-10-CM | POA: Diagnosis not present

## 2017-08-02 DIAGNOSIS — C20 Malignant neoplasm of rectum: Secondary | ICD-10-CM

## 2017-08-02 LAB — CBC WITH DIFFERENTIAL/PLATELET
Basophils Absolute: 0 10*3/uL (ref 0.0–0.1)
Basophils Relative: 1 %
Eosinophils Absolute: 0.1 10*3/uL (ref 0.0–0.7)
Eosinophils Relative: 2 %
HEMATOCRIT: 46.9 % (ref 39.0–52.0)
HEMOGLOBIN: 15 g/dL (ref 13.0–17.0)
LYMPHS ABS: 0.7 10*3/uL (ref 0.7–4.0)
LYMPHS PCT: 11 %
MCH: 30.2 pg (ref 26.0–34.0)
MCHC: 32 g/dL (ref 30.0–36.0)
MCV: 94.4 fL (ref 78.0–100.0)
MONOS PCT: 13 %
Monocytes Absolute: 0.8 10*3/uL (ref 0.1–1.0)
NEUTROS ABS: 4.3 10*3/uL (ref 1.7–7.7)
NEUTROS PCT: 73 %
Platelets: 176 10*3/uL (ref 150–400)
RBC: 4.97 MIL/uL (ref 4.22–5.81)
RDW: 17.1 % — ABNORMAL HIGH (ref 11.5–15.5)
WBC: 6 10*3/uL (ref 4.0–10.5)

## 2017-08-02 LAB — COMPREHENSIVE METABOLIC PANEL
ALK PHOS: 85 U/L (ref 38–126)
ALT: 17 U/L (ref 17–63)
ANION GAP: 7 (ref 5–15)
AST: 19 U/L (ref 15–41)
Albumin: 4.3 g/dL (ref 3.5–5.0)
BILIRUBIN TOTAL: 0.6 mg/dL (ref 0.3–1.2)
BUN: 15 mg/dL (ref 6–20)
CALCIUM: 9.4 mg/dL (ref 8.9–10.3)
CO2: 29 mmol/L (ref 22–32)
CREATININE: 0.96 mg/dL (ref 0.61–1.24)
Chloride: 102 mmol/L (ref 101–111)
Glucose, Bld: 106 mg/dL — ABNORMAL HIGH (ref 65–99)
Potassium: 3.6 mmol/L (ref 3.5–5.1)
SODIUM: 138 mmol/L (ref 135–145)
TOTAL PROTEIN: 7.5 g/dL (ref 6.5–8.1)

## 2017-08-02 LAB — URINALYSIS, ROUTINE W REFLEX MICROSCOPIC
Bilirubin Urine: NEGATIVE
Glucose, UA: NEGATIVE mg/dL
Hgb urine dipstick: NEGATIVE
Ketones, ur: NEGATIVE mg/dL
Leukocytes, UA: NEGATIVE
NITRITE: NEGATIVE
Protein, ur: NEGATIVE mg/dL
SPECIFIC GRAVITY, URINE: 1.026 (ref 1.005–1.030)
pH: 5 (ref 5.0–8.0)

## 2017-08-02 NOTE — Progress Notes (Signed)
Jonathan Prose, MD [address] [place] [address]  No diagnosis found.  CURRENT THERAPY: Surveillance per NCCN guidelines  INTERVAL HISTORY: Jonathan Wright 51 y.o. 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  S/P robotic assisted APR with coloanal anastomosis and DLI by Dr. Edwyna Perfect at [facility] on 06/07/2016.  This was complicated by a posterior anastomotic dehiscence with pelvic abscess.   Oncology History   Stage IIIB rectal adenocarcinoma, mT2N2M0 Treated with 5Gy x 5 at [facility]     Rectal cancer [facility])   09/20/2015 Procedure    colonoscopy with firm rectal mass and 4 cm polyp in proximal rectum, infiltrating non-obstructing large mass within the distal rectum extending towards anal verge      09/20/2015 Miscellaneous    Microsatellite stable.       09/28/2015 Imaging    CT C/A/P no evidence of distant metastatic disease. irregular thickening of the low rectum and anal canal, several enlarged perirectal LN, indeterminate 71m RUL nodule      09/28/2015 Imaging    MRI pelvis assym diffuse circum lobular thickening of rectum, near full wall thickness extension along lower anterior rectum W/O extra serosal extension c/w known adeno, mult. perirectal LN      12/06/2015 - 12/10/2015 Radiation Therapy    [facility] Dr. AAngelina Ok  2500.00 cGy, 500.00 cGy per day in 5 fractions delivered 1x per day to the 96.0% Isodose line Treatment Dates: 12/06/2015 through 12/10/2015.       01/03/2016 Procedure    PICC placed      01/04/2016 - 03/14/2016 Chemotherapy    FOLFOX x 6 cycles      06/07/2016 Definitive Surgery    Robotic assisted APR with coloanal [facility]     06/19/2016 -  06/23/2016 Hospital Admission    Admit date: 06/19/2016 Admission diagnosis: Sepsis Additional comments: Sepsis due to multiple gram-negative active uremia. Likely source [facility] general surgery following, perirectal abscess draining by itself, white count and temperatures improved. He had a PICC line from prior to this admission which was line removed on 06/21/2016, vancomycin stopped on 06/22/2016, He tolerated test dose of Rocephin well on 06/22/2016 without any issues, [facility], requested to follow with PCP in 3 days to make sure he is tolerating oral medications well and has continued to defervesce, request PCP to please check final culture and sensitivity results which should be back in 3 days. He will follow with general surgery outpatient as well.      02/15/2017 Imaging    CT CAP- 1. Well-formed presacral abscess is decreased in size but persistent. 2. No evidence of colorectal cancer metastasis in the abdomen pelvis. 3. Loop ileostomy in the RIGHT lower quadrant.      04/05/2017 Imaging    CT CAP-IMPRESSION: 1. Status post loop right-sided ileostomy with sigmoid to anal anastomosis. 2. Decrease in presacral fluid collection, consistent with abscess. New foci of extraluminal gas, suspicious for fistulous communication to the lower sigmoid. 3. No evidence of metastatic disease.      04/20/2017 Imaging    MRI  pelvis w/ and w/o contrast IMPRESSION: 1. No appreciable change in chronic complex presacral space abscess, which demonstrates four separate sites of fistulous communication to the sigmoid colon and colo-anal anastomosis, as detailed. No discrete mass or other findings to suggest local tumor recurrence. 2. No evidence of metastatic disease in the pelvis.        05/15/17: Patient presents for continued follow up. He continues to see  [facility] for ongoing management of his presacral abscess. He recently had reimaging performed with CT C/A/P on 04/05/17 and MRI pelvis on 04/20/17. 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 Patient denies any pain. However he does states he occasionally has an odd feeling when he sits down and occasionally feels pressure when he urinates. He states he no longer has erections. He states his appetite is the same and denies any weight loss. Denies any chest pain, shortness of breath, abdominal pain. No recent fevers/chills.  06/26/17: Patient presents today for unscheduled follow-up.  I was called by his radiation oncologist Dr. Ysidro Evert at [facility] this morning to discuss plans regarding his recurrent rectal cancer.  There are plans for multi-modality treatment starting off with external beam radiation at [facility] with 17 treatments and then potentially surgery +/- more RT at [facility].  I was called this morning to discuss concurrent Xeloda while he gets his external beam radiation.  Patient states that overall he has been doing the same.  He has been on antibiotics for the past 2 weeks with ciprofloxacin and Flagyl for treatment of his presacral abscess.  He denies any fevers or chills.  He denies any chest pain, shortness of breath, abdominal pain, nausea, vomiting, diarrhea.  He expresses anxiety about telling his children about his recurrence.  He continues to have fatigue. Continues to have erectile dysfunction.  07/19/17:Patient presents today for continue follow-up.  He is received 7 radiation treatments to date out of planned 17 days of radiation.  He has been taking Xeloda along with the radiation and states that he has been tolerating it well.  He is not noted any side effects from the Xeloda.  He has some fatigue but otherwise has no complaints today.  08/02/17: Patient presents today for continued follow up. He has completed his chemoRT yesterday.  He states he has tolerated his chemoRT well without any side effects. He does note some fatigue. He notes he has some dysuria and difficulty with the stream of his urine for the past few days. He denies any associated fevers/chills. Otherwise ROS is negative.  Review of Systems  Constitutional: Positive for malaise/fatigue. Negative for chills, fever and weight loss.  HENT: Negative.   Eyes: Negative.   Respiratory: Negative.  Negative for cough.   Cardiovascular: Negative.  Negative for chest pain.  Gastrointestinal: Negative.  Negative for blood in stool, constipation, diarrhea, melena, nausea and vomiting.  Genitourinary: Negative.   Musculoskeletal: Negative.   Skin: Negative.   Neurological: Negative.  Negative for weakness.  Endo/Heme/Allergies: Negative.   Psychiatric/Behavioral: Negative.     Past Medical History:  Diagnosis Date  . Anxiety   . Depression   . Rectal cancer ([place]) 12/17/2015    Past Surgical History:  Procedure Laterality Date  . ABDOMINAL PERINEAL BOWEL RESECTION  06/07/2016   [facility]  . COLON SURGERY    . DIVERTING ILEOSTOMY  06/07/2016   [facility]  . LAPAROSCOPIC LOW ANTERIOR RESCECTION WITH COLOANAL ANASTOMOSIS  06/07/2016   [facility]  .  PERIPHERALLY INSERTED CENTRAL CATHETER INSERTION  11/2015    Family History  Problem Relation Age of Onset  . Rectal cancer Neg Hx     Social History   Socioeconomic History  . Marital status: Divorced    Spouse name: Not on file  . Number of children: Not on file  . Years of education: Not on file  . Highest education level: Not on file  Social Needs  . Financial resource strain: Not on file  . Food insecurity - worry: Not on file  . Food insecurity - inability: Not on file  . Transportation needs - medical: Not on file  . Transportation needs - non-medical: Not on file  Occupational History  . Not on file  Tobacco Use  . Smoking status: Never Smoker  . Smokeless tobacco: Never Used    Substance and Sexual Activity  . Alcohol use: Yes    Comment: occ.   . Drug use: No  . Sexual activity: Not on file  Other Topics Concern  . Not on file  Social History Narrative  . Not on file     PHYSICAL EXAMINATION  ECOG PERFORMANCE STATUS: 1 - Symptomatic but completely ambulatory  Vitals:   08/02/17 1453  BP: 116/78  Pulse: (!) 104  Resp: 18  Temp: 98.1 F (36.7 C)  SpO2: 99%    Constitutional: Well-developed, well-nourished, and in no distress.   HENT:  Head: Normocephalic and atraumatic.  Mouth/Throat: No oropharyngeal exudate. Mucosa moist. Eyes: Pupils are equal, round, and reactive to light. Conjunctivae are normal. No scleral icterus.  Neck: Normal range of motion. Neck supple. No JVD present.  Cardiovascular: Normal rate, regular rhythm and normal heart sounds.  Exam reveals no gallop and no friction rub.   No murmur heard. Pulmonary/Chest: Effort normal and breath sounds normal. No respiratory distress. No wheezes.No rales.  Abdominal: Soft. Bowel sounds are normal. No distension. There is no tenderness. There is no guarding. +RLQ ostomy.  Musculoskeletal: No edema or tenderness.  Lymphadenopathy:    No cervical or supraclavicular adenopathy.  Neurological: Alert and oriented to person, place, and time. No cranial nerve deficit.  Skin: Skin is warm and dry. No rash noted. No erythema. No pallor.  Psychiatric: Affect and judgment normal.     LABORATORY DATA: CBC    Component Value Date/Time   WBC 6.0 08/02/2017 1407   RBC 4.97 08/02/2017 1407   HGB 15.0 08/02/2017 1407   HCT 46.9 08/02/2017 1407   PLT 176 08/02/2017 1407   MCV 94.4 08/02/2017 1407   MCH 30.2 08/02/2017 1407   MCHC 32.0 08/02/2017 1407   RDW 17.1 (H) 08/02/2017 1407   LYMPHSABS 0.7 08/02/2017 1407   MONOABS 0.8 08/02/2017 1407   EOSABS 0.1 08/02/2017 1407   BASOSABS 0.0 08/02/2017 1407      Chemistry      Component Value Date/Time   NA 138 07/19/2017 1404   K 4.0  07/19/2017 1404   CL 105 07/19/2017 1404   CO2 25 07/19/2017 1404   BUN 12 07/19/2017 1404   CREATININE 1.16 07/19/2017 1404      Component Value Date/Time   CALCIUM 9.3 07/19/2017 1404   ALKPHOS 77 07/19/2017 1404   AST 23 07/19/2017 1404   ALT 20 07/19/2017 1404   BILITOT 0.6 07/19/2017 1404     Lab Results  Component Value Date   CEA 2.6 02/12/2017   Lab Results  Component Value Date   INR 0.95 05/15/2017   INR 1.00  03/12/2017   INR 1.14 06/20/2016   Results for DEIGO, ALONSO (MRN [id number]) as of 03/12/2017 11:13  Ref. Range 03/12/2017 10:10  Prothrombin Time Latest Ref Range: 11.4 - 15.2 seconds 13.0  INR Unknown 1.00  APTT Latest Ref Range: 24 - 36 seconds 25    PENDING LABS:   RADIOGRAPHIC STUDIES:  No results found. 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  S/P robotic assisted APR with coloanal anastomosis and DLI by Dr. Edwyna Perfect at [facility] on 06/07/2016.  This was complicated by a posterior anastomotic dehiscence with pelvic abscess.  Patient now with recurrence at the anastomotic site with pelvic abscess.  PLAN: -Patient has completed external beam radiation at [facility] with 17 treatments with xeloda on 08/01/17. He may potentially have surgery +/- more RT at [facility].  I have asked him to let us know if he needs any labs and imaging orders prior to his follow up at [facility] and we will get them ordered per their recommendations. -RTC in 3 months for follow up with CBC, CMP.  -Check UA, UCx today. If he has a UTI, will order him abx.  Orders Placed This Encounter  Procedures  . Urine culture  . Urinalysis, Routine w reflex microscopic    Standing Status:   Future    Number of Occurrences:   1    Standing Expiration Date:    08/02/2018  . CBC with Differential    Standing Status:   Future    Standing Expiration Date:   08/02/2018  . Comprehensive metabolic panel    Standing Status:   Future    Standing Expiration Date:   08/02/2018    THERAPY PLAN:  NCCN guidelines regarding surveillance for rectal cancer are as follows (1.2018):  1. Stage I with full surgical staging:   A. Colonoscopy 1 year    1. If advanced adenoma, repeat in 1 year.    2. If no advanced adenoma, repeat in 3 years, then every 5 years  2. Stage II, III:   A. History and physical every 3-6 months for 2 years, then every 6 months for a total of 5 years.   B. CEA every 3-6 months for 2 years, then every 6 months for a total of 5 years.   C. Chest/abdominal/pelvic CT every 6-12 months (category 2b for frequency less than 12 months) for a total of 5 years.   D. Colonoscopy in 1 year except if no preoperative colonoscopy due to obstructing lesion, colonoscopy in 3-6 months.    1. If advanced adenoma, repeat in 1 year    2. If no advanced adenoma, repeat in 3 years, then every 5 years.   E. PET/CT scan is not recommended.  3. Stage IV:   A. History and physical every 3-6 months for 2 years, then every 6 months for a total of 5 years.   B. CEA every 3-6 months for 2 years, then every 6 months for a total of 5 years.   C. Chest/abdominal/pelvic CT every 3-6 months (category 2b for frequency less than 6 months) for 2 years, then every 6-12 months for a total of 5 years.   D. Colonoscopy in 1 year except if no preoperative colonoscopy due to obstructing lesion, colonoscopy in 3-6 months.  1. If advanced adenoma, repeat in 1 year    2. If no advanced adenoma, repeat in 3 years, then every 5 years.   E. PET/CT scan is not recommended.        All questions were answered. The patient knows to call the clinic with any problems, questions or concerns. We can certainly see the patient much sooner if necessary.    This note is electronically  signed by: Twana First, MD 08/02/2017 2:47 PM

## 2017-08-03 ENCOUNTER — Other Ambulatory Visit (HOSPITAL_COMMUNITY): Payer: Self-pay | Admitting: Emergency Medicine

## 2017-08-03 MED ORDER — ALPRAZOLAM 1 MG PO TABS: 1.0000 mg | ORAL_TABLET | Freq: Two times a day (BID) | ORAL | 1 refills | Status: DC | PRN

## 2017-08-03 NOTE — Progress Notes (Signed)
Xanax refilled.  

## 2017-08-04 LAB — URINE CULTURE: CULTURE: NO GROWTH

## 2017-09-07 ENCOUNTER — Other Ambulatory Visit (HOSPITAL_COMMUNITY): Payer: Self-pay

## 2017-09-14 ENCOUNTER — Ambulatory Visit (HOSPITAL_COMMUNITY): Payer: Self-pay | Admitting: Internal Medicine

## 2017-10-24 ENCOUNTER — Other Ambulatory Visit (HOSPITAL_COMMUNITY): Payer: Self-pay | Admitting: *Deleted

## 2017-10-24 DIAGNOSIS — C20 Malignant neoplasm of rectum: Secondary | ICD-10-CM

## 2017-10-25 ENCOUNTER — Inpatient Hospital Stay (HOSPITAL_COMMUNITY): Payer: BLUE CROSS/BLUE SHIELD | Attending: Internal Medicine

## 2017-10-25 DIAGNOSIS — C20 Malignant neoplasm of rectum: Secondary | ICD-10-CM | POA: Diagnosis not present

## 2017-10-25 LAB — CBC WITH DIFFERENTIAL/PLATELET
BASOS ABS: 0 10*3/uL (ref 0.0–0.1)
Basophils Relative: 1 %
EOS PCT: 3 %
Eosinophils Absolute: 0.2 10*3/uL (ref 0.0–0.7)
HEMATOCRIT: 45.3 % (ref 39.0–52.0)
Hemoglobin: 14.6 g/dL (ref 13.0–17.0)
LYMPHS ABS: 1 10*3/uL (ref 0.7–4.0)
LYMPHS PCT: 15 %
MCH: 30.4 pg (ref 26.0–34.0)
MCHC: 32.2 g/dL (ref 30.0–36.0)
MCV: 94.4 fL (ref 78.0–100.0)
MONO ABS: 0.6 10*3/uL (ref 0.1–1.0)
MONOS PCT: 9 %
Neutro Abs: 4.7 10*3/uL (ref 1.7–7.7)
Neutrophils Relative %: 72 %
PLATELETS: 371 10*3/uL (ref 150–400)
RBC: 4.8 MIL/uL (ref 4.22–5.81)
RDW: 14.2 % (ref 11.5–15.5)
WBC: 6.4 10*3/uL (ref 4.0–10.5)

## 2017-10-25 LAB — COMPREHENSIVE METABOLIC PANEL
ALT: 25 U/L (ref 17–63)
AST: 21 U/L (ref 15–41)
Albumin: 3.6 g/dL (ref 3.5–5.0)
Alkaline Phosphatase: 112 U/L (ref 38–126)
Anion gap: 13 (ref 5–15)
BUN: 11 mg/dL (ref 6–20)
CHLORIDE: 105 mmol/L (ref 101–111)
CO2: 23 mmol/L (ref 22–32)
CREATININE: 0.87 mg/dL (ref 0.61–1.24)
Calcium: 9.6 mg/dL (ref 8.9–10.3)
Glucose, Bld: 131 mg/dL — ABNORMAL HIGH (ref 65–99)
POTASSIUM: 4.1 mmol/L (ref 3.5–5.1)
Sodium: 141 mmol/L (ref 135–145)
Total Bilirubin: 0.6 mg/dL (ref 0.3–1.2)
Total Protein: 8.3 g/dL — ABNORMAL HIGH (ref 6.5–8.1)

## 2017-11-01 ENCOUNTER — Ambulatory Visit (HOSPITAL_COMMUNITY): Payer: Self-pay | Admitting: Internal Medicine

## 2017-12-05 ENCOUNTER — Other Ambulatory Visit (HOSPITAL_COMMUNITY): Payer: Self-pay | Admitting: Emergency Medicine

## 2017-12-05 DIAGNOSIS — C20 Malignant neoplasm of rectum: Secondary | ICD-10-CM

## 2017-12-05 NOTE — Progress Notes (Signed)
Order placed for MRI per [facility] request.

## 2017-12-11 ENCOUNTER — Ambulatory Visit (HOSPITAL_COMMUNITY)
Admission: RE | Admit: 2017-12-11 | Discharge: 2017-12-11 | Disposition: A | Discharge status: Home / Self Care | Payer: BLUE CROSS/BLUE SHIELD | Source: Ambulatory Visit | Attending: Adult Health | Admitting: Adult Health

## 2017-12-11 DIAGNOSIS — C20 Malignant neoplasm of rectum: Secondary | ICD-10-CM

## 2017-12-11 DIAGNOSIS — K6389 Other specified diseases of intestine: Secondary | ICD-10-CM | POA: Insufficient documentation

## 2017-12-11 DIAGNOSIS — K624 Stenosis of anus and rectum: Secondary | ICD-10-CM | POA: Insufficient documentation

## 2017-12-11 DIAGNOSIS — N329 Bladder disorder, unspecified: Secondary | ICD-10-CM | POA: Diagnosis not present

## 2017-12-11 MED ORDER — GADOBENATE DIMEGLUMINE 529 MG/ML IV SOLN
15.0000 mL | Freq: Once | INTRAVENOUS | Status: AC | PRN
  Administered 2017-12-11: 14 mL via INTRAVENOUS

## 2017-12-20 ENCOUNTER — Other Ambulatory Visit (HOSPITAL_COMMUNITY): Payer: Self-pay | Admitting: Emergency Medicine

## 2017-12-20 DIAGNOSIS — C20 Malignant neoplasm of rectum: Secondary | ICD-10-CM

## 2017-12-20 NOTE — Progress Notes (Signed)
Pt emailed me about trying to get an appt because he has not been feeling well.  He had been on an antibiotic for an infection but cant shake the nausea and just feels exhausted.  I have put in lab orders for CBC diff, CMET, CEA and notified the pt that lab work is on 12/26/2017 at 10 am and he will see Dr Raliegh Ip at 10:50 am.  He verbalized understanding.  He will go up to [facility] on May 13th to have sigmoidoscopy.

## 2017-12-25 ENCOUNTER — Inpatient Hospital Stay (HOSPITAL_COMMUNITY): Payer: BLUE CROSS/BLUE SHIELD | Attending: Hematology

## 2017-12-25 DIAGNOSIS — F418 Other specified anxiety disorders: Secondary | ICD-10-CM | POA: Diagnosis not present

## 2017-12-25 DIAGNOSIS — Z923 Personal history of irradiation: Secondary | ICD-10-CM | POA: Insufficient documentation

## 2017-12-25 DIAGNOSIS — Z8619 Personal history of other infectious and parasitic diseases: Secondary | ICD-10-CM | POA: Diagnosis not present

## 2017-12-25 DIAGNOSIS — Z79899 Other long term (current) drug therapy: Secondary | ICD-10-CM | POA: Insufficient documentation

## 2017-12-25 DIAGNOSIS — R110 Nausea: Secondary | ICD-10-CM | POA: Insufficient documentation

## 2017-12-25 DIAGNOSIS — G629 Polyneuropathy, unspecified: Secondary | ICD-10-CM | POA: Diagnosis not present

## 2017-12-25 DIAGNOSIS — M4628 Osteomyelitis of vertebra, sacral and sacrococcygeal region: Secondary | ICD-10-CM | POA: Insufficient documentation

## 2017-12-25 DIAGNOSIS — G893 Neoplasm related pain (acute) (chronic): Secondary | ICD-10-CM | POA: Diagnosis not present

## 2017-12-25 DIAGNOSIS — C20 Malignant neoplasm of rectum: Secondary | ICD-10-CM | POA: Diagnosis not present

## 2017-12-25 LAB — CBC WITH DIFFERENTIAL/PLATELET
BASOS PCT: 0 %
Basophils Absolute: 0 10*3/uL (ref 0.0–0.1)
EOS PCT: 1 %
Eosinophils Absolute: 0.1 10*3/uL (ref 0.0–0.7)
HCT: 39.9 % (ref 39.0–52.0)
Hemoglobin: 12.6 g/dL — ABNORMAL LOW (ref 13.0–17.0)
LYMPHS ABS: 0.9 10*3/uL (ref 0.7–4.0)
Lymphocytes Relative: 10 %
MCH: 28.9 pg (ref 26.0–34.0)
MCHC: 31.6 g/dL (ref 30.0–36.0)
MCV: 91.5 fL (ref 78.0–100.0)
MONOS PCT: 16 %
Monocytes Absolute: 1.4 10*3/uL — ABNORMAL HIGH (ref 0.1–1.0)
NEUTROS PCT: 73 %
Neutro Abs: 6.3 10*3/uL (ref 1.7–7.7)
PLATELETS: 291 10*3/uL (ref 150–400)
RBC: 4.36 MIL/uL (ref 4.22–5.81)
RDW: 15.5 % (ref 11.5–15.5)
WBC: 8.8 10*3/uL (ref 4.0–10.5)

## 2017-12-25 LAB — COMPREHENSIVE METABOLIC PANEL
ALBUMIN: 3.5 g/dL (ref 3.5–5.0)
ALT: 11 U/L — AB (ref 17–63)
AST: 15 U/L (ref 15–41)
Alkaline Phosphatase: 72 U/L (ref 38–126)
Anion gap: 9 (ref 5–15)
BUN: 13 mg/dL (ref 6–20)
CHLORIDE: 105 mmol/L (ref 101–111)
CO2: 25 mmol/L (ref 22–32)
CREATININE: 0.96 mg/dL (ref 0.61–1.24)
Calcium: 9.5 mg/dL (ref 8.9–10.3)
GFR calc Af Amer: >60 mL/min (ref >60)
GFR calc non Af Amer: >60 mL/min (ref >60)
Glucose, Bld: 101 mg/dL — ABNORMAL HIGH (ref 65–99)
POTASSIUM: 3.9 mmol/L (ref 3.5–5.1)
SODIUM: 139 mmol/L (ref 135–145)
Total Bilirubin: 0.7 mg/dL (ref 0.3–1.2)
Total Protein: 7.8 g/dL (ref 6.5–8.1)

## 2017-12-26 ENCOUNTER — Inpatient Hospital Stay (HOSPITAL_BASED_OUTPATIENT_CLINIC_OR_DEPARTMENT_OTHER): Payer: BLUE CROSS/BLUE SHIELD | Admitting: Hematology

## 2017-12-26 ENCOUNTER — Other Ambulatory Visit (HOSPITAL_COMMUNITY): Payer: Self-pay

## 2017-12-26 ENCOUNTER — Encounter (HOSPITAL_COMMUNITY): Payer: Self-pay | Admitting: Hematology

## 2017-12-26 ENCOUNTER — Other Ambulatory Visit: Payer: Self-pay

## 2017-12-26 VITALS — BP 133/86 | HR 114 | Temp 98.6°F | Resp 18 | Wt 172.0 lb

## 2017-12-26 DIAGNOSIS — Z8619 Personal history of other infectious and parasitic diseases: Secondary | ICD-10-CM

## 2017-12-26 DIAGNOSIS — Z79899 Other long term (current) drug therapy: Secondary | ICD-10-CM | POA: Diagnosis not present

## 2017-12-26 DIAGNOSIS — R110 Nausea: Secondary | ICD-10-CM

## 2017-12-26 DIAGNOSIS — G629 Polyneuropathy, unspecified: Secondary | ICD-10-CM | POA: Diagnosis not present

## 2017-12-26 DIAGNOSIS — C20 Malignant neoplasm of rectum: Secondary | ICD-10-CM | POA: Diagnosis not present

## 2017-12-26 DIAGNOSIS — G893 Neoplasm related pain (acute) (chronic): Secondary | ICD-10-CM

## 2017-12-26 DIAGNOSIS — M4628 Osteomyelitis of vertebra, sacral and sacrococcygeal region: Secondary | ICD-10-CM

## 2017-12-26 DIAGNOSIS — Z923 Personal history of irradiation: Secondary | ICD-10-CM | POA: Diagnosis not present

## 2017-12-26 DIAGNOSIS — K6289 Other specified diseases of anus and rectum: Secondary | ICD-10-CM

## 2017-12-26 LAB — CEA: CEA: 8.4 ng/mL — ABNORMAL HIGH (ref 0.0–4.7)

## 2017-12-26 MED ORDER — OXYCODONE HCL 5 MG PO TABS: 5.0000 mg | ORAL_TABLET | Freq: Four times a day (QID) | ORAL | 0 refills | Status: DC | PRN

## 2017-12-26 MED ORDER — ONDANSETRON HCL 8 MG PO TABS: 4.0000 mg | ORAL_TABLET | Freq: Three times a day (TID) | ORAL | 1 refills | Status: DC | PRN

## 2017-12-26 MED ORDER — AMOXICILLIN-POT CLAVULANATE 875-125 MG PO TABS: 1.0000 | ORAL_TABLET | Freq: Two times a day (BID) | ORAL | 0 refills | Status: DC

## 2017-12-26 NOTE — Progress Notes (Signed)
Patient Care Team: [name], MD as PCP - General (Family Medicine)  DIAGNOSIS:  Encounter Diagnoses  Name Primary?  . Rectal cancer ([place]) Yes  . Rectal or anal pain   . Nausea without vomiting     SUMMARY OF ONCOLOGIC HISTORY: Oncology History   Stage IIIB rectal adenocarcinoma, [id number] Treated with 5Gy x 5 at [facility]     Rectal cancer [facility])   09/20/2015 Procedure    colonoscopy with firm rectal mass and 4 cm polyp in proximal rectum, infiltrating non-obstructing large mass within the distal rectum extending towards anal verge      09/20/2015 Miscellaneous    Microsatellite stable.       09/28/2015 Imaging    CT C/A/P no evidence of distant metastatic disease. irregular thickening of the low rectum and anal canal, several enlarged perirectal LN, indeterminate 53m RUL nodule      09/28/2015 Imaging    MRI pelvis assym diffuse circum lobular thickening of rectum, near full wall thickness extension along lower anterior rectum W/O extra serosal extension c/w known adeno, mult. perirectal LN      12/06/2015 - 12/10/2015 Radiation Therapy    [facility] Dr. AAngelina Ok  2500.00 cGy, 500.00 cGy per day in 5 fractions delivered 1x per day to the 96.0% Isodose line Treatment Dates: 12/06/2015 through 12/10/2015.       01/03/2016 Procedure    PICC placed      01/04/2016 - 03/14/2016 Chemotherapy    FOLFOX x 6 cycles      06/07/2016 Definitive Surgery    Robotic assisted APR with coloanal anastomosis and DLI [facility]     06/19/2016 - 06/23/2016 Hospital Admission    Admit date: 06/19/2016 Admission diagnosis: Sepsis Additional comments: Sepsis due to multiple gram-negative active uremia. Likely [facility] general surgery following, perirectal abscess draining by itself, white count and temperatures improved. He had a PICC line from prior to this admission which was line  removed on 06/21/2016, vancomycin stopped on 06/22/2016, He tolerated test dose of Rocephin well on 06/22/2016 without any issues, [facility] data, requested to follow with PCP in 3 days to make sure he is tolerating oral medications well and has continued to defervesce, request PCP to please check final culture and sensitivity results which should be back in 3 days. He will follow with general surgery outpatient as well.      02/15/2017 Imaging    CT CAP- 1. Well-formed presacral abscess is decreased in size but persistent. 2. No evidence of colorectal cancer metastasis in the abdomen pelvis. 3. Loop ileostomy in the RIGHT lower quadrant.      04/05/2017 Imaging    CT CAP-IMPRESSION: 1. Status post loop right-sided ileostomy with sigmoid to anal anastomosis. 2. Decrease in presacral fluid collection, consistent with abscess. New foci of extraluminal gas, suspicious for fistulous communication to the lower sigmoid. 3. No evidence of metastatic disease.      04/20/2017 Imaging    MRI pelvis w/ and w/o contrast IMPRESSION: 1. No appreciable change in chronic complex presacral space abscess, which demonstrates four separate sites of fistulous communication to the sigmoid colon and colo-anal anastomosis, as detailed. No discrete mass or other findings to suggest local tumor recurrence. 2. No evidence of metastatic disease in the pelvis.       CHIEF COMPLIANT: Stage IIIB  adenocarcinoma of rectum extending from rectosigmoid junction to anal verge s/p XRT (done at [facility]), chemotherapy (done at [facility]), and surgery (done at [facility]).  Noted to have recurrent disease in 06/2017; treated with concurrent Xeloda + XRT at [facility], completing treatment on 08/01/17.     INTERVAL HISTORY: Jonathan Wright is a 52 y.o. male here for routine follow-up for rectal cancer.  Here today with family.    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  He has abdominal cramps and pain, fatigue, mucus discharge, nausea, fevers, and chills; he completed 14-days of antibiotics (Cipro and Flagyl) recently. He has not been feeling well for the past month or so.   Denies any fevers in the past 2 days.  He continues to have nausea and weakness/fatigue.  The nausea is persistent.  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   Recently had MRI abd done here at [facility] for [facility] on 12/11/17.  Historically, we have assisted with ordering the patient's necessary scans as recommended by [facility] for convenience for the patient.   Recent labs from 12/25/17 reviewed.  CEA is elevated at 8.4 (previous CEA 07/19/17 was 3.7).    Shares with [place] today that he recently spoke with [facility] (Dr. Criselda Peaches, his surgeon) and they would like him to have FOLFOX chemotherapy, followed by extensive surgeries to include remainder of rectum to be removed with placement of permanent colostomy, cystectomy and prostatectomy.  Dr. Dayle Points is the patient's radiation oncologist at [facility].   He is struggling with how to proceed; he is interested in seeking 2nd opinion at additional tertiary care center.         REVIEW OF SYSTEMS:   Constitutional: Had fevers and chills in the last few weeks.  Denies any weight loss. Eyes: Denies blurriness of vision Ears, nose, mouth, throat, and face: Denies mucositis or sore throat Respiratory: Denies cough, dyspnea or wheezes Cardiovascular: Denies palpitation, chest discomfort Gastrointestinal:  Denies nausea, heartburn or change in bowel habits.  Complains of rectal pain in the last 3 to 4 weeks. Skin: Denies abnormal skin rashes Lymphatics: Denies new lymphadenopathy or easy bruising Neurological: Has numbness in the lower extremities which is stable. Behavioral/Psych: Mood is stable, no new changes  Extremities: No lower extremity edema All other  systems were reviewed with the patient and are negative.  I have reviewed the past medical history, past surgical history, social history and family history with the patient and they are unchanged from previous note.  ALLERGIES:  is allergic to penicillins and penicillin g.  MEDICATIONS:  Current Outpatient Medications  Medication Sig Dispense Refill  . ALPRAZolam (XANAX) 1 MG tablet Take 1 tablet (1 mg total) by mouth 2 (two) times daily as needed for anxiety. 30 tablet 1  . capecitabine (XELODA) 500 MG tablet Take 3 tabs PO twice a day on the days that you receive radiation. 17 days of radiation planned. 102 tablet 0  . amoxicillin-clavulanate (AUGMENTIN) 875-125 MG tablet Take 1 tablet by mouth 2 (two) times daily. 14 tablet 0  . ondansetron (ZOFRAN) 8 MG tablet Take 0.5 tablets (4 mg total) by mouth every 8 (eight) hours as needed for nausea or vomiting. 30 tablet 1  . oxyCODONE (OXY IR/ROXICODONE) 5 MG immediate release tablet Take 1 tablet (5 mg total) by mouth every 6 (six) hours as needed for severe pain. 28 tablet 0   No current facility-administered medications for this  visit.    Facility-Administered Medications Ordered in Other Visits  Medication Dose Route Frequency Provider Last Rate Last Dose  . sodium chloride flush (NS) 0.9 % injection 10 mL  10 mL Intracatheter PRN Penland, Kelby Fam, MD        PHYSICAL EXAMINATION: ECOG PERFORMANCE STATUS: 1 - Symptomatic but completely ambulatory  Vitals:   12/26/17 1227  BP: 133/86  Pulse: (!) 114  Resp: 18  Temp: 98.6 F (37 C)  SpO2: 100%   Filed Weights   12/26/17 1227  Weight: 172 lb (78 kg)    GENERAL:alert, no distress and comfortable SKIN: skin color, texture, turgor are normal, no rashes or significant lesions EYES: normal, Conjunctiva are pink and non-injected, sclera clear  LUNGS: clear to auscultation and percussion with normal breathing effort HEART: regular rate & rhythm and no murmurs and no lower  extremity edema ABDOMEN:abdomen soft, non-tender and normal bowel sounds Ileostomy present.  LABORATORY DATA:  I have reviewed the data as listed CMP Latest Ref Rng & Units 12/25/2017 10/25/2017 08/02/2017  Glucose 65 - 99 mg/dL 101(H) 131(H) 106(H)  BUN 6 - 20 mg/dL _0 Creatinine 0.61 - 1.24 mg/dL 0.96 0.87 0.96  Sodium 135 - 145 mmol/L 139 141 138  Potassium 3.5 - 5.1 mmol/L 3.9 4.1 3.6  Chloride 101 - 111 mmol/L 105 105 102  CO2 22 - 32 mmol/L _1 Calcium 8.9 - 10.3 mg/dL 9.5 9.6 9.4  Total Protein 6.5 - 8.1 g/dL 7.8 8.3(H) 7.5  Total Bilirubin 0.3 - 1.2 mg/dL 0.7 0.6 0.6  Alkaline Phos 38 - 126 U/L 72 112 85  AST 15 - 41 U/L _2 ALT 17 - 63 U/L 11(L) 25 17   No results found for: CAN153   Lab Results  Component Value Date   WBC 8.8 12/25/2017   HGB 12.6 (L) 12/25/2017   HCT 39.9 12/25/2017   MCV 91.5 12/25/2017   PLT 291 12/25/2017   NEUTROABS 6.3 12/25/2017    ASSESSMENT & PLAN:  Rectal cancer ([place]) 1.  Locally advanced rectal cancer: - Stage IIIb (T2N2) rectal adenocarcinoma, treated with radiation 5 Gy over 5 fractions at [facility], FOLFOX 6 cycles from 01/04/2016 through 03/14/2016, status post robotic APR with coloanal anastomosis and diverting loop ileostomy on 06/07/2016. - Post anastomotic dehiscence in October 2017 with pelvic abscess - Rectal biopsy on 03/19/2017 with invasive moderately differentiated adenocarcinoma, rebiopsy in October 2018 with local recurrence at anastomosis associated with an nonhealing pelvic abscess - Chemoradiation therapy with Xeloda from 07/09/2017 through 08/01/2017, 30.6 Gray in 17 fractions at [facility] on 12/11/2017 showing decrease in presacral fluid collection, second opinion reading at [facility] concerning for recurrence of tumor invading into the left seminal vesicle - Patient complaining of rectal pain, around the clock, 3 out of 10, occasionally increases to  7 out of 10 for the last 4 weeks with mucus discharge, finished 14 days of Cipro and Flagyl 2 days ago, continuing to have some discharge and pain - I will start him on Augmentin 875 mg twice daily.  For his pain I have given a prescription for oxycodone 5 mg every 6 hours as needed. - He was recommended to have some chemotherapy by his surgeon at [facility], followed by radical surgery.  I would like to order a PET CT scan as his most recent CEA level went up to 8.4.  This could be  infection related.  We will see him back after the PET CT scan.  Patient is also requesting a second opinion at a different facility.  We will look into this.  2.  Neuropathy: -She has constant numbness in the feet which is stable.  Total time spent is 40 minutes, with more than 50% of the time spent face-to-face discussing lab results, MRI results, further plan and coordination of care.   Orders Placed This Encounter  Procedures  . NM PET Image Initial (PI) Skull Base To Thigh    Standing Status:   Future    Standing Expiration Date:   12/26/2018    Order Specific Question:   If indicated for the ordered procedure, I authorize the administration of a radiopharmaceutical per Radiology protocol    Answer:   Yes    Order Specific Question:   Preferred imaging location?    Answer:   [facility]    Order Specific Question:   Radiology Contrast Protocol - do NOT remove file path    Answer:   _[email or web address]    Order Specific Question:   Reason for Exam additional comments    Answer:   recurrent rectal cancer   The patient has a good understanding of the overall plan. he agrees with it. he will call with any problems that may develop before the next visit here.  This note includes documentation from Mike Craze, NP, who was present during this patient's office visit and evaluation.  I have reviewed this note for its completeness and accuracy.  I have edited this  note accordingly based on my findings and medical opinion.      Derek Jack, MD 12/27/17

## 2017-12-27 ENCOUNTER — Encounter (HOSPITAL_COMMUNITY): Payer: Self-pay | Admitting: Hematology

## 2017-12-27 ENCOUNTER — Telehealth (HOSPITAL_COMMUNITY): Payer: Self-pay | Admitting: Emergency Medicine

## 2017-12-27 NOTE — Telephone Encounter (Signed)
Dr Raliegh Ip wants pt to start antibiotics this am in case he has a reaction.  He is to call us with any problems he has. He verbalized understanding.  Wants him to be finished with antibiotics when he has his PET scan completed.  Let Gildardo a message with the new PET scan date.  Friday 01/04/2018 at 1 pm.  Arrive at 12:30 at [facility].  NPO 6 hours prior and that includes candy or gum.  Same follow up doctors appt.  Told to call with any questions.

## 2017-12-27 NOTE — Assessment & Plan Note (Signed)
1.  Locally advanced rectal cancer: - Stage IIIb (T2N2) rectal adenocarcinoma, treated with radiation 5 Gy over 5 fractions at [facility], FOLFOX 6 cycles from 01/04/2016 through 03/14/2016, status post robotic APR with coloanal anastomosis and diverting loop ileostomy on 06/07/2016. - Post anastomotic dehiscence in October 2017 with pelvic abscess - Rectal biopsy on 03/19/2017 with invasive moderately differentiated adenocarcinoma, rebiopsy in October 2018 with local recurrence at anastomosis associated with an nonhealing pelvic abscess - Chemoradiation therapy with Xeloda from 07/09/2017 through 08/01/2017, 30.6 Gray in 17 fractions at [place] [facility] on 12/11/2017 showing decrease in presacral fluid collection, second opinion reading at [facility] concerning for recurrence of tumor invading into the left seminal vesicle - Patient complaining of rectal pain, around the clock, 3 out of 10, occasionally increases to 7 out of 10 for the last 4 weeks with mucus discharge, finished 14 days of Cipro and Flagyl 2 days ago, continuing to have some discharge and pain - I will start him on Augmentin 875 mg twice daily.  For his pain I have given a prescription for oxycodone 5 mg every 6 hours as needed. - He was recommended to have some chemotherapy by his surgeon at [facility], followed by radical surgery.  I would like to order a PET CT scan as his most recent CEA level went up to 8.4.  This could be infection related.  We will see him back after the PET CT scan.  Patient is also requesting a second opinion at a different facility.  We will look into this.  2.  Neuropathy: -She has constant numbness in the feet which is stable.

## 2018-01-02 ENCOUNTER — Other Ambulatory Visit (HOSPITAL_COMMUNITY): Payer: Self-pay

## 2018-01-04 ENCOUNTER — Encounter (HOSPITAL_COMMUNITY)
Admission: RE | Admit: 2018-01-04 | Discharge: 2018-01-04 | Disposition: A | Discharge status: Home / Self Care | Payer: BLUE CROSS/BLUE SHIELD | Source: Ambulatory Visit | Attending: Adult Health | Admitting: Adult Health

## 2018-01-04 DIAGNOSIS — C20 Malignant neoplasm of rectum: Secondary | ICD-10-CM

## 2018-01-04 LAB — GLUCOSE, CAPILLARY
GLUCOSE-CAPILLARY: 59 mg/dL — AB (ref 65–99)
Glucose-Capillary: 69 mg/dL (ref 65–99)

## 2018-01-04 MED ORDER — FLUDEOXYGLUCOSE F - 18 (FDG) INJECTION
9.5000 | Freq: Once | INTRAVENOUS | Status: AC | PRN
  Administered 2018-01-04: 9.5 via INTRAVENOUS

## 2018-01-07 ENCOUNTER — Inpatient Hospital Stay (HOSPITAL_BASED_OUTPATIENT_CLINIC_OR_DEPARTMENT_OTHER): Payer: BLUE CROSS/BLUE SHIELD | Admitting: Hematology

## 2018-01-07 ENCOUNTER — Other Ambulatory Visit: Payer: Self-pay

## 2018-01-07 ENCOUNTER — Encounter (HOSPITAL_COMMUNITY): Payer: Self-pay | Admitting: Hematology

## 2018-01-07 DIAGNOSIS — M4628 Osteomyelitis of vertebra, sacral and sacrococcygeal region: Secondary | ICD-10-CM | POA: Diagnosis not present

## 2018-01-07 DIAGNOSIS — G629 Polyneuropathy, unspecified: Secondary | ICD-10-CM

## 2018-01-07 DIAGNOSIS — G893 Neoplasm related pain (acute) (chronic): Secondary | ICD-10-CM

## 2018-01-07 DIAGNOSIS — Z79899 Other long term (current) drug therapy: Secondary | ICD-10-CM

## 2018-01-07 DIAGNOSIS — Z8619 Personal history of other infectious and parasitic diseases: Secondary | ICD-10-CM | POA: Diagnosis not present

## 2018-01-07 DIAGNOSIS — R110 Nausea: Secondary | ICD-10-CM

## 2018-01-07 DIAGNOSIS — C20 Malignant neoplasm of rectum: Secondary | ICD-10-CM | POA: Diagnosis not present

## 2018-01-07 DIAGNOSIS — Z923 Personal history of irradiation: Secondary | ICD-10-CM | POA: Diagnosis not present

## 2018-01-07 DIAGNOSIS — F418 Other specified anxiety disorders: Secondary | ICD-10-CM

## 2018-01-07 DIAGNOSIS — K6289 Other specified diseases of anus and rectum: Secondary | ICD-10-CM

## 2018-01-07 MED ORDER — OXYCODONE HCL 5 MG PO TABS: 5.0000 mg | ORAL_TABLET | ORAL | 0 refills | Status: DC | PRN

## 2018-01-07 NOTE — Assessment & Plan Note (Signed)
1.  Locally advanced rectal cancer: - Stage IIIb (T2N2) rectal adenocarcinoma, treated with radiation 5 Gy over 5 fractions at [facility], FOLFOX 6 cycles from 01/04/2016 through 03/14/2016, status post robotic APR with coloanal anastomosis and diverting loop ileostomy on 06/07/2016. - Post anastomotic dehiscence in October 2017 with pelvic abscess - Rectal biopsy on 03/19/2017 with invasive moderately differentiated adenocarcinoma, rebiopsy in October 2018 with local recurrence at anastomosis associated with an nonhealing pelvic abscess - Chemoradiation therapy with Xeloda from 07/09/2017 through 08/01/2017, 30.6 Gray in 17 fractions at [place] [facility] on 12/11/2017 showing decrease in presacral fluid collection, second opinion reading at [facility] concerning for recurrence of tumor invading into the left seminal vesicle - He was treated with Cipro and Flagyl followed by Augmentin.  He does have some clear discharge per rectum.  No fevers or chills noted.  He also feels constant pressure on the bladder. -His rectal pain is controlled with oxycodone.  However he gets pain after 4 hours.  I will change his oxycodone to 5 mg every 4 hours as needed. - I have reviewed the results of the PET CT scan dated 01/04/2018.  It showed intense uptake in the presacral collection area.  There were 2 fistulous tracts identified.  No evidence of distant metastatic disease. - I have also talked to Janith Lima, NP for Dr. Rush Landmark, medical oncologist at [facility].  His team of doctors there are recommending 3 months of chemotherapy followed by surgery.  Given his peripheral neuropathy, I think he will do well with FOLFIRI.  Patient is very concerned about the extensive radical surgery that was proposed to him following chemotherapy.  He want to seek a second opinion by different surgeon.  I will arrange this.  2.  Neuropathy: -He has constant numbness in the feet which is  stable.

## 2018-01-07 NOTE — Progress Notes (Signed)
[place], [place] [address]   CLINIC:  Medical Oncology/Hematology  PCP:  [name], MD [place] [id number] [phone]   REASON FOR VISIT:  Follow-up for locally recurrent rectal cancer.  CURRENT THERAPY: None.  BRIEF ONCOLOGIC HISTORY:  Oncology History   Stage IIIB rectal adenocarcinoma, mT2N2M0 Treated with 5Gy x 5 at [facility]     Rectal cancer [facility])   09/20/2015 Procedure    colonoscopy with firm rectal mass and 4 cm polyp in proximal rectum, infiltrating non-obstructing large mass within the distal rectum extending towards anal verge      09/20/2015 Miscellaneous    Microsatellite stable.       09/28/2015 Imaging    CT C/A/P no evidence of distant metastatic disease. irregular thickening of the low rectum and anal canal, several enlarged perirectal LN, indeterminate 20m RUL nodule      09/28/2015 Imaging    MRI pelvis assym diffuse circum lobular thickening of rectum, near full wall thickness extension along lower anterior rectum W/O extra serosal extension c/w known adeno, mult. perirectal LN      12/06/2015 - 12/10/2015 Radiation Therapy    [facility] Dr. AAngelina Ok  2500.00 cGy, 500.00 cGy per day in 5 fractions delivered 1x per day to the 96.0% Isodose line Treatment Dates: 12/06/2015 through 12/10/2015.       01/03/2016 Procedure    PICC placed      01/04/2016 - 03/14/2016 Chemotherapy    FOLFOX x 6 cycles      06/07/2016 Definitive Surgery    Robotic assisted APR with coloanal anastomosis and DLI [facility]     06/19/2016 - 06/23/2016 Hospital Admission    Admit date: 06/19/2016 Admission diagnosis: Sepsis Additional comments: Sepsis due to multiple gram-negative active uremia. Likely source [facility] general surgery following, perirectal abscess draining by itself, white count and  temperatures improved. He had a PICC line from prior to this admission which was line removed on 06/21/2016, vancomycin stopped on 06/22/2016, He tolerated test dose of Rocephin well on 06/22/2016 without any issues, [facility] be [facility] more days based on partial sensitivity data, requested to follow with PCP in 3 days to make sure he is tolerating oral medications well and has continued to defervesce, request PCP to please check final culture and sensitivity results which should be back in 3 days. He will follow with general surgery outpatient as well.      02/15/2017 Imaging    CT CAP- 1. Well-formed presacral abscess is decreased in size but persistent. 2. No evidence of colorectal cancer metastasis in the abdomen pelvis. 3. Loop ileostomy in the RIGHT lower quadrant.      04/05/2017 Imaging    CT CAP-IMPRESSION: 1. Status post loop right-sided ileostomy with sigmoid to anal anastomosis. 2. Decrease in presacral fluid collection, consistent with abscess. New foci of extraluminal gas, suspicious for fistulous communication to the lower sigmoid. 3. No evidence of metastatic disease.      04/20/2017 Imaging    MRI pelvis w/ and w/o contrast IMPRESSION: 1. No appreciable change in chronic complex presacral space abscess, which demonstrates four separate sites of fistulous communication to the sigmoid colon and colo-anal anastomosis, as detailed. No discrete mass or other findings to suggest local tumor recurrence. 2. No evidence of metastatic disease in the pelvis.  CANCER STAGING: Cancer Staging Rectal cancer [facility]) Staging form: Colon and Rectum, AJCC 7th Edition - Clinical stage from 01/03/2016: Stage IIIB (T2, N2a, M0) - Signed by Baird Cancer, PA-C on 01/03/2016    INTERVAL HISTORY:  Mr. Endicott 52 y.o. male returns for follow-up of his PET CT scan results.  He completed Augmentin.  He reported that he no longer has foul-smelling discharge.  He  has some clear discharge.  He denies any chills or fevers.  He reports some constant pressure on bladder.  He did not have any further nausea.  He did not take any Zofran.  He is taking oxycodone 5 mg every 6 hours as needed.  He reports that pain is coming back after 4 hours.  He asked me to increase the frequency of the medication.  His appetite has been good.  He is able to go outside because of better pain control.  REVIEW OF SYSTEMS:  Review of Systems  Constitutional: Positive for fatigue.  Gastrointestinal: Positive for rectal pain.  Neurological: Positive for numbness.  All other systems reviewed and are negative.    PAST MEDICAL/SURGICAL HISTORY:  Past Medical History:  Diagnosis Date  . Anxiety   . Depression   . Rectal cancer ([place]) 12/17/2015   Past Surgical History:  Procedure Laterality Date  . ABDOMINAL PERINEAL BOWEL RESECTION  06/07/2016   [facility]  . COLON SURGERY    . DIVERTING ILEOSTOMY  06/07/2016   [facility]  . LAPAROSCOPIC LOW ANTERIOR RESCECTION WITH COLOANAL ANASTOMOSIS  06/07/2016   [facility]  . PERIPHERALLY INSERTED CENTRAL CATHETER INSERTION  11/2015     SOCIAL HISTORY:  Social History   Socioeconomic History  . Marital status: Divorced    Spouse name: Not on file  . Number of children: Not on file  . Years of education: Not on file  . Highest education level: Not on file  Occupational History  . Not on file  Social Needs  . Financial resource strain: Not on file  . Food insecurity:    Worry: Not on file    Inability: Not on file  . Transportation needs:    Medical: Not on file    Non-medical: Not on file  Tobacco Use  . Smoking status: Never Smoker  . Smokeless tobacco: Never Used  Substance and Sexual Activity  . Alcohol use: Yes    Comment: occ.   . Drug use: No  . Sexual activity: Not on file  Lifestyle  . Physical activity:    Days per week: Not on file    Minutes per session: Not on file  . Stress: Not on file    Relationships  . Social connections:    Talks on phone: Not on file    Gets together: Not on file    Attends religious service: Not on file    Active member of club or organization: Not on file    Attends meetings of clubs or organizations: Not on file    Relationship status: Not on file  . Intimate partner violence:    Fear of current or ex partner: Not on file    Emotionally abused: Not on file    Physically abused: Not on file    Forced sexual activity: Not on file  Other Topics Concern  . Not on file  Social History Narrative  . Not on file    FAMILY HISTORY:  Family History  Problem Relation Age of Onset  . Rectal cancer Neg  Hx     CURRENT MEDICATIONS:  Outpatient Encounter Medications as of 01/07/2018  Medication Sig  . ALPRAZolam (XANAX) 1 MG tablet Take 1 tablet (1 mg total) by mouth 2 (two) times daily as needed for anxiety.  . ondansetron (ZOFRAN) 8 MG tablet Take 0.5 tablets (4 mg total) by mouth every 8 (eight) hours as needed for nausea or vomiting.  Marland Kitchen oxyCODONE (OXY IR/ROXICODONE) 5 MG immediate release tablet Take 1 tablet (5 mg total) by mouth every 4 (four) hours as needed for severe pain.  . [DISCONTINUED] oxyCODONE (OXY IR/ROXICODONE) 5 MG immediate release tablet Take 1 tablet (5 mg total) by mouth every 6 (six) hours as needed for severe pain.  . [DISCONTINUED] amoxicillin-clavulanate (AUGMENTIN) 875-125 MG tablet Take 1 tablet by mouth 2 (two) times daily.  . [DISCONTINUED] capecitabine (XELODA) 500 MG tablet Take 3 tabs PO twice a day on the days that you receive radiation. 17 days of radiation planned.   Facility-Administered Encounter Medications as of 01/07/2018  Medication  . sodium chloride flush (NS) 0.9 % injection 10 mL    ALLERGIES:  Allergies  Allergen Reactions  . Penicillins Hives    Has patient had a PCN reaction causing immediate rash, facial/tongue/throat swelling, SOB or lightheadedness with hypotension: Yes Has patient had a PCN  reaction causing severe rash involving mucus membranes or skin necrosis: No Has patient had a PCN reaction that required hospitalization No Has patient had a PCN reaction occurring within the last 10 years: No If all of the above answers are "NO", then may proceed with Cephalosporin use.   Marland Kitchen Penicillin G Other (See Comments)     PHYSICAL EXAM:  ECOG Performance status: 1  Vitals:   01/07/18 0928  BP: 125/79  Pulse: 70  Resp: 18  Temp: 98.3 F (36.8 C)  SpO2: 100%   Filed Weights   01/07/18 0928  Weight: 171 lb 8 oz (77.8 kg)    Physical Exam  Deferred. LABORATORY DATA:  I have reviewed the labs as listed.  CBC    Component Value Date/Time   WBC 8.8 12/25/2017 1336   RBC 4.36 12/25/2017 1336   HGB 12.6 (L) 12/25/2017 1336   HCT 39.9 12/25/2017 1336   PLT 291 12/25/2017 1336   MCV 91.5 12/25/2017 1336   MCH 28.9 12/25/2017 1336   MCHC 31.6 12/25/2017 1336   RDW 15.5 12/25/2017 1336   LYMPHSABS 0.9 12/25/2017 1336   MONOABS 1.4 (H) 12/25/2017 1336   EOSABS 0.1 12/25/2017 1336   BASOSABS 0.0 12/25/2017 1336   CMP Latest Ref Rng & Units 12/25/2017 10/25/2017 08/02/2017  Glucose 65 - 99 mg/dL 101(H) 131(H) 106(H)  BUN 6 - 20 mg/dL 13 11 15   Creatinine 0.61 - 1.24 mg/dL 0.96 0.87 0.96  Sodium 135 - 145 mmol/L 139 141 138  Potassium 3.5 - 5.1 mmol/L 3.9 4.1 3.6  Chloride 101 - 111 mmol/L 105 105 102  CO2 22 - 32 mmol/L 25 23 29   Calcium 8.9 - 10.3 mg/dL 9.5 9.6 9.4  Total Protein 6.5 - 8.1 g/dL 7.8 8.3(H) 7.5  Total Bilirubin 0.3 - 1.2 mg/dL 0.7 0.6 0.6  Alkaline Phos 38 - 126 U/L 72 112 85  AST 15 - 41 U/L 15 21 19   ALT 17 - 63 U/L 11(L) 25 17       DIAGNOSTIC IMAGING:  I have independently reviewed the images of his PET CT scan.   ASSESSMENT & PLAN:   Rectal cancer ([place]) 1.  Locally  advanced rectal cancer: - Stage IIIb (T2N2) rectal adenocarcinoma, treated with radiation 5 Gy over 5 fractions at [facility], FOLFOX 6 cycles from 01/04/2016  through 03/14/2016, status post robotic APR with coloanal anastomosis and diverting loop ileostomy on 06/07/2016. 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  He does have some clear discharge per rectum.  No fevers or chills noted.  He also feels constant pressure on the bladder. -His rectal pain is controlled with oxycodone.  However he gets pain after 4 hours.  I will change his oxycodone to 5 mg every 4 hours as needed. - I have reviewed the results of the PET CT scan dated 01/04/2018.  It showed intense uptake in the presacral collection area.  There were 2 fistulous tracts identified.  No evidence of distant metastatic disease. - I have also talked to Janith Lima, NP for Dr. Rush Landmark, medical oncologist at [facility].  His team of doctors there are recommending 3 months of chemotherapy followed by surgery.  Given his peripheral neuropathy, I think he will do well with FOLFIRI.  Patient is very concerned about the extensive radical surgery that was proposed to him following chemotherapy.  He want to seek a second opinion by different surgeon.  I will arrange this.  2.  Neuropathy: -He has constant numbness in the feet which is stable.       Derek Jack, MD [place] [phone]

## 2018-01-16 ENCOUNTER — Encounter (HOSPITAL_COMMUNITY): Payer: Self-pay | Admitting: Lab

## 2018-01-16 NOTE — Progress Notes (Unsigned)
Referral sent to [facility] for 2nd opinion.  Faxed records to [phone] and phone # [phone].  Jonathan Wright.  I spoke to patient and he is to get Jonathan Wright to fax their records to [facility].  Mayo will review all records and determine if he is able to see them for a second opinion.

## 2018-01-22 ENCOUNTER — Other Ambulatory Visit (HOSPITAL_COMMUNITY): Payer: Self-pay | Admitting: Hematology

## 2018-01-22 DIAGNOSIS — K6289 Other specified diseases of anus and rectum: Secondary | ICD-10-CM

## 2018-01-22 MED ORDER — OXYCODONE HCL 5 MG PO TABS: 5.0000 mg | ORAL_TABLET | ORAL | 0 refills | Status: DC | PRN

## 2018-02-01 ENCOUNTER — Inpatient Hospital Stay (HOSPITAL_COMMUNITY): Payer: BLUE CROSS/BLUE SHIELD | Attending: Hematology

## 2018-02-01 ENCOUNTER — Telehealth (HOSPITAL_COMMUNITY): Payer: Self-pay

## 2018-02-01 ENCOUNTER — Other Ambulatory Visit (HOSPITAL_COMMUNITY): Payer: Self-pay

## 2018-02-01 DIAGNOSIS — C20 Malignant neoplasm of rectum: Secondary | ICD-10-CM | POA: Insufficient documentation

## 2018-02-01 DIAGNOSIS — K6289 Other specified diseases of anus and rectum: Secondary | ICD-10-CM

## 2018-02-01 DIAGNOSIS — N390 Urinary tract infection, site not specified: Secondary | ICD-10-CM

## 2018-02-01 LAB — URINALYSIS, ROUTINE W REFLEX MICROSCOPIC
Bilirubin Urine: NEGATIVE
Glucose, UA: NEGATIVE mg/dL
Ketones, ur: NEGATIVE mg/dL
Nitrite: NEGATIVE
PROTEIN: 100 mg/dL — AB
RBC / HPF: >50 RBC/hpf — ABNORMAL HIGH (ref 0–5)
Specific Gravity, Urine: 1.02 (ref 1.005–1.030)
pH: 5 (ref 5.0–8.0)

## 2018-02-01 MED ORDER — OXYCODONE HCL 5 MG PO TABS: 5.0000 mg | ORAL_TABLET | ORAL | 0 refills | Status: DC | PRN

## 2018-02-01 MED ORDER — CIPROFLOXACIN HCL 500 MG PO TABS: 500.0000 mg | ORAL_TABLET | Freq: Two times a day (BID) | ORAL | 0 refills | Status: DC

## 2018-02-01 NOTE — Progress Notes (Signed)
Patient called stating he was having cloudy urine with flecks floating in it. Reviewed with provider. Patient is coming to give urine specimen to check for UTI. Patient also needs refill on pain meds. Prescription printed and given to provider.

## 2018-02-01 NOTE — Telephone Encounter (Signed)
Per Dr. Delton Coombes, patient needs cipro 500 mg po bid x 7 days. Prescription sent to [facility] and left message on patients voicemail.

## 2018-02-08 ENCOUNTER — Telehealth (HOSPITAL_COMMUNITY): Payer: Self-pay | Admitting: *Deleted

## 2018-02-08 NOTE — Telephone Encounter (Signed)
LMOM for pt to return call. Returning call about antibiotics.

## 2018-02-08 NOTE — Telephone Encounter (Signed)
Pt states that he is still having some cloudy urine and still having some urge after going. Thinks he may need more antibiotic.  I spoke with Kirby Crigler PA and he advised the pt to push fluids and take tylenol if there is any pain and to come back Monday or Tuesday for UA, C&S.   Pt verbalized understanding. Pt will call Monday for lab appointment time.

## 2018-02-11 ENCOUNTER — Telehealth (HOSPITAL_COMMUNITY): Payer: Self-pay | Admitting: *Deleted

## 2018-02-11 ENCOUNTER — Other Ambulatory Visit (HOSPITAL_COMMUNITY): Payer: Self-pay | Admitting: *Deleted

## 2018-02-11 ENCOUNTER — Other Ambulatory Visit (HOSPITAL_COMMUNITY): Payer: Self-pay

## 2018-02-11 DIAGNOSIS — R300 Dysuria: Secondary | ICD-10-CM

## 2018-02-11 DIAGNOSIS — N390 Urinary tract infection, site not specified: Secondary | ICD-10-CM

## 2018-02-11 DIAGNOSIS — K6289 Other specified diseases of anus and rectum: Secondary | ICD-10-CM

## 2018-02-11 MED ORDER — OXYCODONE HCL 5 MG PO TABS: 5.0000 mg | ORAL_TABLET | ORAL | 0 refills | Status: DC | PRN

## 2018-02-12 ENCOUNTER — Inpatient Hospital Stay (HOSPITAL_COMMUNITY): Payer: BLUE CROSS/BLUE SHIELD

## 2018-02-12 DIAGNOSIS — C20 Malignant neoplasm of rectum: Secondary | ICD-10-CM

## 2018-02-12 DIAGNOSIS — K611 Rectal abscess: Secondary | ICD-10-CM

## 2018-02-12 DIAGNOSIS — N390 Urinary tract infection, site not specified: Secondary | ICD-10-CM

## 2018-02-12 LAB — URINALYSIS, ROUTINE W REFLEX MICROSCOPIC
BILIRUBIN URINE: NEGATIVE
Glucose, UA: NEGATIVE mg/dL
HGB URINE DIPSTICK: NEGATIVE
KETONES UR: NEGATIVE mg/dL
Nitrite: NEGATIVE
PROTEIN: 100 mg/dL — AB
Specific Gravity, Urine: 1.019 (ref 1.005–1.030)
WBC, UA: >50 WBC/hpf — ABNORMAL HIGH (ref 0–5)
pH: 5 (ref 5.0–8.0)

## 2018-02-12 NOTE — Telephone Encounter (Signed)
Pt given refill and pt came by the clinic to get rx.

## 2018-02-15 ENCOUNTER — Inpatient Hospital Stay (HOSPITAL_COMMUNITY): Payer: BLUE CROSS/BLUE SHIELD

## 2018-02-15 ENCOUNTER — Other Ambulatory Visit (HOSPITAL_COMMUNITY): Payer: Self-pay | Admitting: *Deleted

## 2018-02-15 DIAGNOSIS — C20 Malignant neoplasm of rectum: Secondary | ICD-10-CM | POA: Diagnosis not present

## 2018-02-15 DIAGNOSIS — N390 Urinary tract infection, site not specified: Secondary | ICD-10-CM

## 2018-02-17 LAB — URINE CULTURE: Culture: >=100000 — AB

## 2018-02-19 ENCOUNTER — Telehealth (HOSPITAL_COMMUNITY): Payer: Self-pay | Admitting: *Deleted

## 2018-02-19 MED ORDER — SULFAMETHOXAZOLE-TRIMETHOPRIM 800-160 MG PO TABS: 1.0000 | ORAL_TABLET | Freq: Two times a day (BID) | ORAL | 0 refills | Status: AC

## 2018-02-19 NOTE — Telephone Encounter (Signed)
LMOM for pt informing him that an antibiotic will be sent to his pharmacy and that if he has any questions to please call either Jaynie Collins or Diane.

## 2018-02-22 ENCOUNTER — Other Ambulatory Visit (HOSPITAL_COMMUNITY): Payer: Self-pay | Admitting: Nurse Practitioner

## 2018-02-22 DIAGNOSIS — C20 Malignant neoplasm of rectum: Secondary | ICD-10-CM

## 2018-02-22 DIAGNOSIS — K6289 Other specified diseases of anus and rectum: Secondary | ICD-10-CM

## 2018-02-22 MED ORDER — OXYCODONE HCL 5 MG PO TABS: 5.0000 mg | ORAL_TABLET | ORAL | 0 refills | Status: DC | PRN

## 2018-02-22 MED ORDER — OXYCODONE HCL 5 MG PO TABS: 7.5000 mg | ORAL_TABLET | ORAL | 0 refills | Status: DC | PRN

## 2018-03-04 ENCOUNTER — Other Ambulatory Visit (HOSPITAL_COMMUNITY): Payer: Self-pay | Admitting: *Deleted

## 2018-03-04 DIAGNOSIS — K6289 Other specified diseases of anus and rectum: Secondary | ICD-10-CM

## 2018-03-04 DIAGNOSIS — C20 Malignant neoplasm of rectum: Secondary | ICD-10-CM

## 2018-03-05 ENCOUNTER — Encounter (HOSPITAL_COMMUNITY): Payer: Self-pay | Admitting: *Deleted

## 2018-03-05 ENCOUNTER — Other Ambulatory Visit (HOSPITAL_COMMUNITY): Payer: Self-pay | Admitting: Nurse Practitioner

## 2018-03-05 DIAGNOSIS — C20 Malignant neoplasm of rectum: Secondary | ICD-10-CM

## 2018-03-05 DIAGNOSIS — K6289 Other specified diseases of anus and rectum: Secondary | ICD-10-CM

## 2018-03-05 MED ORDER — OXYCODONE HCL 5 MG PO TABS: 7.5000 mg | ORAL_TABLET | ORAL | 0 refills | Status: DC | PRN

## 2018-03-05 NOTE — Progress Notes (Signed)
I talked with Joycelyn Schmid at [facility] and she requested that we send patient's recent PET scan to them for review.    Manuela Schwartz in our radiology department is sending disc to  [facility] room [place], [place]

## 2018-03-14 ENCOUNTER — Other Ambulatory Visit (HOSPITAL_COMMUNITY): Payer: Self-pay | Admitting: *Deleted

## 2018-03-14 ENCOUNTER — Inpatient Hospital Stay (HOSPITAL_COMMUNITY): Payer: BLUE CROSS/BLUE SHIELD | Attending: Hematology

## 2018-03-14 DIAGNOSIS — C20 Malignant neoplasm of rectum: Secondary | ICD-10-CM

## 2018-03-14 DIAGNOSIS — K6289 Other specified diseases of anus and rectum: Secondary | ICD-10-CM

## 2018-03-14 LAB — URINALYSIS, ROUTINE W REFLEX MICROSCOPIC
Bilirubin Urine: NEGATIVE
Glucose, UA: NEGATIVE mg/dL
KETONES UR: NEGATIVE mg/dL
NITRITE: POSITIVE — AB
PH: 5 (ref 5.0–8.0)
PROTEIN: 100 mg/dL — AB
RBC / HPF: >50 RBC/hpf — ABNORMAL HIGH (ref 0–5)
Specific Gravity, Urine: 1.02 (ref 1.005–1.030)
WBC, UA: >50 WBC/hpf — ABNORMAL HIGH (ref 0–5)

## 2018-03-14 MED ORDER — NITROFURANTOIN MONOHYD MACRO 100 MG PO CAPS: 100.0000 mg | ORAL_CAPSULE | Freq: Every day | ORAL | 0 refills | Status: DC

## 2018-03-14 MED ORDER — OXYCODONE HCL 5 MG PO TABS: 7.5000 mg | ORAL_TABLET | ORAL | 0 refills | Status: DC | PRN

## 2018-03-14 NOTE — Progress Notes (Signed)
Urinalysis reviewed with Dr. Delton Coombes, orders received for antibiotic and to send urine for culture and sensitivity.  Patient is aware.

## 2018-03-17 LAB — URINE CULTURE: Culture: >=100000 — AB

## 2018-03-22 ENCOUNTER — Other Ambulatory Visit (HOSPITAL_COMMUNITY): Payer: Self-pay | Admitting: Nurse Practitioner

## 2018-03-22 ENCOUNTER — Other Ambulatory Visit (HOSPITAL_COMMUNITY): Payer: Self-pay | Admitting: *Deleted

## 2018-03-22 DIAGNOSIS — K6289 Other specified diseases of anus and rectum: Secondary | ICD-10-CM

## 2018-03-22 MED ORDER — OXYCODONE HCL 5 MG PO TABS: 7.5000 mg | ORAL_TABLET | ORAL | 0 refills | Status: DC | PRN

## 2018-04-12 ENCOUNTER — Other Ambulatory Visit (HOSPITAL_COMMUNITY): Payer: Self-pay | Admitting: Nurse Practitioner

## 2018-04-12 ENCOUNTER — Other Ambulatory Visit (HOSPITAL_COMMUNITY): Payer: Self-pay | Admitting: *Deleted

## 2018-04-12 DIAGNOSIS — K6289 Other specified diseases of anus and rectum: Secondary | ICD-10-CM

## 2018-04-12 MED ORDER — OXYCODONE HCL 5 MG PO TABS: 7.5000 mg | ORAL_TABLET | ORAL | 0 refills | Status: DC | PRN

## 2018-04-12 MED ORDER — ALPRAZOLAM 1 MG PO TABS: 0.5000 mg | ORAL_TABLET | Freq: Two times a day (BID) | ORAL | 1 refills | Status: DC | PRN

## 2018-04-16 ENCOUNTER — Encounter (HOSPITAL_COMMUNITY): Payer: Self-pay | Admitting: Hematology

## 2018-04-16 ENCOUNTER — Inpatient Hospital Stay (HOSPITAL_COMMUNITY): Payer: BLUE CROSS/BLUE SHIELD | Attending: Hematology | Admitting: Hematology

## 2018-04-16 VITALS — BP 112/79 | HR 106 | Temp 98.2°F | Resp 16 | Wt 158.8 lb

## 2018-04-16 DIAGNOSIS — F329 Major depressive disorder, single episode, unspecified: Secondary | ICD-10-CM | POA: Diagnosis not present

## 2018-04-16 DIAGNOSIS — G893 Neoplasm related pain (acute) (chronic): Secondary | ICD-10-CM

## 2018-04-16 DIAGNOSIS — Z79899 Other long term (current) drug therapy: Secondary | ICD-10-CM | POA: Diagnosis not present

## 2018-04-16 DIAGNOSIS — Z933 Colostomy status: Secondary | ICD-10-CM | POA: Diagnosis not present

## 2018-04-16 DIAGNOSIS — G629 Polyneuropathy, unspecified: Secondary | ICD-10-CM

## 2018-04-16 DIAGNOSIS — C20 Malignant neoplasm of rectum: Secondary | ICD-10-CM | POA: Insufficient documentation

## 2018-04-16 DIAGNOSIS — N390 Urinary tract infection, site not specified: Secondary | ICD-10-CM

## 2018-04-16 DIAGNOSIS — Z9221 Personal history of antineoplastic chemotherapy: Secondary | ICD-10-CM | POA: Insufficient documentation

## 2018-04-16 DIAGNOSIS — Z923 Personal history of irradiation: Secondary | ICD-10-CM | POA: Insufficient documentation

## 2018-04-16 NOTE — Assessment & Plan Note (Signed)
1.  Locally advanced rectal cancer: - Stage IIIb (T2N2) rectal adenocarcinoma, treated with radiation 5 Gy over 5 fractions at [facility], FOLFOX 6 cycles from 01/04/2016 through 03/14/2016, status post robotic APR with coloanal anastomosis and diverting loop ileostomy on 06/07/2016. 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  It showed intense uptake in the presacral collection area.  There were 2 fistulous tracts identified.  No evidence of distant metastatic disease. - I have also talked to Janith Lima, NP for Dr. Rush Landmark, medical oncologist at [facility].  His team of doctors there are recommending 3 months of chemotherapy followed by surgery.  Given his peripheral neuropathy, I think he will do well with FOLFIRI.  Patient is very concerned about the extensive radical surgery that was proposed to him following chemotherapy.  He want to seek a second opinion by different surgeon. - He was seen by Dr. Percell Boston at [facility] clinic.  An MRI was repeated.  A CT-guided biopsy with CT-guided drain placement of the pelvic fluid collection was done on 03/22/2018.  The drain initially put out about 50 cc/day.  Right now it is putting out 25 cc/day.  His rectal discharge has stopped after the drain was placed.  The biopsy was consistent with adenocarcinoma. - He was then referred to Dr. Suanne Marker and was  presented at their local tumor board at [facility].  Pelvic exenteration with cystectomy, prostatectomy, resection of rectum/anus/anastomosis, the pelvic inflammation, pelvic sidewall with IORT, urinary reconstruction with ileal conduit and end colostomy (possible ileostomy takedown) and flap reconstruction.  He also cautioned that with chronic leak, he may not proceed with chemotherapy. - I would contact Dr. Rush Landmark at [facility].  Together will make a plan whether he should go on chemotherapy prior to his planned exenteration.  2.  Neuropathy: -He has constant numbness in the feet which is stable.  3.  Rectal pain: -He is taking oxycodone 5 mg 1 and half tablets every 4 hours as needed.  Pain at times is not well controlled.  I will increase oxycodone to 10 mg every 4 hours as needed.  I have asked him to cut back on using ibuprofen as his creatinine is going up.

## 2018-04-16 NOTE — Progress Notes (Signed)
[place], [place] [address]   CLINIC:  Medical Oncology/Hematology  PCP:  [name], MD [address] [place] [address] [phone]   REASON FOR VISIT:  Follow-up for stage IIIB rectal adenocarcinoma  CURRENT THERAPY: Completed chemo and radiation  BRIEF ONCOLOGIC HISTORY:  Oncology History   Stage IIIB rectal adenocarcinoma, mT2N2M0 Treated with 5Gy x 5 at [facility]     Rectal cancer [facility])   09/20/2015 Procedure    colonoscopy with firm rectal mass and 4 cm polyp in proximal rectum, infiltrating non-obstructing large mass within the distal rectum extending towards anal verge    09/20/2015 Miscellaneous    Microsatellite stable.     09/28/2015 Imaging    CT C/A/P no evidence of distant metastatic disease. irregular thickening of the low rectum and anal canal, several enlarged perirectal LN, indeterminate 81m RUL nodule    09/28/2015 Imaging    MRI pelvis assym diffuse circum lobular thickening of rectum, near full wall thickness extension along lower anterior rectum W/O extra serosal extension c/w known adeno, mult. perirectal LN    12/06/2015 - 12/10/2015 Radiation Therapy    [facility] Dr. AAngelina Ok  2500.00 cGy, 500.00 cGy per day in 5 fractions delivered 1x per day to the 96.0% Isodose line Treatment Dates: 12/06/2015 through 12/10/2015.     01/03/2016 Procedure    PICC placed    01/04/2016 - 03/14/2016 Chemotherapy    FOLFOX x 6 cycles    06/07/2016 Definitive Surgery    Robotic assisted APR with coloanal anastomosis and [facility]   06/19/2016 - 06/23/2016 Hospital Admission    Admit date: 06/19/2016 Admission diagnosis: Sepsis Additional comments: Sepsis due to multiple gram-negative active uremia. Likely source [facility] general surgery following, perirectal abscess draining by itself, white count and  temperatures improved. He had a PICC line from prior to this admission which was line removed on 06/21/2016, vancomycin stopped on 06/22/2016, He tolerated test dose of Rocephin well on 06/22/2016 without any issues, [facility] sensitivity data, requested to follow with PCP in 3 days to make sure he is tolerating oral medications well and has continued to defervesce, request PCP to please check final culture and sensitivity results which should be back in 3 days. He will follow with general surgery outpatient as well.    02/15/2017 Imaging    CT CAP- 1. Well-formed presacral abscess is decreased in size but persistent. 2. No evidence of colorectal cancer metastasis in the abdomen pelvis. 3. Loop ileostomy in the RIGHT lower quadrant.    04/05/2017 Imaging    CT CAP-IMPRESSION: 1. Status post loop right-sided ileostomy with sigmoid to anal anastomosis. 2. Decrease in presacral fluid collection, consistent with abscess. New foci of extraluminal gas, suspicious for fistulous communication to the lower sigmoid. 3. No evidence of metastatic disease.    04/20/2017 Imaging    MRI pelvis w/ and w/o contrast IMPRESSION: 1. No appreciable change in chronic complex presacral space abscess, which demonstrates four separate sites of fistulous communication to the sigmoid colon and colo-anal anastomosis, as detailed. No discrete mass or other findings to suggest local tumor recurrence. 2. No evidence of metastatic disease in the pelvis.      CANCER STAGING: Cancer Staging Rectal cancer ([facility] Staging form: Colon and Rectum, AJCC 7th Edition -  Clinical stage from 01/03/2016: Stage IIIB (T2, N2a, M0) - Signed by Baird Cancer, PA-C on 01/03/2016    INTERVAL HISTORY:  Mr. Garofano 52 y.o. male returns for routine follow-up for stage IIIB rectal carcinoma. Patient is here today with his partner. Patient has his rectal drain in place and it is  draining about 10-20 cc daily. When it was first placed it was draining 50cc daily. Patient still has pain and is asking for an increase in his dosage. He is depressed and not getting out of the house every much. Patient reports his appetite at 100% and energy level at 50%.     REVIEW OF SYSTEMS:  Review of Systems  Constitutional: Positive for fatigue.  All other systems reviewed and are negative.    PAST MEDICAL/SURGICAL HISTORY:  Past Medical History:  Diagnosis Date  . Anxiety   . Depression   . Rectal cancer ([place]) 12/17/2015   Past Surgical History:  Procedure Laterality Date  . ABDOMINAL PERINEAL BOWEL RESECTION  06/07/2016   [facility]  . COLON SURGERY    . DIVERTING ILEOSTOMY  06/07/2016   [facility]  . LAPAROSCOPIC LOW ANTERIOR RESCECTION WITH COLOANAL ANASTOMOSIS  06/07/2016   [facility]  . PERIPHERALLY INSERTED CENTRAL CATHETER INSERTION  11/2015     SOCIAL HISTORY:  Social History   Socioeconomic History  . Marital status: Divorced    Spouse name: Not on file  . Number of children: Not on file  . Years of education: Not on file  . Highest education level: Not on file  Occupational History  . Not on file  Social Needs  . Financial resource strain: Not on file  . Food insecurity:    Worry: Not on file    Inability: Not on file  . Transportation needs:    Medical: Not on file    Non-medical: Not on file  Tobacco Use  . Smoking status: Never Smoker  . Smokeless tobacco: Never Used  Substance and Sexual Activity  . Alcohol use: Yes    Comment: occ.   . Drug use: No  . Sexual activity: Not on file  Lifestyle  . Physical activity:    Days per week: Not on file    Minutes per session: Not on file  . Stress: Not on file  Relationships  . Social connections:    Talks on phone: Not on file    Gets together: Not on file    Attends religious service: Not on file    Active member of club or organization: Not on file    Attends meetings of  clubs or organizations: Not on file    Relationship status: Not on file  . Intimate partner violence:    Fear of current or ex partner: Not on file    Emotionally abused: Not on file    Physically abused: Not on file    Forced sexual activity: Not on file  Other Topics Concern  . Not on file  Social History Narrative  . Not on file    FAMILY HISTORY:  Family History  Problem Relation Age of Onset  . Rectal cancer Neg Hx     CURRENT MEDICATIONS:  Outpatient Encounter Medications as of 04/16/2018  Medication Sig  . ALPRAZolam (XANAX) 1 MG tablet Take 0.5 tablets (0.5 mg total) by mouth 2 (two) times daily as needed for anxiety.  Marland Kitchen oxyCODONE (OXY IR/ROXICODONE) 5 MG immediate release tablet Take 1.5 tablets (7.5 mg total) by mouth every  4 (four) hours as needed for severe pain.  Marland Kitchen sulfamethoxazole-trimethoprim (BACTRIM DS,SEPTRA DS) 800-160 MG tablet Take 1 tablet by mouth 2 (two) times daily.  . ciprofloxacin (CIPRO) 500 MG tablet Take 1 tablet (500 mg total) by mouth 2 (two) times daily. (Patient not taking: Reported on 04/16/2018)  . nitrofurantoin, macrocrystal-monohydrate, (MACROBID) 100 MG capsule Take 1 capsule (100 mg total) by mouth daily. (Patient not taking: Reported on 04/16/2018)  . ondansetron (ZOFRAN) 8 MG tablet Take 0.5 tablets (4 mg total) by mouth every 8 (eight) hours as needed for nausea or vomiting. (Patient not taking: Reported on 04/16/2018)   Facility-Administered Encounter Medications as of 04/16/2018  Medication  . sodium chloride flush (NS) 0.9 % injection 10 mL    ALLERGIES:  Allergies  Allergen Reactions  . Penicillins Hives    Has patient had a PCN reaction causing immediate rash, facial/tongue/throat swelling, SOB or lightheadedness with hypotension: Yes Has patient had a PCN reaction causing severe rash involving mucus membranes or skin necrosis: No Has patient had a PCN reaction that required hospitalization No Has patient had a PCN reaction  occurring within the last 10 years: No If all of the above answers are "NO", then may proceed with Cephalosporin use.   Marland Kitchen Penicillin G Other (See Comments)     PHYSICAL EXAM:  ECOG Performance status: 1  Vitals:   04/16/18 1357  BP: 112/79  Pulse: (!) 106  Resp: 16  Temp: 98.2 F (36.8 C)  SpO2: 99%   Filed Weights   04/16/18 1357  Weight: 158 lb 12.8 oz (72 kg)    Physical Exam Drain site in the right buttock area does not have infection. Colostomy area is also within normal limits.  LABORATORY DATA:  I have reviewed the labs as listed.  CBC    Component Value Date/Time   WBC 8.8 12/25/2017 1336   RBC 4.36 12/25/2017 1336   HGB 12.6 (L) 12/25/2017 1336   HCT 39.9 12/25/2017 1336   PLT 291 12/25/2017 1336   MCV 91.5 12/25/2017 1336   MCH 28.9 12/25/2017 1336   MCHC 31.6 12/25/2017 1336   RDW 15.5 12/25/2017 1336   LYMPHSABS 0.9 12/25/2017 1336   MONOABS 1.4 (H) 12/25/2017 1336   EOSABS 0.1 12/25/2017 1336   BASOSABS 0.0 12/25/2017 1336   CMP Latest Ref Rng & Units 12/25/2017 10/25/2017 08/02/2017  Glucose 65 - 99 mg/dL 101(H) 131(H) 106(H)  BUN 6 - 20 mg/dL 13 11 15   Creatinine 0.61 - 1.24 mg/dL 0.96 0.87 0.96  Sodium 135 - 145 mmol/L 139 141 138  Potassium 3.5 - 5.1 mmol/L 3.9 4.1 3.6  Chloride 101 - 111 mmol/L 105 105 102  CO2 22 - 32 mmol/L 25 23 29   Calcium 8.9 - 10.3 mg/dL 9.5 9.6 9.4  Total Protein 6.5 - 8.1 g/dL 7.8 8.3(H) 7.5  Total Bilirubin 0.3 - 1.2 mg/dL 0.7 0.6 0.6  Alkaline Phos 38 - 126 U/L 72 112 85  AST 15 - 41 U/L 15 21 19   ALT 17 - 63 U/L 11(L) 25 17       DIAGNOSTIC IMAGING:  I have independently reviewed reports of the MRI from [facility] clinic.     ASSESSMENT & PLAN:   Rectal cancer ([place]) 1.  Locally advanced rectal cancer: - Stage IIIb (T2N2) rectal adenocarcinoma, treated with radiation 5 Gy over 5 fractions at [facility], FOLFOX 6 cycles from 01/04/2016 through 03/14/2016, status post robotic APR with  coloanal anastomosis and diverting loop  ileostomy on 06/07/2016. 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  It showed intense uptake in the presacral collection area.  There were 2 fistulous tracts identified.  No evidence of distant metastatic disease. - I have also talked to Janith Lima, NP for Dr. Rush Landmark, medical oncologist at [facility].  His team of doctors there are recommending 3 months of chemotherapy followed by surgery.  Given his peripheral neuropathy, I think he will do well with FOLFIRI.  Patient is very concerned about the extensive radical surgery that was proposed to him following chemotherapy.  He want to seek a second opinion by different surgeon. - He was seen by Dr. Percell Boston at [facility] clinic.  An MRI was repeated.  A CT-guided biopsy with CT-guided drain placement of the pelvic fluid collection was done on 03/22/2018.  The drain initially put out about 50 cc/day.  Right now it is putting out 25 cc/day.  His rectal discharge has stopped after the drain was placed.  The biopsy was consistent with adenocarcinoma. - He was then referred to Dr. Suanne Marker and was presented at their local tumor board at [facility].  Pelvic exenteration with cystectomy, prostatectomy, resection of rectum/anus/anastomosis, the pelvic inflammation, pelvic sidewall with IORT, urinary reconstruction with  ileal conduit and end colostomy (possible ileostomy takedown) and flap reconstruction.  He also cautioned that with chronic leak, he may not proceed with chemotherapy. - I would contact Dr. Rush Landmark at [facility].  Together will make a plan whether he should go on chemotherapy prior to his planned exenteration.  2.  Neuropathy: -He has constant numbness in the feet which is stable.  3.  Rectal pain: -He is taking oxycodone 5 mg 1 and half tablets every 4 hours as needed.  Pain at times is not well controlled.  I will increase oxycodone to 10 mg every 4 hours as needed.  I have asked him to cut back on using ibuprofen as his creatinine is going up.   Total time spent is 40 minutes with more than 50% of the time spent face-to-face discussing results of the various tests done at [facility] clinic, further treatment plan and coordination of care.  Orders placed this encounter:  Orders Placed This Encounter  Procedures  . Urine culture  . Urinalysis, Routine w reflex microscopic      Derek Jack, MD [place] [phone]

## 2018-04-16 NOTE — Patient Instructions (Signed)
[place] at [facility] Discharge Instructions  Please return to the lab on Friday for a urine culture.   Thank you for choosing [place] at [facility] to provide your oncology and hematology care.  To afford each patient quality time with our provider, please arrive at least 15 minutes before your scheduled appointment time.   If you have a lab appointment with the [facility] please come in thru the  Main Entrance and check in at the main information desk  You need to re-schedule your appointment should you arrive 10 or more minutes late.  We strive to give you quality time with our providers, and arriving late affects you and other patients whose appointments are after yours.  Also, if you no show three or more times for appointments you may be dismissed from the clinic at the providers discretion.     Again, thank you for choosing [facility].  Our hope is that these requests will decrease the amount of time that you wait before being seen by our physicians.       _____________________________________________________________  Should you have questions after your visit to [facility], please contact our office at [phone] between the hours of 8:00 a.m. and 4:30 p.m.  Voicemails left after 4:00 p.m. will not be returned until the following business day.  For prescription refill requests, have your pharmacy contact our office and allow 72 hours.    Cancer Center Support Programs:   > Cancer Support Group  2nd Tuesday of the month 1pm-2pm, Journey Room

## 2018-04-18 ENCOUNTER — Ambulatory Visit (HOSPITAL_COMMUNITY): Payer: Self-pay | Admitting: Hematology

## 2018-04-19 ENCOUNTER — Other Ambulatory Visit (HOSPITAL_COMMUNITY): Payer: Self-pay | Admitting: *Deleted

## 2018-04-19 ENCOUNTER — Inpatient Hospital Stay (HOSPITAL_COMMUNITY): Payer: BLUE CROSS/BLUE SHIELD

## 2018-04-19 DIAGNOSIS — C20 Malignant neoplasm of rectum: Secondary | ICD-10-CM | POA: Diagnosis not present

## 2018-04-19 DIAGNOSIS — N390 Urinary tract infection, site not specified: Secondary | ICD-10-CM

## 2018-04-19 LAB — URINALYSIS, ROUTINE W REFLEX MICROSCOPIC
BACTERIA UA: NONE SEEN
BILIRUBIN URINE: NEGATIVE
Glucose, UA: NEGATIVE mg/dL
Hgb urine dipstick: NEGATIVE
KETONES UR: NEGATIVE mg/dL
NITRITE: NEGATIVE
PH: 5 (ref 5.0–8.0)
PROTEIN: 30 mg/dL — AB
Specific Gravity, Urine: 1.025 (ref 1.005–1.030)

## 2018-04-20 LAB — URINE CULTURE: CULTURE: NO GROWTH

## 2018-04-25 ENCOUNTER — Other Ambulatory Visit (HOSPITAL_COMMUNITY): Payer: Self-pay | Admitting: *Deleted

## 2018-04-25 DIAGNOSIS — K6289 Other specified diseases of anus and rectum: Secondary | ICD-10-CM

## 2018-04-25 MED ORDER — OXYCODONE HCL 10 MG PO TABS: 10.0000 mg | ORAL_TABLET | ORAL | 0 refills | Status: DC | PRN

## 2018-05-02 ENCOUNTER — Encounter (HOSPITAL_COMMUNITY): Payer: Self-pay | Admitting: *Deleted

## 2018-05-02 NOTE — Progress Notes (Signed)
I called and spoke with patient and significant other today to advise that I have not received any records from [facility] as of today.  Jonathan Wright states that he will call tomorrow and attempt to get them.  He will also request a CD with images from last scans.  We will send the images to [facility] for review and second opinion.    He advises that [facility] has put him on the surgery schedule October 17th. We will still wait for [facility] review and opinion before chemotherapy prior surgery.

## 2018-05-06 ENCOUNTER — Encounter (HOSPITAL_COMMUNITY): Payer: Self-pay | Admitting: *Deleted

## 2018-05-06 NOTE — Progress Notes (Signed)
I called [facility] today and they state that they faxed [place] the records last week.  At this time, I have not received any records from them.  They ask that I refax the request.  I had also requested CD with MRI and CT scan and they could not advise whether this had been sent or not.  I contacted films release department at [facility] and they state they need a separate release form.  I have faxed this to them as of today and they will send the CD's to [place].

## 2018-05-09 ENCOUNTER — Other Ambulatory Visit (HOSPITAL_COMMUNITY): Payer: Self-pay | Admitting: Nurse Practitioner

## 2018-05-09 ENCOUNTER — Other Ambulatory Visit (HOSPITAL_COMMUNITY): Payer: Self-pay | Admitting: *Deleted

## 2018-05-09 ENCOUNTER — Encounter (HOSPITAL_COMMUNITY): Payer: Self-pay | Admitting: *Deleted

## 2018-05-09 DIAGNOSIS — N390 Urinary tract infection, site not specified: Secondary | ICD-10-CM

## 2018-05-09 DIAGNOSIS — K6289 Other specified diseases of anus and rectum: Secondary | ICD-10-CM

## 2018-05-09 MED ORDER — SULFAMETHOXAZOLE-TRIMETHOPRIM 800-160 MG PO TABS: 1.0000 | ORAL_TABLET | Freq: Two times a day (BID) | ORAL | 0 refills | Status: DC

## 2018-05-09 MED ORDER — OXYCODONE HCL 10 MG PO TABS: 10.0000 mg | ORAL_TABLET | ORAL | 0 refills | Status: DC | PRN

## 2018-05-09 NOTE — Progress Notes (Signed)
Images from [facility] received via ground mail today.  Images were loaded onto the [facility] website.  I contacted their office [phone] to order a compare images and second opinion for Dr. Barnabas Harries.  I asked that results and further recommendations be sent to Dr. Delton Coombes at our office.

## 2018-05-16 ENCOUNTER — Other Ambulatory Visit (HOSPITAL_COMMUNITY): Payer: Self-pay | Admitting: *Deleted

## 2018-05-16 MED ORDER — SULFAMETHOXAZOLE-TRIMETHOPRIM 800-160 MG PO TABS: 1.0000 | ORAL_TABLET | Freq: Every day | ORAL | 1 refills | Status: DC

## 2018-05-16 NOTE — Telephone Encounter (Signed)
Per Dr. Delton Coombes, patient is to continue taking Bactrim once daily for prevention.  Script sent to pharmacy.

## 2018-05-17 ENCOUNTER — Other Ambulatory Visit (HOSPITAL_COMMUNITY): Payer: Self-pay | Admitting: Nurse Practitioner

## 2018-05-17 ENCOUNTER — Other Ambulatory Visit (HOSPITAL_COMMUNITY): Payer: Self-pay | Admitting: *Deleted

## 2018-05-17 DIAGNOSIS — K6289 Other specified diseases of anus and rectum: Secondary | ICD-10-CM

## 2018-05-17 DIAGNOSIS — C20 Malignant neoplasm of rectum: Secondary | ICD-10-CM

## 2018-05-17 MED ORDER — OXYCODONE HCL ER 20 MG PO T12A: 20.0000 mg | EXTENDED_RELEASE_TABLET | Freq: Two times a day (BID) | ORAL | 0 refills | Status: DC

## 2018-05-17 MED ORDER — ALPRAZOLAM 1 MG PO TABS: 0.5000 mg | ORAL_TABLET | Freq: Two times a day (BID) | ORAL | 0 refills | Status: DC | PRN

## 2018-05-17 MED ORDER — OXYCODONE HCL 10 MG PO TABS: 10.0000 mg | ORAL_TABLET | ORAL | 0 refills | Status: DC | PRN

## 2018-05-17 NOTE — Progress Notes (Signed)
Oxyc

## 2018-05-20 ENCOUNTER — Inpatient Hospital Stay (HOSPITAL_COMMUNITY): Payer: BLUE CROSS/BLUE SHIELD | Attending: Hematology

## 2018-05-20 ENCOUNTER — Other Ambulatory Visit (HOSPITAL_COMMUNITY): Payer: Self-pay | Admitting: *Deleted

## 2018-05-20 ENCOUNTER — Other Ambulatory Visit (HOSPITAL_COMMUNITY): Payer: Self-pay | Admitting: Nurse Practitioner

## 2018-05-20 ENCOUNTER — Telehealth (HOSPITAL_COMMUNITY): Payer: Self-pay | Admitting: Nurse Practitioner

## 2018-05-20 DIAGNOSIS — C20 Malignant neoplasm of rectum: Secondary | ICD-10-CM

## 2018-05-20 LAB — COMPREHENSIVE METABOLIC PANEL
ALK PHOS: 149 U/L — AB (ref 38–126)
ALT: 21 U/L (ref 0–44)
AST: 17 U/L (ref 15–41)
Albumin: 3.3 g/dL — ABNORMAL LOW (ref 3.5–5.0)
Anion gap: 8 (ref 5–15)
BUN: 20 mg/dL (ref 6–20)
CALCIUM: 9 mg/dL (ref 8.9–10.3)
CO2: 22 mmol/L (ref 22–32)
CREATININE: 1.72 mg/dL — AB (ref 0.61–1.24)
Chloride: 103 mmol/L (ref 98–111)
GFR, EST AFRICAN AMERICAN: 51 mL/min — AB (ref >60)
GFR, EST NON AFRICAN AMERICAN: 44 mL/min — AB (ref >60)
Glucose, Bld: 153 mg/dL — ABNORMAL HIGH (ref 70–99)
Potassium: 4.1 mmol/L (ref 3.5–5.1)
Sodium: 133 mmol/L — ABNORMAL LOW (ref 135–145)
Total Bilirubin: 0.4 mg/dL (ref 0.3–1.2)
Total Protein: 8.3 g/dL — ABNORMAL HIGH (ref 6.5–8.1)

## 2018-05-20 LAB — CBC WITH DIFFERENTIAL/PLATELET
BASOS PCT: 1 %
Basophils Absolute: 0.1 10*3/uL (ref 0.0–0.1)
EOS ABS: 0.3 10*3/uL (ref 0.0–0.7)
EOS PCT: 3 %
HCT: 35 % — ABNORMAL LOW (ref 39.0–52.0)
HEMOGLOBIN: 10.8 g/dL — AB (ref 13.0–17.0)
Lymphocytes Relative: 11 %
Lymphs Abs: 1.1 10*3/uL (ref 0.7–4.0)
MCH: 27.2 pg (ref 26.0–34.0)
MCHC: 30.9 g/dL (ref 30.0–36.0)
MCV: 88.2 fL (ref 78.0–100.0)
Monocytes Absolute: 1 10*3/uL (ref 0.1–1.0)
Monocytes Relative: 10 %
NEUTROS PCT: 75 %
Neutro Abs: 7.9 10*3/uL — ABNORMAL HIGH (ref 1.7–7.7)
PLATELETS: 426 10*3/uL — AB (ref 150–400)
RBC: 3.97 MIL/uL — ABNORMAL LOW (ref 4.22–5.81)
RDW: 15.9 % — AB (ref 11.5–15.5)
WBC: 10.4 10*3/uL (ref 4.0–10.5)

## 2018-05-20 NOTE — Telephone Encounter (Signed)
Requested PA for oxycontin 20mg  on CMM. PA APPROVED

## 2018-05-21 ENCOUNTER — Other Ambulatory Visit (HOSPITAL_COMMUNITY): Payer: Self-pay | Admitting: *Deleted

## 2018-05-21 DIAGNOSIS — C20 Malignant neoplasm of rectum: Secondary | ICD-10-CM

## 2018-05-22 ENCOUNTER — Ambulatory Visit (HOSPITAL_COMMUNITY): Payer: BLUE CROSS/BLUE SHIELD

## 2018-05-22 ENCOUNTER — Ambulatory Visit (HOSPITAL_COMMUNITY): Admission: RE | Admit: 2018-05-22 | Payer: BLUE CROSS/BLUE SHIELD | Source: Ambulatory Visit

## 2018-05-23 ENCOUNTER — Telehealth (HOSPITAL_COMMUNITY): Payer: Self-pay | Admitting: Hematology

## 2018-05-23 NOTE — Telephone Encounter (Signed)
FAXED APPEAL FOR DENIED MRI TO [facility]

## 2018-05-24 ENCOUNTER — Ambulatory Visit (HOSPITAL_COMMUNITY): Payer: BLUE CROSS/BLUE SHIELD

## 2018-05-24 ENCOUNTER — Ambulatory Visit (HOSPITAL_COMMUNITY): Payer: Self-pay | Admitting: Hematology

## 2018-05-29 ENCOUNTER — Ambulatory Visit (HOSPITAL_COMMUNITY)
Admission: RE | Admit: 2018-05-29 | Discharge: 2018-05-29 | Disposition: A | Discharge status: Home / Self Care | Payer: BLUE CROSS/BLUE SHIELD | Source: Ambulatory Visit | Attending: Nurse Practitioner | Admitting: Nurse Practitioner

## 2018-05-29 DIAGNOSIS — R933 Abnormal findings on diagnostic imaging of other parts of digestive tract: Secondary | ICD-10-CM | POA: Insufficient documentation

## 2018-05-29 DIAGNOSIS — I2510 Atherosclerotic heart disease of native coronary artery without angina pectoris: Secondary | ICD-10-CM | POA: Diagnosis not present

## 2018-05-29 DIAGNOSIS — C20 Malignant neoplasm of rectum: Secondary | ICD-10-CM

## 2018-05-29 MED ORDER — GADOBUTROL 1 MMOL/ML IV SOLN
7.0000 mL | Freq: Once | INTRAVENOUS | Status: AC | PRN
  Administered 2018-05-29: 7 mL via INTRAVENOUS

## 2018-05-31 ENCOUNTER — Encounter (HOSPITAL_COMMUNITY): Payer: Self-pay | Admitting: Hematology

## 2018-05-31 ENCOUNTER — Ambulatory Visit (HOSPITAL_COMMUNITY): Payer: BLUE CROSS/BLUE SHIELD

## 2018-05-31 ENCOUNTER — Ambulatory Visit (HOSPITAL_COMMUNITY): Payer: Self-pay | Admitting: Hematology

## 2018-05-31 ENCOUNTER — Ambulatory Visit (HOSPITAL_COMMUNITY)
Admission: RE | Admit: 2018-05-31 | Discharge: 2018-05-31 | Disposition: A | Discharge status: Home / Self Care | Payer: BLUE CROSS/BLUE SHIELD | Source: Ambulatory Visit | Attending: Nurse Practitioner | Admitting: Nurse Practitioner

## 2018-05-31 ENCOUNTER — Other Ambulatory Visit (HOSPITAL_COMMUNITY): Payer: BLUE CROSS/BLUE SHIELD

## 2018-05-31 ENCOUNTER — Inpatient Hospital Stay (HOSPITAL_COMMUNITY): Payer: BLUE CROSS/BLUE SHIELD | Attending: Hematology | Admitting: Hematology

## 2018-05-31 DIAGNOSIS — G629 Polyneuropathy, unspecified: Secondary | ICD-10-CM | POA: Diagnosis not present

## 2018-05-31 DIAGNOSIS — G893 Neoplasm related pain (acute) (chronic): Secondary | ICD-10-CM | POA: Diagnosis not present

## 2018-05-31 DIAGNOSIS — C20 Malignant neoplasm of rectum: Secondary | ICD-10-CM

## 2018-05-31 DIAGNOSIS — R5383 Other fatigue: Secondary | ICD-10-CM | POA: Diagnosis not present

## 2018-05-31 DIAGNOSIS — Z9221 Personal history of antineoplastic chemotherapy: Secondary | ICD-10-CM | POA: Diagnosis not present

## 2018-05-31 DIAGNOSIS — Z923 Personal history of irradiation: Secondary | ICD-10-CM | POA: Diagnosis not present

## 2018-05-31 DIAGNOSIS — N261 Atrophy of kidney (terminal): Secondary | ICD-10-CM | POA: Insufficient documentation

## 2018-05-31 DIAGNOSIS — N1330 Unspecified hydronephrosis: Secondary | ICD-10-CM | POA: Diagnosis not present

## 2018-05-31 DIAGNOSIS — Z79899 Other long term (current) drug therapy: Secondary | ICD-10-CM | POA: Diagnosis not present

## 2018-05-31 DIAGNOSIS — F418 Other specified anxiety disorders: Secondary | ICD-10-CM | POA: Diagnosis not present

## 2018-05-31 DIAGNOSIS — R531 Weakness: Secondary | ICD-10-CM

## 2018-05-31 MED ORDER — GADOBUTROL 1 MMOL/ML IV SOLN
7.0000 mL | Freq: Once | INTRAVENOUS | Status: AC | PRN
  Administered 2018-05-31: 7 mL via INTRAVENOUS

## 2018-05-31 NOTE — Progress Notes (Signed)
[address], [place] [address]   CLINIC:  Medical Oncology/Hematology  PCP:  [name], MD [address] [place] [address] [phone]   REASON FOR VISIT: Follow-up for rectal adenocarcinoma  CURRENT THERAPY: surgery  BRIEF ONCOLOGIC HISTORY:  Oncology History   Stage IIIB rectal adenocarcinoma, mT2N2M0 Treated with 5Gy x 5 at [facility]     Rectal cancer [facility])   09/20/2015 Procedure    colonoscopy with firm rectal mass and 4 cm polyp in proximal rectum, infiltrating non-obstructing large mass within the distal rectum extending towards anal verge    09/20/2015 Miscellaneous    Microsatellite stable.     09/28/2015 Imaging    CT C/A/P no evidence of distant metastatic disease. irregular thickening of the low rectum and anal canal, several enlarged perirectal LN, indeterminate 44m RUL nodule    09/28/2015 Imaging    MRI pelvis assym diffuse circum lobular thickening of rectum, near full wall thickness extension along lower anterior rectum W/O extra serosal extension c/w known adeno, mult. perirectal LN    12/06/2015 - 12/10/2015 Radiation Therapy    [facility] Dr. AAngelina Ok  2500.00 cGy, 500.00 cGy per day in 5 fractions delivered 1x per day to the 96.0% Isodose line Treatment Dates: 12/06/2015 through 12/10/2015.     01/03/2016 Procedure    PICC placed    01/04/2016 - 03/14/2016 Chemotherapy    FOLFOX x 6 cycles    06/07/2016 Definitive Surgery    Robotic assisted APR with coloanal anastomosis [facility]   06/19/2016 - 06/23/2016 Hospital Admission    Admit date: 06/19/2016 Admission diagnosis: Sepsis Additional comments: Sepsis due to multiple gram-negative active uremia. Likely source [facility] general surgery following, perirectal abscess draining by itself, white count and temperatures improved. He had a PICC  line from prior to this admission which was line removed on 06/21/2016, vancomycin stopped on 06/22/2016, He tolerated test dose of Rocephin well on 06/22/2016 without any issues, [facility], requested to follow with PCP in 3 days to make sure he is tolerating oral medications well and has continued to defervesce, request PCP to please check final culture and sensitivity results which should be back in 3 days. He will follow with general surgery outpatient as well.    02/15/2017 Imaging    CT CAP- 1. Well-formed presacral abscess is decreased in size but persistent. 2. No evidence of colorectal cancer metastasis in the abdomen pelvis. 3. Loop ileostomy in the RIGHT lower quadrant.    04/05/2017 Imaging    CT CAP-IMPRESSION: 1. Status post loop right-sided ileostomy with sigmoid to anal anastomosis. 2. Decrease in presacral fluid collection, consistent with abscess. New foci of extraluminal gas, suspicious for fistulous communication to the lower sigmoid. 3. No evidence of metastatic disease.    04/20/2017 Imaging    MRI pelvis w/ and w/o contrast IMPRESSION: 1. No appreciable change in chronic complex presacral space abscess, which demonstrates four separate sites of fistulous communication to the sigmoid colon and colo-anal anastomosis, as detailed. No discrete mass or other findings to suggest local tumor recurrence. 2. No evidence of metastatic disease in the pelvis.      CANCER STAGING: Cancer Staging Rectal cancer ([facility] Staging form: Colon and Rectum, AJCC 7th Edition - Clinical stage from 01/03/2016: Stage IIIB (  T2, N2a, M0) - Signed by Baird Cancer, PA-C on 01/03/2016    INTERVAL HISTORY:  Mr. Sandiford 52 y.o. male returns for routine follow-up for rectal adenocarcinoma. Patient is here with his partner. He still has pain that is constant. He has fatigue and weakness. He is having his surgery on  Thursday. His drain puts out about 20cc daily of serous fluid. He is on continuous antibiotics for infection. He denies any new symptoms.     REVIEW OF SYSTEMS:  Review of Systems  Constitutional: Positive for fatigue.  Gastrointestinal: Positive for rectal pain.  Neurological: Positive for extremity weakness.  All other systems reviewed and are negative.    PAST MEDICAL/SURGICAL HISTORY:  Past Medical History:  Diagnosis Date  . Anxiety   . Depression   . Rectal cancer ([place]) 12/17/2015   Past Surgical History:  Procedure Laterality Date  . ABDOMINAL PERINEAL BOWEL RESECTION  06/07/2016   [facility]  . COLON SURGERY    . DIVERTING ILEOSTOMY  06/07/2016   [facility]  . LAPAROSCOPIC LOW ANTERIOR RESCECTION WITH COLOANAL ANASTOMOSIS  06/07/2016   [facility]  . PERIPHERALLY INSERTED CENTRAL CATHETER INSERTION  11/2015     SOCIAL HISTORY:  Social History   Socioeconomic History  . Marital status: Divorced    Spouse name: Not on file  . Number of children: Not on file  . Years of education: Not on file  . Highest education level: Not on file  Occupational History  . Not on file  Social Needs  . Financial resource strain: Not on file  . Food insecurity:    Worry: Not on file    Inability: Not on file  . Transportation needs:    Medical: Not on file    Non-medical: Not on file  Tobacco Use  . Smoking status: Never Smoker  . Smokeless tobacco: Never Used  Substance and Sexual Activity  . Alcohol use: Yes    Comment: occ.   . Drug use: No  . Sexual activity: Not on file  Lifestyle  . Physical activity:    Days per week: Not on file    Minutes per session: Not on file  . Stress: Not on file  Relationships  . Social connections:    Talks on phone: Not on file    Gets together: Not on file    Attends religious service: Not on file    Active member of club or organization: Not on file    Attends meetings of clubs or organizations: Not on file     Relationship status: Not on file  . Intimate partner violence:    Fear of current or ex partner: Not on file    Emotionally abused: Not on file    Physically abused: Not on file    Forced sexual activity: Not on file  Other Topics Concern  . Not on file  Social History Narrative  . Not on file    FAMILY HISTORY:  Family History  Problem Relation Age of Onset  . Rectal cancer Neg Hx     CURRENT MEDICATIONS:  Outpatient Encounter Medications as of 05/31/2018  Medication Sig  . ALPRAZolam (XANAX) 1 MG tablet Take 0.5 tablets (0.5 mg total) by mouth 2 (two) times daily as needed for anxiety.  . ciprofloxacin (CIPRO) 500 MG tablet Take 1 tablet (500 mg total) by mouth 2 (two) times daily. (Patient not taking: Reported on 04/16/2018)  . nitrofurantoin, macrocrystal-monohydrate, (MACROBID) 100 MG capsule Take 1 capsule (  100 mg total) by mouth daily. (Patient not taking: Reported on 04/16/2018)  . ondansetron (ZOFRAN) 8 MG tablet Take 0.5 tablets (4 mg total) by mouth every 8 (eight) hours as needed for nausea or vomiting. (Patient not taking: Reported on 04/16/2018)  . oxyCODONE (OXYCONTIN) 20 mg 12 hr tablet Take 1 tablet (20 mg total) by mouth every 12 (twelve) hours.  . Oxycodone HCl 10 MG TABS Take 1 tablet (10 mg total) by mouth every 4 (four) hours as needed.  . sulfamethoxazole-trimethoprim (BACTRIM DS,SEPTRA DS) 800-160 MG tablet Take 1 tablet by mouth daily.   Facility-Administered Encounter Medications as of 05/31/2018  Medication  . sodium chloride flush (NS) 0.9 % injection 10 mL    ALLERGIES:  Allergies  Allergen Reactions  . Penicillins Hives    Has patient had a PCN reaction causing immediate rash, facial/tongue/throat swelling, SOB or lightheadedness with hypotension: Yes Has patient had a PCN reaction causing severe rash involving mucus membranes or skin necrosis: No Has patient had a PCN reaction that required hospitalization No Has patient had a PCN reaction  occurring within the last 10 years: No If all of the above answers are "NO", then may proceed with Cephalosporin use.   Marland Kitchen Penicillin G Other (See Comments)     PHYSICAL EXAM:  ECOG Performance status: 1  Vitals:   05/31/18 1233  BP: (!) 124/96  Pulse: (!) 120  Resp: 20  Temp: 98.7 F (37.1 C)  SpO2: 100%   Filed Weights   05/31/18 1233  Weight: 155 lb 4.8 oz (70.4 kg)    Physical Exam  Constitutional: He is oriented to person, place, and time. He appears well-developed and well-nourished.  Musculoskeletal: Normal range of motion.  Neurological: He is alert and oriented to person, place, and time.  Skin: Skin is warm and dry.  Psychiatric: He has a normal mood and affect. His behavior is normal. Judgment and thought content normal.     LABORATORY DATA:  I have reviewed the labs as listed.  CBC    Component Value Date/Time   WBC 10.4 05/20/2018 1310   RBC 3.97 (L) 05/20/2018 1310   HGB 10.8 (L) 05/20/2018 1310   HCT 35.0 (L) 05/20/2018 1310   PLT 426 (H) 05/20/2018 1310   MCV 88.2 05/20/2018 1310   MCH 27.2 05/20/2018 1310   MCHC 30.9 05/20/2018 1310   RDW 15.9 (H) 05/20/2018 1310   LYMPHSABS 1.1 05/20/2018 1310   MONOABS 1.0 05/20/2018 1310   EOSABS 0.3 05/20/2018 1310   BASOSABS 0.1 05/20/2018 1310   CMP Latest Ref Rng & Units 05/20/2018 12/25/2017 10/25/2017  Glucose 70 - 99 mg/dL 153(H) 101(H) 131(H)  BUN 6 - 20 mg/dL 20 13 11   Creatinine 0.61 - 1.24 mg/dL 1.72(H) 0.96 0.87  Sodium 135 - 145 mmol/L 133(L) 139 141  Potassium 3.5 - 5.1 mmol/L 4.1 3.9 4.1  Chloride 98 - 111 mmol/L 103 105 105  CO2 22 - 32 mmol/L 22 25 23   Calcium 8.9 - 10.3 mg/dL 9.0 9.5 9.6  Total Protein 6.5 - 8.1 g/dL 8.3(H) 7.8 8.3(H)  Total Bilirubin 0.3 - 1.2 mg/dL 0.4 0.7 0.6  Alkaline Phos 38 - 126 U/L 149(H) 72 112  AST 15 - 41 U/L 17 15 21   ALT 0 - 44 U/L 21 11(L) 25       DIAGNOSTIC IMAGING:  I have independently reviewed images of the CT and MRI.     ASSESSMENT &  PLAN:   Rectal cancer ([place])  1.  Locally advanced rectal cancer: - Stage IIIb (T2N2) rectal adenocarcinoma, treated with radiation 5 Gy over 5 fractions at [facility], FOLFOX 6 cycles from 01/04/2016 through 03/14/2016, status post robotic APR with coloanal anastomosis and diverting loop ileostomy on 06/07/2016. - Post anastomotic dehiscence in October 2017 with pelvic abscess - Rectal biopsy on 03/19/2017 with invasive moderately differentiated adenocarcinoma, rebiopsy in October 2018 with local recurrence at anastomosis associated with an nonhealing pelvic abscess - Chemoradiation therapy with Xeloda from 07/09/2017 through 08/01/2017, 30.6 Gray in 17 fractions at [place] on 12/11/2017 showing decrease in presacral fluid collection, second opinion reading at [facility] concerning for recurrence of tumor invading into the left seminal vesicle - I have reviewed the results of the PET CT scan dated 01/04/2018.  It showed intense uptake in the presacral collection area.  There were 2 fistulous tracts identified.  No evidence of distant metastatic disease. - I have also talked to Janith Lima, NP for Dr. Rush Landmark, medical oncologist at [facility].  His team of doctors there are recommending 3 months of chemotherapy followed by surgery.  Given his peripheral neuropathy, I think he will do well with FOLFIRI.  Patient is very concerned about the extensive radical surgery that was proposed to him following chemotherapy.  He want to seek a second opinion by different surgeon. - He was seen by Dr. Percell Boston at [facility] clinic.  An MRI was repeated.  A CT-guided biopsy with CT-guided drain placement of the pelvic fluid collection was done on 03/22/2018.  The drain initially put out about 50 cc/day.  Right now it is putting out 25 cc/day.  His rectal discharge has stopped after the drain was placed.  The biopsy was consistent with adenocarcinoma. - He was then  referred to Dr. Suanne Marker and was presented at their local tumor board at [facility].  Pelvic exenteration with cystectomy, prostatectomy, resection of rectum/anus/anastomosis, the pelvic inflammation, pelvic sidewall with IORT, urinary reconstruction with ileal conduit and end colostomy (possible ileostomy takedown) and flap reconstruction.  He also cautioned that with chronic leak, he may not proceed with chemotherapy. -His drain is putting out about 10 to 20 cc/day of pus colored material. - I have reviewed the results of the CT scan of the chest on 05/29/2018 which did not show any metastatic disease.  We reviewed the MRI of the pelvis results which showed complex presacral fluid collection, relatively diffuse pelvic soft tissue thickening and hyperenhancement.  There is interval development of lower sacral and coccygeal signal abnormality and heterogeneous hyperenhancement suspicious for osseous tumor involvement.  There is new left hydronephrosis.  MRI of the abdomen did not show any evidence of metastatic disease. - I have also reached out to Dr. Rush Landmark at [facility] to discuss his case.  I have recommended that he proceed with surgery and see [place] back in 6 weeks for follow-up.  2.  Neuropathy: -He has constant numbness in the feet which is stable.  3.  Rectal pain: -His pain is controlled with OxyContin 20 mg every 12 hours and oxycodone 10 mg every 4 hours as needed.  4.  Anxiety: -he is taking Xanax twice daily for anxiety.  This is controlling it.      Orders placed this encounter:  No orders of the defined types were placed in this encounter.     Derek Jack, MD [place] [phone]

## 2018-05-31 NOTE — Patient Instructions (Signed)
[place] at [facility] Discharge Instructions  Follow up in 6 weeks    Thank you for choosing [place] at [facility] to provide your oncology and hematology care.  To afford each patient quality time with our provider, please arrive at least 15 minutes before your scheduled appointment time.   If you have a lab appointment with the [place] please come in thru the  Main Entrance and check in at the main information desk  You need to re-schedule your appointment should you arrive 10 or more minutes late.  We strive to give you quality time with our providers, and arriving late affects you and other patients whose appointments are after yours.  Also, if you no show three or more times for appointments you may be dismissed from the clinic at the providers discretion.     Again, thank you for choosing [facility].  Our hope is that these requests will decrease the amount of time that you wait before being seen by our physicians.       _____________________________________________________________  Should you have questions after your visit to [facility], please contact our office at [phone] between the hours of 8:00 a.m. and 4:30 p.m.  Voicemails left after 4:00 p.m. will not be returned until the following business day.  For prescription refill requests, have your pharmacy contact our office and allow 72 hours.    Cancer Center Support Programs:   > Cancer Support Group  2nd Tuesday of the month 1pm-2pm, Journey Room

## 2018-05-31 NOTE — Assessment & Plan Note (Addendum)
1.  Locally advanced rectal cancer: - Stage IIIb (T2N2) rectal adenocarcinoma, treated with radiation 5 Gy over 5 fractions at [facility], FOLFOX 6 cycles from 01/04/2016 through 03/14/2016, status post robotic APR with coloanal anastomosis and diverting loop ileostomy on 06/07/2016. - Post anastomotic dehiscence in October 2017 with pelvic abscess - Rectal biopsy on 03/19/2017 with invasive moderately differentiated adenocarcinoma, rebiopsy in October 2018 with local recurrence at anastomosis associated with an nonhealing pelvic abscess - Chemoradiation therapy with Xeloda from 07/09/2017 through 08/01/2017, 30.6 Gray in 17 fractions at [place] MRI on 12/11/2017 showing decrease in presacral fluid collection, second opinion reading at [facility] concerning for recurrence of tumor invading into the left seminal vesicle - I have reviewed the results of the PET CT scan dated 01/04/2018.  It showed intense uptake in the presacral collection area.  There were 2 fistulous tracts identified.  No evidence of distant metastatic disease. - I have also talked to Janith Lima, NP for Dr. Rush Landmark, medical oncologist at [facility].  His team of doctors there are recommending 3 months of chemotherapy followed by surgery.  Given his peripheral neuropathy, I think he will do well with FOLFIRI.  Patient is very concerned about the extensive radical surgery that was proposed to him following chemotherapy.  He want to seek a second opinion by different surgeon. - He was seen by Dr. Percell Boston at [facility] clinic.  An MRI was repeated.  A CT-guided biopsy with CT-guided drain placement of the pelvic fluid collection was done on 03/22/2018.  The drain initially put out about 50 cc/day.  Right now it is putting out 25 cc/day.  His rectal discharge has stopped after the drain was placed.  The biopsy was consistent with adenocarcinoma. - He was then referred to Dr. Suanne Marker and was  presented at their local tumor board at [facility].  Pelvic exenteration with cystectomy, prostatectomy, resection of rectum/anus/anastomosis, the pelvic inflammation, pelvic sidewall with IORT, urinary reconstruction with ileal conduit and end colostomy (possible ileostomy takedown) and flap reconstruction.  He also cautioned that with chronic leak, he may not proceed with chemotherapy. -His drain is putting out about 10 to 20 cc/day of pus colored material. - I have reviewed the results of the CT scan of the chest on 05/29/2018 which did not show any metastatic disease.  We reviewed the MRI of the pelvis results which showed complex presacral fluid collection, relatively diffuse pelvic soft tissue thickening and hyperenhancement.  There is interval development of lower sacral and coccygeal signal abnormality and heterogeneous hyperenhancement suspicious for osseous tumor involvement.  There is new left hydronephrosis.  MRI of the abdomen did not show any evidence of metastatic disease. - I have also reached out to Dr. Rush Landmark at [facility] to discuss his case.  I have recommended that he proceed with surgery and see [place] back in 6 weeks for follow-up.  2.  Neuropathy: -He has constant numbness in the feet which is stable.  3.  Rectal pain: -His pain is controlled with OxyContin 20 mg every 12 hours and oxycodone 10 mg every 4 hours as needed.  4.  Anxiety: -he is taking Xanax twice daily for anxiety.  This is controlling it.

## 2018-06-03 ENCOUNTER — Ambulatory Visit (HOSPITAL_COMMUNITY): Payer: Self-pay | Admitting: Hematology

## 2018-06-06 ENCOUNTER — Other Ambulatory Visit (HOSPITAL_COMMUNITY): Payer: Self-pay | Admitting: Hematology

## 2018-07-12 ENCOUNTER — Ambulatory Visit (HOSPITAL_COMMUNITY): Payer: Self-pay | Admitting: Hematology

## 2018-07-17 ENCOUNTER — Inpatient Hospital Stay (HOSPITAL_COMMUNITY): Payer: BLUE CROSS/BLUE SHIELD | Attending: Hematology | Admitting: *Deleted

## 2018-07-17 DIAGNOSIS — C20 Malignant neoplasm of rectum: Secondary | ICD-10-CM | POA: Diagnosis not present

## 2018-07-17 MED ORDER — ALTEPLASE 2 MG IJ SOLR
2.0000 mg | Freq: Once | INTRAMUSCULAR | Status: AC
  Administered 2018-07-17: 2 mg

## 2018-07-17 NOTE — Progress Notes (Signed)
Patient called clinic stating he was having trouble flushing picc line.  He is currently getting antibiotics three times daily prescribed by another physician.    Patient arrived to clinic, Picc line is located right forearm.  No drainage, no redness noted.  Red and purple port are clamped.    Unclamped lines, neither line would flush or draw back any blood.  After multiple attempts I was able to draw out blood from the red port and it flushed without difficulty. Still not able to flush the purple port.    Dr. Delton Coombes aware. Orders received for alteplase.  Alteplase administered via purple lumen at 1535.   1415: able to draw off 10 ml blood from both lumens and discarded. Flushed both lumens with 20 ml normal saline flush without difficulty. Changed caps on both lumens.   Patient discharged from clinic ambulatory and in stable condition.

## 2018-07-25 ENCOUNTER — Other Ambulatory Visit (HOSPITAL_COMMUNITY): Payer: Self-pay | Admitting: Nurse Practitioner

## 2018-07-25 ENCOUNTER — Other Ambulatory Visit (HOSPITAL_COMMUNITY): Payer: Self-pay | Admitting: *Deleted

## 2018-07-25 DIAGNOSIS — C20 Malignant neoplasm of rectum: Secondary | ICD-10-CM

## 2018-07-25 DIAGNOSIS — K611 Rectal abscess: Secondary | ICD-10-CM

## 2018-07-25 DIAGNOSIS — K6289 Other specified diseases of anus and rectum: Secondary | ICD-10-CM

## 2018-07-25 MED ORDER — OXYCODONE HCL ER 20 MG PO T12A: 20.0000 mg | EXTENDED_RELEASE_TABLET | Freq: Two times a day (BID) | ORAL | 0 refills | Status: DC

## 2018-07-25 MED ORDER — OXYCODONE HCL 10 MG PO TABS: 15.0000 mg | ORAL_TABLET | ORAL | 0 refills | Status: DC | PRN

## 2018-07-25 NOTE — Progress Notes (Signed)
Orders received from Nadara Mustard, [place] from [facility].  Once CT is resulted, orders are to be faxed to their office.  Dr. Delton Coombes aware and okay to order and schedule as soon as possible.

## 2018-07-26 ENCOUNTER — Ambulatory Visit (HOSPITAL_COMMUNITY): Payer: Self-pay | Admitting: Hematology

## 2018-07-31 ENCOUNTER — Telehealth (HOSPITAL_COMMUNITY): Payer: Self-pay

## 2018-07-31 NOTE — Telephone Encounter (Signed)
Patient notified pain prescription was faxed to pharmacy.  Patient verbalized understanding.

## 2018-08-01 ENCOUNTER — Ambulatory Visit (HOSPITAL_COMMUNITY)
Admission: RE | Admit: 2018-08-01 | Discharge: 2018-08-01 | Disposition: A | Discharge status: Home / Self Care | Payer: BLUE CROSS/BLUE SHIELD | Source: Ambulatory Visit | Attending: Hematology | Admitting: Hematology

## 2018-08-01 DIAGNOSIS — K611 Rectal abscess: Secondary | ICD-10-CM

## 2018-08-01 MED ORDER — SODIUM CHLORIDE (PF) 0.9 % IJ SOLN
INTRAMUSCULAR | Status: AC
  Filled 2018-08-01: qty 50

## 2018-08-01 MED ORDER — IOHEXOL 300 MG/ML  SOLN
100.0000 mL | Freq: Once | INTRAMUSCULAR | Status: AC | PRN
  Administered 2018-08-01: 100 mL via INTRAVENOUS

## 2018-08-01 MED ORDER — HEPARIN SOD (PORK) LOCK FLUSH 100 UNIT/ML IV SOLN
INTRAVENOUS | Status: AC
  Filled 2018-08-01: qty 5

## 2018-08-01 MED ORDER — HEPARIN SOD (PORK) LOCK FLUSH 100 UNIT/ML IV SOLN
500.0000 [IU] | Freq: Once | INTRAVENOUS | Status: AC
  Administered 2018-08-01: 500 [IU] via INTRAVENOUS

## 2018-08-02 ENCOUNTER — Ambulatory Visit (HOSPITAL_COMMUNITY): Payer: Self-pay | Admitting: Hematology

## 2018-08-02 ENCOUNTER — Encounter (HOSPITAL_COMMUNITY): Payer: Self-pay | Admitting: *Deleted

## 2018-08-02 NOTE — Progress Notes (Signed)
I spoke with Jonathan December, RN with Dr. Rowe Robert at [facility].  She received the CT scan results that were faxed to their office yesterday.  She states that Dr. Rowe Robert gave orders to maintain drain and continue flushing protocol (pt is aware no more than twice daily) until the drainage is less than 30 cc daily.  A NP or MD can pull the drain once drainage is decreased.  Keep site covered with gauze at it will continue to drain.  If patient notices increase in output or drastic change in characteristic their office is to be notified immediately.    I sent a message with the above orders to Dr. Delton Coombes. Patient has follow up next week and he will address the drain at that time.    Patient is aware of appointment date and time and verbalizes understanding.

## 2018-08-06 ENCOUNTER — Inpatient Hospital Stay (HOSPITAL_COMMUNITY): Payer: BLUE CROSS/BLUE SHIELD

## 2018-08-06 ENCOUNTER — Other Ambulatory Visit (HOSPITAL_COMMUNITY): Payer: Self-pay | Admitting: *Deleted

## 2018-08-06 ENCOUNTER — Encounter (HOSPITAL_COMMUNITY): Payer: Self-pay | Admitting: Hematology

## 2018-08-06 ENCOUNTER — Inpatient Hospital Stay (HOSPITAL_COMMUNITY): Payer: BLUE CROSS/BLUE SHIELD | Attending: Hematology | Admitting: Hematology

## 2018-08-06 VITALS — BP 97/68 | HR 97 | Temp 98.5°F | Resp 18 | Wt 155.1 lb

## 2018-08-06 DIAGNOSIS — F419 Anxiety disorder, unspecified: Secondary | ICD-10-CM | POA: Insufficient documentation

## 2018-08-06 DIAGNOSIS — C20 Malignant neoplasm of rectum: Secondary | ICD-10-CM

## 2018-08-06 DIAGNOSIS — Z923 Personal history of irradiation: Secondary | ICD-10-CM | POA: Diagnosis not present

## 2018-08-06 DIAGNOSIS — Z79899 Other long term (current) drug therapy: Secondary | ICD-10-CM | POA: Diagnosis not present

## 2018-08-06 DIAGNOSIS — Z9221 Personal history of antineoplastic chemotherapy: Secondary | ICD-10-CM | POA: Insufficient documentation

## 2018-08-06 DIAGNOSIS — K6289 Other specified diseases of anus and rectum: Secondary | ICD-10-CM

## 2018-08-06 LAB — CBC WITH DIFFERENTIAL/PLATELET
Abs Immature Granulocytes: 0.03 10*3/uL (ref 0.00–0.07)
BASOS ABS: 0.1 10*3/uL (ref 0.0–0.1)
Basophils Relative: 1 %
EOS PCT: 8 %
Eosinophils Absolute: 0.4 10*3/uL (ref 0.0–0.5)
HEMATOCRIT: 37.2 % — AB (ref 39.0–52.0)
HEMOGLOBIN: 11.2 g/dL — AB (ref 13.0–17.0)
Immature Granulocytes: 1 %
LYMPHS ABS: 0.8 10*3/uL (ref 0.7–4.0)
LYMPHS PCT: 14 %
MCH: 27.7 pg (ref 26.0–34.0)
MCHC: 30.1 g/dL (ref 30.0–36.0)
MCV: 92.1 fL (ref 80.0–100.0)
MONO ABS: 0.8 10*3/uL (ref 0.1–1.0)
MONOS PCT: 14 %
NRBC: 0 % (ref 0.0–0.2)
Neutro Abs: 3.5 10*3/uL (ref 1.7–7.7)
Neutrophils Relative %: 62 %
Platelets: 305 10*3/uL (ref 150–400)
RBC: 4.04 MIL/uL — ABNORMAL LOW (ref 4.22–5.81)
RDW: 15.8 % — ABNORMAL HIGH (ref 11.5–15.5)
WBC: 5.6 10*3/uL (ref 4.0–10.5)

## 2018-08-06 LAB — COMPREHENSIVE METABOLIC PANEL
ALBUMIN: 3.4 g/dL — AB (ref 3.5–5.0)
ALK PHOS: 93 U/L (ref 38–126)
ALT: 18 U/L (ref 0–44)
AST: 19 U/L (ref 15–41)
Anion gap: 8 (ref 5–15)
BUN: 18 mg/dL (ref 6–20)
CALCIUM: 9 mg/dL (ref 8.9–10.3)
CHLORIDE: 108 mmol/L (ref 98–111)
CO2: 21 mmol/L — AB (ref 22–32)
CREATININE: 1.08 mg/dL (ref 0.61–1.24)
GFR calc Af Amer: >60 mL/min (ref >60)
GFR calc non Af Amer: >60 mL/min (ref >60)
GLUCOSE: 134 mg/dL — AB (ref 70–99)
Potassium: 4 mmol/L (ref 3.5–5.1)
SODIUM: 137 mmol/L (ref 135–145)
Total Bilirubin: 0.1 mg/dL — ABNORMAL LOW (ref 0.3–1.2)
Total Protein: 7.2 g/dL (ref 6.5–8.1)

## 2018-08-06 MED ORDER — ALPRAZOLAM 1 MG PO TABS: 0.5000 mg | ORAL_TABLET | Freq: Two times a day (BID) | ORAL | 0 refills | Status: DC | PRN

## 2018-08-06 NOTE — Assessment & Plan Note (Addendum)
1.  Locally advanced rectal cancer: - Stage IIIb (T2N2) rectal adenocarcinoma, treated with radiation 5 Gy over 5 fractions at [facility], FOLFOX 6 cycles from 01/04/2016 through 03/14/2016, status post robotic APR with coloanal anastomosis and diverting loop ileostomy on 06/07/2016. - Post anastomotic dehiscence in October 2017 with pelvic abscess - Rectal biopsy on 03/19/2017 with invasive moderately differentiated adenocarcinoma, rebiopsy in October 2018 with local recurrence at anastomosis associated with an nonhealing pelvic abscess - Chemoradiation therapy with Xeloda from 07/09/2017 through 08/01/2017, 30.6 Gray in 17 fractions at [facility] on 12/11/2017 showing decrease in presacral fluid collection, second opinion reading at [facility] concerning for recurrence of tumor invading into the left seminal vesicle - I have reviewed the results of the PET CT scan dated 01/04/2018.  It showed intense uptake in the presacral collection area.  There were 2 fistulous tracts identified.  No evidence of distant metastatic disease. - I have also talked to Janith Lima, NP for Dr. Rush Landmark, medical oncologist at [facility].  His team of doctors there are recommending 3 months of chemotherapy followed by surgery.  Given his peripheral neuropathy, I think he will do well with FOLFIRI.  Patient is very concerned about the extensive radical surgery that was proposed to him following chemotherapy.  He want to seek a second opinion by different surgeon. - He was seen by Dr. Percell Boston at [facility] clinic.  An MRI was repeated.  A CT-guided biopsy with CT-guided drain placement of the pelvic fluid collection was done on 03/22/2018.  The drain initially put out about 50 cc/day.  Right now it is putting out 25 cc/day.  His rectal discharge has stopped after the drain was placed.  The biopsy was consistent with adenocarcinoma. - He was then referred to Dr. Suanne Marker and was  presented at their local tumor board at [facility].  Pelvic exenteration with cystectomy, prostatectomy, resection of rectum/anus/anastomosis, the pelvic inflammation, pelvic sidewall with IORT, urinary reconstruction with ileal conduit and end colostomy (possible ileostomy takedown) and flap reconstruction.  He also cautioned that with chronic leak, he may not proceed with chemotherapy. -His drain is putting out about 10 to 20 cc/day of pus colored material. - I have reviewed the results of the CT scan of the chest on 05/29/2018 which did not show any metastatic disease.  We reviewed the MRI of the pelvis results which showed complex presacral fluid collection, relatively diffuse pelvic soft tissue thickening and hyperenhancement.  There is interval development of lower sacral and coccygeal signal abnormality and heterogeneous hyperenhancement suspicious for osseous tumor involvement.  There is new left hydronephrosis.  MRI of the abdomen did not show any evidence of metastatic disease. - He underwent pelvic exenteration surgery on 06/06/2018.  He has a positive radial margin. -His drain fell off on Saturday.  He does have sacral pain. -I reviewed the results of the CT abdomen and pelvis dated 08/01/2018 which showed decrease in size and complexity of the presacral fluid collection.  No evidence of abdominal metastatic disease.  Marrow heterogeneity and cortical destruction involving the coccyx. - I have recommended doing a PET CT scan.  He will need chemotherapy.  At this time he is still receiving intravenous Zosyn 3 times daily at home for another 1 week.  I have also recommended doing a foundation 1 testing and MSI testing. -We will also obtain a CEA level today.  2.  Neuropathy: -he does not report any neuropathy at  this time.  It has resolved.  3.  Rectal pain: -He is taking OxyContin 20 mg twice daily. -He takes oxycodone 15 mg every 3 hours.  He is also taking ibuprofen twice a day.  4.   Anxiety: -He takes Xanax as needed which is helping.

## 2018-08-06 NOTE — Progress Notes (Signed)
Jonathan Wright,  [id number]   CLINIC:  Medical Oncology/Hematology  PCP:  [name], MD [address] [place] [address] [phone]   REASON FOR VISIT: Follow-up for rectal adenocarcinoma  CURRENT THERAPY: Surgery  BRIEF ONCOLOGIC HISTORY:  Oncology History   Stage IIIB rectal adenocarcinoma, mT2N2M0 Treated with 5Gy x 5 at [facility]     Rectal cancer [facility])   09/20/2015 Procedure    colonoscopy with firm rectal mass and 4 cm polyp in proximal rectum, infiltrating non-obstructing large mass within the distal rectum extending towards anal verge    09/20/2015 Miscellaneous    Microsatellite stable.     09/28/2015 Imaging    CT C/A/P no evidence of distant metastatic disease. irregular thickening of the low rectum and anal canal, several enlarged perirectal LN, indeterminate 55m RUL nodule    09/28/2015 Imaging    MRI pelvis assym diffuse circum lobular thickening of rectum, near full wall thickness extension along lower anterior rectum W/O extra serosal extension c/w known adeno, mult. perirectal LN    12/06/2015 - 12/10/2015 Radiation Therapy    [facility] Dr. AAngelina Ok  2500.00 cGy, 500.00 cGy per day in 5 fractions delivered 1x per day to the 96.0% Isodose line Treatment Dates: 12/06/2015 through 12/10/2015.     01/03/2016 Procedure    PICC placed    01/04/2016 - 03/14/2016 Chemotherapy    FOLFOX x 6 cycles    06/07/2016 Definitive Surgery    Robotic assisted APR with coloanal anastomosis and DLI [facility]   06/19/2016 - 06/23/2016 Hospital Admission    Admit date: 06/19/2016 Admission diagnosis: Sepsis Additional comments: Sepsis due to multiple gram-negative active uremia. Likely source [facility]irectal infection [facility] general surgery following, perirectal abscess draining by itself, white count and temperatures improved. He had a PICC  line from prior to this admission which was line removed on 06/21/2016, vancomycin stopped on 06/22/2016, He tolerated test dose of Rocephin well on 06/22/2016 without any issues, [facility], requested to follow with PCP in 3 days to make sure he is tolerating oral medications well and has continued to defervesce, request PCP to please check final culture and sensitivity results which should be back in 3 days. He will follow with general surgery outpatient as well.    02/15/2017 Imaging    CT CAP- 1. Well-formed presacral abscess is decreased in size but persistent. 2. No evidence of colorectal cancer metastasis in the abdomen pelvis. 3. Loop ileostomy in the RIGHT lower quadrant.    04/05/2017 Imaging    CT CAP-IMPRESSION: 1. Status post loop right-sided ileostomy with sigmoid to anal anastomosis. 2. Decrease in presacral fluid collection, consistent with abscess. New foci of extraluminal gas, suspicious for fistulous communication to the lower sigmoid. 3. No evidence of metastatic disease.    04/20/2017 Imaging    MRI pelvis w/ and w/o contrast IMPRESSION: 1. No appreciable change in chronic complex presacral space abscess, which demonstrates four separate sites of fistulous communication to the sigmoid colon and colo-anal anastomosis, as detailed. No discrete mass or other findings to suggest local tumor recurrence. 2. No evidence of metastatic disease in the pelvis.      CANCER STAGING: Cancer Staging Rectal cancer ([facility] Staging form: Colon and Rectum, AJCC 7th Edition - Clinical stage from 01/03/2016: Stage IIIB (  T2, N2a, M0) - Signed by Baird Cancer, PA-C on 01/03/2016    INTERVAL HISTORY:  Jonathan Wright 52 y.o. male returns for routine follow-up for rectal adenocarcinoma. He is here today after having his surgery. The stitch to the drain broke on Friday and the drain was loose so his partner took the  drain out Saturday. He does have a little serous drainage on his bandage but it is minimal. He is still having constant pain and pressure. He was under the impression that the pain would be decreased by now but it is still the same. He denies any bleeding or easy bruising. Denies any nausea or vomiting. Denies any fevers or recent infections. He reports his appetite at 100% and his energy level at 50%.     REVIEW OF SYSTEMS:  Review of Systems  Gastrointestinal: Positive for rectal pain.  All other systems reviewed and are negative.    PAST MEDICAL/SURGICAL HISTORY:  Past Medical History:  Diagnosis Date  . Anxiety   . Depression   . Rectal cancer ([place]) 12/17/2015   Past Surgical History:  Procedure Laterality Date  . ABDOMINAL PERINEAL BOWEL RESECTION  06/07/2016   [facility]  . COLON SURGERY    . DIVERTING ILEOSTOMY  06/07/2016   [facility]  . LAPAROSCOPIC LOW ANTERIOR RESCECTION WITH COLOANAL ANASTOMOSIS  06/07/2016   [facility]  . PERIPHERALLY INSERTED CENTRAL CATHETER INSERTION  11/2015     SOCIAL HISTORY:  Social History   Socioeconomic History  . Marital status: Divorced    Spouse name: Not on file  . Number of children: Not on file  . Years of education: Not on file  . Highest education level: Not on file  Occupational History  . Not on file  Social Needs  . Financial resource strain: Not on file  . Food insecurity:    Worry: Not on file    Inability: Not on file  . Transportation needs:    Medical: Not on file    Non-medical: Not on file  Tobacco Use  . Smoking status: Never Smoker  . Smokeless tobacco: Never Used  Substance and Sexual Activity  . Alcohol use: Yes    Comment: occ.   . Drug use: No  . Sexual activity: Not on file  Lifestyle  . Physical activity:    Days per week: Not on file    Minutes per session: Not on file  . Stress: Not on file  Relationships  . Social connections:    Talks on phone: Not on file    Gets together:  Not on file    Attends religious service: Not on file    Active member of club or organization: Not on file    Attends meetings of clubs or organizations: Not on file    Relationship status: Not on file  . Intimate partner violence:    Fear of current or ex partner: Not on file    Emotionally abused: Not on file    Physically abused: Not on file    Forced sexual activity: Not on file  Other Topics Concern  . Not on file  Social History Narrative  . Not on file    FAMILY HISTORY:  Family History  Problem Relation Age of Onset  . Rectal cancer Neg Hx     CURRENT MEDICATIONS:  Outpatient Encounter Medications as of 08/06/2018  Medication Sig  . oxyCODONE (OXYCONTIN) 20 mg 12 hr tablet Take 1 tablet (20 mg total) by mouth  every 12 (twelve) hours.  . Oxycodone HCl 10 MG TABS Take 1.5 tablets (15 mg total) by mouth every 3 (three) hours as needed.  . piperacillin-tazobactam (ZOSYN) IVPB Inject into the vein every 8 (eight) hours.  . [DISCONTINUED] ALPRAZolam (XANAX) 1 MG tablet Take 0.5 tablets (0.5 mg total) by mouth 2 (two) times daily as needed for anxiety.  . ondansetron (ZOFRAN) 8 MG tablet Take 0.5 tablets (4 mg total) by mouth every 8 (eight) hours as needed for nausea or vomiting. (Patient not taking: Reported on 04/16/2018)  . [DISCONTINUED] ciprofloxacin (CIPRO) 500 MG tablet Take 1 tablet (500 mg total) by mouth 2 (two) times daily. (Patient not taking: Reported on 04/16/2018)  . [DISCONTINUED] nitrofurantoin, macrocrystal-monohydrate, (MACROBID) 100 MG capsule Take 1 capsule (100 mg total) by mouth daily. (Patient not taking: Reported on 04/16/2018)  . [DISCONTINUED] sulfamethoxazole-trimethoprim (BACTRIM DS,SEPTRA DS) 800-160 MG tablet Take 1 tablet by mouth daily.   Facility-Administered Encounter Medications as of 08/06/2018  Medication  . sodium chloride flush (NS) 0.9 % injection 10 mL    ALLERGIES:  Allergies  Allergen Reactions  . Penicillins Hives    Has patient  had a PCN reaction causing immediate rash, facial/tongue/throat swelling, SOB or lightheadedness with hypotension: Yes Has patient had a PCN reaction causing severe rash involving mucus membranes or skin necrosis: No Has patient had a PCN reaction that required hospitalization No Has patient had a PCN reaction occurring within the last 10 years: No If all of the above answers are "NO", then may proceed with Cephalosporin use.   Marland Kitchen Penicillin G Other (See Comments)     PHYSICAL EXAM:  ECOG Performance status: 1  Vitals:   08/06/18 0921  BP: 97/68  Pulse: 97  Resp: 18  Temp: 98.5 F (36.9 C)  SpO2: 99%   Filed Weights   08/06/18 0921  Weight: 155 lb 1.6 oz (70.4 kg)    Physical Exam Constitutional:      Appearance: Normal appearance. He is normal weight.  Musculoskeletal: Normal range of motion.  Skin:    General: Skin is warm and dry.  Neurological:     Mental Status: He is alert and oriented to person, place, and time. Mental status is at baseline.  Psychiatric:        Mood and Affect: Mood normal.        Behavior: Behavior normal.        Thought Content: Thought content normal.        Judgment: Judgment normal.      LABORATORY DATA:  I have reviewed the labs as listed.  CBC    Component Value Date/Time   WBC 5.6 08/06/2018 1043   RBC 4.04 (L) 08/06/2018 1043   HGB 11.2 (L) 08/06/2018 1043   HCT 37.2 (L) 08/06/2018 1043   PLT 305 08/06/2018 1043   MCV 92.1 08/06/2018 1043   MCH 27.7 08/06/2018 1043   MCHC 30.1 08/06/2018 1043   RDW 15.8 (H) 08/06/2018 1043   LYMPHSABS 0.8 08/06/2018 1043   MONOABS 0.8 08/06/2018 1043   EOSABS 0.4 08/06/2018 1043   BASOSABS 0.1 08/06/2018 1043   CMP Latest Ref Rng & Units 08/06/2018 05/20/2018 12/25/2017  Glucose 70 - 99 mg/dL 134(H) 153(H) 101(H)  BUN 6 - 20 mg/dL 18 20 13   Creatinine 0.61 - 1.24 mg/dL 1.08 1.72(H) 0.96  Sodium 135 - 145 mmol/L 137 133(L) 139  Potassium 3.5 - 5.1 mmol/L 4.0 4.1 3.9  Chloride 98 - 111  mmol/L 108 103  105  CO2 22 - 32 mmol/L 21(L) 22 25  Calcium 8.9 - 10.3 mg/dL 9.0 9.0 9.5  Total Protein 6.5 - 8.1 g/dL 7.2 8.3(H) 7.8  Total Bilirubin 0.3 - 1.2 mg/dL 0.1(L) 0.4 0.7  Alkaline Phos 38 - 126 U/L 93 149(H) 72  AST 15 - 41 U/L 19 17 15   ALT 0 - 44 U/L 18 21 11(L)       DIAGNOSTIC IMAGING:  I have independently reviewed the scans and discussed with the patient.   I have reviewed Francene Finders, NP's note and agree with the documentation.  I personally performed a face-to-face visit, made revisions and my assessment and plan is as follows.    ASSESSMENT & PLAN:   Rectal cancer ([place]) 1.  Locally advanced rectal cancer: - Stage IIIb (T2N2) rectal adenocarcinoma, treated with radiation 5 Gy over 5 fractions at [facility], FOLFOX 6 cycles from 01/04/2016 through 03/14/2016, status post robotic APR with coloanal anastomosis and diverting loop ileostomy on 06/07/2016. - Post anastomotic dehiscence in October 2017 with pelvic abscess - Rectal biopsy on 03/19/2017 with invasive moderately differentiated adenocarcinoma, rebiopsy in October 2018 with local recurrence at anastomosis associated with an nonhealing pelvic abscess - Chemoradiation therapy with Xeloda from 07/09/2017 through 08/01/2017, 30.6 Gray in 17 fractions at [place] on 12/11/2017 showing decrease in presacral fluid collection, second opinion reading at [facility] concerning for recurrence of tumor invading into the left seminal vesicle - I have reviewed the results of the PET CT scan dated 01/04/2018.  It showed intense uptake in the presacral collection area.  There were 2 fistulous tracts identified.  No evidence of distant metastatic disease. - I have also talked to Janith Lima, NP for Dr. Rush Landmark, medical oncologist at [facility].  His team of doctors there are recommending 3 months of chemotherapy followed by surgery.  Given his peripheral neuropathy, I think he  will do well with FOLFIRI.  Patient is very concerned about the extensive radical surgery that was proposed to him following chemotherapy.  He want to seek a second opinion by different surgeon. - He was seen by Dr. Percell Boston at [facility] clinic.  An MRI was repeated.  A CT-guided biopsy with CT-guided drain placement of the pelvic fluid collection was done on 03/22/2018.  The drain initially put out about 50 cc/day.  Right now it is putting out 25 cc/day.  His rectal discharge has stopped after the drain was placed.  The biopsy was consistent with adenocarcinoma. - He was then referred to Dr. Suanne Marker and was presented at their local tumor board at [facility].  Pelvic exenteration with cystectomy, prostatectomy, resection of rectum/anus/anastomosis, the pelvic inflammation, pelvic sidewall with IORT, urinary reconstruction with ileal conduit and end colostomy (possible ileostomy takedown) and flap reconstruction.  He also cautioned that with chronic leak, he may not proceed with chemotherapy. -His drain is putting out about 10 to 20 cc/day of pus colored material. - I have reviewed the results of the CT scan of the chest on 05/29/2018 which did not show any metastatic disease.  We reviewed the MRI of the pelvis results which showed complex presacral fluid collection, relatively diffuse pelvic soft tissue thickening and hyperenhancement.  There is interval development of lower sacral and coccygeal signal abnormality and heterogeneous hyperenhancement suspicious for osseous tumor involvement.  There is new left hydronephrosis.  MRI of the abdomen did not show any evidence of metastatic disease. - He underwent  pelvic exenteration surgery on 06/06/2018.  He has a positive radial margin. -His drain fell off on Saturday.  He does have sacral pain. -I reviewed the results of the CT abdomen and pelvis dated 08/01/2018 which showed decrease in size and complexity of the presacral fluid collection.  No  evidence of abdominal metastatic disease.  Marrow heterogeneity and cortical destruction involving the coccyx. - I have recommended doing a PET CT scan.  He will need chemotherapy.  At this time he is still receiving intravenous Zosyn 3 times daily at home for another 1 week.  I have also recommended doing a foundation 1 testing and MSI testing. -We will also obtain a CEA level today.  2.  Neuropathy: -he does not report any neuropathy at this time.  It has resolved.  3.  Rectal pain: -He is taking OxyContin 20 mg twice daily. -He takes oxycodone 15 mg every 3 hours.  He is also taking ibuprofen twice a day.  4.  Anxiety: -He takes Xanax as needed which is helping.  Total time spent is 40 minutes with more than 50% of the time spent face-to-face discussing scan results, and further plan of action and coordination of care.    Orders placed this encounter:  Orders Placed This Encounter  Procedures  . NM PET Image Restag (PS) Skull Base To Thigh  . CBC with Differential/Platelet  . Comprehensive metabolic panel  . CEA      Derek Jack, MD [place] [phone]

## 2018-08-06 NOTE — Patient Instructions (Signed)
[facility] Discharge Instructions     Thank you for choosing [facility] at [facility] to provide your oncology and hematology care.  To afford each patient quality time with our provider, please arrive at least 15 minutes before your scheduled appointment time.   If you have a lab appointment with the Cancer Center please come in thru the  Main Entrance and check in at the main information desk  You need to re-schedule your appointment should you arrive 10 or more minutes late.  We strive to give you quality time with our providers, and arriving late affects you and other patients whose appointments are after yours.  Also, if you no show three or more times for appointments you may be dismissed from the clinic at the providers discretion.     Again, thank you for choosing [facility].  Our hope is that these requests will decrease the amount of time that you wait before being seen by our physicians.       _____________________________________________________________  Should you have questions after your visit to [facility], please contact our office at [phone] between the hours of 8:00 a.m. and 4:30 p.m.  Voicemails left after 4:00 p.m. will not be returned until the following business day.  For prescription refill requests, have your pharmacy contact our office and allow 72 hours.    Cancer Center Support Programs:   > Cancer Support Group  2nd Tuesday of the month 1pm-2pm, Journey Room    

## 2018-08-07 LAB — CEA: CEA1: 4.8 ng/mL — AB (ref 0.0–4.7)

## 2018-08-09 ENCOUNTER — Other Ambulatory Visit (HOSPITAL_COMMUNITY): Payer: Self-pay | Admitting: *Deleted

## 2018-08-09 DIAGNOSIS — C20 Malignant neoplasm of rectum: Secondary | ICD-10-CM

## 2018-08-09 DIAGNOSIS — K6289 Other specified diseases of anus and rectum: Secondary | ICD-10-CM

## 2018-08-09 MED ORDER — OXYCODONE HCL 10 MG PO TABS: 15.0000 mg | ORAL_TABLET | ORAL | 0 refills | Status: DC | PRN

## 2018-08-12 ENCOUNTER — Ambulatory Visit (HOSPITAL_COMMUNITY): Payer: BLUE CROSS/BLUE SHIELD

## 2018-08-20 ENCOUNTER — Other Ambulatory Visit (HOSPITAL_COMMUNITY): Payer: Self-pay | Admitting: *Deleted

## 2018-08-20 DIAGNOSIS — C20 Malignant neoplasm of rectum: Secondary | ICD-10-CM

## 2018-08-20 DIAGNOSIS — K6289 Other specified diseases of anus and rectum: Secondary | ICD-10-CM

## 2018-08-20 MED ORDER — OXYCODONE HCL 10 MG PO TABS: 15.0000 mg | ORAL_TABLET | ORAL | 0 refills | Status: DC | PRN

## 2018-08-26 ENCOUNTER — Ambulatory Visit (HOSPITAL_COMMUNITY): Payer: Self-pay | Admitting: Hematology

## 2018-08-27 ENCOUNTER — Ambulatory Visit (HOSPITAL_COMMUNITY): Payer: Self-pay | Admitting: Hematology

## 2018-08-29 ENCOUNTER — Encounter (HOSPITAL_COMMUNITY): Payer: Self-pay | Admitting: *Deleted

## 2018-08-29 NOTE — Progress Notes (Signed)
Received signed release of records from patient. Faxed to [facility] for release of pathology slides.  Confirmed receipt.

## 2018-08-30 ENCOUNTER — Other Ambulatory Visit (HOSPITAL_COMMUNITY): Payer: Self-pay | Admitting: *Deleted

## 2018-08-30 DIAGNOSIS — C20 Malignant neoplasm of rectum: Secondary | ICD-10-CM

## 2018-08-30 MED ORDER — OXYCODONE HCL ER 20 MG PO T12A: 20.0000 mg | EXTENDED_RELEASE_TABLET | Freq: Two times a day (BID) | ORAL | 0 refills | Status: DC

## 2018-09-05 ENCOUNTER — Encounter (HOSPITAL_COMMUNITY): Payer: Self-pay | Admitting: *Deleted

## 2018-09-05 NOTE — Progress Notes (Signed)
I received notification from pathology that specimens from [facility] have been received and are being reviewed by Dr. Lyndon Code.  I have requested that foundation one and PDL-1 be ordered. C20 Stage IV. I spoke with [place].

## 2018-09-09 ENCOUNTER — Other Ambulatory Visit (HOSPITAL_COMMUNITY): Payer: Self-pay | Admitting: *Deleted

## 2018-09-09 ENCOUNTER — Encounter (HOSPITAL_COMMUNITY)
Admission: RE | Admit: 2018-09-09 | Discharge: 2018-09-09 | Disposition: A | Discharge status: Home / Self Care | Payer: BLUE CROSS/BLUE SHIELD | Source: Ambulatory Visit | Attending: Nurse Practitioner | Admitting: Nurse Practitioner

## 2018-09-09 DIAGNOSIS — C20 Malignant neoplasm of rectum: Secondary | ICD-10-CM

## 2018-09-09 DIAGNOSIS — K6289 Other specified diseases of anus and rectum: Secondary | ICD-10-CM

## 2018-09-09 MED ORDER — FLUDEOXYGLUCOSE F - 18 (FDG) INJECTION
9.9400 | Freq: Once | INTRAVENOUS | Status: AC | PRN
  Administered 2018-09-09: 9.94 via INTRAVENOUS

## 2018-09-10 MED ORDER — OXYCODONE HCL 10 MG PO TABS: 15.0000 mg | ORAL_TABLET | ORAL | 0 refills | Status: DC | PRN

## 2018-09-11 ENCOUNTER — Inpatient Hospital Stay (HOSPITAL_COMMUNITY): Payer: BLUE CROSS/BLUE SHIELD | Attending: Hematology | Admitting: Hematology

## 2018-09-11 ENCOUNTER — Encounter (HOSPITAL_COMMUNITY): Payer: Self-pay | Admitting: Hematology

## 2018-09-11 ENCOUNTER — Other Ambulatory Visit: Payer: Self-pay

## 2018-09-11 DIAGNOSIS — C20 Malignant neoplasm of rectum: Secondary | ICD-10-CM

## 2018-09-11 DIAGNOSIS — F418 Other specified anxiety disorders: Secondary | ICD-10-CM

## 2018-09-11 DIAGNOSIS — F419 Anxiety disorder, unspecified: Secondary | ICD-10-CM | POA: Diagnosis not present

## 2018-09-11 DIAGNOSIS — N1330 Unspecified hydronephrosis: Secondary | ICD-10-CM | POA: Diagnosis not present

## 2018-09-11 DIAGNOSIS — G629 Polyneuropathy, unspecified: Secondary | ICD-10-CM

## 2018-09-11 DIAGNOSIS — Z79899 Other long term (current) drug therapy: Secondary | ICD-10-CM | POA: Diagnosis not present

## 2018-09-11 DIAGNOSIS — K6289 Other specified diseases of anus and rectum: Secondary | ICD-10-CM | POA: Insufficient documentation

## 2018-09-11 MED ORDER — ALPRAZOLAM 1 MG PO TABS: 0.5000 mg | ORAL_TABLET | Freq: Two times a day (BID) | ORAL | 0 refills | Status: DC | PRN

## 2018-09-11 NOTE — Assessment & Plan Note (Signed)
1.  Locally advanced rectal cancer: - Stage IIIb (T2N2) rectal adenocarcinoma, treated with radiation 5 Gy over 5 fractions at [facility], FOLFOX 6 cycles from 01/04/2016 through 03/14/2016, status post robotic APR with coloanal anastomosis and diverting loop ileostomy on 06/07/2016. - Post anastomotic dehiscence in October 2017 with pelvic abscess - Rectal biopsy on 03/19/2017 with invasive moderately differentiated adenocarcinoma, rebiopsy in October 2018 with local recurrence at anastomosis associated with an nonhealing pelvic abscess - Chemoradiation therapy with Xeloda from 07/09/2017 through 08/01/2017, 30.6 Gray in 17 fractions at [place] [facility] on 12/11/2017 showing decrease in presacral fluid collection, second opinion reading at [facility] concerning for recurrence of tumor invading into the left seminal vesicle - I have reviewed the results of the PET CT scan dated 01/04/2018.  It showed intense uptake in the presacral collection area.  There were 2 fistulous tracts identified.  No evidence of distant metastatic disease. - I have also talked to Janith Lima, NP for Dr. Rush Landmark, medical oncologist at [facility].  His team of doctors there are recommending 3 months of chemotherapy followed by surgery.  Given his peripheral neuropathy, I think he will do well with FOLFIRI.  Patient is very concerned about the extensive radical surgery that was proposed to him following chemotherapy.  He want to seek a second opinion by different surgeon. - He was seen by Dr. Percell Boston at [facility] clinic.  An MRI was repeated.  A CT-guided biopsy with CT-guided drain placement of the pelvic fluid collection was done on 03/22/2018.  The drain initially put out about 50 cc/day.  Right now it is putting out 25 cc/day.  His rectal discharge has stopped after the drain was placed.  The biopsy was consistent with adenocarcinoma. - He was then referred to Dr. Suanne Marker and was  presented at their local tumor board at [facility].  Pelvic exenteration with cystectomy, prostatectomy, resection of rectum/anus/anastomosis, the pelvic inflammation, pelvic sidewall with IORT, urinary reconstruction with ileal conduit and end colostomy (possible ileostomy takedown) and flap reconstruction.  He also cautioned that with chronic leak, he may not proceed with chemotherapy. - CT of the chest on 05/29/2018 did not show any metastatic disease.We reviewed the MRI of the pelvis results which showed complex presacral fluid collection, relatively diffuse pelvic soft tissue thickening and hyperenhancement.  There is interval development of lower sacral and coccygeal signal abnormality and heterogeneous hyperenhancement suspicious for osseous tumor involvement.  There is new left hydronephrosis.  MRI of the abdomen did not show any evidence of metastatic disease. - He underwent pelvic exenteration surgery on 06/06/2018.  He has a positive radial margin. -His drain fell off on 08/03/2018. -CT of the abdomen and pelvis on 08/01/2018 showed decrease in size and complexity of the presacral fluid collection with no evidence of abdominal metastatic disease.  Marrow heterogeneity and cortical destruction involving the coccyx. -We discussed the results of the PET CT scan dated 09/10/2018 which was compared to prior PET scan from May 2019.  There is circumferential hypermetabolism in the pelvic floor surrounding the presacral fluid collection, slightly increased in size compared to the CT scan from 08/01/2018.  Pelvic floor/left hemipelvic fluid collection new since surgery is stable since the last CT scan in December.  The activity in the surgical bed may be related to granulation/healing but residual tumor is not excluded.  1.5 cm left groin lymph node is slightly increased in size from December scan.  This was not  mentioned on prior scans.  SUV max was 3.6. -CEA was mildly elevated at 4.8. - I have  recommended surgical evaluation.  He would like to see Dr. Arnoldo Morale locally.  I will talk to Dr. Arnoldo Morale tomorrow. -Patient reports drainage in the perineal region, pinkish clear fluid, nonspecific foul-smelling started around 09/06/2018.  We will seek opinion from Dr. Arnoldo Morale to see if this needs to be drained.  We will also ask him to biopsy the left inguinal lymph node to evaluate for metastatic disease. -We have sent foundation 1 CDX testing, the results of which are pending at this time.  2.  Neuropathy: -he does not report any neuropathy at this time.  It has resolved.  3.  Rectal pain: -He is continuing OxyContin 20 mg twice daily. -He takes oxycodone 15 mg every 3-4 hours as needed.  He is also taking ibuprofen twice daily.   4.  Anxiety: -He takes Xanax as needed which is helping.

## 2018-09-11 NOTE — Progress Notes (Signed)
[place], [place] [id number]   CLINIC:  Medical Oncology/Hematology  PCP:  Default, Provider, MD No address on file None   REASON FOR VISIT: Follow-up for rectal adenocarcinoma   CURRENT THERAPY: Observation.   BRIEF ONCOLOGIC HISTORY:  Oncology History   Stage IIIB rectal adenocarcinoma, mT2N2M0 Treated with 5Gy x 5 at [facility]     Rectal cancer [facility])   09/20/2015 Procedure    colonoscopy with firm rectal mass and 4 cm polyp in proximal rectum, infiltrating non-obstructing large mass within the distal rectum extending towards anal verge    09/20/2015 Miscellaneous    Microsatellite stable.     09/28/2015 Imaging    CT C/A/P no evidence of distant metastatic disease. irregular thickening of the low rectum and anal canal, several enlarged perirectal LN, indeterminate 42m RUL nodule    09/28/2015 Imaging    MRI pelvis assym diffuse circum lobular thickening of rectum, near full wall thickness extension along lower anterior rectum W/O extra serosal extension c/w known adeno, mult. perirectal LN    12/06/2015 - 12/10/2015 Radiation Therapy    [facility] Dr. AAngelina Ok  2500.00 cGy, 500.00 cGy per day in 5 fractions delivered 1x per day to the 96.0% Isodose line Treatment Dates: 12/06/2015 through 12/10/2015.     01/03/2016 Procedure    PICC placed    01/04/2016 - 03/14/2016 Chemotherapy    FOLFOX x 6 cycles    06/07/2016 Definitive Surgery    Robotic assisted APR with coloanal anastomosis [facility]   06/19/2016 - 06/23/2016 Hospital Admission    Admit date: 06/19/2016 Admission diagnosis: Sepsis Additional comments: Sepsis due to multiple gram-negative active uremia. Likely [facility] general surgery following, perirectal abscess draining by itself, white count and temperatures improved. He had a PICC line from prior to this  admission which was line removed on 06/21/2016, vancomycin stopped on 06/22/2016, He tolerated test dose of Rocephin well on 06/22/2016 without any issues, [facility], requested to follow with PCP in 3 days to make sure he is tolerating oral medications well and has continued to defervesce, request PCP to please check final culture and sensitivity results which should be back in 3 days. He will follow with general surgery outpatient as well.    02/15/2017 Imaging    CT CAP- 1. Well-formed presacral abscess is decreased in size but persistent. 2. No evidence of colorectal cancer metastasis in the abdomen pelvis. 3. Loop ileostomy in the RIGHT lower quadrant.    04/05/2017 Imaging    CT CAP-IMPRESSION: 1. Status post loop right-sided ileostomy with sigmoid to anal anastomosis. 2. Decrease in presacral fluid collection, consistent with abscess. New foci of extraluminal gas, suspicious for fistulous communication to the lower sigmoid. 3. No evidence of metastatic disease.    04/20/2017 Imaging    MRI pelvis w/ and w/o contrast IMPRESSION: 1. No appreciable change in chronic complex presacral space abscess, which demonstrates four separate sites of fistulous communication to the sigmoid colon and colo-anal anastomosis, as detailed. No discrete mass or other findings to suggest local tumor recurrence. 2. No evidence of metastatic disease in the pelvis.      CANCER STAGING: Cancer Staging Rectal cancer ([facility] Staging form: Colon and Rectum, AJCC 7th Edition - Clinical stage from 01/03/2016: Stage IIIB (T2, N2a, M0) -  Signed by Baird Cancer, PA-C on 01/03/2016    INTERVAL HISTORY:  Mr. Maske 53 y.o. male returns for routine follow-up for rectal adenocarcinoma. He is here today with his partner. He is having drainage that is serous no odor from his flap. He is requesting a referral to Dr. Arnoldo Morale to re-evaluate the  surgery he just had and possible drain placement. Denies any nausea, vomiting, or diarrhea. Denies any new pains. Had not noticed any recent bleeding such as epistaxis, hematuria or hematochezia. Denies recent chest pain on exertion, shortness of breath on minimal exertion, pre-syncopal episodes, or palpitations. Denies any numbness or tingling in hands or feet. Denies any recent fevers, infections, or recent hospitalizations. Patient reports appetite at 100% and energy level at 25%. His appetite has picked up and he has gained a few pounds.    REVIEW OF SYSTEMS:  Review of Systems  Gastrointestinal: Positive for rectal pain.  All other systems reviewed and are negative.    PAST MEDICAL/SURGICAL HISTORY:  Past Medical History:  Diagnosis Date  . Anxiety   . Depression   . Rectal cancer ([place]) 12/17/2015   Past Surgical History:  Procedure Laterality Date  . ABDOMINAL PERINEAL BOWEL RESECTION  06/07/2016   [facility]  . COLON SURGERY    . DIVERTING ILEOSTOMY  06/07/2016   [facility]  . LAPAROSCOPIC LOW ANTERIOR RESCECTION WITH COLOANAL ANASTOMOSIS  06/07/2016   [facility]  . PERIPHERALLY INSERTED CENTRAL CATHETER INSERTION  11/2015     SOCIAL HISTORY:  Social History   Socioeconomic History  . Marital status: Divorced    Spouse name: Not on file  . Number of children: Not on file  . Years of education: Not on file  . Highest education level: Not on file  Occupational History  . Not on file  Social Needs  . Financial resource strain: Not on file  . Food insecurity:    Worry: Not on file    Inability: Not on file  . Transportation needs:    Medical: Not on file    Non-medical: Not on file  Tobacco Use  . Smoking status: Never Smoker  . Smokeless tobacco: Never Used  Substance and Sexual Activity  . Alcohol use: Yes    Comment: occ.   . Drug use: No  . Sexual activity: Not on file  Lifestyle  . Physical activity:    Days per week: Not on file    Minutes  per session: Not on file  . Stress: Not on file  Relationships  . Social connections:    Talks on phone: Not on file    Gets together: Not on file    Attends religious service: Not on file    Active member of club or organization: Not on file    Attends meetings of clubs or organizations: Not on file    Relationship status: Not on file  . Intimate partner violence:    Fear of current or ex partner: Not on file    Emotionally abused: Not on file    Physically abused: Not on file    Forced sexual activity: Not on file  Other Topics Concern  . Not on file  Social History Narrative  . Not on file    FAMILY HISTORY:  Family History  Problem Relation Age of Onset  . Rectal cancer Neg Hx     CURRENT MEDICATIONS:  Outpatient Encounter Medications as of 09/11/2018  Medication Sig  . ALPRAZolam (XANAX) 1 MG tablet  Take 0.5 tablets (0.5 mg total) by mouth 2 (two) times daily as needed for anxiety.  Marland Kitchen oxyCODONE (OXYCONTIN) 20 mg 12 hr tablet Take 1 tablet (20 mg total) by mouth every 12 (twelve) hours.  . Oxycodone HCl 10 MG TABS Take 1.5 tablets (15 mg total) by mouth every 3 (three) hours as needed.  . [DISCONTINUED] ALPRAZolam (XANAX) 1 MG tablet Take 0.5 tablets (0.5 mg total) by mouth 2 (two) times daily as needed for anxiety.  . ondansetron (ZOFRAN) 8 MG tablet Take 0.5 tablets (4 mg total) by mouth every 8 (eight) hours as needed for nausea or vomiting. (Patient not taking: Reported on 04/16/2018)  . [DISCONTINUED] piperacillin-tazobactam (ZOSYN) IVPB Inject into the vein every 8 (eight) hours.   Facility-Administered Encounter Medications as of 09/11/2018  Medication  . sodium chloride flush (NS) 0.9 % injection 10 mL    ALLERGIES:  Allergies  Allergen Reactions  . Penicillins Hives    Has patient had a PCN reaction causing immediate rash, facial/tongue/throat swelling, SOB or lightheadedness with hypotension: Yes Has patient had a PCN reaction causing severe rash involving  mucus membranes or skin necrosis: No Has patient had a PCN reaction that required hospitalization No Has patient had a PCN reaction occurring within the last 10 years: No If all of the above answers are "NO", then may proceed with Cephalosporin use.   Marland Kitchen Penicillin G Other (See Comments)     PHYSICAL EXAM:  ECOG Performance status: 1  Vitals:   09/11/18 0838  BP: 123/74  Pulse: (!) 105  Resp: 16  Temp: 98.4 F (36.9 C)  SpO2: 100%   Filed Weights   09/11/18 0838  Weight: 155 lb (70.3 kg)    Physical Exam Constitutional:      Appearance: Normal appearance.  Musculoskeletal: Normal range of motion.  Skin:    General: Skin is warm and dry.  Neurological:     Mental Status: He is alert and oriented to person, place, and time. Mental status is at baseline.  Psychiatric:        Mood and Affect: Mood normal.        Behavior: Behavior normal.        Thought Content: Thought content normal.        Judgment: Judgment normal.      LABORATORY DATA:  I have reviewed the labs as listed.  CBC    Component Value Date/Time   WBC 5.6 08/06/2018 1043   RBC 4.04 (L) 08/06/2018 1043   HGB 11.2 (L) 08/06/2018 1043   HCT 37.2 (L) 08/06/2018 1043   PLT 305 08/06/2018 1043   MCV 92.1 08/06/2018 1043   MCH 27.7 08/06/2018 1043   MCHC 30.1 08/06/2018 1043   RDW 15.8 (H) 08/06/2018 1043   LYMPHSABS 0.8 08/06/2018 1043   MONOABS 0.8 08/06/2018 1043   EOSABS 0.4 08/06/2018 1043   BASOSABS 0.1 08/06/2018 1043   CMP Latest Ref Rng & Units 08/06/2018 05/20/2018 12/25/2017  Glucose 70 - 99 mg/dL 134(H) 153(H) 101(H)  BUN 6 - 20 mg/dL _0 Creatinine 0.61 - 1.24 mg/dL 1.08 1.72(H) 0.96  Sodium 135 - 145 mmol/L 137 133(L) 139  Potassium 3.5 - 5.1 mmol/L 4.0 4.1 3.9  Chloride 98 - 111 mmol/L 108 103 105  CO2 22 - 32 mmol/L 21(L) 22 25  Calcium 8.9 - 10.3 mg/dL 9.0 9.0 9.5  Total Protein 6.5 - 8.1 g/dL 7.2 8.3(H) 7.8  Total Bilirubin 0.3 - 1.2 mg/dL 0.1(L) 0.4  0.7  Alkaline Phos  38 - 126 U/L 93 149(H) 72  AST 15 - 41 U/L _0 ALT 0 - 44 U/L 18 21 11(L)       DIAGNOSTIC IMAGING:  I have independently reviewed the scans and discussed with the patient.   I have reviewed Francene Finders, NP's note and agree with the documentation.  I personally performed a face-to-face visit, made revisions and my assessment and plan is as follows.    ASSESSMENT & PLAN:   Rectal cancer ([place]) 1.  Locally advanced rectal cancer: - Stage IIIb (T2N2) rectal adenocarcinoma, treated with radiation 5 Gy over 5 fractions at [facility], FOLFOX 6 cycles from 01/04/2016 through 03/14/2016, status post robotic APR with coloanal anastomosis and diverting loop ileostomy on 06/07/2016. - Post anastomotic dehiscence in October 2017 with pelvic abscess - Rectal biopsy on 03/19/2017 with invasive moderately differentiated adenocarcinoma, rebiopsy in October 2018 with local recurrence at anastomosis associated with an nonhealing pelvic abscess - Chemoradiation therapy with Xeloda from 07/09/2017 through 08/01/2017, 30.6 Gray in 17 fractions at [place] [facility] on 12/11/2017 showing decrease in presacral fluid collection, second opinion reading at [facility] concerning for recurrence of tumor invading into the left seminal vesicle - I have reviewed the results of the PET CT scan dated 01/04/2018.  It showed intense uptake in the presacral collection area.  There were 2 fistulous tracts identified.  No evidence of distant metastatic disease. - I have also talked to Janith Lima, NP for Dr. Rush Landmark, medical oncologist at [facility].  His team of doctors there are recommending 3 months of chemotherapy followed by surgery.  Given his peripheral neuropathy, I think he will do well with FOLFIRI.  Patient is very concerned about the extensive radical surgery that was proposed to him following chemotherapy.  He want to seek a second opinion by different surgeon. - He  was seen by Dr. Percell Boston at [facility] clinic.  An MRI was repeated.  A CT-guided biopsy with CT-guided drain placement of the pelvic fluid collection was done on 03/22/2018.  The drain initially put out about 50 cc/day.  Right now it is putting out 25 cc/day.  His rectal discharge has stopped after the drain was placed.  The biopsy was consistent with adenocarcinoma. - He was then referred to Dr. Suanne Marker and was presented at their local tumor board at [facility].  Pelvic exenteration with cystectomy, prostatectomy, resection of rectum/anus/anastomosis, the pelvic inflammation, pelvic sidewall with IORT, urinary reconstruction with ileal conduit and end colostomy (possible ileostomy takedown) and flap reconstruction.  He also cautioned that with chronic leak, he may not proceed with chemotherapy. - CT of the chest on 05/29/2018 did not show any metastatic disease.We reviewed the MRI of the pelvis results which showed complex presacral fluid collection, relatively diffuse pelvic soft tissue thickening and hyperenhancement.  There is interval development of lower sacral and coccygeal signal abnormality and heterogeneous hyperenhancement suspicious for osseous tumor involvement.  There is new left hydronephrosis.  MRI of the abdomen did not show any evidence of metastatic disease. - He underwent pelvic exenteration surgery on 06/06/2018.  He has a positive radial margin. -His drain fell off on 08/03/2018. -CT of the abdomen and pelvis on 08/01/2018 showed decrease in size and complexity of the presacral fluid collection with no evidence of abdominal metastatic disease.  Marrow heterogeneity and cortical destruction involving the coccyx. -We discussed the results of the PET CT scan  dated 09/10/2018 which was compared to prior PET scan from May 2019.  There is circumferential hypermetabolism in the pelvic floor surrounding the presacral fluid collection, slightly increased in size compared to the CT scan  from 08/01/2018.  Pelvic floor/left hemipelvic fluid collection new since surgery is stable since the last CT scan in December.  The activity in the surgical bed may be related to granulation/healing but residual tumor is not excluded.  1.5 cm left groin lymph node is slightly increased in size from December scan.  This was not mentioned on prior scans.  SUV max was 3.6. -CEA was mildly elevated at 4.8. - I have recommended surgical evaluation.  He would like to see Dr. Arnoldo Morale locally.  I will talk to Dr. Arnoldo Morale tomorrow. -Patient reports drainage in the perineal region, pinkish clear fluid, nonspecific foul-smelling started around 09/06/2018.  We will seek opinion from Dr. Arnoldo Morale to see if this needs to be drained.  We will also ask him to biopsy the left inguinal lymph node to evaluate for metastatic disease. -We have sent foundation 1 CDX testing, the results of which are pending at this time.  2.  Neuropathy: -he does not report any neuropathy at this time.  It has resolved.  3.  Rectal pain: -He is continuing OxyContin 20 mg twice daily. -He takes oxycodone 15 mg every 3-4 hours as needed.  He is also taking ibuprofen twice daily.   4.  Anxiety: -He takes Xanax as needed which is helping.      Orders placed this encounter:  No orders of the defined types were placed in this encounter.     Derek Jack, MD [place] [phone]

## 2018-09-12 ENCOUNTER — Encounter: Payer: Self-pay | Admitting: General Surgery

## 2018-09-12 ENCOUNTER — Ambulatory Visit: Payer: BLUE CROSS/BLUE SHIELD | Admitting: General Surgery

## 2018-09-12 VITALS — BP 122/76 | HR 110 | Temp 98.7°F | Resp 18 | Wt 153.2 lb

## 2018-09-12 DIAGNOSIS — C20 Malignant neoplasm of rectum: Secondary | ICD-10-CM | POA: Diagnosis not present

## 2018-09-12 NOTE — Patient Instructions (Signed)
Open Lymph Node Biopsy An open lymph node biopsy is a procedure to remove a lymph node so that it can be examined under a microscope. Lymph nodes are part of the body's disease-fighting system (immune system). The immune system protects the body from infections, germs, and diseases. An open lymph node biopsy may be done to:  Look for germs or cancer cells in your lymph node.  Find out why your lymph node is swollen.  Find out more about a condition you have. Lymph nodes are found in many locations in the body. Biopsies are often done on lymph nodes in the head, neck, armpit, or groin. Tell a health care provider about:  Any allergies you have.  All medicines you are taking, including vitamins, herbs, eye drops, creams, and over-the-counter medicines.  Any problems you or family members have had with anesthetic medicines.  Any blood disorders you have.  Any surgeries you have had.  Any medical conditions you have or have had.  Whether you are pregnant or may be pregnant. What are the risks? Generally, this is a safe procedure. However, problems may occur, including:  Infection.  Bleeding.  Allergic reactions to medicines.  Damage to other structures or organs, such as a nerve.  Scarring. What happens before the procedure? Medicines Ask your health care provider about:  Changing or stopping your regular medicines. This is especially important if you are taking diabetes medicines or blood thinners.  Taking medicines such as aspirin and ibuprofen. These medicines can thin your blood. Do not take these medicines unless your health care provider tells you to take them.  Taking over-the-counter medicines, vitamins, herbs, and supplements. General instructions  Follow instructions from your health care provider about eating or drinking restrictions.  You may have an exam or testing.  You may have a blood or urine sample taken.  Plan to have someone take you home from the  hospital or clinic.  If you will be going home right after the procedure, plan to have someone with you for 24 hours.  Ask your health care provider how your surgical site will be marked or identified.  Ask your health care provider what steps will be taken to help prevent infection. These may include: ? Removing hair at the biopsy site. ? Washing skin with a germ-killing soap. ? Taking antibiotic medicine. What happens during the procedure?   An IV will be inserted into one of your veins.  You will be given one or more of the following: ? A medicine to help you relax (sedative). ? A medicine to numb the area (local anesthetic).  An incision will be made in the area where your lymph node is located.  Your lymph node will be removed.  Your incision will be closed with stitches (sutures).  An antibiotic ointment may be applied to your incision.  A bandage (dressing) will be placed over your incision. The procedure may vary among health care providers and hospitals. What happens after the procedure?  Your blood pressure, heart rate, breathing rate, and blood oxygen level will be monitored until you leave the hospital or clinic.  Do not drive for 24 hours if you were given a sedative during your procedure.  It is up to you to get the results of your procedure. Ask your health care provider, or the department that is doing the procedure, when your results will be ready. Summary  An open lymph node biopsy is a procedure to remove a lymph node so that   it can be checked for infections, germs, and disease.  Generally, this is a safe procedure. However, problems may occur, including bleeding, infection, allergic reaction to medicines, and damage to other structures or organs.  Follow your health care provider's instructions before the procedure. These may include changing or stopping some medicines and restricting what you eat and drink.  During the procedure, an incision will be  made in the area of the lymph node, the lymph node will be removed, and the incision will be closed with sutures.  You will be monitored after the procedure. Do not drive for 24 hours if you were given a sedative during your procedure. This information is not intended to replace advice given to you by your health care provider. Make sure you discuss any questions you have with your health care provider. Document Released: 09/03/2015 Document Revised: 03/14/2018 Document Reviewed: 03/14/2018 Elsevier Interactive Patient Education  2019 [employer]  

## 2018-09-13 NOTE — H&P (Signed)
Jonathan Wright; [id number]; October 29, 1965   HPI Patient is a 53 year old white male who was referred to my care by Dr. Delton Coombes for a lymph node biopsy.  Patient has an extensive history of rectal cancer, status post a low anterior resection with colon anal anastomosis with postoperative complications resulting in abdominal perineal resection.  He has been treated at both [facility] and the [place] clinic.  He subsequently underwent a flap transfer to the perineum.  He has been receiving chemotherapy and had a recent PET scan which shows lymphadenopathy.  He also has had chronic fluid collections developing in the pelvis.  More recently he has developed an open wound in the perineum with ongoing drainage.  The wound does drain yellowish fluid.  He gets some abdominal relief when he has excess drainage.  He currently denies any fever or chills.  He has 4 out of 10 abdominal pain. Past Medical History:  Diagnosis Date  . Anxiety   . Depression   . Rectal cancer ([place]) 12/17/2015    Past Surgical History:  Procedure Laterality Date  . ABDOMINAL PERINEAL BOWEL RESECTION  06/07/2016   [facility]  . COLON SURGERY    . DIVERTING ILEOSTOMY  06/07/2016   [facility]  . LAPAROSCOPIC LOW ANTERIOR RESCECTION WITH COLOANAL ANASTOMOSIS  06/07/2016   [facility]  . PERIPHERALLY INSERTED CENTRAL CATHETER INSERTION  11/2015    Family History  Problem Relation Age of Onset  . Rectal cancer Neg Hx     Current Outpatient Medications on File Prior to Visit  Medication Sig Dispense Refill  . ALPRAZolam (XANAX) 1 MG tablet Take 0.5 tablets (0.5 mg total) by mouth 2 (two) times daily as needed for anxiety. 30 tablet 0  . oxyCODONE (OXYCONTIN) 20 mg 12 hr tablet Take 1 tablet (20 mg total) by mouth every 12 (twelve) hours. 60 tablet 0  . Oxycodone HCl 10 MG TABS Take 1.5 tablets (15 mg total) by mouth every 3 (three) hours as needed. 180 tablet 0   Current Facility-Administered Medications on File  Prior to Visit  Medication Dose Route Frequency Provider Last Rate Last Dose  . sodium chloride flush (NS) 0.9 % injection 10 mL  10 mL Intracatheter PRN Penland, Kelby Fam, MD        No Active Allergies  Social History   Substance and Sexual Activity  Alcohol Use Yes   Comment: occ.     Social History   Tobacco Use  Smoking Status Never Smoker  Smokeless Tobacco Never Used    Review of Systems  Constitutional: Negative.   HENT: Negative.   Eyes: Negative.   Respiratory: Negative.   Cardiovascular: Negative.   Gastrointestinal: Negative.   Genitourinary: Negative.   Musculoskeletal: Positive for back pain.  Skin: Negative.   Neurological: Negative.   Endo/Heme/Allergies: Negative.   Psychiatric/Behavioral: Negative.     Objective   Vitals:   09/12/18 1001  BP: 122/76  Pulse: (!) 110  Resp: 18  Temp: 98.7 F (37.1 C)    Physical Exam Vitals signs reviewed.  Constitutional:      Appearance: Normal appearance. He is not ill-appearing.  HENT:     Head: Normocephalic and atraumatic.  Cardiovascular:     Rate and Rhythm: Normal rate and regular rhythm.     Heart sounds: Normal heart sounds. No murmur. No friction rub. No gallop.   Pulmonary:     Effort: Pulmonary effort is normal. No respiratory distress.     Breath sounds: Normal breath  sounds. No stridor. No wheezing, rhonchi or rales.  Abdominal:     General: Abdomen is flat. There is no distension.     Palpations: Abdomen is soft. There is no mass.     Tenderness: There is no abdominal tenderness. There is no guarding or rebound.     Hernia: No hernia is present.     Comments: 2 ostomy bags are present. Enlarged left inguinal lymph node  Genitourinary:    Comments: Perineum with open draining wound along the superior aspect of the flap transfer.  Clear yellow drainage noted. Skin:    General: Skin is warm and dry.  Neurological:     General: No focal deficit present.     Mental Status: He is alert  and oriented to person, place, and time.   Dr. 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  The risks and benefits of the procedure were fully explained to the patient, who gave informed consent.  I will discuss with oncology the possible need for a fistulogram to assess the perineal wound.

## 2018-09-13 NOTE — Progress Notes (Signed)
Jonathan Wright; [id number]; 03-30-1966   HPI Patient is a 53 year old white male who was referred to my care by Dr. Delton Coombes for a lymph node biopsy.  Patient has an extensive history of rectal cancer, status post a low anterior resection with colon anal anastomosis with postoperative complications resulting in abdominal perineal resection.  He has been treated at both [facility] and the [place] [facility].  He subsequently underwent a flap transfer to the perineum.  He has been receiving chemotherapy and had a recent PET scan which shows lymphadenopathy.  He also has had chronic fluid collections developing in the pelvis.  More recently he has developed an open wound in the perineum with ongoing drainage.  The wound does drain yellowish fluid.  He gets some abdominal relief when he has excess drainage.  He currently denies any fever or chills.  He has 4 out of 10 abdominal pain. Past Medical History:  Diagnosis Date  . Anxiety   . Depression   . Rectal cancer ([place]) 12/17/2015    Past Surgical History:  Procedure Laterality Date  . ABDOMINAL PERINEAL BOWEL RESECTION  06/07/2016   [facility]  . COLON SURGERY    . DIVERTING ILEOSTOMY  06/07/2016   [facility]  . LAPAROSCOPIC LOW ANTERIOR RESCECTION WITH COLOANAL ANASTOMOSIS  06/07/2016   [facility]  . PERIPHERALLY INSERTED CENTRAL CATHETER INSERTION  11/2015    Family History  Problem Relation Age of Onset  . Rectal cancer Neg Hx     Current Outpatient Medications on File Prior to Visit  Medication Sig Dispense Refill  . ALPRAZolam (XANAX) 1 MG tablet Take 0.5 tablets (0.5 mg total) by mouth 2 (two) times daily as needed for anxiety. 30 tablet 0  . oxyCODONE (OXYCONTIN) 20 mg 12 hr tablet Take 1 tablet (20 mg total) by mouth every 12 (twelve) hours. 60 tablet 0  . Oxycodone HCl 10 MG TABS Take 1.5 tablets (15 mg total) by mouth every 3 (three) hours as needed. 180 tablet 0   Current Facility-Administered Medications on File  Prior to Visit  Medication Dose Route Frequency Provider Last Rate Last Dose  . sodium chloride flush (NS) 0.9 % injection 10 mL  10 mL Intracatheter PRN Penland, Kelby Fam, MD        No Active Allergies  Social History   Substance and Sexual Activity  Alcohol Use Yes   Comment: occ.     Social History   Tobacco Use  Smoking Status Never Smoker  Smokeless Tobacco Never Used    Review of Systems  Constitutional: Negative.   HENT: Negative.   Eyes: Negative.   Respiratory: Negative.   Cardiovascular: Negative.   Gastrointestinal: Negative.   Genitourinary: Negative.   Musculoskeletal: Positive for back pain.  Skin: Negative.   Neurological: Negative.   Endo/Heme/Allergies: Negative.   Psychiatric/Behavioral: Negative.     Objective   Vitals:   09/12/18 1001  BP: 122/76  Pulse: (!) 110  Resp: 18  Temp: 98.7 F (37.1 C)    Physical Exam Vitals signs reviewed.  Constitutional:      Appearance: Normal appearance. He is not ill-appearing.  HENT:     Head: Normocephalic and atraumatic.  Cardiovascular:     Rate and Rhythm: Normal rate and regular rhythm.     Heart sounds: Normal heart sounds. No murmur. No friction rub. No gallop.   Pulmonary:     Effort: Pulmonary effort is normal. No respiratory distress.     Breath sounds: Normal breath  sounds. No stridor. No wheezing, rhonchi or rales.  Abdominal:     General: Abdomen is flat. There is no distension.     Palpations: Abdomen is soft. There is no mass.     Tenderness: There is no abdominal tenderness. There is no guarding or rebound.     Hernia: No hernia is present.     Comments: 2 ostomy bags are present. Enlarged left inguinal lymph node  Genitourinary:    Comments: Perineum with open draining wound along the superior aspect of the flap transfer.  Clear yellow drainage noted. Skin:    General: Skin is warm and dry.  Neurological:     General: No focal deficit present.     Mental Status: He is alert  and oriented to person, place, and time.   Dr. 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  The risks and benefits of the procedure were fully explained to the patient, who gave informed consent.  I will discuss with oncology the possible need for a fistulogram to assess the perineal wound.

## 2018-09-13 NOTE — Patient Instructions (Signed)
Jonathan Wright  09/13/2018     @PREFPERIOPPHARMACY @   Your procedure is scheduled on 09/18/18.  Report to [facility] at 9:30 A.M.  Call this number if you have problems the morning of surgery:  [phone]   Remember:  Do not eat or drink after midnight.      Take these medicines the morning of surgery with A SIP OF WATER Xanax, Oxycontin OR oxycodone    Do not wear jewelry, make-up or nail polish.  Do not wear lotions, powders, or perfumes, or deodorant.  Do not shave 48 hours prior to surgery.  Men may shave face and neck.  Do not bring valuables to the hospital.  [facility] is not responsible for any belongings or valuables.  Contacts, dentures or bridgework may not be worn into surgery.  Leave your suitcase in the car.  After surgery it may be brought to your room.  For patients admitted to the hospital, discharge time will be determined by your treatment team.  Patients discharged the day of surgery will not be allowed to drive home.    Please read over the following fact sheets that you were given. Surgical Site Infection Prevention and Anesthesia Post-op Instructions     PATIENT INSTRUCTIONS POST-ANESTHESIA  IMMEDIATELY FOLLOWING SURGERY:  Do not drive or operate machinery for the first twenty four hours after surgery.  Do not make any important decisions for twenty four hours after surgery or while taking narcotic pain medications or sedatives.  If you develop intractable nausea and vomiting or a severe headache please notify your doctor immediately.  FOLLOW-UP:  Please make an appointment with your surgeon as instructed. You do not need to follow up with anesthesia unless specifically instructed to do so.  WOUND CARE INSTRUCTIONS (if applicable):  Keep a dry clean dressing on the anesthesia/puncture wound site if there is drainage.  Once the wound has quit draining you may leave it open to air.  Generally you should leave the bandage intact for twenty four hours  unless there is drainage.  If the epidural site drains for more than 36-48 hours please call the anesthesia department.  QUESTIONS?:  Please feel free to call your physician or the hospital operator if you have any questions, and they will be happy to assist you.      Lymphadenopathy  Lymphadenopathy means that your lymph glands are swollen or larger than normal (enlarged). Lymph glands, also called lymph nodes, are collections of tissue that filter bacteria, viruses, and waste from your bloodstream. They are part of your body's disease-fighting system (immune system), which protects your body from germs. There may be different causes of lymphadenopathy, depending on where it is in your body. Some types go away on their own. Lymphadenopathy can occur anywhere that you have lymph glands, including these areas:  Neck (cervical lymphadenopathy).  Chest (mediastinal lymphadenopathy).  Lungs (hilar lymphadenopathy).  Underarms (axillary lymphadenopathy).  Groin (inguinal lymphadenopathy). When your immune system responds to germs, infection-fighting cells and fluid build up in your lymph glands. This causes some swelling and enlargement. If the lymph glands do not go back to normal after you have an infection or disease, your health care provider may do tests. These tests help to monitor your condition and find the reason why the glands are still swollen and enlarged. Follow these instructions at home:  Get plenty of rest.  Take over-the-counter and prescription medicines only as told by your health care provider. Your health care provider may recommend  over-the-counter medicines for pain.  If directed, apply heat to swollen lymph glands as often as told by your health care provider. Use the heat source that your health care provider recommends, such as a moist heat pack or a heating pad. ? Place a towel between your skin and the heat source. ? Leave the heat on for 20-30 minutes. ? Remove  the heat if your skin turns bright red. This is especially important if you are unable to feel pain, heat, or cold. You may have a greater risk of getting burned.  Check your affected lymph glands every day for changes. Check other lymph gland areas as told by your health care provider. Check for changes such as: ? More swelling. ? Sudden increase in size. ? Redness or pain. ? Hardness.  Keep all follow-up visits as told by your health care provider. This is important. Contact a health care provider if you have:  Swelling that gets worse or spreads to other areas.  Problems with breathing.  Lymph glands that: ? Are still swollen after 2 weeks. ? Have suddenly gotten bigger. ? Are red, painful, or hard.  A fever or chills.  Fatigue.  A sore throat.  Pain in your abdomen.  Weight loss.  Night sweats. Get help right away if you have:  Fluid leaking from an enlarged lymph gland.  Severe pain.  Chest pain.  Shortness of breath. Summary  Lymphadenopathy means that your lymph glands are swollen or larger than normal (enlarged).  Lymph glands (also called lymph nodes) are collections of tissue that filter bacteria, viruses, and waste from the bloodstream. They are part of your body's disease-fighting system (immune system).  Lymphadenopathy can occur anywhere that you have lymph glands.  If your enlarged and swollen lymph glands do not go back to normal after you have an infection or disease, your health care provider may do tests to monitor your condition and find the reason why the glands are still swollen and enlarged.  Check your affected lymph glands every day for changes. Check other lymph gland areas as told by your health care provider. This information is not intended to replace advice given to you by your health care provider. Make sure you discuss any questions you have with your health care provider. Document Released: 05/16/2008 Document Revised: 06/22/2017  Document Reviewed: 06/22/2017 Elsevier Interactive Patient Education  2019 [employer].

## 2018-09-16 ENCOUNTER — Ambulatory Visit (HOSPITAL_COMMUNITY)
Admission: RE | Admit: 2018-09-16 | Discharge: 2018-09-16 | Disposition: A | Discharge status: Home / Self Care | Payer: BLUE CROSS/BLUE SHIELD | Source: Ambulatory Visit | Attending: General Surgery | Admitting: General Surgery

## 2018-09-16 ENCOUNTER — Other Ambulatory Visit: Payer: Self-pay

## 2018-09-16 ENCOUNTER — Encounter (HOSPITAL_COMMUNITY): Payer: Self-pay

## 2018-09-16 DIAGNOSIS — Z923 Personal history of irradiation: Secondary | ICD-10-CM | POA: Diagnosis not present

## 2018-09-16 DIAGNOSIS — Z9049 Acquired absence of other specified parts of digestive tract: Secondary | ICD-10-CM | POA: Diagnosis not present

## 2018-09-16 DIAGNOSIS — F329 Major depressive disorder, single episode, unspecified: Secondary | ICD-10-CM | POA: Diagnosis not present

## 2018-09-16 DIAGNOSIS — C20 Malignant neoplasm of rectum: Secondary | ICD-10-CM | POA: Diagnosis not present

## 2018-09-16 DIAGNOSIS — Z9221 Personal history of antineoplastic chemotherapy: Secondary | ICD-10-CM | POA: Diagnosis not present

## 2018-09-16 DIAGNOSIS — F419 Anxiety disorder, unspecified: Secondary | ICD-10-CM | POA: Diagnosis not present

## 2018-09-16 DIAGNOSIS — Z79899 Other long term (current) drug therapy: Secondary | ICD-10-CM | POA: Diagnosis not present

## 2018-09-16 DIAGNOSIS — C774 Secondary and unspecified malignant neoplasm of inguinal and lower limb lymph nodes: Secondary | ICD-10-CM | POA: Diagnosis not present

## 2018-09-16 DIAGNOSIS — R590 Localized enlarged lymph nodes: Secondary | ICD-10-CM | POA: Diagnosis present

## 2018-09-18 ENCOUNTER — Other Ambulatory Visit (HOSPITAL_COMMUNITY): Payer: Self-pay | Admitting: Nurse Practitioner

## 2018-09-18 ENCOUNTER — Ambulatory Visit (HOSPITAL_COMMUNITY): Payer: BLUE CROSS/BLUE SHIELD | Admitting: Anesthesiology

## 2018-09-18 ENCOUNTER — Encounter (HOSPITAL_COMMUNITY)
Admission: RE | Disposition: A | Discharge status: Home / Self Care | Payer: Self-pay | Source: Home / Self Care | Attending: General Surgery

## 2018-09-18 ENCOUNTER — Encounter (HOSPITAL_COMMUNITY): Payer: Self-pay | Admitting: Anesthesiology

## 2018-09-18 ENCOUNTER — Ambulatory Visit (HOSPITAL_COMMUNITY)
Admission: RE | Admit: 2018-09-18 | Discharge: 2018-09-18 | Disposition: A | Discharge status: Home / Self Care | Payer: BLUE CROSS/BLUE SHIELD | Attending: General Surgery | Admitting: General Surgery

## 2018-09-18 DIAGNOSIS — C20 Malignant neoplasm of rectum: Secondary | ICD-10-CM

## 2018-09-18 DIAGNOSIS — Z9049 Acquired absence of other specified parts of digestive tract: Secondary | ICD-10-CM | POA: Insufficient documentation

## 2018-09-18 DIAGNOSIS — F419 Anxiety disorder, unspecified: Secondary | ICD-10-CM | POA: Insufficient documentation

## 2018-09-18 DIAGNOSIS — Z79899 Other long term (current) drug therapy: Secondary | ICD-10-CM | POA: Insufficient documentation

## 2018-09-18 DIAGNOSIS — Z923 Personal history of irradiation: Secondary | ICD-10-CM | POA: Insufficient documentation

## 2018-09-18 DIAGNOSIS — F329 Major depressive disorder, single episode, unspecified: Secondary | ICD-10-CM | POA: Insufficient documentation

## 2018-09-18 DIAGNOSIS — Z9221 Personal history of antineoplastic chemotherapy: Secondary | ICD-10-CM | POA: Insufficient documentation

## 2018-09-18 DIAGNOSIS — R590 Localized enlarged lymph nodes: Secondary | ICD-10-CM | POA: Diagnosis not present

## 2018-09-18 DIAGNOSIS — C774 Secondary and unspecified malignant neoplasm of inguinal and lower limb lymph nodes: Secondary | ICD-10-CM | POA: Insufficient documentation

## 2018-09-18 HISTORY — PX: INGUINAL LYMPH NODE BIOPSY: SHX5865

## 2018-09-18 SURGERY — BIOPSY, LYMPH NODE, INGUINAL, OPEN
Anesthesia: Monitor Anesthesia Care | Laterality: Left

## 2018-09-18 MED ORDER — MEPERIDINE HCL 50 MG/ML IJ SOLN: 6.2500 mg | INTRAMUSCULAR | Status: DC | PRN

## 2018-09-18 MED ORDER — ARTIFICIAL TEARS OPHTHALMIC OINT
TOPICAL_OINTMENT | OPHTHALMIC | Status: AC
  Filled 2018-09-18: qty 7

## 2018-09-18 MED ORDER — PROPOFOL 500 MG/50ML IV EMUL
INTRAVENOUS | Status: DC | PRN
  Administered 2018-09-18: 150 ug/kg/min via INTRAVENOUS

## 2018-09-18 MED ORDER — CHLORHEXIDINE GLUCONATE CLOTH 2 % EX PADS: 6.0000 | MEDICATED_PAD | Freq: Once | CUTANEOUS | Status: DC

## 2018-09-18 MED ORDER — SUCCINYLCHOLINE CHLORIDE 200 MG/10ML IV SOSY
PREFILLED_SYRINGE | INTRAVENOUS | Status: AC
  Filled 2018-09-18: qty 20

## 2018-09-18 MED ORDER — GLYCOPYRROLATE PF 0.2 MG/ML IJ SOSY
PREFILLED_SYRINGE | INTRAMUSCULAR | Status: AC
  Filled 2018-09-18: qty 2

## 2018-09-18 MED ORDER — LIDOCAINE 2% (20 MG/ML) 5 ML SYRINGE
INTRAMUSCULAR | Status: AC
  Filled 2018-09-18: qty 15

## 2018-09-18 MED ORDER — PROMETHAZINE HCL 25 MG/ML IJ SOLN: 6.2500 mg | INTRAMUSCULAR | Status: DC | PRN

## 2018-09-18 MED ORDER — HYDROCODONE-ACETAMINOPHEN 7.5-325 MG PO TABS: 1.0000 | ORAL_TABLET | Freq: Once | ORAL | Status: DC | PRN

## 2018-09-18 MED ORDER — CEFAZOLIN SODIUM-DEXTROSE 2-4 GM/100ML-% IV SOLN
INTRAVENOUS | Status: AC
  Filled 2018-09-18: qty 100

## 2018-09-18 MED ORDER — LACTATED RINGERS IV SOLN: INTRAVENOUS | Status: DC

## 2018-09-18 MED ORDER — CEFAZOLIN SODIUM-DEXTROSE 2-4 GM/100ML-% IV SOLN
2.0000 g | INTRAVENOUS | Status: AC
  Administered 2018-09-18: 2 g via INTRAVENOUS

## 2018-09-18 MED ORDER — FENTANYL CITRATE (PF) 250 MCG/5ML IJ SOLN
INTRAMUSCULAR | Status: AC
  Filled 2018-09-18: qty 5

## 2018-09-18 MED ORDER — LIDOCAINE 1 % OPTIME INJ - NO CHARGE
INTRAMUSCULAR | Status: DC | PRN
  Administered 2018-09-18: 12 mL

## 2018-09-18 MED ORDER — LACTATED RINGERS IV SOLN
INTRAVENOUS | Status: DC
  Administered 2018-09-18: 10:00:00 via INTRAVENOUS

## 2018-09-18 MED ORDER — KETOROLAC TROMETHAMINE 30 MG/ML IJ SOLN
30.0000 mg | Freq: Once | INTRAMUSCULAR | Status: AC
  Administered 2018-09-18: 30 mg via INTRAVENOUS
  Filled 2018-09-18: qty 1

## 2018-09-18 MED ORDER — PROPOFOL 10 MG/ML IV BOLUS
INTRAVENOUS | Status: AC
  Filled 2018-09-18: qty 40

## 2018-09-18 MED ORDER — MIDAZOLAM HCL 5 MG/5ML IJ SOLN
INTRAMUSCULAR | Status: DC | PRN
  Administered 2018-09-18: 2 mg via INTRAVENOUS

## 2018-09-18 MED ORDER — ONDANSETRON HCL 4 MG/2ML IJ SOLN
INTRAMUSCULAR | Status: DC | PRN
  Administered 2018-09-18: 4 mg via INTRAVENOUS

## 2018-09-18 MED ORDER — LABETALOL HCL 5 MG/ML IV SOLN
INTRAVENOUS | Status: AC
  Filled 2018-09-18: qty 4

## 2018-09-18 MED ORDER — 0.9 % SODIUM CHLORIDE (POUR BTL) OPTIME
TOPICAL | Status: DC | PRN
  Administered 2018-09-18: 1000 mL

## 2018-09-18 MED ORDER — FENTANYL CITRATE (PF) 100 MCG/2ML IJ SOLN
INTRAMUSCULAR | Status: DC | PRN
  Administered 2018-09-18 (×2): 50 ug via INTRAVENOUS

## 2018-09-18 MED ORDER — LIDOCAINE HCL (PF) 1 % IJ SOLN
INTRAMUSCULAR | Status: AC
  Filled 2018-09-18: qty 30

## 2018-09-18 MED ORDER — MIDAZOLAM HCL 2 MG/2ML IJ SOLN
INTRAMUSCULAR | Status: AC
  Filled 2018-09-18: qty 2

## 2018-09-18 MED ORDER — ONDANSETRON HCL 4 MG/2ML IJ SOLN
INTRAMUSCULAR | Status: AC
  Filled 2018-09-18: qty 2

## 2018-09-18 MED ORDER — DEXAMETHASONE SODIUM PHOSPHATE 10 MG/ML IJ SOLN
INTRAMUSCULAR | Status: AC
  Filled 2018-09-18: qty 1

## 2018-09-18 MED ORDER — PROPOFOL 10 MG/ML IV BOLUS
INTRAVENOUS | Status: AC
  Filled 2018-09-18: qty 20

## 2018-09-18 MED ORDER — HYDROMORPHONE HCL 1 MG/ML IJ SOLN: 0.2500 mg | INTRAMUSCULAR | Status: DC | PRN

## 2018-09-18 SURGICAL SUPPLY — 28 items
BRUSH SCRUB SURG 4.25 DISP (MISCELLANEOUS) ×1 IMPLANT
CLOTH BEACON ORANGE TIMEOUT ST (SAFETY) ×2 IMPLANT
COVER LIGHT HANDLE STERIS (MISCELLANEOUS) ×4 IMPLANT
COVER WAND RF STERILE (DRAPES) ×1 IMPLANT
DECANTER SPIKE VIAL GLASS SM (MISCELLANEOUS) ×2 IMPLANT
DERMABOND ADVANCED (GAUZE/BANDAGES/DRESSINGS) ×1
DERMABOND ADVANCED .7 DNX12 (GAUZE/BANDAGES/DRESSINGS) IMPLANT
ELECT CAUTERY BLADE 6.4 (BLADE) ×1 IMPLANT
ELECT REM PT RETURN 9FT ADLT (ELECTROSURGICAL) ×2
ELECTRODE REM PT RTRN 9FT ADLT (ELECTROSURGICAL) ×1 IMPLANT
GLOVE BIOGEL PI IND STRL 7.0 (GLOVE) ×1 IMPLANT
GLOVE BIOGEL PI INDICATOR 7.0 (GLOVE) ×1
GLOVE SURG SS PI 7.5 STRL IVOR (GLOVE) ×2 IMPLANT
GOWN STRL REUS W/TWL LRG LVL3 (GOWN DISPOSABLE) ×4 IMPLANT
KIT TURNOVER KIT A (KITS) ×2 IMPLANT
MANIFOLD NEPTUNE II (INSTRUMENTS) ×2 IMPLANT
NDL HYPO 25X1 1.5 SAFETY (NEEDLE) ×1 IMPLANT
NEEDLE HYPO 25X1 1.5 SAFETY (NEEDLE) ×2 IMPLANT
NS IRRIG 1000ML POUR BTL (IV SOLUTION) ×2 IMPLANT
PACK MINOR (CUSTOM PROCEDURE TRAY) ×2 IMPLANT
PAD ARMBOARD 7.5X6 YLW CONV (MISCELLANEOUS) ×2 IMPLANT
SET BASIN LINEN APH (SET/KITS/TRAYS/PACK) ×2 IMPLANT
SPONGE INTESTINAL PEANUT (DISPOSABLE) ×1 IMPLANT
SUT MNCRL AB 4-0 PS2 18 (SUTURE) ×2 IMPLANT
SUT VIC AB 3-0 SH 27 (SUTURE) ×1
SUT VIC AB 3-0 SH 27X BRD (SUTURE) ×1 IMPLANT
SUT VICRYL AB 3 0 TIES (SUTURE) ×2 IMPLANT
SYR CONTROL 10ML LL (SYRINGE) ×2 IMPLANT

## 2018-09-18 NOTE — Interval H&P Note (Signed)
History and Physical Interval Note:  09/18/2018 10:16 AM  Jonathan Wright  has presented today for surgery, with the diagnosis of rectal cancer  The various methods of treatment have been discussed with the patient and family. After consideration of risks, benefits and other options for treatment, the patient has consented to  Procedure(s): INGUINAL LYMPH NODE BIOPSY (Left) as a surgical intervention .  The patient's history has been reviewed, patient examined, no change in status, stable for surgery.  I have reviewed the patient's chart and labs.  Questions were answered to the patient's satisfaction.     Aviva Signs

## 2018-09-18 NOTE — Op Note (Signed)
Patient:  Jonathan Wright  DOB:  09/01/1965  MRN:  [id number]   Preop Diagnosis: Inguinal lymphadenopathy, history of rectal cancer  Postop Diagnosis: Same  Procedure: Left inguinal lymph node biopsy  Surgeon: Aviva Signs, MD  Anes: MAC  Indications: Patient is a 53 year old white male with a history of rectal cancer who now presents with enlargement of a left inguinal lymph node.  Patient has been referred for a biopsy to rule out metastatic disease.  The risks and benefits of the procedure including bleeding and infection were fully explained to the patient, who gave informed consent.  Procedure note: The patient was placed in supine position.  After monitored anesthesia care was given, the left groin region was prepped and draped using usual sterile technique with Betadine.  Surgical site confirmation was performed.  1% Xylocaine was used for local anesthesia.  An incision was made over the easily palpable left inguinal lymph node.  The dissection was taken down to the lymph node.  The lymph node was excised without difficulty.  It was sent to pathology for further examination.  A bleeding was controlled using Bovie electrocautery.  Subcutaneous layer was reapproximated using a 3-0 Vicryl interrupted suture.  The skin was closed using a 4-0 Monocryl subcuticular suture.  Dermabond was applied.  All tape and needle counts were correct at the end of the procedure.  Patient was awakened and transferred to PACU in stable condition.  Complications: None  EBL: Minimal  Specimen: Left inguinal lymph node

## 2018-09-18 NOTE — Anesthesia Postprocedure Evaluation (Signed)
Anesthesia Post Note  Patient: Jonathan Wright  Procedure(s) Performed: LEFT INGUINAL LYMPH NODE BIOPSY (Left )  Patient location during evaluation: PACU Anesthesia Type: MAC Level of consciousness: awake and alert and oriented Pain management: pain level controlled Vital Signs Assessment: post-procedure vital signs reviewed and stable Respiratory status: spontaneous breathing Cardiovascular status: stable Postop Assessment: no apparent nausea or vomiting Anesthetic complications: no     Last Vitals:  Vitals:   09/18/18 1034 09/18/18 1130  BP: 108/71 94/66  Pulse: 84 86  Resp: 18 17  Temp:  36.8 C  SpO2: 98% 100%    Last Pain:  Vitals:   09/18/18 1130  TempSrc:   PainSc: 0-No pain                 ADAMS, AMY A

## 2018-09-18 NOTE — Anesthesia Preprocedure Evaluation (Signed)
Anesthesia Evaluation    Airway Mallampati: I   Neck ROM: full    Dental  (+) Teeth Intact   Pulmonary    breath sounds clear to auscultation       Cardiovascular  Rhythm:regular     Neuro/Psych Anxiety Depression    GI/Hepatic   Endo/Other    Renal/GU      Musculoskeletal   Abdominal   Peds  Hematology  (+) Blood dyscrasia, anemia ,   Anesthesia Other Findings recral CA hx  Sx, Chemo, Rad TX  Reproductive/Obstetrics                             Anesthesia Physical Anesthesia Plan  ASA: III  Anesthesia Plan: MAC   Post-op Pain Management:    Induction:   PONV Risk Score and Plan:   Airway Management Planned:   Additional Equipment:   Intra-op Plan:   Post-operative Plan:   Informed Consent: I have reviewed the patients History and Physical, chart, labs and discussed the procedure including the risks, benefits and alternatives for the proposed anesthesia with the patient or authorized representative who has indicated his/her understanding and acceptance.       Plan Discussed with: Anesthesiologist  Anesthesia Plan Comments:         Anesthesia Quick Evaluation

## 2018-09-18 NOTE — Transfer of Care (Signed)
Immediate Anesthesia Transfer of Care Note  Patient: Jonathan Wright  Procedure(s) Performed: LEFT INGUINAL LYMPH NODE BIOPSY (Left )  Patient Location: PACU  Anesthesia Type:MAC  Level of Consciousness: awake, alert , oriented and patient cooperative  Airway & Oxygen Therapy: Patient Spontanous Breathing  Post-op Assessment: Report given to RN and Post -op Vital signs reviewed and stable  Post vital signs: Reviewed and stable  Last Vitals:  Vitals Value Taken Time  BP 94/66 09/18/2018 11:30 AM  Temp 36.8 C 09/18/2018 11:30 AM  Pulse 77 09/18/2018 11:33 AM  Resp 14 09/18/2018 11:33 AM  SpO2 100 % 09/18/2018 11:33 AM  Vitals shown include unvalidated device data.  Last Pain:  Vitals:   09/18/18 1130  TempSrc:   PainSc: 0-No pain      Patients Stated Pain Goal: 5 (29/52/84 1324)  Complications: No apparent anesthesia complications

## 2018-09-19 ENCOUNTER — Encounter (HOSPITAL_COMMUNITY): Payer: Self-pay | Admitting: General Surgery

## 2018-09-19 LAB — URINE CULTURE

## 2018-09-23 ENCOUNTER — Other Ambulatory Visit (HOSPITAL_COMMUNITY): Payer: Self-pay | Admitting: *Deleted

## 2018-09-23 DIAGNOSIS — C20 Malignant neoplasm of rectum: Secondary | ICD-10-CM

## 2018-09-23 DIAGNOSIS — K6289 Other specified diseases of anus and rectum: Secondary | ICD-10-CM

## 2018-09-23 NOTE — Telephone Encounter (Signed)
Patient's significant other called clinic today stating that patient's pain had significantly increased over the past few days. He is feeling more pressure in the sacral area. The fluid draining from self-created fistula is picking up in drainage, he is changing his pad 5 times daily.  He states that patient's pain has increased with him standing or sitting for long periods of time. He has been laying in the bed more and has been sleeping a lot.  He has started taking 20 mg of his oxycodone every 3 hours with 2 ibuprofen every 3 hours. This is in addition to oxycontin 20 mg every 12 hours.  I spoke with Dr. Delton Coombes and he said to send prescription for increased long acting pain medication so that break through pain medication can be used less frequently.  Orders received for 30 mg every 12 hours. And stay on the same 15mg  dose of oxycodone every 3 hours PRN.  Orders placed.     Patient also has complained of urine having foul odor.  Patient recently had a urine culture that requested recollection.  Patient was scheduled for lab tomorrow and they are aware of appts.

## 2018-09-24 ENCOUNTER — Other Ambulatory Visit (HOSPITAL_COMMUNITY): Payer: Self-pay | Admitting: *Deleted

## 2018-09-24 ENCOUNTER — Inpatient Hospital Stay (HOSPITAL_COMMUNITY): Payer: BLUE CROSS/BLUE SHIELD | Attending: Hematology

## 2018-09-24 ENCOUNTER — Ambulatory Visit (INDEPENDENT_AMBULATORY_CARE_PROVIDER_SITE_OTHER): Payer: Self-pay | Admitting: General Surgery

## 2018-09-24 ENCOUNTER — Other Ambulatory Visit (HOSPITAL_COMMUNITY): Payer: Self-pay | Admitting: Nurse Practitioner

## 2018-09-24 ENCOUNTER — Encounter: Payer: Self-pay | Admitting: General Surgery

## 2018-09-24 VITALS — BP 131/75 | HR 113 | Temp 97.8°F | Resp 18 | Wt 155.2 lb

## 2018-09-24 DIAGNOSIS — F419 Anxiety disorder, unspecified: Secondary | ICD-10-CM | POA: Insufficient documentation

## 2018-09-24 DIAGNOSIS — Z79899 Other long term (current) drug therapy: Secondary | ICD-10-CM | POA: Insufficient documentation

## 2018-09-24 DIAGNOSIS — Z933 Colostomy status: Secondary | ICD-10-CM | POA: Insufficient documentation

## 2018-09-24 DIAGNOSIS — Z09 Encounter for follow-up examination after completed treatment for conditions other than malignant neoplasm: Secondary | ICD-10-CM

## 2018-09-24 DIAGNOSIS — Z9221 Personal history of antineoplastic chemotherapy: Secondary | ICD-10-CM | POA: Insufficient documentation

## 2018-09-24 DIAGNOSIS — G629 Polyneuropathy, unspecified: Secondary | ICD-10-CM | POA: Insufficient documentation

## 2018-09-24 DIAGNOSIS — R102 Pelvic and perineal pain: Secondary | ICD-10-CM | POA: Insufficient documentation

## 2018-09-24 DIAGNOSIS — Z923 Personal history of irradiation: Secondary | ICD-10-CM | POA: Insufficient documentation

## 2018-09-24 DIAGNOSIS — C20 Malignant neoplasm of rectum: Secondary | ICD-10-CM | POA: Diagnosis present

## 2018-09-24 DIAGNOSIS — N390 Urinary tract infection, site not specified: Secondary | ICD-10-CM

## 2018-09-24 LAB — URINALYSIS, ROUTINE W REFLEX MICROSCOPIC
Bilirubin Urine: NEGATIVE
Glucose, UA: NEGATIVE mg/dL
Ketones, ur: NEGATIVE mg/dL
Nitrite: POSITIVE — AB
Protein, ur: 30 mg/dL — AB
Specific Gravity, Urine: 1.015 (ref 1.005–1.030)
Trans Epithel, UA: 1
WBC, UA: >50 WBC/hpf — ABNORMAL HIGH (ref 0–5)
pH: 7 (ref 5.0–8.0)

## 2018-09-24 MED ORDER — CIPROFLOXACIN HCL 500 MG PO TABS: 500.0000 mg | ORAL_TABLET | Freq: Two times a day (BID) | ORAL | 0 refills | Status: DC

## 2018-09-24 MED ORDER — OXYCODONE HCL ER 30 MG PO T12A: 30.0000 mg | EXTENDED_RELEASE_TABLET | Freq: Two times a day (BID) | ORAL | 0 refills | Status: DC

## 2018-09-24 MED ORDER — OXYCODONE HCL 10 MG PO TABS: 15.0000 mg | ORAL_TABLET | ORAL | 0 refills | Status: DC | PRN

## 2018-09-25 NOTE — Progress Notes (Signed)
Subjective:     Jonathan Wright  Here for surgical follow-up.  Status post left inguinal lymph node biopsy. Objective:    BP 131/75 (BP Location: Left Arm, Patient Position: Sitting, Cuff Size: Normal)   Pulse (!) 113   Temp 97.8 F (36.6 C) (Temporal)   Resp 18   Wt 155 lb 3.2 oz (70.4 kg)   BMI 22.27 kg/m   General:  alert, cooperative and no distress  Left groin incision healing well. Final pathology reveals recurrent rectal cancer.  Patient is aware.  Oncology notified.     Assessment:    Doing well postoperatively.    Plan:   Follow-up as needed.  Radiology suggest fistulogram with pelvic CT.  This is being ordered by oncology.

## 2018-10-02 ENCOUNTER — Encounter (HOSPITAL_COMMUNITY): Payer: Self-pay

## 2018-10-02 ENCOUNTER — Encounter (HOSPITAL_COMMUNITY): Payer: Self-pay | Admitting: Hematology

## 2018-10-03 ENCOUNTER — Other Ambulatory Visit (HOSPITAL_COMMUNITY): Payer: Self-pay | Admitting: *Deleted

## 2018-10-03 DIAGNOSIS — C20 Malignant neoplasm of rectum: Secondary | ICD-10-CM

## 2018-10-03 NOTE — Progress Notes (Signed)
I spoke with Dr. Arnoldo Morale who has had conversation with Dr. Thornton Papas in radiology and in order to do fistulogram they need CT pelvis w/o contrast.  Orders have been placed and our schedulers will call patient with appointment.

## 2018-10-07 ENCOUNTER — Ambulatory Visit (HOSPITAL_COMMUNITY): Payer: Self-pay | Admitting: Hematology

## 2018-10-09 ENCOUNTER — Ambulatory Visit (HOSPITAL_COMMUNITY)
Admission: RE | Admit: 2018-10-09 | Discharge: 2018-10-09 | Disposition: A | Discharge status: Home / Self Care | Payer: BLUE CROSS/BLUE SHIELD | Source: Ambulatory Visit | Attending: Hematology | Admitting: Hematology

## 2018-10-09 ENCOUNTER — Other Ambulatory Visit (HOSPITAL_COMMUNITY): Payer: Self-pay | Admitting: Nurse Practitioner

## 2018-10-09 DIAGNOSIS — F32A Depression, unspecified: Secondary | ICD-10-CM

## 2018-10-09 DIAGNOSIS — C20 Malignant neoplasm of rectum: Secondary | ICD-10-CM | POA: Insufficient documentation

## 2018-10-09 DIAGNOSIS — F329 Major depressive disorder, single episode, unspecified: Secondary | ICD-10-CM

## 2018-10-09 MED ORDER — IOPAMIDOL (ISOVUE-300) INJECTION 61%
50.0000 mL | Freq: Once | INTRAVENOUS | Status: AC | PRN
  Administered 2018-10-09: 20 mL

## 2018-10-09 MED ORDER — POVIDONE-IODINE 10 % EX SOLN
CUTANEOUS | Status: AC
  Filled 2018-10-09: qty 15

## 2018-10-09 MED ORDER — FLUOXETINE HCL 20 MG PO CAPS: 20.0000 mg | ORAL_CAPSULE | Freq: Every day | ORAL | 3 refills | Status: DC

## 2018-10-10 ENCOUNTER — Other Ambulatory Visit (HOSPITAL_COMMUNITY): Payer: Self-pay | Admitting: *Deleted

## 2018-10-10 DIAGNOSIS — K6289 Other specified diseases of anus and rectum: Secondary | ICD-10-CM

## 2018-10-10 DIAGNOSIS — C20 Malignant neoplasm of rectum: Secondary | ICD-10-CM

## 2018-10-10 MED ORDER — OXYCODONE HCL 10 MG PO TABS: 15.0000 mg | ORAL_TABLET | ORAL | 0 refills | Status: DC | PRN

## 2018-10-11 ENCOUNTER — Encounter (HOSPITAL_COMMUNITY): Payer: Self-pay | Admitting: Hematology

## 2018-10-11 ENCOUNTER — Ambulatory Visit (HOSPITAL_COMMUNITY): Payer: Self-pay | Admitting: Hematology

## 2018-10-11 ENCOUNTER — Inpatient Hospital Stay (HOSPITAL_COMMUNITY): Payer: BLUE CROSS/BLUE SHIELD | Admitting: Hematology

## 2018-10-11 VITALS — BP 111/75 | HR 124 | Temp 97.8°F | Resp 18 | Wt 158.7 lb

## 2018-10-11 DIAGNOSIS — Z933 Colostomy status: Secondary | ICD-10-CM

## 2018-10-11 DIAGNOSIS — Z7189 Other specified counseling: Secondary | ICD-10-CM | POA: Insufficient documentation

## 2018-10-11 DIAGNOSIS — C20 Malignant neoplasm of rectum: Secondary | ICD-10-CM

## 2018-10-11 DIAGNOSIS — Z9221 Personal history of antineoplastic chemotherapy: Secondary | ICD-10-CM

## 2018-10-11 DIAGNOSIS — R102 Pelvic and perineal pain: Secondary | ICD-10-CM

## 2018-10-11 DIAGNOSIS — Z79899 Other long term (current) drug therapy: Secondary | ICD-10-CM

## 2018-10-11 DIAGNOSIS — Z923 Personal history of irradiation: Secondary | ICD-10-CM | POA: Diagnosis not present

## 2018-10-11 DIAGNOSIS — F419 Anxiety disorder, unspecified: Secondary | ICD-10-CM

## 2018-10-11 DIAGNOSIS — G629 Polyneuropathy, unspecified: Secondary | ICD-10-CM

## 2018-10-11 NOTE — Progress Notes (Signed)
START ON PATHWAY REGIMEN - Colorectal     A cycle is every 14 days:     Irinotecan      Leucovorin      5-Fluorouracil      5-Fluorouracil      Bevacizumab-xxxx   **Always confirm dose/schedule in your pharmacy ordering system**  Patient Characteristics: Distant Metastases, First Line, Nonsurgical Candidate, KRAS Mutation Positive/Unknown, BRAF Wild-Type/Unknown, PS = 0,1; Bevacizumab Eligible Therapeutic Status: Distant Metastases BRAF Mutation Status: Wild-Type (no mutation) KRAS/NRAS Mutation Status: Mutation Positive Line of Therapy: First Line Performance Status: PS = 0, 1 Bevacizumab Eligibility: Eligible Intent of Therapy: Non-Curative / Palliative Intent, Discussed with Patient

## 2018-10-11 NOTE — Progress Notes (Signed)
Jonathan Wright, [place] [id number]   CLINIC:  Medical Oncology/Hematology  PCP:  Deland Pretty, [place]  [id number] [phone]   REASON FOR VISIT: Follow-up for rectal adenocarcinoma   CURRENT THERAPY: Starting folfox   BRIEF ONCOLOGIC HISTORY:  Oncology History   Stage IIIB rectal adenocarcinoma, mT2N2M0 Treated with 5Gy x 5 at [facility]     Rectal cancer [facility])   09/20/2015 Procedure    colonoscopy with firm rectal mass and 4 cm polyp in proximal rectum, infiltrating non-obstructing large mass within the distal rectum extending towards anal verge    09/20/2015 Miscellaneous    Microsatellite stable.     09/28/2015 Imaging    CT C/A/P no evidence of distant metastatic disease. irregular thickening of the low rectum and anal canal, several enlarged perirectal LN, indeterminate 69m RUL nodule    09/28/2015 Imaging    MRI pelvis assym diffuse circum lobular thickening of rectum, near full wall thickness extension along lower anterior rectum W/O extra serosal extension c/w known adeno, mult. perirectal LN    12/06/2015 - 12/10/2015 Radiation Therapy    [facility] Dr. AAngelina Ok  2500.00 cGy, 500.00 cGy per day in 5 fractions delivered 1x per day to the 96.0% Isodose line Treatment Dates: 12/06/2015 through 12/10/2015.     01/03/2016 Procedure    PICC placed    01/04/2016 - 03/14/2016 Chemotherapy    FOLFOX x 6 cycles    06/07/2016 Definitive Surgery    Robotic assisted APR with coloanal anastomosis and DLI [facility]   06/19/2016 - 06/23/2016 Hospital Admission    Admit date: 06/19/2016 Admission diagnosis: Sepsis Additional comments: Sepsis due to multiple gram-negative active uremia. Likely source [facility] general surgery following, perirectal abscess draining by itself, white count and temperatures improved.  He had a PICC line from prior to this admission which was line removed on 06/21/2016, vancomycin stopped on 06/22/2016, He tolerated test dose of Rocephin well on 06/22/2016 without any issues, [facility] data, requested to follow with PCP in 3 days to make sure he is tolerating oral medications well and has continued to defervesce, request PCP to please check final culture and sensitivity results which should be back in 3 days. He will follow with general surgery outpatient as well.    02/15/2017 Imaging    CT CAP- 1. Well-formed presacral abscess is decreased in size but persistent. 2. No evidence of colorectal cancer metastasis in the abdomen pelvis. 3. Loop ileostomy in the RIGHT lower quadrant.    04/05/2017 Imaging    CT CAP-IMPRESSION: 1. Status post loop right-sided ileostomy with sigmoid to anal anastomosis. 2. Decrease in presacral fluid collection, consistent with abscess. New foci of extraluminal gas, suspicious for fistulous communication to the lower sigmoid. 3. No evidence of metastatic disease.    04/20/2017 Imaging    MRI pelvis w/ and w/o contrast IMPRESSION: 1. No appreciable change in chronic complex presacral space abscess, which demonstrates four separate sites of fistulous communication to the sigmoid colon and colo-anal anastomosis, as detailed. No discrete mass or other findings to suggest local tumor recurrence. 2. No evidence of metastatic disease in the pelvis.    10/11/2018 Cancer Staging    Staging form: Colon and Rectum, AJCC 7th Edition - Pathologic: Stage IVB (TX, N1, M1b) -  Signed by Derek Jack, MD on 10/11/2018    10/16/2018 -  Chemotherapy    The patient had PALONOSETRON HCL INJECTION 0.25 MG/5ML, 0.25 mg, Intravenous,  Once, 0 of 4 cycles bevacizumab (AVASTIN) 350 mg in sodium chloride 0.9 % 100 mL chemo infusion, 5 mg/kg, Intravenous,  Once, 0 of 4 cycles irinotecan  (CAMPTOSAR) 340 mg in dextrose 5 % 500 mL chemo infusion, 180 mg/m2, Intravenous,  Once, 0 of 4 cycles leucovorin 756 mg in dextrose 5 % 250 mL infusion, 400 mg/m2, Intravenous,  Once, 0 of 4 cycles fluorouracil (ADRUCIL) chemo injection 750 mg, 400 mg/m2, Intravenous,  Once, 0 of 4 cycles fluorouracil (ADRUCIL) 4,550 mg in sodium chloride 0.9 % 59 mL chemo infusion, 2,400 mg/m2, Intravenous, 1 Day/Dose, 0 of 4 cycles  for chemotherapy treatment.       CANCER STAGING: Cancer Staging Rectal cancer [facility]) Staging form: Colon and Rectum, AJCC 7th Edition - Clinical stage from 01/03/2016: Stage IIIB (T2, N2a, M0) - Signed by Baird Cancer, PA-C on 01/03/2016 - Pathologic: Stage IVB (TX, N1, M1b) - Signed by Derek Jack, MD on 10/11/2018    INTERVAL HISTORY:  Jonathan Wright 53 y.o. male returns for routine follow-up for rectal adenocarcinoma. He is doing well since his last visit. He reports he still has a small drip of drainage fluid. It is a thick tan colored fluid. He reports the pain medication is covering his pain at this time. Denies any nausea, vomiting, or diarrhea. Denies any new pains. Had not noticed any recent bleeding such as epistaxis, hematuria or hematochezia. Denies recent chest pain on exertion, shortness of breath on minimal exertion, pre-syncopal episodes, or palpitations. Denies any numbness or tingling in hands or feet. Denies any recent fevers, infections, or recent hospitalizations. Patient reports appetite at 100% and energy level at 50%. He is eating well and has gained weight since his surgery.     REVIEW OF SYSTEMS:  Review of Systems  Constitutional: Positive for fatigue.  Gastrointestinal: Positive for rectal pain.  All other systems reviewed and are negative.    PAST MEDICAL/SURGICAL HISTORY:  Past Medical History:  Diagnosis Date  . Anxiety   . Depression   . Rectal cancer ([place]) 12/17/2015   Past Surgical History:  Procedure Laterality Date  .  ABDOMINAL PERINEAL BOWEL RESECTION  06/07/2016   [facility]  . COLON SURGERY    . DIVERTING ILEOSTOMY  06/07/2016   [facility]  . INGUINAL LYMPH NODE BIOPSY Left 09/18/2018   Procedure: LEFT INGUINAL LYMPH NODE BIOPSY;  Surgeon: Aviva Signs, MD;  Location: [facility];  Service: General;  Laterality: Left;  . LAPAROSCOPIC LOW ANTERIOR RESCECTION WITH COLOANAL ANASTOMOSIS  06/07/2016   [facility]  . PERIPHERALLY INSERTED CENTRAL CATHETER INSERTION  11/2015     SOCIAL HISTORY:  Social History   Socioeconomic History  . Marital status: Divorced    Spouse name: Not on file  . Number of children: Not on file  . Years of education: Not on file  . Highest education level: Not on file  Occupational History  . Not on file  Social Needs  . Financial resource strain: Not on file  . Food insecurity:    Worry: Not on file    Inability: Not on file  . Transportation needs:    Medical: Not on file    Non-medical: Not on file  Tobacco Use  . Smoking status: Never Smoker  . Smokeless tobacco: Never Used  Substance and Sexual Activity  .  Alcohol use: Yes    Comment: occ.   . Drug use: No  . Sexual activity: Not on file  Lifestyle  . Physical activity:    Days per week: Not on file    Minutes per session: Not on file  . Stress: Not on file  Relationships  . Social connections:    Talks on phone: Not on file    Gets together: Not on file    Attends religious service: Not on file    Active member of club or organization: Not on file    Attends meetings of clubs or organizations: Not on file    Relationship status: Not on file  . Intimate partner violence:    Fear of current or ex partner: Not on file    Emotionally abused: Not on file    Physically abused: Not on file    Forced sexual activity: Not on file  Other Topics Concern  . Not on file  Social History Narrative  . Not on file    FAMILY HISTORY:  Family History  Problem Relation Age of Onset  . Rectal cancer  Neg Hx     CURRENT MEDICATIONS:  Outpatient Encounter Medications as of 10/11/2018  Medication Sig  . ALPRAZolam (XANAX) 1 MG tablet Take 0.5 tablets (0.5 mg total) by mouth 2 (two) times daily as needed for anxiety.  Marland Kitchen FLUoxetine (PROZAC) 20 MG capsule Take 1 capsule (20 mg total) by mouth daily.  . Oxycodone HCl 10 MG TABS Take 1.5 tablets (15 mg total) by mouth every 3 (three) hours as needed.  Marland Kitchen oxyCODONE 30 MG 12 hr tablet Take 1 tablet (30 mg total) by mouth every 12 (twelve) hours.   No facility-administered encounter medications on file as of 10/11/2018.     ALLERGIES:  No Known Allergies   PHYSICAL EXAM:  ECOG Performance status: 1  Vitals:   10/11/18 1158  BP: 111/75  Pulse: (!) 124  Resp: 18  Temp: 97.8 F (36.6 C)  SpO2: 100%   Filed Weights   10/11/18 1158  Weight: 158 lb 11.2 oz (72 kg)    Physical Exam Constitutional:      Appearance: Normal appearance. He is normal weight.  Musculoskeletal: Normal range of motion.  Skin:    General: Skin is warm and dry.  Neurological:     Mental Status: He is alert and oriented to person, place, and time. Mental status is at baseline.  Psychiatric:        Mood and Affect: Mood normal.        Behavior: Behavior normal.        Thought Content: Thought content normal.        Judgment: Judgment normal.    PICC line site is within normal limits.  LABORATORY DATA:  I have reviewed the labs as listed.  CBC    Component Value Date/Time   WBC 5.6 08/06/2018 1043   RBC 4.04 (L) 08/06/2018 1043   HGB 11.2 (L) 08/06/2018 1043   HCT 37.2 (L) 08/06/2018 1043   PLT 305 08/06/2018 1043   MCV 92.1 08/06/2018 1043   MCH 27.7 08/06/2018 1043   MCHC 30.1 08/06/2018 1043   RDW 15.8 (H) 08/06/2018 1043   LYMPHSABS 0.8 08/06/2018 1043   MONOABS 0.8 08/06/2018 1043   EOSABS 0.4 08/06/2018 1043   BASOSABS 0.1 08/06/2018 1043   CMP Latest Ref Rng & Units 08/06/2018 05/20/2018 12/25/2017  Glucose 70 - 99 mg/dL 134(H) 153(H)  101(H)  BUN  6 - 20 mg/dL 18 20 13   Creatinine 0.61 - 1.24 mg/dL 1.08 1.72(H) 0.96  Sodium 135 - 145 mmol/L 137 133(L) 139  Potassium 3.5 - 5.1 mmol/L 4.0 4.1 3.9  Chloride 98 - 111 mmol/L 108 103 105  CO2 22 - 32 mmol/L 21(L) 22 25  Calcium 8.9 - 10.3 mg/dL 9.0 9.0 9.5  Total Protein 6.5 - 8.1 g/dL 7.2 8.3(H) 7.8  Total Bilirubin 0.3 - 1.2 mg/dL 0.1(L) 0.4 0.7  Alkaline Phos 38 - 126 U/L 93 149(H) 72  AST 15 - 41 U/L 19 17 15   ALT 0 - 44 U/L 18 21 11(L)       DIAGNOSTIC IMAGING:  I have independently reviewed the scans and discussed with the patient.   I have reviewed Francene Finders, NP's note and agree with the documentation.  I personally performed a face-to-face visit, made revisions and my assessment and plan is as follows.    ASSESSMENT & PLAN:   Rectal cancer ([place]) 1.  Metastatic rectal adenocarcinoma: Foundation 1 testing shows KRAS G13D, NRAS-WT, MS-stable, PIK3CA, TP53 - Stage IIIb (T2N2) rectal adenocarcinoma, treated with radiation 5 Gy over 5 fractions at [facility], FOLFOX 6 cycles from 01/04/2016 through 03/14/2016, status post robotic APR with coloanal anastomosis and diverting loop ileostomy on 06/07/2016. - Post anastomotic dehiscence in October 2017 with pelvic abscess - Rectal biopsy on 03/19/2017 with invasive moderately differentiated adenocarcinoma, rebiopsy in October 2018 with local recurrence at anastomosis associated with an nonhealing pelvic abscess - Chemoradiation therapy with Xeloda from 07/09/2017 through 08/01/2017, 30.6 Gray in 17 fractions at [place] scan on 01/04/2018 shows increased uptake in the presacral collection area.  No evidence of distant metastasis.  - He was seen by Dr. Percell Boston at [facility] clinic.  An MRI was repeated.  A CT-guided biopsy with CT-guided drain placement of the pelvic fluid collection was done on 03/22/2018.  Biopsy consistent with adenocarcinoma. - He was seen by Dr. Suanne Marker at  [facility] clinic.  Pelvic exenteration with cystectomy, prostatectomy, resection of rectum/anus/anastomosis, the pelvic inflammation, pelvic sidewall with IORT, urinary reconstruction with ileal conduit and end colostomy (possible ileostomy takedown) and flap reconstruction was done on 06/06/2018.  He had a positive radial margin. - PET CT scan on 09/10/2018 (compared to PET scan from May 2019) showed circumferential hypermetabolism in the pelvic floor surrounding the presacral fluid collection, slightly increased in size compared to the CT scan from 08/01/2018.  Pelvic floor/left hemipelvic fluid collection new since surgery is stable since the last CT in December.  1.5 cm left groin lymph node slightly increased in size from December scan. -CEA was mildly elevated at 4.8. -Left inguinal lymph node biopsy on 09/18/2018 consistent with metastatic adenocarcinoma, consistent with colorectal primary. - CT fistulogram of the pelvis was done on 10/09/2018 which showed presacral fluid and gas collection, measuring 6.6 cm suspicious for abscess.  Percutaneous injection of contrast partly opacifies only the extra-anatomic fluid collection in the presacral space, confirming continuity of this possible abscess with the perineum.  Redemonstration of lucency involving the sacrum, adjacent to the fluid collection.  Small lymph nodes in bilateral inguinal region. -He has a virtual consultation with Dr. Rowe Robert on 10/15/2018. -We have talked about palliative chemotherapy to control his metastatic disease.  He has very minor pre-existing neuropathy from prior oxaliplatin in his feet.  He also had terrible time with cold sensitivity.  Hence I have recommended FOLFIRI based regimen.  We can introduce  Avastin after first cycle.  We talked about side effects in detail.  We also talked about the prognosis in detail. - He has constant drainage of serous/brownish fluid from the flap behind his scrotum.  This is likely coming from the  pelvic fluid collection. -He has a PICC line in the right arm which was placed prior to surgery.  He would prefer to continue using PICC line rather than a port placement.  2.  Peripheral neuropathy: -He has some vague neuropathy in the feet with them feeling cold.  3.  Pelvic pain: -He is continuing OxyContin 20 mg twice daily.  He takes oxycodone 15 mg every 3-4 hours as needed.  4.  Anxiety: -He takes Xanax as needed which is helping.   Total time spent is 40 minutes with more than 50% of the time spent face-to-face discussing scan results, biopsy results, treatment options and coordination of care.  Orders placed this encounter:  Orders Placed This Encounter  Procedures  . Magnesium  . CBC with Differential/Platelet  . Comprehensive metabolic panel  . CEA      Derek Jack, MD [place] [phone]

## 2018-10-11 NOTE — Patient Instructions (Signed)
[facility] at [facility] Discharge Instructions     Thank you for choosing [facility] at [facility] to provide your oncology and hematology care.  To afford each patient quality time with our provider, please arrive at least 15 minutes before your scheduled appointment time.   If you have a lab appointment with the Cancer Center please come in thru the  Main Entrance and check in at the main information desk  You need to re-schedule your appointment should you arrive 10 or more minutes late.  We strive to give you quality time with our providers, and arriving late affects you and other patients whose appointments are after yours.  Also, if you no show three or more times for appointments you may be dismissed from the clinic at the providers discretion.     Again, thank you for choosing [facility].  Our hope is that these requests will decrease the amount of time that you wait before being seen by our physicians.       _____________________________________________________________  Should you have questions after your visit to [facility], please contact our office at [phone] between the hours of 8:00 a.m. and 4:30 p.m.  Voicemails left after 4:00 p.m. will not be returned until the following business day.  For prescription refill requests, have your pharmacy contact our office and allow 72 hours.    Cancer Center Support Programs:   > Cancer Support Group  2nd Tuesday of the month 1pm-2pm, Journey Room    

## 2018-10-11 NOTE — Assessment & Plan Note (Signed)
1.  Metastatic rectal adenocarcinoma: Foundation 1 testing shows KRAS G13D, NRAS-WT, MS-stable, PIK3CA, TP53 - Stage IIIb (T2N2) rectal adenocarcinoma, treated with radiation 5 Gy over 5 fractions at [facility], FOLFOX 6 cycles from 01/04/2016 through 03/14/2016, status post robotic APR with coloanal anastomosis and diverting loop ileostomy on 06/07/2016. - Post anastomotic dehiscence in October 2017 with pelvic abscess - Rectal biopsy on 03/19/2017 with invasive moderately differentiated adenocarcinoma, rebiopsy in October 2018 with local recurrence at anastomosis associated with an nonhealing pelvic abscess - Chemoradiation therapy with Xeloda from 07/09/2017 through 08/01/2017, 30.6 Gray in 17 fractions at [place] scan on 01/04/2018 shows increased uptake in the presacral collection area.  No evidence of distant metastasis.  - He was seen by Dr. Percell Boston at [facility] clinic.  An MRI was repeated.  A CT-guided biopsy with CT-guided drain placement of the pelvic fluid collection was done on 03/22/2018.  Biopsy consistent with adenocarcinoma. - He was seen by Dr. Suanne Marker at [facility] clinic.  Pelvic exenteration with cystectomy, prostatectomy, resection of rectum/anus/anastomosis, the pelvic inflammation, pelvic sidewall with IORT, urinary reconstruction with ileal conduit and end colostomy (possible ileostomy takedown) and flap reconstruction was done on 06/06/2018.  He had a positive radial margin. - PET CT scan on 09/10/2018 (compared to PET scan from May 2019) showed circumferential hypermetabolism in the pelvic floor surrounding the presacral fluid collection, slightly increased in size compared to the CT scan from 08/01/2018.  Pelvic floor/left hemipelvic fluid collection new since surgery is stable since the last CT in December.  1.5 cm left groin lymph node slightly increased in size from December scan. -CEA was mildly elevated at 4.8. -Left inguinal lymph  node biopsy on 09/18/2018 consistent with metastatic adenocarcinoma, consistent with colorectal primary. - CT fistulogram of the pelvis was done on 10/09/2018 which showed presacral fluid and gas collection, measuring 6.6 cm suspicious for abscess.  Percutaneous injection of contrast partly opacifies only the extra-anatomic fluid collection in the presacral space, confirming continuity of this possible abscess with the perineum.  Redemonstration of lucency involving the sacrum, adjacent to the fluid collection.  Small lymph nodes in bilateral inguinal region. -He has a virtual consultation with Dr. Rowe Robert on 10/15/2018. -We have talked about palliative chemotherapy to control his metastatic disease.  He has very minor pre-existing neuropathy from prior oxaliplatin in his feet.  He also had terrible time with cold sensitivity.  Hence I have recommended FOLFIRI based regimen.  We can introduce Avastin after first cycle.  We talked about side effects in detail.  We also talked about the prognosis in detail. - He has constant drainage of serous/brownish fluid from the flap behind his scrotum.  This is likely coming from the pelvic fluid collection. -He has a PICC line in the right arm which was placed prior to surgery.  He would prefer to continue using PICC line rather than a port placement.  2.  Peripheral neuropathy: -He has some vague neuropathy in the feet with them feeling cold.  3.  Pelvic pain: -He is continuing OxyContin 20 mg twice daily.  He takes oxycodone 15 mg every 3-4 hours as needed.  4.  Anxiety: -He takes Xanax as needed which is helping.

## 2018-10-14 ENCOUNTER — Ambulatory Visit (HOSPITAL_COMMUNITY): Payer: BLUE CROSS/BLUE SHIELD

## 2018-10-21 ENCOUNTER — Inpatient Hospital Stay (HOSPITAL_COMMUNITY): Payer: BLUE CROSS/BLUE SHIELD | Attending: Hematology

## 2018-10-21 ENCOUNTER — Ambulatory Visit (HOSPITAL_COMMUNITY): Payer: Self-pay

## 2018-10-21 ENCOUNTER — Encounter (HOSPITAL_COMMUNITY): Payer: Self-pay

## 2018-10-21 ENCOUNTER — Other Ambulatory Visit (HOSPITAL_COMMUNITY): Payer: Self-pay

## 2018-10-21 DIAGNOSIS — C20 Malignant neoplasm of rectum: Secondary | ICD-10-CM

## 2018-10-21 DIAGNOSIS — F419 Anxiety disorder, unspecified: Secondary | ICD-10-CM | POA: Insufficient documentation

## 2018-10-21 DIAGNOSIS — Z5111 Encounter for antineoplastic chemotherapy: Secondary | ICD-10-CM | POA: Insufficient documentation

## 2018-10-21 DIAGNOSIS — R102 Pelvic and perineal pain: Secondary | ICD-10-CM | POA: Diagnosis not present

## 2018-10-21 LAB — CBC WITH DIFFERENTIAL/PLATELET
Abs Immature Granulocytes: 0.03 10*3/uL (ref 0.00–0.07)
Basophils Absolute: 0 10*3/uL (ref 0.0–0.1)
Basophils Relative: 1 %
EOS ABS: 0.4 10*3/uL (ref 0.0–0.5)
Eosinophils Relative: 8 %
HCT: 32.9 % — ABNORMAL LOW (ref 39.0–52.0)
Hemoglobin: 10.1 g/dL — ABNORMAL LOW (ref 13.0–17.0)
Immature Granulocytes: 1 %
LYMPHS ABS: 0.6 10*3/uL — AB (ref 0.7–4.0)
Lymphocytes Relative: 12 %
MCH: 26.8 pg (ref 26.0–34.0)
MCHC: 30.7 g/dL (ref 30.0–36.0)
MCV: 87.3 fL (ref 80.0–100.0)
MONOS PCT: 11 %
Monocytes Absolute: 0.5 10*3/uL (ref 0.1–1.0)
Neutro Abs: 3.2 10*3/uL (ref 1.7–7.7)
Neutrophils Relative %: 67 %
Platelets: 293 10*3/uL (ref 150–400)
RBC: 3.77 MIL/uL — ABNORMAL LOW (ref 4.22–5.81)
RDW: 14.3 % (ref 11.5–15.5)
WBC: 4.7 10*3/uL (ref 4.0–10.5)
nRBC: 0 % (ref 0.0–0.2)

## 2018-10-21 LAB — COMPREHENSIVE METABOLIC PANEL
ALT: 12 U/L (ref 0–44)
AST: 18 U/L (ref 15–41)
Albumin: 3.3 g/dL — ABNORMAL LOW (ref 3.5–5.0)
Alkaline Phosphatase: 100 U/L (ref 38–126)
Anion gap: 10 (ref 5–15)
BUN: 19 mg/dL (ref 6–20)
CALCIUM: 9 mg/dL (ref 8.9–10.3)
CO2: 24 mmol/L (ref 22–32)
Chloride: 103 mmol/L (ref 98–111)
Creatinine, Ser: 1.03 mg/dL (ref 0.61–1.24)
GFR calc Af Amer: >60 mL/min (ref >60)
GFR calc non Af Amer: >60 mL/min (ref >60)
Glucose, Bld: 141 mg/dL — ABNORMAL HIGH (ref 70–99)
Potassium: 3.9 mmol/L (ref 3.5–5.1)
Sodium: 137 mmol/L (ref 135–145)
Total Bilirubin: 0.4 mg/dL (ref 0.3–1.2)
Total Protein: 7.2 g/dL (ref 6.5–8.1)

## 2018-10-21 LAB — URINALYSIS, ROUTINE W REFLEX MICROSCOPIC
BILIRUBIN URINE: NEGATIVE
Glucose, UA: NEGATIVE mg/dL
Ketones, ur: NEGATIVE mg/dL
Nitrite: POSITIVE — AB
Protein, ur: NEGATIVE mg/dL
Specific Gravity, Urine: 1.013 (ref 1.005–1.030)
WBC, UA: >50 WBC/hpf — ABNORMAL HIGH (ref 0–5)
pH: 6 (ref 5.0–8.0)

## 2018-10-21 LAB — MAGNESIUM: Magnesium: 2 mg/dL (ref 1.7–2.4)

## 2018-10-21 MED ORDER — HEPARIN SOD (PORK) LOCK FLUSH 100 UNIT/ML IV SOLN
250.0000 [IU] | Freq: Once | INTRAVENOUS | Status: AC
  Administered 2018-10-21: 250 [IU] via INTRAVENOUS

## 2018-10-21 MED ORDER — OXYCODONE HCL ER 30 MG PO T12A: 30.0000 mg | EXTENDED_RELEASE_TABLET | Freq: Two times a day (BID) | ORAL | 0 refills | Status: DC

## 2018-10-21 MED ORDER — SODIUM CHLORIDE 0.9% FLUSH
3.0000 mL | INTRAVENOUS | Status: DC | PRN
  Administered 2018-10-21: 3 mL via INTRAVENOUS
  Filled 2018-10-21: qty 10

## 2018-10-21 NOTE — Patient Instructions (Signed)
[place] at [facility] Discharge InstructionsLabs Labs drawn from PICC line. Follow-up as scheduled. Call clinic for any questions or concerns  Thank you for choosing [place] at [facility] to provide your oncology and hematology care.  To afford each patient quality time with our provider, please arrive at least 15 minutes before your scheduled appointment time.   If you have a lab appointment with the [facility] please come in thru the  Main Entrance and check in at the main information desk  You need to re-schedule your appointment should you arrive 10 or more minutes late.  We strive to give you quality time with our providers, and arriving late affects you and other patients whose appointments are after yours.  Also, if you no show three or more times for appointments you may be dismissed from the clinic at the providers discretion.     Again, thank you for choosing [facility].  Our hope is that these requests will decrease the amount of time that you wait before being seen by our physicians.       _____________________________________________________________  Should you have questions after your visit to [facility], please contact our office at [phone] between the hours of 8:00 a.m. and 4:30 p.m.  Voicemails left after 4:00 p.m. will not be returned until the following business day.  For prescription refill requests, have your pharmacy contact our office and allow 72 hours.    Cancer Center Support Programs:   > Cancer Support Group  2nd Tuesday of the month 1pm-2pm, Journey Room

## 2018-10-21 NOTE — Progress Notes (Signed)
Jonathan Wright tolerated PICC line lab draw with flush well without issues. VSS Pt reports continued issues with his urine, including foul odor, even after completing Cipro regimen. Urine specimen obtained at this time. Pt discharged self ambulatory in satisfactory condition accompanied by family member

## 2018-10-22 ENCOUNTER — Encounter (HOSPITAL_COMMUNITY): Payer: Self-pay

## 2018-10-22 ENCOUNTER — Inpatient Hospital Stay (HOSPITAL_COMMUNITY): Payer: BLUE CROSS/BLUE SHIELD

## 2018-10-22 VITALS — BP 95/59 | HR 67 | Temp 98.1°F | Resp 18 | Wt 160.1 lb

## 2018-10-22 DIAGNOSIS — C20 Malignant neoplasm of rectum: Secondary | ICD-10-CM | POA: Diagnosis not present

## 2018-10-22 LAB — CEA: CEA: 10.6 ng/mL — ABNORMAL HIGH (ref 0.0–4.7)

## 2018-10-22 MED ORDER — IRINOTECAN HCL CHEMO INJECTION 100 MG/5ML
180.0000 mg/m2 | Freq: Once | INTRAVENOUS | Status: AC
  Administered 2018-10-22: 340 mg via INTRAVENOUS
  Filled 2018-10-22: qty 2

## 2018-10-22 MED ORDER — SODIUM CHLORIDE 0.9% FLUSH
3.0000 mL | INTRAVENOUS | Status: DC | PRN
  Administered 2018-10-22: 3 mL
  Filled 2018-10-22: qty 10

## 2018-10-22 MED ORDER — FLUOROURACIL CHEMO INJECTION 2.5 GM/50ML
400.0000 mg/m2 | Freq: Once | INTRAVENOUS | Status: AC
  Administered 2018-10-22: 750 mg via INTRAVENOUS
  Filled 2018-10-22: qty 15

## 2018-10-22 MED ORDER — SODIUM CHLORIDE 0.9 % IV SOLN
2400.0000 mg/m2 | INTRAVENOUS | Status: DC
  Administered 2018-10-22: 4550 mg via INTRAVENOUS
  Filled 2018-10-22: qty 91

## 2018-10-22 MED ORDER — PALONOSETRON HCL INJECTION 0.25 MG/5ML
0.2500 mg | Freq: Once | INTRAVENOUS | Status: AC
  Administered 2018-10-22: 0.25 mg via INTRAVENOUS
  Filled 2018-10-22: qty 5

## 2018-10-22 MED ORDER — SODIUM CHLORIDE 0.9 % IV SOLN
Freq: Once | INTRAVENOUS | Status: AC
  Administered 2018-10-22: 10:00:00 via INTRAVENOUS

## 2018-10-22 MED ORDER — PROCHLORPERAZINE MALEATE 10 MG PO TABS: 10.0000 mg | ORAL_TABLET | Freq: Four times a day (QID) | ORAL | 0 refills | Status: DC | PRN

## 2018-10-22 MED ORDER — LEUCOVORIN CALCIUM INJECTION 350 MG
800.0000 mg | Freq: Once | INTRAVENOUS | Status: AC
  Administered 2018-10-22: 800 mg via INTRAVENOUS
  Filled 2018-10-22: qty 35

## 2018-10-22 MED ORDER — ATROPINE SULFATE 1 MG/ML IJ SOLN
0.5000 mg | Freq: Once | INTRAMUSCULAR | Status: AC | PRN
  Administered 2018-10-22: 0.5 mg via INTRAVENOUS
  Filled 2018-10-22: qty 1

## 2018-10-22 MED ORDER — SODIUM CHLORIDE 0.9 % IV SOLN
Freq: Once | INTRAVENOUS | Status: AC
  Administered 2018-10-22: 11:00:00 via INTRAVENOUS

## 2018-10-22 MED ORDER — SODIUM CHLORIDE 0.9 % IV SOLN
10.0000 mg | Freq: Once | INTRAVENOUS | Status: AC
  Administered 2018-10-22: 10 mg via INTRAVENOUS
  Filled 2018-10-22: qty 1

## 2018-10-22 NOTE — Patient Instructions (Signed)
[facility] Discharge Instructions for Patients Receiving Chemotherapy   Beginning January 23rd 2017 lab work for the Cancer Center will be done in the  Main lab at [place] on 1st floor. If you have a lab appointment with the Cancer Center please come in thru the  Main Entrance and check in at the main information desk   Today you received the following chemotherapy agents Irinotecan,Leucovorin and 5FU. Follow-up as scheduled. Call clinic for any questions or concerns  To help prevent nausea and vomiting after your treatment, we encourage you to take your nausea medication   If you develop nausea and vomiting, or diarrhea that is not controlled by your medication, call the clinic.  The clinic phone number is [phone]. Office hours are Monday-Friday 8:30am-5:00pm.  BELOW ARE SYMPTOMS THAT SHOULD BE REPORTED IMMEDIATELY:  *FEVER GREATER THAN 101.0 F  *CHILLS WITH OR WITHOUT FEVER  NAUSEA AND VOMITING THAT IS NOT CONTROLLED WITH YOUR NAUSEA MEDICATION  *UNUSUAL SHORTNESS OF BREATH  *UNUSUAL BRUISING OR BLEEDING  TENDERNESS IN MOUTH AND THROAT WITH OR WITHOUT PRESENCE OF ULCERS  *URINARY PROBLEMS  *BOWEL PROBLEMS  UNUSUAL RASH Items with * indicate a potential emergency and should be followed up as soon as possible. If you have an emergency after office hours please contact your primary care physician or go to the nearest emergency department.  Please call the clinic during office hours if you have any questions or concerns.   You may also contact the Patient Navigator at [phone] should you have any questions or need assistance in obtaining follow up care.      Resources For Cancer Patients and their Caregivers ? American Cancer Society: Can assist with transportation, wigs, general needs, runs Look Good Feel Better.        1-888-227-6333 ? Cancer Care: Provides financial assistance, online support groups, medication/co-pay assistance.   1-800-813-HOPE (4673) ? [facility] Assists [place] cancer patients and their families through emotional , educational and financial support.  336-427-4357 ? [place] DSS Where to apply for food stamps, Medicaid and utility assistance. 336-342-1394 ? [facility]: Transportation to medical appointments. 336-347-2287 ? Social Security Administration: May apply for disability if have a Stage IV cancer. 336-342-7796 1-800-772-1213 ? [place] Aging, Disability and Transit Services: Assists with nutrition, care and transit needs. 336-349-2343         

## 2018-10-22 NOTE — Progress Notes (Signed)
Urinalysis results also reviewed with Dr. Delton Coombes and Avastin to be started with next chemo cycle per MD

## 2018-10-22 NOTE — Progress Notes (Signed)
Jonathan Wright tolerated chemo tx well without complaints or incident. Labs reviewed with Dr. Delton Coombes prior to administering chemotherapy.New drug specific information given to pt and reviewed as well as 5FU pump information. All questions were answered and consent signed. Chemo spill kit and information also given to pt who verbalized understanding. Pt discharged with 5FU pump infusing without issues. VSS upon discharge. Pt discharged self ambulatory in satisfactory condition accompanied by family member

## 2018-10-23 ENCOUNTER — Ambulatory Visit (HOSPITAL_COMMUNITY): Payer: Self-pay | Admitting: Hematology

## 2018-10-23 ENCOUNTER — Encounter (HOSPITAL_COMMUNITY): Payer: Self-pay

## 2018-10-24 ENCOUNTER — Inpatient Hospital Stay (HOSPITAL_COMMUNITY): Payer: BLUE CROSS/BLUE SHIELD

## 2018-10-24 ENCOUNTER — Other Ambulatory Visit (HOSPITAL_COMMUNITY): Payer: Self-pay | Admitting: Nurse Practitioner

## 2018-10-24 VITALS — BP 102/60 | HR 74 | Temp 98.0°F | Resp 18

## 2018-10-24 DIAGNOSIS — C20 Malignant neoplasm of rectum: Secondary | ICD-10-CM | POA: Diagnosis not present

## 2018-10-24 DIAGNOSIS — K6289 Other specified diseases of anus and rectum: Secondary | ICD-10-CM

## 2018-10-24 MED ORDER — OXYCODONE HCL 10 MG PO TABS: 15.0000 mg | ORAL_TABLET | ORAL | 0 refills | Status: DC | PRN

## 2018-10-24 MED ORDER — HEPARIN SOD (PORK) LOCK FLUSH 100 UNIT/ML IV SOLN
500.0000 [IU] | Freq: Once | INTRAVENOUS | Status: AC | PRN
  Administered 2018-10-24: 500 [IU]

## 2018-10-24 NOTE — Progress Notes (Signed)
Jonathan Wright returns today for port de access and flush after 46 hr continous infusion of 32fu. Tolerated infusion without problems. Procedure without incident.

## 2018-10-24 NOTE — Patient Instructions (Signed)
[facility] at [facility] _______________________________________________________________  Thank you for choosing [facility] at [facility] to provide your oncology and hematology care.  To afford each patient quality time with our providers, please arrive at least 15 minutes before your scheduled appointment.  You need to re-schedule your appointment if you arrive 10 or more minutes late.  We strive to give you quality time with our providers, and arriving late affects you and other patients whose appointments are after yours.  Also, if you no show three or more times for appointments you may be dismissed from the clinic.  Again, thank you for choosing [facility] at [facility]. Our hope is that these requests will allow you access to exceptional care and in a timely manner. _______________________________________________________________  If you have questions after your visit, please contact our office at [phone] between the hours of 8:30 a.m. and 5:00 p.m. Voicemails left after 4:30 p.m. will not be returned until the following business day. _______________________________________________________________  For prescription refill requests, have your pharmacy contact our office. _______________________________________________________________  Recommendations made by the consultant and any test results will be sent to your referring physician. _______________________________________________________________ 

## 2018-10-25 ENCOUNTER — Other Ambulatory Visit (HOSPITAL_COMMUNITY): Payer: Self-pay | Admitting: *Deleted

## 2018-11-04 ENCOUNTER — Other Ambulatory Visit: Payer: Self-pay

## 2018-11-04 ENCOUNTER — Other Ambulatory Visit (HOSPITAL_COMMUNITY): Payer: Self-pay

## 2018-11-04 ENCOUNTER — Ambulatory Visit (HOSPITAL_COMMUNITY): Payer: Self-pay

## 2018-11-04 ENCOUNTER — Encounter (HOSPITAL_COMMUNITY): Payer: Self-pay

## 2018-11-04 ENCOUNTER — Ambulatory Visit (HOSPITAL_COMMUNITY): Payer: Self-pay | Admitting: Hematology

## 2018-11-04 ENCOUNTER — Inpatient Hospital Stay (HOSPITAL_COMMUNITY): Payer: BLUE CROSS/BLUE SHIELD

## 2018-11-04 VITALS — BP 127/76 | HR 90 | Temp 98.4°F | Resp 18

## 2018-11-04 DIAGNOSIS — C20 Malignant neoplasm of rectum: Secondary | ICD-10-CM | POA: Diagnosis not present

## 2018-11-04 LAB — COMPREHENSIVE METABOLIC PANEL
ALT: 16 U/L (ref 0–44)
AST: 17 U/L (ref 15–41)
Albumin: 3.4 g/dL — ABNORMAL LOW (ref 3.5–5.0)
Alkaline Phosphatase: 132 U/L — ABNORMAL HIGH (ref 38–126)
Anion gap: 10 (ref 5–15)
BUN: 22 mg/dL — ABNORMAL HIGH (ref 6–20)
CHLORIDE: 105 mmol/L (ref 98–111)
CO2: 22 mmol/L (ref 22–32)
Calcium: 8.6 mg/dL — ABNORMAL LOW (ref 8.9–10.3)
Creatinine, Ser: 1.59 mg/dL — ABNORMAL HIGH (ref 0.61–1.24)
GFR calc Af Amer: 57 mL/min — ABNORMAL LOW (ref >60)
GFR calc non Af Amer: 50 mL/min — ABNORMAL LOW (ref >60)
Glucose, Bld: 140 mg/dL — ABNORMAL HIGH (ref 70–99)
Potassium: 3.6 mmol/L (ref 3.5–5.1)
Sodium: 137 mmol/L (ref 135–145)
Total Bilirubin: 0.3 mg/dL (ref 0.3–1.2)
Total Protein: 7 g/dL (ref 6.5–8.1)

## 2018-11-04 LAB — CBC WITH DIFFERENTIAL/PLATELET
ABS IMMATURE GRANULOCYTES: 0.02 10*3/uL (ref 0.00–0.07)
BASOS PCT: 1 %
Basophils Absolute: 0.1 10*3/uL (ref 0.0–0.1)
Eosinophils Absolute: 0.4 10*3/uL (ref 0.0–0.5)
Eosinophils Relative: 7 %
HCT: 30.3 % — ABNORMAL LOW (ref 39.0–52.0)
Hemoglobin: 9.4 g/dL — ABNORMAL LOW (ref 13.0–17.0)
Immature Granulocytes: 0 %
Lymphocytes Relative: 16 %
Lymphs Abs: 0.9 10*3/uL (ref 0.7–4.0)
MCH: 26.6 pg (ref 26.0–34.0)
MCHC: 31 g/dL (ref 30.0–36.0)
MCV: 85.8 fL (ref 80.0–100.0)
Monocytes Absolute: 0.8 10*3/uL (ref 0.1–1.0)
Monocytes Relative: 14 %
Neutro Abs: 3.4 10*3/uL (ref 1.7–7.7)
Neutrophils Relative %: 62 %
Platelets: 296 10*3/uL (ref 150–400)
RBC: 3.53 MIL/uL — ABNORMAL LOW (ref 4.22–5.81)
RDW: 14.4 % (ref 11.5–15.5)
WBC: 5.5 10*3/uL (ref 4.0–10.5)
nRBC: 0 % (ref 0.0–0.2)

## 2018-11-04 LAB — MAGNESIUM: MAGNESIUM: 2 mg/dL (ref 1.7–2.4)

## 2018-11-04 MED ORDER — SODIUM CHLORIDE 0.9% FLUSH
10.0000 mL | Freq: Once | INTRAVENOUS | Status: AC
  Administered 2018-11-04: 10 mL via INTRAVENOUS

## 2018-11-04 MED ORDER — HEPARIN SOD (PORK) LOCK FLUSH 100 UNIT/ML IV SOLN
300.0000 [IU] | Freq: Once | INTRAVENOUS | Status: AC
  Administered 2018-11-04: 14:00:00 via INTRAVENOUS

## 2018-11-04 NOTE — Progress Notes (Signed)
Jonathan Wright presented for PICC line flush. PICC line located right arm . Good blood return present. PICC line flushed with 71ml NS and 300U/58ml Heparin. Procedure without incident. Patient tolerated procedure well.  Labs drawn today per orders.  Patient stated "he changes his own PICC line dressing"   Vitals stable and discharged home from clinic ambulatory. Follow up as scheduled.

## 2018-11-05 ENCOUNTER — Encounter (HOSPITAL_COMMUNITY): Payer: Self-pay | Admitting: Hematology

## 2018-11-05 ENCOUNTER — Inpatient Hospital Stay (HOSPITAL_COMMUNITY): Payer: BLUE CROSS/BLUE SHIELD

## 2018-11-05 ENCOUNTER — Other Ambulatory Visit: Payer: Self-pay

## 2018-11-05 ENCOUNTER — Inpatient Hospital Stay (HOSPITAL_BASED_OUTPATIENT_CLINIC_OR_DEPARTMENT_OTHER): Payer: BLUE CROSS/BLUE SHIELD | Admitting: Hematology

## 2018-11-05 ENCOUNTER — Other Ambulatory Visit (HOSPITAL_COMMUNITY): Payer: Self-pay | Admitting: *Deleted

## 2018-11-05 VITALS — BP 109/56 | HR 80 | Temp 97.7°F | Resp 18

## 2018-11-05 VITALS — BP 110/68 | HR 97 | Temp 98.9°F | Resp 16 | Wt 161.8 lb

## 2018-11-05 DIAGNOSIS — Z79899 Other long term (current) drug therapy: Secondary | ICD-10-CM | POA: Insufficient documentation

## 2018-11-05 DIAGNOSIS — Z9221 Personal history of antineoplastic chemotherapy: Secondary | ICD-10-CM

## 2018-11-05 DIAGNOSIS — C20 Malignant neoplasm of rectum: Secondary | ICD-10-CM

## 2018-11-05 DIAGNOSIS — Z923 Personal history of irradiation: Secondary | ICD-10-CM | POA: Insufficient documentation

## 2018-11-05 DIAGNOSIS — F319 Bipolar disorder, unspecified: Secondary | ICD-10-CM

## 2018-11-05 DIAGNOSIS — R102 Pelvic and perineal pain: Secondary | ICD-10-CM | POA: Diagnosis not present

## 2018-11-05 DIAGNOSIS — F419 Anxiety disorder, unspecified: Secondary | ICD-10-CM | POA: Diagnosis not present

## 2018-11-05 DIAGNOSIS — Z5111 Encounter for antineoplastic chemotherapy: Secondary | ICD-10-CM | POA: Diagnosis not present

## 2018-11-05 DIAGNOSIS — C775 Secondary and unspecified malignant neoplasm of intrapelvic lymph nodes: Secondary | ICD-10-CM

## 2018-11-05 DIAGNOSIS — G893 Neoplasm related pain (acute) (chronic): Secondary | ICD-10-CM | POA: Insufficient documentation

## 2018-11-05 MED ORDER — LEUCOVORIN CALCIUM INJECTION 350 MG
800.0000 mg | Freq: Once | INTRAVENOUS | Status: AC
  Administered 2018-11-05: 800 mg via INTRAVENOUS
  Filled 2018-11-05: qty 25

## 2018-11-05 MED ORDER — PROCHLORPERAZINE MALEATE 10 MG PO TABS: 10.0000 mg | ORAL_TABLET | Freq: Four times a day (QID) | ORAL | 6 refills | Status: DC | PRN

## 2018-11-05 MED ORDER — PALONOSETRON HCL INJECTION 0.25 MG/5ML
0.2500 mg | Freq: Once | INTRAVENOUS | Status: AC
  Administered 2018-11-05: 0.25 mg via INTRAVENOUS
  Filled 2018-11-05: qty 5

## 2018-11-05 MED ORDER — FLUOROURACIL CHEMO INJECTION 2.5 GM/50ML
400.0000 mg/m2 | Freq: Once | INTRAVENOUS | Status: AC
  Administered 2018-11-05: 750 mg via INTRAVENOUS
  Filled 2018-11-05: qty 15

## 2018-11-05 MED ORDER — SODIUM CHLORIDE 0.9 % IV SOLN
INTRAVENOUS | Status: DC
  Administered 2018-11-05: 10:00:00 via INTRAVENOUS

## 2018-11-05 MED ORDER — OXYCODONE HCL ER 40 MG PO T12A: 40.0000 mg | EXTENDED_RELEASE_TABLET | Freq: Three times a day (TID) | ORAL | 0 refills | Status: DC

## 2018-11-05 MED ORDER — IRINOTECAN HCL CHEMO INJECTION 100 MG/5ML
180.0000 mg/m2 | Freq: Once | INTRAVENOUS | Status: AC
  Administered 2018-11-05: 340 mg via INTRAVENOUS
  Filled 2018-11-05: qty 2

## 2018-11-05 MED ORDER — SODIUM CHLORIDE 0.9 % IV SOLN
10.0000 mg | Freq: Once | INTRAVENOUS | Status: AC
  Administered 2018-11-05: 10 mg via INTRAVENOUS
  Filled 2018-11-05: qty 1

## 2018-11-05 MED ORDER — ATROPINE SULFATE 1 MG/ML IJ SOLN
0.5000 mg | Freq: Once | INTRAMUSCULAR | Status: AC | PRN
  Administered 2018-11-05: 0.5 mg via INTRAVENOUS
  Filled 2018-11-05: qty 1

## 2018-11-05 MED ORDER — SODIUM CHLORIDE 0.9 % IV SOLN
Freq: Once | INTRAVENOUS | Status: AC
  Administered 2018-11-05: 11:00:00 via INTRAVENOUS

## 2018-11-05 MED ORDER — SODIUM CHLORIDE 0.9 % IV SOLN
Freq: Once | INTRAVENOUS | Status: AC
  Administered 2018-11-05: 12:00:00 via INTRAVENOUS

## 2018-11-05 MED ORDER — SODIUM CHLORIDE 0.9% FLUSH
10.0000 mL | INTRAVENOUS | Status: DC | PRN
  Administered 2018-11-05: 10 mL
  Filled 2018-11-05: qty 10

## 2018-11-05 MED ORDER — SODIUM CHLORIDE 0.9 % IV SOLN
2400.0000 mg/m2 | INTRAVENOUS | Status: DC
  Administered 2018-11-05: 4550 mg via INTRAVENOUS
  Filled 2018-11-05: qty 91

## 2018-11-05 NOTE — Patient Instructions (Signed)
[facility] Discharge Instructions for Patients Receiving Chemotherapy  Today you received the following chemotherapy agents   To help prevent nausea and vomiting after your treatment, we encourage you to take your nausea medication   If you develop nausea and vomiting that is not controlled by your nausea medication, call the clinic.   BELOW ARE SYMPTOMS THAT SHOULD BE REPORTED IMMEDIATELY:  *FEVER GREATER THAN 100.5 F  *CHILLS WITH OR WITHOUT FEVER  NAUSEA AND VOMITING THAT IS NOT CONTROLLED WITH YOUR NAUSEA MEDICATION  *UNUSUAL SHORTNESS OF BREATH  *UNUSUAL BRUISING OR BLEEDING  TENDERNESS IN MOUTH AND THROAT WITH OR WITHOUT PRESENCE OF ULCERS  *URINARY PROBLEMS  *BOWEL PROBLEMS  UNUSUAL RASH Items with * indicate a potential emergency and should be followed up as soon as possible.  Feel free to call the clinic should you have any questions or concerns. The clinic phone number is [phone].  Please show the CHEMO ALERT CARD at check-in to the Emergency Department and triage nurse.   

## 2018-11-05 NOTE — Assessment & Plan Note (Signed)
1.  Metastatic rectal adenocarcinoma: Foundation 1 testing shows KRAS G13D, NRAS-WT, MS-stable, PIK3CA, TP53 - Stage IIIb (T2N2) rectal adenocarcinoma, treated with radiation 5 Gy over 5 fractions at [facility], FOLFOX 6 cycles from 01/04/2016 through 03/14/2016, status post robotic APR with coloanal anastomosis and diverting loop ileostomy on 06/07/2016. - Post anastomotic dehiscence in October 2017 with pelvic abscess - Rectal biopsy on 03/19/2017 with invasive moderately differentiated adenocarcinoma, rebiopsy in October 2018 with local recurrence at anastomosis associated with an nonhealing pelvic abscess - Chemoradiation therapy with Xeloda from 07/09/2017 through 08/01/2017, 30.6 Gray in 17 fractions at [place] scan on 01/04/2018 shows increased uptake in the presacral collection area.  No evidence of distant metastasis.  - He was seen by Dr. Percell Boston at [facility] clinic.  An MRI was repeated.  A CT-guided biopsy with CT-guided drain placement of the pelvic fluid collection was done on 03/22/2018.  Biopsy consistent with adenocarcinoma. - He was seen by Dr. Suanne Marker at [facility] clinic.  Pelvic exenteration with cystectomy, prostatectomy, resection of rectum/anus/anastomosis, the pelvic inflammation, pelvic sidewall with IORT, urinary reconstruction with ileal conduit and end colostomy (possible ileostomy takedown) and flap reconstruction was done on 06/06/2018.  He had a positive radial margin. - PET CT scan on 09/10/2018 (compared to PET scan from May 2019) showed circumferential hypermetabolism in the pelvic floor surrounding the presacral fluid collection, slightly increased in size compared to the CT scan from 08/01/2018.  Pelvic floor/left hemipelvic fluid collection new since surgery is stable since the last CT in December.  1.5 cm left groin lymph node slightly increased in size from December scan. -CEA was mildly elevated at 4.8. -Left inguinal lymph  node biopsy on 09/18/2018 consistent with metastatic adenocarcinoma, consistent with colorectal primary. - CT fistulogram of the pelvis was done on 10/09/2018 which showed presacral fluid and gas collection, measuring 6.6 cm suspicious for abscess.  Percutaneous injection of contrast partly opacifies only the extra-anatomic fluid collection in the presacral space, confirming continuity of this possible abscess with the perineum.  Redemonstration of lucency involving the sacrum, adjacent to the fluid collection.  Small lymph nodes in bilateral inguinal region. -He has a virtual consultation with Dr. Rowe Robert on 10/15/2018. 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  He also had major issues with cold sensitivity. -Palliative chemotherapy with cycle 1 FOLFIRI on 10/22/2018. - Perineal drainage has decreased since cycle 1. -He experienced nausea for 3 to 4 days and tiredness for 3 days after first cycle.  Denied any vomiting.  Denied any diarrhea. -We reviewed his blood counts.  He may proceed with cycle 2 without any dose modifications.  I will continue to hold Avastin at this time. - I will introduce Avastin with cycle 3 if he tolerates second cycle well.  2.  Pelvic pain: - He is currently taking OxyContin 30 mg 1/2 tablet twice daily.  He reports that pain comes back after 8 hours. -He is also taking oxycodone 15 mg every 3 hours for breakthrough pain. -I will increase his OxyContin to 40 mg every 8 hours.  He will continue oxycodone as needed for breakthrough.  3.  Anxiety: -He takes Xanax as needed which is helping.

## 2018-11-05 NOTE — Progress Notes (Signed)
Labs reviewed at office visit today with Dr. Delton Coombes, will proceed with treatment per MD. Will give additional fluids today per MD.   Continuous 5FU pump connected today per protocol.  Treatment given per orders. Patient tolerated it well without problems. Vitals stable and discharged home from clinic ambulatory. Follow up as scheduled.   Patient stated he changed his picc line dressing yesterday.

## 2018-11-05 NOTE — Progress Notes (Signed)
[address] [place], [place] [address]   CLINIC:  Medical Oncology/Hematology  PCP:  Deland Pretty, [place] [address] [phone]   REASON FOR VISIT:  Follow-up for rectal adenocarcinoma   CURRENT THERAPY:FOLFIRI   BRIEF ONCOLOGIC HISTORY:  Oncology History   Stage IIIB rectal adenocarcinoma, mT2N2M0 Treated with 5Gy x 5 at [facility]     Rectal cancer [facility])   09/20/2015 Procedure    colonoscopy with firm rectal mass and 4 cm polyp in proximal rectum, infiltrating non-obstructing large mass within the distal rectum extending towards anal verge    09/20/2015 Miscellaneous    Microsatellite stable.     09/28/2015 Imaging    CT C/A/P no evidence of distant metastatic disease. irregular thickening of the low rectum and anal canal, several enlarged perirectal LN, indeterminate 23m RUL nodule    09/28/2015 Imaging    MRI pelvis assym diffuse circum lobular thickening of rectum, near full wall thickness extension along lower anterior rectum W/O extra serosal extension c/w known adeno, mult. perirectal LN    12/06/2015 - 12/10/2015 Radiation Therapy    [facility] Dr. AAngelina Ok  2500.00 cGy, 500.00 cGy per day in 5 fractions delivered 1x per day to the 96.0% Isodose line Treatment Dates: 12/06/2015 through 12/10/2015.     01/03/2016 Procedure    PICC placed    01/04/2016 - 03/14/2016 Chemotherapy    FOLFOX x 6 cycles    06/07/2016 Definitive Surgery    Robotic assisted APR with coloanal anastomosis [facility]   06/19/2016 - 06/23/2016 Hospital Admission    Admit date: 06/19/2016 Admission diagnosis: Sepsis Additional comments: Sepsis due to multiple gram-negative active uremia. Likely source [facility] general surgery following, perirectal abscess draining by itself, white count and temperatures improved. He had  a PICC line from prior to this admission which was line removed on 06/21/2016, vancomycin stopped on 06/22/2016, He tolerated test dose of Rocephin well on 06/22/2016 without any issues, [facility], requested to follow with PCP in 3 days to make sure he is tolerating oral medications well and has continued to defervesce, request PCP to please check final culture and sensitivity results which should be back in 3 days. He will follow with general surgery outpatient as well.    02/15/2017 Imaging    CT CAP- 1. Well-formed presacral abscess is decreased in size but persistent. 2. No evidence of colorectal cancer metastasis in the abdomen pelvis. 3. Loop ileostomy in the RIGHT lower quadrant.    04/05/2017 Imaging    CT CAP-IMPRESSION: 1. Status post loop right-sided ileostomy with sigmoid to anal anastomosis. 2. Decrease in presacral fluid collection, consistent with abscess. New foci of extraluminal gas, suspicious for fistulous communication to the lower sigmoid. 3. No evidence of metastatic disease.    04/20/2017 Imaging    MRI pelvis w/ and w/o contrast IMPRESSION: 1. No appreciable change in chronic complex presacral space abscess, which demonstrates four separate sites of fistulous communication to the sigmoid colon and colo-anal anastomosis, as detailed. No discrete mass or other findings to suggest local tumor recurrence. 2. No evidence of metastatic disease in the pelvis.    10/11/2018 Cancer Staging    Staging form: Colon and Rectum, AJCC 7th Edition - Pathologic: Stage IVB (TX, N1, M1b) - Signed  by Derek Jack, MD on 10/11/2018    10/22/2018 -  Chemotherapy    The patient had palonosetron (ALOXI) injection 0.25 mg, 0.25 mg, Intravenous,  Once, 2 of 4 cycles Administration: 0.25 mg (10/22/2018), 0.25 mg (11/05/2018) bevacizumab (AVASTIN) 350 mg in sodium chloride 0.9 % 100 mL chemo infusion, 5 mg/kg = 350  mg, Intravenous,  Once, 0 of 2 cycles irinotecan (CAMPTOSAR) 340 mg in dextrose 5 % 500 mL chemo infusion, 180 mg/m2 = 340 mg, Intravenous,  Once, 2 of 4 cycles Administration: 340 mg (10/22/2018), 340 mg (11/05/2018) leucovorin 800 mg in dextrose 5 % 250 mL infusion, 756 mg, Intravenous,  Once, 2 of 4 cycles Administration: 800 mg (10/22/2018), 800 mg (11/05/2018) fluorouracil (ADRUCIL) chemo injection 750 mg, 400 mg/m2 = 750 mg, Intravenous,  Once, 2 of 4 cycles Administration: 750 mg (10/22/2018), 750 mg (11/05/2018) fluorouracil (ADRUCIL) 4,550 mg in sodium chloride 0.9 % 59 mL chemo infusion, 2,400 mg/m2 = 4,550 mg, Intravenous, 1 Day/Dose, 2 of 4 cycles Administration: 4,550 mg (10/22/2018), 4,550 mg (11/05/2018)  for chemotherapy treatment.       CANCER STAGING: Cancer Staging Rectal cancer [facility]) Staging form: Colon and Rectum, AJCC 7th Edition - Clinical stage from 01/03/2016: Stage IIIB (T2, N2a, M0) - Signed by Baird Cancer, PA-C on 01/03/2016 - Pathologic: Stage IVB (TX, N1, M1b) - Signed by Derek Jack, MD on 10/11/2018    INTERVAL HISTORY:  Mr. Hopfensperger 53 y.o. male returns for routine follow-up and consideration for next cycle of chemotherapy. He is here today with his partner. He states that is still having severe pain. He states that the first treatment knocked him on his butt. He states that he is taking more of his pain medication due to the pain he is having. He states that he has been taking the nausea medication around the clock for nausea. Denies any vomiting, or diarrhea. Denies any new pains. Had not noticed any recent bleeding such as epistaxis, hematuria or hematochezia. Denies recent chest pain on exertion, shortness of breath on minimal exertion, pre-syncopal episodes, or palpitations. Denies any numbness or tingling in hands or feet. Denies any recent fevers, infections, or recent hospitalizations. Patient reports appetite at 100% and energy level at 50%.       REVIEW OF SYSTEMS:  Review of Systems  Gastrointestinal: Positive for nausea and rectal pain.     PAST MEDICAL/SURGICAL HISTORY:  Past Medical History:  Diagnosis Date   Anxiety    Depression    Rectal cancer ([place]) 12/17/2015   Past Surgical History:  Procedure Laterality Date   ABDOMINAL PERINEAL BOWEL RESECTION  06/07/2016   [facility]   COLON SURGERY     DIVERTING ILEOSTOMY  06/07/2016   [facility]   INGUINAL LYMPH NODE BIOPSY Left 09/18/2018   Procedure: LEFT INGUINAL LYMPH NODE BIOPSY;  Surgeon: Aviva Signs, MD;  Location: [facility];  Service: General;  Laterality: Left;   LAPAROSCOPIC LOW ANTERIOR RESCECTION WITH COLOANAL ANASTOMOSIS  06/07/2016   [facility]   PERIPHERALLY INSERTED CENTRAL CATHETER INSERTION  11/2015     SOCIAL HISTORY:  Social History   Socioeconomic History   Marital status: Divorced    Spouse name: Not on file   Number of children: Not on file   Years of education: Not on file   Highest education level: Not on file  Occupational History   Not on file  Social Needs   Financial resource strain: Not on file   Food insecurity:  Worry: Not on file    Inability: Not on file   Transportation needs:    Medical: Not on file    Non-medical: Not on file  Tobacco Use   Smoking status: Never Smoker   Smokeless tobacco: Never Used  Substance and Sexual Activity   Alcohol use: Yes    Comment: occ.    Drug use: No   Sexual activity: Not on file  Lifestyle   Physical activity:    Days per week: Not on file    Minutes per session: Not on file   Stress: Not on file  Relationships   Social connections:    Talks on phone: Not on file    Gets together: Not on file    Attends religious service: Not on file    Active member of club or organization: Not on file    Attends meetings of clubs or organizations: Not on file    Relationship status: Not on file   Intimate partner violence:    Fear of current or  ex partner: Not on file    Emotionally abused: Not on file    Physically abused: Not on file    Forced sexual activity: Not on file  Other Topics Concern   Not on file  Social History Narrative   Not on file    FAMILY HISTORY:  Family History  Problem Relation Age of Onset   Rectal cancer Neg Hx     CURRENT MEDICATIONS:  Outpatient Encounter Medications as of 11/05/2018  Medication Sig   ALPRAZolam (XANAX) 1 MG tablet Take 0.5 tablets (0.5 mg total) by mouth 2 (two) times daily as needed for anxiety.   FLUoxetine (PROZAC) 20 MG capsule Take 1 capsule (20 mg total) by mouth daily.   oxyCODONE (OXYCONTIN) 40 mg 12 hr tablet Take 1 tablet (40 mg total) by mouth every 8 (eight) hours.   oxyCODONE 30 MG 12 hr tablet Take 1 tablet (30 mg total) by mouth every 12 (twelve) hours.   Oxycodone HCl 10 MG TABS Take 1.5 tablets (15 mg total) by mouth every 3 (three) hours as needed.   prochlorperazine (COMPAZINE) 10 MG tablet Take 1 tablet (10 mg total) by mouth every 6 (six) hours as needed for nausea or vomiting.   [DISCONTINUED] prochlorperazine (COMPAZINE) 10 MG tablet Take 1 tablet (10 mg total) by mouth every 6 (six) hours as needed for nausea or vomiting.   No facility-administered encounter medications on file as of 11/05/2018.     ALLERGIES:  No Known Allergies   PHYSICAL EXAM:  ECOG Performance status: 1  Vitals:   11/05/18 0858  BP: 110/68  Pulse: 97  Resp: 16  Temp: 98.9 F (37.2 C)  SpO2: 99%   Filed Weights   11/05/18 0858  Weight: 161 lb 12.8 oz (73.4 kg)    Physical Exam Constitutional:      Appearance: Normal appearance.  Cardiovascular:     Rate and Rhythm: Normal rate and regular rhythm.     Heart sounds: Normal heart sounds.  Pulmonary:     Effort: Pulmonary effort is normal.     Breath sounds: Normal breath sounds.  Abdominal:     General: Bowel sounds are normal. There is no distension.     Palpations: Abdomen is soft.  Musculoskeletal:         General: No swelling.  Skin:    General: Skin is warm.  Neurological:     General: No focal deficit present.  Mental Status: He is alert and oriented to person, place, and time.  Psychiatric:        Mood and Affect: Mood normal.        Behavior: Behavior normal.      LABORATORY DATA:  I have reviewed the labs as listed.  CBC    Component Value Date/Time   WBC 5.5 11/04/2018 1434   RBC 3.53 (L) 11/04/2018 1434   HGB 9.4 (L) 11/04/2018 1434   HCT 30.3 (L) 11/04/2018 1434   PLT 296 11/04/2018 1434   MCV 85.8 11/04/2018 1434   MCH 26.6 11/04/2018 1434   MCHC 31.0 11/04/2018 1434   RDW 14.4 11/04/2018 1434   LYMPHSABS 0.9 11/04/2018 1434   MONOABS 0.8 11/04/2018 1434   EOSABS 0.4 11/04/2018 1434   BASOSABS 0.1 11/04/2018 1434   CMP Latest Ref Rng & Units 11/04/2018 10/21/2018 08/06/2018  Glucose 70 - 99 mg/dL 140(H) 141(H) 134(H)  BUN 6 - 20 mg/dL 22(H) 19 18  Creatinine 0.61 - 1.24 mg/dL 1.59(H) 1.03 1.08  Sodium 135 - 145 mmol/L 137 137 137  Potassium 3.5 - 5.1 mmol/L 3.6 3.9 4.0  Chloride 98 - 111 mmol/L 105 103 108  CO2 22 - 32 mmol/L 22 24 21(L)  Calcium 8.9 - 10.3 mg/dL 8.6(L) 9.0 9.0  Total Protein 6.5 - 8.1 g/dL 7.0 7.2 7.2  Total Bilirubin 0.3 - 1.2 mg/dL 0.3 0.4 0.1(L)  Alkaline Phos 38 - 126 U/L 132(H) 100 93  AST 15 - 41 U/L _0 ALT 0 - 44 U/L _1 DIAGNOSTIC IMAGING:  I have independently reviewed the scans and discussed with the patient.   I have reviewed Venita Lick LPN's note and agree with the documentation.  I personally performed a face-to-face visit, made revisions and my assessment and plan is as follows.    ASSESSMENT & PLAN:   Rectal cancer ([place]) 1.  Metastatic rectal adenocarcinoma: Foundation 1 testing shows KRAS G13D, NRAS-WT, MS-stable, PIK3CA, TP53 - Stage IIIb (T2N2) rectal adenocarcinoma, treated with radiation 5 Gy over 5 fractions at [facility], FOLFOX 6 cycles from 01/04/2016 through  03/14/2016, status post robotic APR with coloanal anastomosis and diverting loop ileostomy on 06/07/2016. - Post anastomotic dehiscence in October 2017 with pelvic abscess - Rectal biopsy on 03/19/2017 with invasive moderately differentiated adenocarcinoma, rebiopsy in October 2018 with local recurrence at anastomosis associated with an nonhealing pelvic abscess - Chemoradiation therapy with Xeloda from 07/09/2017 through 08/01/2017, 30.6 Gray in 17 fractions at [place] scan on 01/04/2018 shows increased uptake in the presacral collection area.  No evidence of distant metastasis.  - He was seen by Dr. Percell Boston at [facility] clinic.  An MRI was repeated.  A CT-guided biopsy with CT-guided drain placement of the pelvic fluid collection was done on 03/22/2018.  Biopsy consistent with adenocarcinoma. - He was seen by Dr. Suanne Marker at [facility] clinic.  Pelvic exenteration with cystectomy, prostatectomy, resection of rectum/anus/anastomosis, the pelvic inflammation, pelvic sidewall with IORT, urinary reconstruction with ileal conduit and end colostomy (possible ileostomy takedown) and flap reconstruction was done on 06/06/2018.  He had a positive radial margin. - PET CT scan on 09/10/2018 (compared to PET scan from May 2019) showed circumferential hypermetabolism in the pelvic floor surrounding the presacral fluid collection, slightly increased in size compared to the CT scan from 08/01/2018.  Pelvic floor/left hemipelvic fluid collection new since surgery is stable since the  last CT in December.  1.5 cm left groin lymph node slightly increased in size from December scan. -CEA was mildly elevated at 4.8. -Left inguinal lymph node biopsy on 09/18/2018 consistent with metastatic adenocarcinoma, consistent with colorectal primary. - CT fistulogram of the pelvis was done on 10/09/2018 which showed presacral fluid and gas collection, measuring 6.6 cm suspicious for abscess.  Percutaneous  injection of contrast partly opacifies only the extra-anatomic fluid collection in the presacral space, confirming continuity of this possible abscess with the perineum.  Redemonstration of lucency involving the sacrum, adjacent to the fluid collection.  Small lymph nodes in bilateral inguinal region. -He has a virtual consultation with Dr. Rowe Robert on 10/15/2018. 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  He also had major issues with cold sensitivity. -Palliative chemotherapy with cycle 1 FOLFIRI on 10/22/2018. - Perineal drainage has decreased since cycle 1. -He experienced nausea for 3 to 4 days and tiredness for 3 days after first cycle.  Denied any vomiting.  Denied any diarrhea. -We reviewed his blood counts.  He may proceed with cycle 2 without any dose modifications.  I will continue to hold Avastin at this time. - I will introduce Avastin with cycle 3 if he tolerates second cycle well.  2.  Pelvic pain: - He is currently taking OxyContin 30 mg 1/2 tablet twice daily.  He reports that pain comes back after 8 hours. -He is also taking oxycodone 15 mg every 3 hours for breakthrough pain. -I will increase his OxyContin to 40 mg every 8 hours.  He will continue oxycodone as needed for breakthrough.  3.  Anxiety: -He takes Xanax as needed which is helping.  Total time spent is 40 minutes with more than 50% of the time spent face-to-face discussing pain management, chemotherapy side effect management and coordination of care.    Orders placed this encounter:  No orders of the defined types were placed in this encounter.     Derek Jack, MD [place] [phone]

## 2018-11-05 NOTE — Patient Instructions (Addendum)
[place] at [facility] Discharge Instructions  You were seen today by Dr. Delton Coombes. He went over your recent results. He discussed your pain and pain medication. He is going to increase your pain medication. He also discussed your nausea, he would like for you to start taking your medication this afternoon. He will see you back in 2 weeks for labs,Tx and follow up.   Thank you for choosing [place] at [facility] to provide your oncology and hematology care.  To afford each patient quality time with our provider, please arrive at least 15 minutes before your scheduled appointment time.   If you have a lab appointment with the [facility] please come in thru the  Main Entrance and check in at the main information desk  You need to re-schedule your appointment should you arrive 10 or more minutes late.  We strive to give you quality time with our providers, and arriving late affects you and other patients whose appointments are after yours.  Also, if you no show three or more times for appointments you may be dismissed from the clinic at the providers discretion.     Again, thank you for choosing [facility].  Our hope is that these requests will decrease the amount of time that you wait before being seen by our physicians.       _____________________________________________________________  Should you have questions after your visit to [facility], please contact our office at [phone] between the hours of 8:00 a.m. and 4:30 p.m.  Voicemails left after 4:00 p.m. will not be returned until the following business day.  For prescription refill requests, have your pharmacy contact our office and allow 72 hours.    Cancer Center Support Programs:   > Cancer Support Group  2nd Tuesday of the month 1pm-2pm, Journey Room

## 2018-11-07 ENCOUNTER — Other Ambulatory Visit: Payer: Self-pay

## 2018-11-07 ENCOUNTER — Other Ambulatory Visit (HOSPITAL_COMMUNITY): Payer: Self-pay | Admitting: *Deleted

## 2018-11-07 ENCOUNTER — Inpatient Hospital Stay (HOSPITAL_COMMUNITY): Payer: BLUE CROSS/BLUE SHIELD

## 2018-11-07 DIAGNOSIS — C20 Malignant neoplasm of rectum: Secondary | ICD-10-CM

## 2018-11-07 DIAGNOSIS — K6289 Other specified diseases of anus and rectum: Secondary | ICD-10-CM

## 2018-11-07 MED ORDER — OXYCODONE HCL 10 MG PO TABS: 15.0000 mg | ORAL_TABLET | ORAL | 0 refills | Status: DC | PRN

## 2018-11-07 MED ORDER — HEPARIN SOD (PORK) LOCK FLUSH 100 UNIT/ML IV SOLN
250.0000 [IU] | Freq: Once | INTRAVENOUS | Status: AC | PRN
  Administered 2018-11-07: 250 [IU]

## 2018-11-07 NOTE — Patient Instructions (Signed)
[facility] at [facility] _______________________________________________________________  Thank you for choosing [facility] at [facility] to provide your oncology and hematology care.  To afford each patient quality time with our providers, please arrive at least 15 minutes before your scheduled appointment.  You need to re-schedule your appointment if you arrive 10 or more minutes late.  We strive to give you quality time with our providers, and arriving late affects you and other patients whose appointments are after yours.  Also, if you no show three or more times for appointments you may be dismissed from the clinic.  Again, thank you for choosing [facility] at [facility]. Our hope is that these requests will allow you access to exceptional care and in a timely manner. _______________________________________________________________  If you have questions after your visit, please contact our office at [phone] between the hours of 8:30 a.m. and 5:00 p.m. Voicemails left after 4:30 p.m. will not be returned until the following business day. _______________________________________________________________  For prescription refill requests, have your pharmacy contact our office. _______________________________________________________________  Recommendations made by the consultant and any test results will be sent to your referring physician. _______________________________________________________________ 

## 2018-11-07 NOTE — Progress Notes (Signed)
Jonathan Wright returns today for port de access and flush after 46 hr continous infusion of 69fu. Tolerated infusion without problems. Procedure without incident. Patient tolerated procedure well.

## 2018-11-18 ENCOUNTER — Other Ambulatory Visit: Payer: Self-pay

## 2018-11-18 ENCOUNTER — Inpatient Hospital Stay (HOSPITAL_COMMUNITY): Payer: BLUE CROSS/BLUE SHIELD

## 2018-11-18 ENCOUNTER — Other Ambulatory Visit (HOSPITAL_COMMUNITY): Payer: Self-pay

## 2018-11-18 DIAGNOSIS — C20 Malignant neoplasm of rectum: Secondary | ICD-10-CM

## 2018-11-18 LAB — COMPREHENSIVE METABOLIC PANEL
ALT: 12 U/L (ref 0–44)
AST: 14 U/L — ABNORMAL LOW (ref 15–41)
Albumin: 3.2 g/dL — ABNORMAL LOW (ref 3.5–5.0)
Alkaline Phosphatase: 121 U/L (ref 38–126)
Anion gap: 9 (ref 5–15)
BUN: 18 mg/dL (ref 6–20)
CO2: 24 mmol/L (ref 22–32)
Calcium: 8.9 mg/dL (ref 8.9–10.3)
Chloride: 103 mmol/L (ref 98–111)
Creatinine, Ser: 1.34 mg/dL — ABNORMAL HIGH (ref 0.61–1.24)
GFR calc Af Amer: >60 mL/min (ref >60)
GFR calc non Af Amer: >60 mL/min (ref >60)
Glucose, Bld: 107 mg/dL — ABNORMAL HIGH (ref 70–99)
POTASSIUM: 3.9 mmol/L (ref 3.5–5.1)
Sodium: 136 mmol/L (ref 135–145)
Total Bilirubin: 0.5 mg/dL (ref 0.3–1.2)
Total Protein: 7.6 g/dL (ref 6.5–8.1)

## 2018-11-18 LAB — CBC WITH DIFFERENTIAL/PLATELET
Abs Immature Granulocytes: 0.05 10*3/uL (ref 0.00–0.07)
BASOS ABS: 0 10*3/uL (ref 0.0–0.1)
Basophils Relative: 1 %
Eosinophils Absolute: 0.3 10*3/uL (ref 0.0–0.5)
Eosinophils Relative: 4 %
HCT: 31.1 % — ABNORMAL LOW (ref 39.0–52.0)
Hemoglobin: 9.6 g/dL — ABNORMAL LOW (ref 13.0–17.0)
Immature Granulocytes: 1 %
Lymphocytes Relative: 16 %
Lymphs Abs: 1.4 10*3/uL (ref 0.7–4.0)
MCH: 26.3 pg (ref 26.0–34.0)
MCHC: 30.9 g/dL (ref 30.0–36.0)
MCV: 85.2 fL (ref 80.0–100.0)
Monocytes Absolute: 1.3 10*3/uL — ABNORMAL HIGH (ref 0.1–1.0)
Monocytes Relative: 15 %
NRBC: 0 % (ref 0.0–0.2)
Neutro Abs: 5.4 10*3/uL (ref 1.7–7.7)
Neutrophils Relative %: 63 %
Platelets: 331 10*3/uL (ref 150–400)
RBC: 3.65 MIL/uL — ABNORMAL LOW (ref 4.22–5.81)
RDW: 14.8 % (ref 11.5–15.5)
WBC: 8.4 10*3/uL (ref 4.0–10.5)

## 2018-11-18 LAB — MAGNESIUM: Magnesium: 2.3 mg/dL (ref 1.7–2.4)

## 2018-11-18 MED ORDER — HEPARIN SOD (PORK) LOCK FLUSH 100 UNIT/ML IV SOLN
250.0000 [IU] | Freq: Once | INTRAVENOUS | Status: AC
  Administered 2018-11-18: 250 [IU] via INTRAVENOUS

## 2018-11-18 MED ORDER — SODIUM CHLORIDE 0.9% FLUSH
10.0000 mL | Freq: Once | INTRAVENOUS | Status: AC
  Administered 2018-11-18: 10 mL via INTRAVENOUS

## 2018-11-19 ENCOUNTER — Inpatient Hospital Stay (HOSPITAL_COMMUNITY): Payer: BLUE CROSS/BLUE SHIELD

## 2018-11-19 ENCOUNTER — Encounter (HOSPITAL_COMMUNITY): Payer: Self-pay | Admitting: Hematology

## 2018-11-19 ENCOUNTER — Inpatient Hospital Stay (HOSPITAL_BASED_OUTPATIENT_CLINIC_OR_DEPARTMENT_OTHER): Payer: BLUE CROSS/BLUE SHIELD | Admitting: Hematology

## 2018-11-19 VITALS — BP 92/54 | HR 69 | Temp 97.5°F | Resp 18 | Wt 159.1 lb

## 2018-11-19 DIAGNOSIS — K6289 Other specified diseases of anus and rectum: Secondary | ICD-10-CM

## 2018-11-19 DIAGNOSIS — C20 Malignant neoplasm of rectum: Secondary | ICD-10-CM | POA: Diagnosis not present

## 2018-11-19 MED ORDER — LEUCOVORIN CALCIUM INJECTION 350 MG
423.0000 mg/m2 | Freq: Once | INTRAVENOUS | Status: AC
  Administered 2018-11-19: 800 mg via INTRAVENOUS
  Filled 2018-11-19: qty 5

## 2018-11-19 MED ORDER — FLUOROURACIL CHEMO INJECTION 2.5 GM/50ML
400.0000 mg/m2 | Freq: Once | INTRAVENOUS | Status: AC
  Administered 2018-11-19: 750 mg via INTRAVENOUS
  Filled 2018-11-19: qty 15

## 2018-11-19 MED ORDER — ATROPINE SULFATE 1 MG/ML IJ SOLN
0.5000 mg | Freq: Once | INTRAMUSCULAR | Status: AC
  Administered 2018-11-19: 0.5 mg via INTRAVENOUS
  Filled 2018-11-19: qty 1

## 2018-11-19 MED ORDER — FENTANYL 100 MCG/HR TD PT72: 1.0000 | MEDICATED_PATCH | TRANSDERMAL | 0 refills | Status: DC

## 2018-11-19 MED ORDER — SODIUM CHLORIDE 0.9% FLUSH
10.0000 mL | INTRAVENOUS | Status: DC | PRN
  Administered 2018-11-19: 10 mL
  Filled 2018-11-19: qty 10

## 2018-11-19 MED ORDER — PALONOSETRON HCL INJECTION 0.25 MG/5ML
0.2500 mg | Freq: Once | INTRAVENOUS | Status: AC
  Administered 2018-11-19: 0.25 mg via INTRAVENOUS
  Filled 2018-11-19: qty 5

## 2018-11-19 MED ORDER — SODIUM CHLORIDE 0.9 % IV SOLN
10.0000 mg | Freq: Once | INTRAVENOUS | Status: AC
  Administered 2018-11-19: 10 mg via INTRAVENOUS
  Filled 2018-11-19: qty 10

## 2018-11-19 MED ORDER — SODIUM CHLORIDE 0.9 % IV SOLN
Freq: Once | INTRAVENOUS | Status: AC
  Administered 2018-11-19: 11:00:00 via INTRAVENOUS

## 2018-11-19 MED ORDER — NITROFURANTOIN MONOHYD MACRO 100 MG PO CAPS: 100.0000 mg | ORAL_CAPSULE | Freq: Two times a day (BID) | ORAL | 0 refills | Status: DC

## 2018-11-19 MED ORDER — SODIUM CHLORIDE 0.9 % IV SOLN
2400.0000 mg/m2 | INTRAVENOUS | Status: DC
  Administered 2018-11-19: 4550 mg via INTRAVENOUS
  Filled 2018-11-19: qty 91

## 2018-11-19 MED ORDER — IRINOTECAN HCL CHEMO INJECTION 100 MG/5ML
180.0000 mg/m2 | Freq: Once | INTRAVENOUS | Status: AC
  Administered 2018-11-19: 340 mg via INTRAVENOUS
  Filled 2018-11-19: qty 15

## 2018-11-19 NOTE — Assessment & Plan Note (Signed)
1.  Metastatic rectal adenocarcinoma: Foundation 1 testing shows KRAS G13D, NRAS-WT, MS-stable, PIK3CA, TP53 - Stage IIIb (T2N2) rectal adenocarcinoma, treated with radiation 5 Gy over 5 fractions at [facility], FOLFOX 6 cycles from 01/04/2016 through 03/14/2016, status post robotic APR with coloanal anastomosis and diverting loop ileostomy on 06/07/2016. - Post anastomotic dehiscence in October 2017 with pelvic abscess - Rectal biopsy on 03/19/2017 with invasive moderately differentiated adenocarcinoma, rebiopsy in October 2018 with local recurrence at anastomosis associated with an nonhealing pelvic abscess - Chemoradiation therapy with Xeloda from 07/09/2017 through 08/01/2017, 30.6 Gray in 17 fractions at [place] scan on 01/04/2018 shows increased uptake in the presacral collection area.  No evidence of distant metastasis.  - He was seen by Dr. Percell Boston at [facility] clinic.  An MRI was repeated.  A CT-guided biopsy with CT-guided drain placement of the pelvic fluid collection was done on 03/22/2018.  Biopsy consistent with adenocarcinoma. - He was seen by Dr. Suanne Marker at [facility] clinic.  Pelvic exenteration with cystectomy, prostatectomy, resection of rectum/anus/anastomosis, the pelvic inflammation, pelvic sidewall with IORT, urinary reconstruction with ileal conduit and end colostomy (possible ileostomy takedown) and flap reconstruction was done on 06/06/2018.  He had a positive radial margin. - PET CT scan on 09/10/2018 (compared to PET scan from May 2019) showed circumferential hypermetabolism in the pelvic floor surrounding the presacral fluid collection, slightly increased in size compared to the CT scan from 08/01/2018.  Pelvic floor/left hemipelvic fluid collection new since surgery is stable since the last CT in December.  1.5 cm left groin lymph node slightly increased in size from December scan. -CEA was mildly elevated at 4.8. -Left inguinal lymph  node biopsy on 09/18/2018 consistent with metastatic adenocarcinoma, consistent with colorectal primary. - CT fistulogram of the pelvis was done on 10/09/2018 which showed presacral fluid and gas collection, measuring 6.6 cm suspicious for abscess.  Percutaneous injection of contrast partly opacifies only the extra-anatomic fluid collection in the presacral space, confirming continuity of this possible abscess with the perineum.  Redemonstration of lucency involving the sacrum, adjacent to the fluid collection.  Small lymph nodes in bilateral inguinal region. -He has a virtual consultation with Dr. Rowe Robert on 10/15/2018. 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  He also had major issues with cold sensitivity.  -2 cycles of palliative chemotherapy with FOLFIRI on 10/22/2018 and 11/05/2018. -Perineal discharge after cycle 1 has decreased.  However after cycle 2 he had foul-smelling discharge.  This has become better in the last 3 days. -His urine is also foul-smelling.  I have sent a prescription for Macrobid twice daily for 7 days. - He seems to have tolerated second cycle very well.  He had about 4 days of tiredness. - He may proceed with cycle 3 of FOLFIRI today.  If he is doing well on discharge stops, I plan to introduce Avastin with cycle 4.  2.  Pelvic pain: -He is currently taking OxyContin 40 mg every 8 hours. -He still requiring oxycodone 15 mg every 3 hours for breakthrough pain. -I will discontinue his OxyContin.  I will switch him to fentanyl 100 mcg patch.  He will use oxycodone as needed. -I have counseled him to not use the patch if he gets drowsy.   3.  Anxiety: -He takes Xanax as needed which is helping.

## 2018-11-19 NOTE — Progress Notes (Signed)
Labs reviewed at MD visit today. Will proceed with treatment today. Continue to hold avastin per MD.

## 2018-11-19 NOTE — Progress Notes (Signed)
[place], [place] [address]   CLINIC:  Medical Oncology/Hematology  PCP:  Jonathan Wright, [place] [address] [phone]   REASON FOR VISIT:  Follow-up for rectal adenocarcinoma   CURRENT THERAPY:FOLFIRI   BRIEF ONCOLOGIC HISTORY:  Oncology History   Stage IIIB rectal adenocarcinoma, mT2N2M0 Treated with 5Gy x 5 at [facility]     Rectal Wright [facility])   09/20/2015 Procedure    colonoscopy with firm rectal mass and 4 cm polyp in proximal rectum, infiltrating non-obstructing large mass within the distal rectum extending towards anal verge    09/20/2015 Miscellaneous    Microsatellite stable.     09/28/2015 Imaging    CT C/A/P no evidence of distant metastatic disease. irregular thickening of the low rectum and anal canal, several enlarged perirectal LN, indeterminate 23m RUL nodule    09/28/2015 Imaging    MRI pelvis assym diffuse circum lobular thickening of rectum, near full wall thickness extension along lower anterior rectum W/O extra serosal extension c/w known adeno, mult. perirectal LN    12/06/2015 - 12/10/2015 Radiation Therapy    [facility] Dr. AAngelina Ok  2500.00 cGy, 500.00 cGy per day in 5 fractions delivered 1x per day to the 96.0% Isodose line Treatment Dates: 12/06/2015 through 12/10/2015.     01/03/2016 Procedure    PICC placed    01/04/2016 - 03/14/2016 Chemotherapy    FOLFOX x 6 cycles    06/07/2016 Definitive Surgery    Robotic assisted APR with coloanal anastomosis [facility] D[facility]   06/19/2016 - 06/23/2016 Hospital Admission    Admit date: 06/19/2016 Admission diagnosis: Sepsis Additional comments: Sepsis due to multiple gram-negative active uremia. Likely source [facility] general surgery following, perirectal abscess draining by itself, white count and temperatures improved. He had  a PICC line from prior to this admission which was line removed on 06/21/2016, vancomycin stopped on 06/22/2016, He tolerated test dose of Rocephin well on 06/22/2016 without any issues, [facility] data, requested to follow with PCP in 3 days to make sure he is tolerating oral medications well and has continued to defervesce, request PCP to please check final culture and sensitivity results which should be back in 3 days. He will follow with general surgery outpatient as well.    02/15/2017 Imaging    CT CAP- 1. Well-formed presacral abscess is decreased in size but persistent. 2. No evidence of colorectal Wright metastasis in the abdomen pelvis. 3. Loop ileostomy in the RIGHT lower quadrant.    04/05/2017 Imaging    CT CAP-IMPRESSION: 1. Status post loop right-sided ileostomy with sigmoid to anal anastomosis. 2. Decrease in presacral fluid collection, consistent with abscess. New foci of extraluminal gas, suspicious for fistulous communication to the lower sigmoid. 3. No evidence of metastatic disease.    04/20/2017 Imaging    MRI pelvis w/ and w/o contrast IMPRESSION: 1. No appreciable change in chronic complex presacral space abscess, which demonstrates four separate sites of fistulous communication to the sigmoid colon and colo-anal anastomosis, as detailed. No discrete mass or other findings to suggest local tumor recurrence. 2. No evidence of metastatic disease in the pelvis.    10/11/2018 Wright Staging    Staging form: Colon and Rectum, AJCC 7th Edition - Pathologic: Stage IVB (TX, N1, M1b) - Signed  by Jonathan Jack, MD on 10/11/2018    10/22/2018 -  Chemotherapy    The patient had palonosetron (ALOXI) injection 0.25 mg, 0.25 mg, Intravenous,  Once, 3 of 4 cycles Administration: 0.25 mg (10/22/2018), 0.25 mg (11/05/2018), 0.25 mg (11/19/2018) bevacizumab (AVASTIN) 350 mg in sodium chloride 0.9 % 100 mL chemo  infusion, 5 mg/kg = 350 mg, Intravenous,  Once, 0 of 1 cycle irinotecan (CAMPTOSAR) 340 mg in dextrose 5 % 500 mL chemo infusion, 180 mg/m2 = 340 mg, Intravenous,  Once, 3 of 4 cycles Administration: 340 mg (10/22/2018), 340 mg (11/05/2018), 340 mg (11/19/2018) leucovorin 800 mg in dextrose 5 % 250 mL infusion, 756 mg, Intravenous,  Once, 3 of 4 cycles Administration: 800 mg (10/22/2018), 800 mg (11/05/2018), 800 mg (11/19/2018) fluorouracil (ADRUCIL) chemo injection 750 mg, 400 mg/m2 = 750 mg, Intravenous,  Once, 3 of 4 cycles Administration: 750 mg (10/22/2018), 750 mg (11/05/2018), 750 mg (11/19/2018) fluorouracil (ADRUCIL) 4,550 mg in sodium chloride 0.9 % 59 mL chemo infusion, 2,400 mg/m2 = 4,550 mg, Intravenous, 1 Day/Dose, 3 of 4 cycles Administration: 4,550 mg (10/22/2018), 4,550 mg (11/05/2018), 4,550 mg (11/19/2018)  for chemotherapy treatment.       Wright STAGING: Wright Staging Rectal Wright [facility]) Staging form: Colon and Rectum, AJCC 7th Edition - Clinical stage from 01/03/2016: Stage IIIB (T2, N2a, M0) - Signed by Jonathan Cancer, PA-C on 01/03/2016 - Pathologic: Stage IVB (TX, N1, M1b) - Signed by Jonathan Jack, MD on 10/11/2018    INTERVAL HISTORY:  [name] 53 y.o. male returns for routine follow-up and consideration for next cycle of chemotherapy. He is here today with his partner. He states that he is experiencing discharge and it now has a fowl smell. He states that he has strong odor with urine as well. He states that he has pain constantly, taking pain medication around the clock. He only has pain relief when lying down. Denies any nausea, vomiting, or diarrhea. Denies any new pains. Had not noticed any recent bleeding such as epistaxis, hematuria or hematochezia. Denies recent chest pain on exertion, shortness of breath on minimal exertion, pre-syncopal episodes, or palpitations. Denies any numbness or tingling in hands or feet. Denies any recent fevers, infections, or recent  hospitalizations. Patient reports appetite at 100% and energy level at 50%.    REVIEW OF SYSTEMS:  Review of Systems  Constitutional: Positive for fever.     PAST MEDICAL/SURGICAL HISTORY:  Past Medical History:  Diagnosis Date   Anxiety    Depression    Rectal Wright ([place]) 12/17/2015   Past Surgical History:  Procedure Laterality Date   ABDOMINAL PERINEAL BOWEL RESECTION  06/07/2016   [facility]   COLON SURGERY     DIVERTING ILEOSTOMY  06/07/2016   [facility]   INGUINAL LYMPH NODE BIOPSY Left 09/18/2018   Procedure: LEFT INGUINAL LYMPH NODE BIOPSY;  Surgeon: Aviva Signs, MD;  Location: [facility];  Service: General;  Laterality: Left;   LAPAROSCOPIC LOW ANTERIOR RESCECTION WITH COLOANAL ANASTOMOSIS  06/07/2016   [facility]   PERIPHERALLY INSERTED CENTRAL CATHETER INSERTION  11/2015     SOCIAL HISTORY:  Social History   Socioeconomic History   Marital status: Divorced    Spouse name: Not on file   Number of children: Not on file   Years of education: Not on file   Highest education level: Not on file  Occupational History   Not on file  Social Needs   Financial resource strain: Not on file  Food insecurity:    Worry: Not on file    Inability: Not on file   Transportation needs:    Medical: Not on file    Non-medical: Not on file  Tobacco Use   Smoking status: Never Smoker   Smokeless tobacco: Never Used  Substance and Sexual Activity   Alcohol use: Yes    Comment: occ.    Drug use: No   Sexual activity: Not on file  Lifestyle   Physical activity:    Days per week: Not on file    Minutes per session: Not on file   Stress: Not on file  Relationships   Social connections:    Talks on phone: Not on file    Gets together: Not on file    Attends religious service: Not on file    Active member of club or organization: Not on file    Attends meetings of clubs or organizations: Not on file    Relationship status: Not on file    Intimate partner violence:    Fear of current or ex partner: Not on file    Emotionally abused: Not on file    Physically abused: Not on file    Forced sexual activity: Not on file  Other Topics Concern   Not on file  Social History Narrative   Not on file    FAMILY HISTORY:  Family History  Problem Relation Age of Onset   Rectal Wright Neg Hx     CURRENT MEDICATIONS:  Outpatient Encounter Medications as of 11/19/2018  Medication Sig   ALPRAZolam (XANAX) 1 MG tablet Take 0.5 tablets (0.5 mg total) by mouth 2 (two) times daily as needed for anxiety.   FLUoxetine (PROZAC) 20 MG capsule Take 1 capsule (20 mg total) by mouth daily.   oxyCODONE (OXYCONTIN) 40 mg 12 hr tablet Take 1 tablet (40 mg total) by mouth every 8 (eight) hours.   oxyCODONE 30 MG 12 hr tablet Take 1 tablet (30 mg total) by mouth every 12 (twelve) hours.   Oxycodone HCl 10 MG TABS Take 1.5 tablets (15 mg total) by mouth every 3 (three) hours as needed.   prochlorperazine (COMPAZINE) 10 MG tablet Take 1 tablet (10 mg total) by mouth every 6 (six) hours as needed for nausea or vomiting.   fentaNYL (DURAGESIC) 100 MCG/HR Place 1 patch onto the skin every 3 (three) days.   nitrofurantoin, macrocrystal-monohydrate, (MACROBID) 100 MG capsule Take 1 capsule (100 mg total) by mouth 2 (two) times daily.   No facility-administered encounter medications on file as of 11/19/2018.     ALLERGIES:  No Known Allergies   PHYSICAL EXAM:  ECOG Performance status: 1  Blood pressure is 101/58.  Pulse rate is 103.  Respiratory rate is 18.  Temperature is 99.2.  Saturations are 98%.  Physical Exam Constitutional:      Appearance: Normal appearance.  Cardiovascular:     Rate and Rhythm: Normal rate and regular rhythm.     Heart sounds: Normal heart sounds.  Pulmonary:     Effort: Pulmonary effort is normal.     Breath sounds: Normal breath sounds.  Abdominal:     Palpations: Abdomen is soft.  Musculoskeletal:         General: No swelling.  Skin:    General: Skin is warm.  Neurological:     General: No focal deficit present.     Mental Status: He is alert and oriented to person, place, and time.  Psychiatric:  Mood and Affect: Mood normal.        Behavior: Behavior normal.      LABORATORY DATA:  I have reviewed the labs as listed.  CBC    Component Value Date/Time   WBC 8.4 11/18/2018 1159   RBC 3.65 (L) 11/18/2018 1159   HGB 9.6 (L) 11/18/2018 1159   HCT 31.1 (L) 11/18/2018 1159   PLT 331 11/18/2018 1159   MCV 85.2 11/18/2018 1159   MCH 26.3 11/18/2018 1159   MCHC 30.9 11/18/2018 1159   RDW 14.8 11/18/2018 1159   LYMPHSABS 1.4 11/18/2018 1159   MONOABS 1.3 (H) 11/18/2018 1159   EOSABS 0.3 11/18/2018 1159   BASOSABS 0.0 11/18/2018 1159   CMP Latest Ref Rng & Units 11/18/2018 11/04/2018 10/21/2018  Glucose 70 - 99 mg/dL 107(H) 140(H) 141(H)  BUN 6 - 20 mg/dL 18 22(H) 19  Creatinine 0.61 - 1.24 mg/dL 1.34(H) 1.59(H) 1.03  Sodium 135 - 145 mmol/L 136 137 137  Potassium 3.5 - 5.1 mmol/L 3.9 3.6 3.9  Chloride 98 - 111 mmol/L 103 105 103  CO2 22 - 32 mmol/L _0 Calcium 8.9 - 10.3 mg/dL 8.9 8.6(L) 9.0  Total Protein 6.5 - 8.1 g/dL 7.6 7.0 7.2  Total Bilirubin 0.3 - 1.2 mg/dL 0.5 0.3 0.4  Alkaline Phos 38 - 126 U/L 121 132(H) 100  AST 15 - 41 U/L 14(L) 17 18  ALT 0 - 44 U/L _1 DIAGNOSTIC IMAGING:  I have independently reviewed the scans and discussed with the patient.   I have reviewed Venita Lick LPN's note and agree with the documentation.  I personally performed a face-to-face visit, made revisions and my assessment and plan is as follows.    ASSESSMENT & PLAN:   Rectal Wright (Isabella) 1.  Metastatic rectal adenocarcinoma: Foundation 1 testing shows KRAS G13D, NRAS-WT, MS-stable, PIK3CA, TP53 - Stage IIIb (T2N2) rectal adenocarcinoma, treated with radiation 5 Gy over 5 fractions at [facility], FOLFOX 6 cycles from 01/04/2016 through  03/14/2016, status post robotic APR with coloanal anastomosis and diverting loop ileostomy on 06/07/2016. - Post anastomotic dehiscence in October 2017 with pelvic abscess - Rectal biopsy on 03/19/2017 with invasive moderately differentiated adenocarcinoma, rebiopsy in October 2018 with local recurrence at anastomosis associated with an nonhealing pelvic abscess - Chemoradiation therapy with Xeloda from 07/09/2017 through 08/01/2017, 30.6 Gray in 17 fractions at [place] scan on 01/04/2018 shows increased uptake in the presacral collection area.  No evidence of distant metastasis.  - He was seen by Dr. Percell Boston at [facility] clinic.  An MRI was repeated.  A CT-guided biopsy with CT-guided drain placement of the pelvic fluid collection was done on 03/22/2018.  Biopsy consistent with adenocarcinoma. - He was seen by Dr. Suanne Marker at [facility] clinic.  Pelvic exenteration with cystectomy, prostatectomy, resection of rectum/anus/anastomosis, the pelvic inflammation, pelvic sidewall with IORT, urinary reconstruction with ileal conduit and end colostomy (possible ileostomy takedown) and flap reconstruction was done on 06/06/2018.  He had a positive radial margin. - PET CT scan on 09/10/2018 (compared to PET scan from May 2019) showed circumferential hypermetabolism in the pelvic floor surrounding the presacral fluid collection, slightly increased in size compared to the CT scan from 08/01/2018.  Pelvic floor/left hemipelvic fluid collection new since surgery is stable since the last CT in December.  1.5 cm left groin lymph node slightly increased in size from December scan. -CEA was  mildly elevated at 4.8. -Left inguinal lymph node biopsy on 09/18/2018 consistent with metastatic adenocarcinoma, consistent with colorectal primary. - CT fistulogram of the pelvis was done on 10/09/2018 which showed presacral fluid and gas collection, measuring 6.6 cm suspicious for abscess.  Percutaneous  injection of contrast partly opacifies only the extra-anatomic fluid collection in the presacral space, confirming continuity of this possible abscess with the perineum.  Redemonstration of lucency involving the sacrum, adjacent to the fluid collection.  Small lymph nodes in bilateral inguinal region. -He has a virtual consultation with Dr. Rowe Robert on 10/15/2018. 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  He also had major issues with cold sensitivity.  -2 cycles of palliative chemotherapy with FOLFIRI on 10/22/2018 and 11/05/2018. -Perineal discharge after cycle 1 has decreased.  However after cycle 2 he had foul-smelling discharge.  This has become better in the last 3 days. -His urine is also foul-smelling.  I have sent a prescription for Macrobid twice daily for 7 days. - He seems to have tolerated second cycle very well.  He had about 4 days of tiredness. - He may proceed with cycle 3 of FOLFIRI today.  If he is doing well on discharge stops, I plan to introduce Avastin with cycle 4.  2.  Pelvic pain: -He is currently taking OxyContin 40 mg every 8 hours. -He still requiring oxycodone 15 mg every 3 hours for breakthrough pain. -I will discontinue his OxyContin.  I will switch him to fentanyl 100 mcg patch.  He will use oxycodone as needed. -I have counseled him to not use the patch if he gets drowsy.   3.  Anxiety: -He takes Xanax as needed which is helping.      Orders placed this encounter:  No orders of the defined types were placed in this encounter.     Jonathan Jack, MD [place] [phone]

## 2018-11-19 NOTE — Patient Instructions (Signed)
[place] at [facility] Discharge Instructions  You were seen today by Dr. Delton Coombes. He went over your recent lab results. He will send in an antibiotic for you to start taking. He is going to send a pain patch to try for your pain. He will see you back in 2 weeks  for labs, treatment and follow up.   Thank you for choosing [place] at [facility] to provide your oncology and hematology care.  To afford each patient quality time with our provider, please arrive at least 15 minutes before your scheduled appointment time.   If you have a lab appointment with the [place] please come in thru the  Main Entrance and check in at the main information desk  You need to re-schedule your appointment should you arrive 10 or more minutes late.  We strive to give you quality time with our providers, and arriving late affects you and other patients whose appointments are after yours.  Also, if you no show three or more times for appointments you may be dismissed from the clinic at the providers discretion.     Again, thank you for choosing [facility].  Our hope is that these requests will decrease the amount of time that you wait before being seen by our physicians.       _____________________________________________________________  Should you have questions after your visit to [facility], please contact our office at [phone] between the hours of 8:00 a.m. and 4:30 p.m.  Voicemails left after 4:00 p.m. will not be returned until the following business day.  For prescription refill requests, have your pharmacy contact our office and allow 72 hours.    Cancer Center Support Programs:   > Cancer Support Group  2nd Tuesday of the month 1pm-2pm, Journey Room

## 2018-11-20 ENCOUNTER — Other Ambulatory Visit: Payer: Self-pay

## 2018-11-21 ENCOUNTER — Encounter (HOSPITAL_COMMUNITY): Payer: Self-pay

## 2018-11-21 ENCOUNTER — Inpatient Hospital Stay (HOSPITAL_COMMUNITY): Payer: BLUE CROSS/BLUE SHIELD | Attending: Hematology

## 2018-11-21 VITALS — BP 100/66 | HR 90 | Temp 98.4°F | Resp 16

## 2018-11-21 DIAGNOSIS — C20 Malignant neoplasm of rectum: Secondary | ICD-10-CM | POA: Diagnosis not present

## 2018-11-21 MED ORDER — HEPARIN SOD (PORK) LOCK FLUSH 100 UNIT/ML IV SOLN: 500.0000 [IU] | Freq: Once | INTRAVENOUS | Status: DC | PRN

## 2018-11-21 MED ORDER — HEPARIN SOD (PORK) LOCK FLUSH 100 UNIT/ML IV SOLN
250.0000 [IU] | Freq: Once | INTRAVENOUS | Status: AC | PRN
  Administered 2018-11-21: 250 [IU]

## 2018-11-21 MED ORDER — SODIUM CHLORIDE 0.9% FLUSH: 10.0000 mL | INTRAVENOUS | Status: DC | PRN

## 2018-11-21 MED ORDER — SODIUM CHLORIDE 0.9% FLUSH
3.0000 mL | INTRAVENOUS | Status: DC | PRN
  Administered 2018-11-21: 3 mL
  Filled 2018-11-21: qty 10

## 2018-11-21 NOTE — Patient Instructions (Signed)
[place] at [facility] Discharge Instructions  5FU pump discontinued with PICC line flushed per protocol. Follow-up as scheduled. Call clinic for any questions or concerns   Thank you for choosing [place] at [facility] to provide your oncology and hematology care.  To afford each patient quality time with our provider, please arrive at least 15 minutes before your scheduled appointment time.   If you have a lab appointment with the [place] please come in thru the  Main Entrance and check in at the main information desk  You need to re-schedule your appointment should you arrive 10 or more minutes late.  We strive to give you quality time with our providers, and arriving late affects you and other patients whose appointments are after yours.  Also, if you no show three or more times for appointments you may be dismissed from the clinic at the providers discretion.     Again, thank you for choosing [facility].  Our hope is that these requests will decrease the amount of time that you wait before being seen by our physicians.       _____________________________________________________________  Should you have questions after your visit to [facility], please contact our office at [phone] between the hours of 8:00 a.m. and 4:30 p.m.  Voicemails left after 4:00 p.m. will not be returned until the following business day.  For prescription refill requests, have your pharmacy contact our office and allow 72 hours.    Cancer Center Support Programs:   > Cancer Support Group  2nd Tuesday of the month 1pm-2pm, Journey Room

## 2018-11-21 NOTE — Progress Notes (Signed)
Jonathan Wright tolerated 5FU pump well without complaints or incident. 5FU pump discontinued with PICC line flushed per protocol. VSS Pt discharged self ambulatory in satisfactory condition

## 2018-11-26 ENCOUNTER — Other Ambulatory Visit (HOSPITAL_COMMUNITY): Payer: Self-pay | Admitting: *Deleted

## 2018-11-26 ENCOUNTER — Other Ambulatory Visit (HOSPITAL_COMMUNITY): Payer: Self-pay | Admitting: Hematology

## 2018-11-26 DIAGNOSIS — K6289 Other specified diseases of anus and rectum: Secondary | ICD-10-CM

## 2018-11-26 DIAGNOSIS — C20 Malignant neoplasm of rectum: Secondary | ICD-10-CM

## 2018-11-26 MED ORDER — OXYCODONE HCL 10 MG PO TABS: 15.0000 mg | ORAL_TABLET | ORAL | 0 refills | Status: DC | PRN

## 2018-12-02 ENCOUNTER — Other Ambulatory Visit (HOSPITAL_COMMUNITY): Payer: Self-pay

## 2018-12-03 ENCOUNTER — Ambulatory Visit (HOSPITAL_COMMUNITY): Payer: Self-pay | Admitting: Hematology

## 2018-12-03 ENCOUNTER — Ambulatory Visit (HOSPITAL_COMMUNITY): Payer: Self-pay

## 2018-12-03 ENCOUNTER — Other Ambulatory Visit (HOSPITAL_COMMUNITY): Payer: Self-pay

## 2018-12-04 ENCOUNTER — Other Ambulatory Visit (HOSPITAL_COMMUNITY): Payer: Self-pay | Admitting: *Deleted

## 2018-12-04 ENCOUNTER — Other Ambulatory Visit (HOSPITAL_COMMUNITY): Payer: Self-pay | Admitting: Hematology

## 2018-12-04 DIAGNOSIS — K6289 Other specified diseases of anus and rectum: Secondary | ICD-10-CM

## 2018-12-04 DIAGNOSIS — C20 Malignant neoplasm of rectum: Secondary | ICD-10-CM

## 2018-12-04 MED ORDER — OXYCODONE HCL ER 60 MG PO T12A: 60.0000 mg | EXTENDED_RELEASE_TABLET | Freq: Three times a day (TID) | ORAL | 0 refills | Status: DC

## 2018-12-04 MED ORDER — ONDANSETRON 4 MG PO TBDP: 4.0000 mg | ORAL_TABLET | Freq: Three times a day (TID) | ORAL | 1 refills | Status: DC | PRN

## 2018-12-04 NOTE — Progress Notes (Signed)
Patient's SO called clinic today c/o foul smelling drainage that is dark in color draining from his fistula.  It is a constant drainage and it requiring frequent clothes and bandage changes.  Patient has intense uncontrolled pain that radiates into his pelvic area and up his back.  Patient is stating that since starting the patch 2 weeks ago it has not helped control his pain and has increased his nausea.    I have relayed the above to Dr. Delton Coombes, orders placed for STAT CT scan of pelvis with contrast, Zofran ODT for the nausea to be alternated with compazine as needed and he has called in pain medication for him.    I have talked with patient and advised him of the above and he verbalizes understanding.

## 2018-12-04 NOTE — Progress Notes (Unsigned)
oxycontin

## 2018-12-05 ENCOUNTER — Other Ambulatory Visit (HOSPITAL_COMMUNITY): Payer: Self-pay | Admitting: *Deleted

## 2018-12-05 ENCOUNTER — Encounter (HOSPITAL_COMMUNITY): Payer: Self-pay

## 2018-12-05 ENCOUNTER — Other Ambulatory Visit: Payer: Self-pay

## 2018-12-05 ENCOUNTER — Ambulatory Visit (HOSPITAL_COMMUNITY)
Admission: RE | Admit: 2018-12-05 | Discharge: 2018-12-05 | Disposition: A | Discharge status: Home / Self Care | Payer: BLUE CROSS/BLUE SHIELD | Source: Ambulatory Visit | Attending: Hematology | Admitting: Hematology

## 2018-12-05 DIAGNOSIS — C20 Malignant neoplasm of rectum: Secondary | ICD-10-CM | POA: Diagnosis not present

## 2018-12-05 MED ORDER — HEPARIN SOD (PORK) LOCK FLUSH 100 UNIT/ML IV SOLN
INTRAVENOUS | Status: AC
  Administered 2018-12-05: 500 [IU]
  Filled 2018-12-05: qty 5

## 2018-12-05 MED ORDER — IOHEXOL 300 MG/ML  SOLN
100.0000 mL | Freq: Once | INTRAMUSCULAR | Status: AC | PRN
  Administered 2018-12-05: 12:00:00 100 mL via INTRAVENOUS

## 2018-12-05 NOTE — Progress Notes (Signed)
Orders placed per Dr. Delton Coombes for drain placement by interventional radiology.  Patient will be contacted with appointment date and time.

## 2018-12-06 ENCOUNTER — Other Ambulatory Visit: Payer: Self-pay | Admitting: Radiology

## 2018-12-09 ENCOUNTER — Other Ambulatory Visit: Payer: Self-pay | Admitting: Radiology

## 2018-12-09 ENCOUNTER — Ambulatory Visit (HOSPITAL_COMMUNITY)
Admission: RE | Admit: 2018-12-09 | Discharge: 2018-12-09 | Disposition: A | Discharge status: Home / Self Care | Payer: BLUE CROSS/BLUE SHIELD | Source: Ambulatory Visit | Attending: Hematology | Admitting: Hematology

## 2018-12-09 ENCOUNTER — Inpatient Hospital Stay (HOSPITAL_COMMUNITY): Admission: RE | Admit: 2018-12-09 | Payer: Self-pay | Source: Ambulatory Visit

## 2018-12-09 ENCOUNTER — Encounter (HOSPITAL_COMMUNITY): Payer: Self-pay

## 2018-12-09 ENCOUNTER — Other Ambulatory Visit: Payer: Self-pay

## 2018-12-09 ENCOUNTER — Other Ambulatory Visit (HOSPITAL_COMMUNITY): Payer: Self-pay

## 2018-12-09 VITALS — BP 102/52 | HR 100 | Temp 99.1°F | Resp 15 | Ht 69.0 in | Wt 160.0 lb

## 2018-12-09 DIAGNOSIS — K611 Rectal abscess: Secondary | ICD-10-CM | POA: Diagnosis not present

## 2018-12-09 DIAGNOSIS — K651 Peritoneal abscess: Secondary | ICD-10-CM | POA: Diagnosis not present

## 2018-12-09 DIAGNOSIS — Z79899 Other long term (current) drug therapy: Secondary | ICD-10-CM | POA: Diagnosis not present

## 2018-12-09 DIAGNOSIS — F419 Anxiety disorder, unspecified: Secondary | ICD-10-CM | POA: Insufficient documentation

## 2018-12-09 DIAGNOSIS — F329 Major depressive disorder, single episode, unspecified: Secondary | ICD-10-CM | POA: Insufficient documentation

## 2018-12-09 DIAGNOSIS — C20 Malignant neoplasm of rectum: Secondary | ICD-10-CM | POA: Insufficient documentation

## 2018-12-09 LAB — CBC
HCT: 32.1 % — ABNORMAL LOW (ref 39.0–52.0)
Hemoglobin: 9.8 g/dL — ABNORMAL LOW (ref 13.0–17.0)
MCH: 26.1 pg (ref 26.0–34.0)
MCHC: 30.5 g/dL (ref 30.0–36.0)
MCV: 85.4 fL (ref 80.0–100.0)
Platelets: 479 10*3/uL — ABNORMAL HIGH (ref 150–400)
RBC: 3.76 MIL/uL — ABNORMAL LOW (ref 4.22–5.81)
RDW: 16.7 % — ABNORMAL HIGH (ref 11.5–15.5)
WBC: 14.5 10*3/uL — ABNORMAL HIGH (ref 4.0–10.5)
nRBC: 0 % (ref 0.0–0.2)

## 2018-12-09 LAB — BASIC METABOLIC PANEL
Anion gap: 10 (ref 5–15)
BUN: 19 mg/dL (ref 6–20)
CO2: 23 mmol/L (ref 22–32)
Calcium: 8.9 mg/dL (ref 8.9–10.3)
Chloride: 101 mmol/L (ref 98–111)
Creatinine, Ser: 1.86 mg/dL — ABNORMAL HIGH (ref 0.61–1.24)
GFR calc Af Amer: 47 mL/min — ABNORMAL LOW (ref >60)
GFR calc non Af Amer: 41 mL/min — ABNORMAL LOW (ref >60)
Glucose, Bld: 100 mg/dL — ABNORMAL HIGH (ref 70–99)
Potassium: 4.6 mmol/L (ref 3.5–5.1)
Sodium: 134 mmol/L — ABNORMAL LOW (ref 135–145)

## 2018-12-09 LAB — PROTIME-INR
INR: 1.1 (ref 0.8–1.2)
Prothrombin Time: 14.4 seconds (ref 11.4–15.2)

## 2018-12-09 MED ORDER — PIPERACILLIN-TAZOBACTAM 3.375 G IVPB
3.3750 g | Freq: Once | INTRAVENOUS | Status: AC
  Administered 2018-12-09: 3.375 g via INTRAVENOUS
  Filled 2018-12-09: qty 50

## 2018-12-09 MED ORDER — SODIUM CHLORIDE 0.9% FLUSH: 5.0000 mL | Freq: Three times a day (TID) | INTRAVENOUS | Status: DC

## 2018-12-09 MED ORDER — HYDROCODONE-ACETAMINOPHEN 5-325 MG PO TABS
1.0000 | ORAL_TABLET | ORAL | Status: DC | PRN
  Administered 2018-12-09: 2 via ORAL
  Filled 2018-12-09 (×2): qty 2

## 2018-12-09 MED ORDER — MIDAZOLAM HCL 2 MG/2ML IJ SOLN
INTRAMUSCULAR | Status: AC | PRN
  Administered 2018-12-09 (×2): 1 mg via INTRAVENOUS

## 2018-12-09 MED ORDER — FENTANYL CITRATE (PF) 100 MCG/2ML IJ SOLN
INTRAMUSCULAR | Status: AC
  Filled 2018-12-09: qty 2

## 2018-12-09 MED ORDER — SODIUM CHLORIDE 0.9 % IV SOLN
INTRAVENOUS | Status: AC | PRN
  Administered 2018-12-09: 10 mL/h via INTRAVENOUS

## 2018-12-09 MED ORDER — MIDAZOLAM HCL 2 MG/2ML IJ SOLN
INTRAMUSCULAR | Status: AC
  Filled 2018-12-09: qty 2

## 2018-12-09 MED ORDER — SODIUM CHLORIDE 0.9 % IV SOLN: INTRAVENOUS | Status: DC

## 2018-12-09 MED ORDER — FENTANYL CITRATE (PF) 100 MCG/2ML IJ SOLN
INTRAMUSCULAR | Status: AC | PRN
  Administered 2018-12-09 (×3): 50 ug via INTRAVENOUS

## 2018-12-09 NOTE — Consult Note (Signed)
Chief Complaint: Patient was seen in consultation today for CT guided drainage of presacral fluid collection/abscess  Referring Physician(s): Katragadda,Sreedhar  Supervising Physician: Markus Daft  Patient Status: [facility] - Out-pt  History of Present Illness: Jonathan Wright is a 53 y.o. male with hx metastatic rectal adenocarcinoma with prior chemoradiation, s/p robotic APR with coloanal anastomosis and diverting loop ileostomy on 06/07/2016; s/p anastomotic dehiscence in October 2017 with pelvic abscess, prior pelvic drain placement at outside facility on 03/22/18, s/p pelvic exenteration with cystectomy, prostatectomy, resection of rectum/anus/anastomosis, the pelvic inflammation, pelvic sidewall with IORT, urinary reconstruction with ileal conduit and end colostomy (possible ileostomy takedown) and flap reconstruction was done on 06/06/2018. He now presents with rectal/low back pain and draining fistula and CT demonstrating rim enhancing fluid collection in presacral space concerning for abscess. He is scheduled today for CT guided drainage of this fluid collection. WBC 14.5.  Past Medical History:  Diagnosis Date   Anxiety    Depression    Rectal cancer ([place]) 12/17/2015    Past Surgical History:  Procedure Laterality Date   ABDOMINAL PERINEAL BOWEL RESECTION  06/07/2016   [facility]   COLON SURGERY     DIVERTING ILEOSTOMY  06/07/2016   [facility]   INGUINAL LYMPH NODE BIOPSY Left 09/18/2018   Procedure: LEFT INGUINAL LYMPH NODE BIOPSY;  Surgeon: Aviva Signs, MD;  Location: [facility];  Service: General;  Laterality: Left;   LAPAROSCOPIC LOW ANTERIOR RESCECTION WITH COLOANAL ANASTOMOSIS  06/07/2016   [facility]   PERIPHERALLY Center CATHETER INSERTION  11/2015    Allergies: Patient has no known allergies.  Medications: Prior to Admission medications   Medication Sig Start Date End Date Taking? Authorizing Provider  ALPRAZolam Duanne Moron) 1 MG tablet  Take 0.5 tablets (0.5 mg total) by mouth 2 (two) times daily as needed for anxiety. 09/11/18  Yes Lockamy, Randi L, NP-C  FLUoxetine (PROZAC) 20 MG capsule Take 1 capsule (20 mg total) by mouth daily. 10/09/18  Yes Lockamy, Randi L, NP-C  ondansetron (ZOFRAN ODT) 4 MG disintegrating tablet Take 1 tablet (4 mg total) by mouth every 8 (eight) hours as needed for nausea or vomiting. 12/04/18  Yes Derek Jack, MD  oxyCODONE (OXYCONTIN) 60 MG 12 hr tablet Take 60 mg by mouth every 8 (eight) hours. 12/04/18  Yes Derek Jack, MD  Oxycodone HCl 10 MG TABS Take 1.5 tablets (15 mg total) by mouth every 3 (three) hours as needed. 11/26/18  Yes Derek Jack, MD  prochlorperazine (COMPAZINE) 10 MG tablet Take 1 tablet (10 mg total) by mouth every 6 (six) hours as needed for nausea or vomiting. 11/05/18  Yes Derek Jack, MD     Family History  Problem Relation Age of Onset   Rectal cancer Neg Hx     Social History   Socioeconomic History   Marital status: Divorced    Spouse name: Not on file   Number of children: Not on file   Years of education: Not on file   Highest education level: Not on file  Occupational History   Not on file  Social Needs   Financial resource strain: Not on file   Food insecurity:    Worry: Not on file    Inability: Not on file   Transportation needs:    Medical: Not on file    Non-medical: Not on file  Tobacco Use   Smoking status: Never Smoker   Smokeless tobacco: Never Used  Substance and Sexual Activity   Alcohol  use: Yes    Comment: occ.    Drug use: No   Sexual activity: Not on file  Lifestyle   Physical activity:    Days per week: Not on file    Minutes per session: Not on file   Stress: Not on file  Relationships   Social connections:    Talks on phone: Not on file    Gets together: Not on file    Attends religious service: Not on file    Active member of club or organization: Not on file    Attends  meetings of clubs or organizations: Not on file    Relationship status: Not on file  Other Topics Concern   Not on file  Social History Narrative   Not on file      Review of Systems denies fever,HA,CP,dyspnea, cough, abd pain,N/V or bleeding  Vital Signs: BP 103/69    Pulse (!) 110    Temp 99.5 F (37.5 C)    Ht 5\' 9"  (1.753 m)    Wt 160 lb (72.6 kg)    SpO2 100%    BMI 23.63 kg/m   Physical Exam awake/alert; chest- CTA bilat; heart- sl tachy but regular rhythm; abd- soft,+BS, NT; ostomy bags in place left/right abd ; no LE edema  Imaging: Ct Pelvis W Contrast  Result Date: 12/05/2018 CLINICAL DATA:  Rectal pain with draining fistula. Personal history of rectal carcinoma. EXAM: CT PELVIS WITH CONTRAST TECHNIQUE: Multidetector CT imaging of the pelvis was performed using the standard protocol following the bolus administration of intravenous contrast. CONTRAST:  167mL OMNIPAQUE IOHEXOL 300 MG/ML  SOLN COMPARISON:  10/09/2018 and 08/01/2018 FINDINGS: Urinary Tract: Postop changes again seen from pelvic exenteration. Bowel: Stable postop changes from pelvic exenteration. Left lower quadrant colostomy noted. Vascular/Lymphatic: No pathologically enlarged lymph nodes identified. Reproductive:  Stable postop changes from pelvic exenteration. Other: A rim enhancing fluid collection is again seen in the presacral space which contains a few tiny air bubbles. This measures 7.1 x 3.7 cm, without significant change since previous study. Surrounding soft tissue density is seen, with asymmetric soft tissue prominence along the left lateral wall of this fluid collection as well as the left pelvic sidewall. This measures approximately 5.8 x 3.4 cm on image 32/3, compared to 5.3 x 2.4 cm on previous study of 08/01/2018. This is suspicious for residual or recurrent tumor. Musculoskeletal: No significant abnormality identified. IMPRESSION: 1. Stable rim enhancing fluid collection in presacral space measuring  7.1 x 3.7 cm, with several tiny air bubbles. Abscess cannot be excluded. 2. Increased asymmetric soft tissue prominence along the left lateral margin of this collection and left pelvic sidewall, suspicious for residual or recurrent tumor. Electronically Signed   By: Earle Gell M.D.   On: 12/05/2018 13:59    Labs:  CBC: Recent Labs    10/21/18 1104 11/04/18 1434 11/18/18 1159 12/09/18 1045  WBC 4.7 5.5 8.4 14.5*  HGB 10.1* 9.4* 9.6* 9.8*  HCT 32.9* 30.3* 31.1* 32.1*  PLT 293 296 331 479*    COAGS: Recent Labs    12/09/18 1045  INR 1.1    BMP: Recent Labs    10/21/18 1104 11/04/18 1434 11/18/18 1159 12/09/18 1045  NA 137 137 136 134*  K 3.9 3.6 3.9 4.6  CL 103 105 103 101  CO2 24 22 24 23   GLUCOSE 141* 140* 107* 100*  BUN 19 22* 18 19  CALCIUM 9.0 8.6* 8.9 8.9  CREATININE 1.03 1.59* 1.34* 1.86*  GFRNONAA >  60 50* >60 41*  GFRAA >60 57* >60 47*    LIVER FUNCTION TESTS: Recent Labs    08/06/18 1043 10/21/18 1104 11/04/18 1434 11/18/18 1159  BILITOT 0.1* 0.4 0.3 0.5  AST 19 18 17  14*  ALT 18 12 16 12   ALKPHOS 93 100 132* 121  PROT 7.2 7.2 7.0 7.6  ALBUMIN 3.4* 3.3* 3.4* 3.2*    TUMOR MARKERS: No results for input(s): AFPTM, CEA, CA199, CHROMGRNA in the last 8760 hours.  Assessment and Plan: 54 y.o. male with hx metastatic rectal adenocarcinoma with prior chemoradiation, s/p robotic APR with coloanal anastomosis and diverting loop ileostomy on 06/07/2016; s/p anastomotic dehiscence in October 2017 with pelvic abscess, prior pelvic drain placement at outside facility on 03/22/18, s/p pelvic exenteration with cystectomy, prostatectomy, resection of rectum/anus/anastomosis, the pelvic inflammation, pelvic sidewall with IORT, urinary reconstruction with ileal conduit and end colostomy (possible ileostomy takedown) and flap reconstruction was done on 06/06/2018. He now presents with rectal/low back pain and draining fistula and CT demonstrating rim enhancing fluid  collection in presacral space concerning for abscess. He is scheduled today for CT guided drainage of this fluid collection. WBC 14.5. Creat 1.86.  Imaging studies were reviewed by Dr. Anselm Pancoast. Risks and benefits discussed with the patient including bleeding, infection, damage to adjacent structures, bowel perforation/fistula connection, and sepsis.  All of the patient's questions were answered, patient is agreeable to proceed. Consent signed and in chart.      Thank you for this interesting consult.  I greatly enjoyed meeting Jonathan Wright and look forward to participating in their care.  A copy of this report was sent to the requesting provider on this date.  Electronically Signed: D. Rowe Robert, PA-C 12/09/2018, 11:53 AM   I spent a total of 25 minutes  in face to face in clinical consultation, greater than 50% of which was counseling/coordinating care for CT guided drainage of presacral fluid collection

## 2018-12-09 NOTE — Progress Notes (Signed)
   Pt is to DC with drain in a few hrs He is well versed in drain care and flushes per pt He "has had many"  I have spoken to pt Rx 10 cc sterile saline flushes #60 Flush 2x/day   I have sent order for drain clinic follow up 2 weeks Ct Pelvis with Cx and Drain injection per Dr Anselm Pancoast  Pt is aware and agreeable  Do not get drain wet; do not submerge

## 2018-12-09 NOTE — Discharge Instructions (Signed)
Percutaneous Abscess Drain, Care After  This sheet gives you information about how to care for yourself after your procedure. Your health care provider may also give you more specific instructions. If you have problems or questions, contact your health care provider.  What can I expect after the procedure?  After your procedure, it is common to have:  · A small amount of bruising and discomfort in the area where the drainage tube (catheter) was placed.  · Sleepiness and fatigue. This should go away after the medicines you were given have worn off.  Follow these instructions at home:  Incision care  · Follow instructions from your health care provider about how to take care of your incision. Make sure you:  ? Wash your hands with soap and water before you change your bandage (dressing). If soap and water are not available, use hand sanitizer.  ? Change your dressing as told by your health care provider.  ? Leave stitches (sutures), skin glue, or adhesive strips in place. These skin closures may need to stay in place for 2 weeks or longer. If adhesive strip edges start to loosen and curl up, you may trim the loose edges. Do not remove adhesive strips completely unless your health care provider tells you to do that.  · Check your incision area every day for signs of infection. Check for:  ? More redness, swelling, or pain.  ? More fluid or blood.  ? Warmth.  ? Pus or a bad smell.  ? Fluid leaking from around your catheter (instead of fluid draining through your catheter).  Catheter care    · Follow instructions from your health care provider about emptying and cleaning your catheter and collection bag. You may need to clean the catheter every day so it does not clog.  · If directed, write down the following information every time you empty your bag:  ? The date and time.  ? The amount of drainage.  General instructions  · Rest at home for 1-2 days after your procedure. Return to your normal activities as told by your  health care provider.  · Do not take baths, swim, or use a hot tub for 24 hours after your procedure, or until your health care provider says that this is okay.  · Take over-the-counter and prescription medicines only as told by your health care provider.  · Keep all follow-up visits as told by your health care provider. This is important.  Contact a health care provider if:  · You have less than 10 mL of drainage a day for 2-3 days in a row, or as directed by your health care provider.  · You have more redness, swelling, or pain around your incision area.  · You have more fluid or blood coming from your incision area.  · Your incision area feels warm to the touch.  · You have pus or a bad smell coming from your incision area.  · You have fluid leaking from around your catheter (instead of through your catheter).  · You have a fever or chills.  · You have pain that does not get better with medicine.  Get help right away if:  · Your catheter comes out.  · You suddenly stop having drainage from your catheter.  · You suddenly have blood in the fluid that is draining from your catheter.  · You become dizzy or you faint.  · You develop a rash.  · You have nausea or vomiting.  ·   You have difficulty breathing or you feel short of breath.  · You develop chest pain.  · You have problems with your speech or vision.  · You have trouble balancing or moving your arms or legs.  Summary  · It is common to have a small amount of bruising and discomfort in the area where the drainage tube (catheter) was placed.  · You may be directed to record the amount of drainage from the bag every time you empty it.  · Follow instructions from your health care provider about emptying and cleaning your catheter and collection bag.  This information is not intended to replace advice given to you by your health care provider. Make sure you discuss any questions you have with your health care provider.  Document Released: 12/22/2013 Document  Revised: 06/29/2016 Document Reviewed: 06/29/2016  Elsevier Interactive Patient Education © 2019 [employer]

## 2018-12-09 NOTE — Procedures (Signed)
Interventional Radiology Procedure:   Indications: Recurrent pelvic abscess, history of rectal cancer  Procedure: CT guided drain placement  Findings: 12 fr drain placed and 30 ml of yellow foul smelling fluid removed.  Complications: None     EBL: Less than 10 ml  Plan: Plan to send home with drain today.  Would like patient to flush drain 1-2 days per day.  Plan for follow up CT in 2 weeks with possible drain injection.     Jonathan Benito R. Anselm Pancoast, MD  Pager: [phone]

## 2018-12-09 NOTE — Progress Notes (Signed)
Spoke with daughter Herschel [place] and gave d/c instructions via telephone due to [facility] restrictions.  No questions asked and Michaela verbalized understanding of instructions.

## 2018-12-10 ENCOUNTER — Other Ambulatory Visit: Payer: Self-pay | Admitting: Hematology

## 2018-12-10 DIAGNOSIS — K611 Rectal abscess: Secondary | ICD-10-CM

## 2018-12-11 LAB — AEROBIC/ANAEROBIC CULTURE W GRAM STAIN (SURGICAL/DEEP WOUND)

## 2018-12-12 ENCOUNTER — Other Ambulatory Visit (HOSPITAL_COMMUNITY): Payer: Self-pay | Admitting: *Deleted

## 2018-12-12 ENCOUNTER — Other Ambulatory Visit (HOSPITAL_COMMUNITY): Payer: Self-pay | Admitting: Hematology

## 2018-12-12 DIAGNOSIS — K6289 Other specified diseases of anus and rectum: Secondary | ICD-10-CM

## 2018-12-12 DIAGNOSIS — C20 Malignant neoplasm of rectum: Secondary | ICD-10-CM

## 2018-12-12 MED ORDER — METRONIDAZOLE 500 MG PO TABS: 500.0000 mg | ORAL_TABLET | Freq: Three times a day (TID) | ORAL | 0 refills | Status: DC

## 2018-12-13 MED ORDER — OXYCODONE HCL 10 MG PO TABS: 15.0000 mg | ORAL_TABLET | ORAL | 0 refills | Status: DC | PRN

## 2018-12-18 ENCOUNTER — Other Ambulatory Visit (HOSPITAL_COMMUNITY): Payer: Self-pay | Admitting: *Deleted

## 2018-12-18 DIAGNOSIS — K6289 Other specified diseases of anus and rectum: Secondary | ICD-10-CM

## 2018-12-18 MED ORDER — FENTANYL 75 MCG/HR TD PT72: 2.0000 | MEDICATED_PATCH | TRANSDERMAL | 0 refills | Status: DC

## 2018-12-25 ENCOUNTER — Ambulatory Visit
Admission: RE | Admit: 2018-12-25 | Discharge: 2018-12-25 | Disposition: A | Discharge status: Home / Self Care | Payer: BLUE CROSS/BLUE SHIELD | Source: Ambulatory Visit | Attending: Radiology | Admitting: Radiology

## 2018-12-25 ENCOUNTER — Encounter: Payer: Self-pay | Admitting: Interventional Radiology

## 2018-12-25 ENCOUNTER — Ambulatory Visit
Admission: RE | Admit: 2018-12-25 | Discharge: 2018-12-25 | Disposition: A | Discharge status: Home / Self Care | Payer: BLUE CROSS/BLUE SHIELD | Source: Ambulatory Visit | Attending: Hematology | Admitting: Hematology

## 2018-12-25 ENCOUNTER — Other Ambulatory Visit: Payer: Self-pay

## 2018-12-25 ENCOUNTER — Other Ambulatory Visit (HOSPITAL_COMMUNITY): Payer: Self-pay | Admitting: Interventional Radiology

## 2018-12-25 DIAGNOSIS — K611 Rectal abscess: Secondary | ICD-10-CM

## 2018-12-25 HISTORY — PX: IR RADIOLOGIST EVAL & MGMT: IMG5224

## 2018-12-25 MED ORDER — IOPAMIDOL (ISOVUE-300) INJECTION 61%
100.0000 mL | Freq: Once | INTRAVENOUS | Status: AC | PRN
  Administered 2018-12-25: 100 mL via INTRAVENOUS

## 2018-12-25 NOTE — Progress Notes (Signed)
Referring Physician(s): Turpin,Pamela  Chief Complaint: The patient is seen in follow up today s/p peri-rectal fluid collection drain placement  History of present illness: Jonathan Wright is a 53 y.o. male with hx metastatic rectal adenocarcinoma with prior chemoradiation, s/p robotic APR with coloanal anastomosis and diverting loop ileostomy on 06/07/2016; s/p anastomotic dehiscence in October 2017 with pelvic abscess, prior pelvic drain placement at outside facility on 03/22/18, s/p pelvic exenteration with cystectomy, prostatectomy, resection of rectum/anus/anastomosis, the pelvic inflammation, pelvic sidewall with IORT, urinary reconstruction with ileal conduit and end colostomy (possible ileostomy takedown) and flap reconstruction was done on 06/06/2018. He recently presented with rectal/low back pain and draining fistula with CT demonstrating rim enhancing fluid collection in presacral space concerning for abscess. He underwent drain placement as an outpatient at [facility] 12/09/18.  He returns to IR drain clinic today for follow-up of his drain.   Patients states he had significant drainage x3 days after drain placement, then drainage abruptly dropped off.  He reports he has been attempting to flush without much success.  The drain was successful in alleviating the increased pressure he was feeling in his pelvis.  He has had multiple drains in the past and is accustomed to the difficulties of drain management given his complex situation. He is otherwise without fever, chills, pain. He continues with persistent cutaneous fistula.   Past Medical History:  Diagnosis Date   Anxiety    Depression    Rectal cancer ([place]) 12/17/2015    Past Surgical History:  Procedure Laterality Date   ABDOMINAL PERINEAL BOWEL RESECTION  06/07/2016   [facility]   COLON SURGERY     DIVERTING ILEOSTOMY  06/07/2016   [facility]   INGUINAL LYMPH NODE BIOPSY Left 09/18/2018   Procedure: LEFT  INGUINAL LYMPH NODE BIOPSY;  Surgeon: Aviva Signs, MD;  Location: [facility];  Service: General;  Laterality: Left;   IR RADIOLOGIST EVAL & MGMT  12/25/2018   LAPAROSCOPIC LOW ANTERIOR RESCECTION WITH COLOANAL ANASTOMOSIS  06/07/2016   [facility]   PERIPHERALLY INSERTED CENTRAL CATHETER INSERTION  11/2015    Allergies: Patient has no known allergies.  Medications: Prior to Admission medications   Medication Sig Start Date End Date Taking? Authorizing Provider  ALPRAZolam Duanne Moron) 1 MG tablet Take 0.5 tablets (0.5 mg total) by mouth 2 (two) times daily as needed for anxiety. 09/11/18   Lockamy, Randi L, NP-C  fentaNYL (DURAGESIC) 75 MCG/HR Place 2 patches onto the skin every 3 (three) days. 12/18/18   Derek Jack, MD  FLUoxetine (PROZAC) 20 MG capsule Take 1 capsule (20 mg total) by mouth daily. 10/09/18   Lockamy, Randi L, NP-C  metroNIDAZOLE (FLAGYL) 500 MG tablet Take 1 tablet (500 mg total) by mouth 3 (three) times daily. 12/12/18   Derek Jack, MD  ondansetron (ZOFRAN ODT) 4 MG disintegrating tablet Take 1 tablet (4 mg total) by mouth every 8 (eight) hours as needed for nausea or vomiting. 12/04/18   Derek Jack, MD  oxyCODONE (OXYCONTIN) 60 MG 12 hr tablet Take 60 mg by mouth every 8 (eight) hours. 12/04/18   Derek Jack, MD  Oxycodone HCl 10 MG TABS Take 1.5 tablets (15 mg total) by mouth every 3 (three) hours as needed. 12/13/18   Derek Jack, MD  prochlorperazine (COMPAZINE) 10 MG tablet Take 1 tablet (10 mg total) by mouth every 6 (six) hours as needed for nausea or vomiting. 11/05/18   Derek Jack, MD     Family History  Problem  Relation Age of Onset   Rectal cancer Neg Hx     Social History   Socioeconomic History   Marital status: Divorced    Spouse name: Not on file   Number of children: Not on file   Years of education: Not on file   Highest education level: Not on file  Occupational History   Not on file  Social  Needs   Financial resource strain: Not on file   Food insecurity:    Worry: Not on file    Inability: Not on file   Transportation needs:    Medical: Not on file    Non-medical: Not on file  Tobacco Use   Smoking status: Never Smoker   Smokeless tobacco: Never Used  Substance and Sexual Activity   Alcohol use: Yes    Comment: occ.    Drug use: No   Sexual activity: Not on file  Lifestyle   Physical activity:    Days per week: Not on file    Minutes per session: Not on file   Stress: Not on file  Relationships   Social connections:    Talks on phone: Not on file    Gets together: Not on file    Attends religious service: Not on file    Active member of club or organization: Not on file    Attends meetings of clubs or organizations: Not on file    Relationship status: Not on file  Other Topics Concern   Not on file  Social History Narrative   Not on file     Vital Signs: BP 98/65    Pulse (!) 108    Temp 97.9 F (36.6 C)    SpO2 100%   Physical Exam  NAD, alert Abdomen/Flank: Right TG drain in place. Small amount of cloudy drainage in tubing and bulb.  Insertion site intact.   Imaging: Ct Pelvis W Contrast  Result Date: 12/25/2018 CLINICAL DATA:  53 year old with history of rectal cancer and complex surgical history including a pelvic exenteration. Chronic presacral fluid collection with a cutaneous fistula. A percutaneous drain was placed on 12/09/2018. patient presents today for follow-up imaging. EXAM: CT PELVIS WITH CONTRAST TECHNIQUE: Multidetector CT imaging of the pelvis was performed using the standard protocol following the bolus administration of intravenous contrast. CONTRAST:  165mL ISOVUE-300 IOPAMIDOL (ISOVUE-300) INJECTION 61% COMPARISON:  Prior CT scan of the abdomen and pelvis 12/05/2018. FINDINGS: Urinary Tract: Visualized portions are unremarkable. History of prior pelvic exenteration with right lower quadrant diverting ileostomy. Bowel: The  residual bowel demonstrates no evidence of obstruction or focal bowel wall thickening. Left lower quadrant end colostomy. Vascular/Lymphatic: No pathologically enlarged lymph nodes. No significant vascular abnormality seen. Reproductive:  The prostate gland appears to be surgically absent. Other: Surgical changes of prior pelvic exenteration with myocutaneous flap reconstruction of the perineum. A percutaneous drainage catheter is present from a right transgluteal approach. The rim enhancing fluid and gas collection remains unchanged at 3.6 x 7.1 cm. Musculoskeletal: No suspicious bone lesions identified. IMPRESSION: 1. The percutaneous drainage catheter remains in unchanged position near the cephalad aspect of the peripherally enhancing fluid and gas collection in the presacral space. Unfortunately, the size and volume of the fluid and gas collection remains unchanged at 7.1 x 3.6 cm. 2. Similar appearance of extensive postoperative changes including pelvic X on array shin and myocutaneous flap reconstruction of the perineum. Electronically Signed   By: Jacqulynn Cadet M.D.   On: 12/25/2018 11:21   Ir Radiologist  Eval & Mgmt  Result Date: 12/25/2018 EXAM: NEW PATIENT OFFICE VISIT CHIEF COMPLAINT: Please see epic node HISTORY OF PRESENT ILLNESS: Please see epic note REVIEW OF SYSTEMS: Please see epic note PHYSICAL EXAMINATION: Please see epic note ASSESSMENT AND PLAN: Please see epic note Electronically Signed   By: Jacqulynn Cadet M.D.   On: 12/25/2018 11:42    Labs:  CBC: Recent Labs    10/21/18 1104 11/04/18 1434 11/18/18 1159 12/09/18 1045  WBC 4.7 5.5 8.4 14.5*  HGB 10.1* 9.4* 9.6* 9.8*  HCT 32.9* 30.3* 31.1* 32.1*  PLT 293 296 331 479*    COAGS: Recent Labs    12/09/18 1045  INR 1.1    BMP: Recent Labs    10/21/18 1104 11/04/18 1434 11/18/18 1159 12/09/18 1045  NA 137 137 136 134*  K 3.9 3.6 3.9 4.6  CL 103 105 103 101  CO2 24 22 24 23   GLUCOSE 141* 140* 107* 100*    BUN 19 22* 18 19  CALCIUM 9.0 8.6* 8.9 8.9  CREATININE 1.03 1.59* 1.34* 1.86*  GFRNONAA >60 50* >60 41*  GFRAA >60 57* >60 47*    LIVER FUNCTION TESTS: Recent Labs    08/06/18 1043 10/21/18 1104 11/04/18 1434 11/18/18 1159  BILITOT 0.1* 0.4 0.3 0.5  AST 19 18 17  14*  ALT 18 12 16 12   ALKPHOS 93 100 132* 121  PROT 7.2 7.2 7.0 7.6  ALBUMIN 3.4* 3.3* 3.4* 3.2*    Assessment: Chronic pre-sacral fluid collection and cutaneous fistula s/p TG drain placement 12/09/18.  Jonathan Wright presents for follow-up of his drain originally placed 12/09/18.  He has undergone several drain placements in the past and is aware he has a complex and chronic collection which has been difficult to completely resolve.  CT imaging reviewed by Dr. Laurence Ferrari and discussed at length with patient.  No injection performed.  Tube is likely clogged and in need of exchange.  Dr. Laurence Ferrari has discussed case with potential management strategies with patient including the fact that due to post-surgical anatomy this collection may require long-term (or even life-long) drainage.   Plan made for drain upsize/exchange with Dr. Laurence Ferrari as soon as possible. Schedulers will call patient with date and time of appointment.  Patient verbalizes understanding.   Signed: Docia Barrier, PA 12/25/2018, 12:14 PM   Please refer to Dr. Laurence Ferrari attestation of this note for management and plan.

## 2018-12-26 ENCOUNTER — Other Ambulatory Visit: Payer: Self-pay | Admitting: Student

## 2018-12-27 ENCOUNTER — Inpatient Hospital Stay (HOSPITAL_COMMUNITY): Admission: RE | Admit: 2018-12-27 | Payer: BLUE CROSS/BLUE SHIELD | Source: Ambulatory Visit

## 2018-12-27 ENCOUNTER — Ambulatory Visit (HOSPITAL_COMMUNITY): Payer: BLUE CROSS/BLUE SHIELD

## 2018-12-30 ENCOUNTER — Other Ambulatory Visit (HOSPITAL_COMMUNITY): Payer: Self-pay | Admitting: *Deleted

## 2018-12-30 ENCOUNTER — Encounter (HOSPITAL_COMMUNITY): Payer: Self-pay | Admitting: *Deleted

## 2018-12-30 DIAGNOSIS — F32A Depression, unspecified: Secondary | ICD-10-CM

## 2018-12-30 DIAGNOSIS — C20 Malignant neoplasm of rectum: Secondary | ICD-10-CM

## 2018-12-30 DIAGNOSIS — F329 Major depressive disorder, single episode, unspecified: Secondary | ICD-10-CM

## 2018-12-30 MED ORDER — FLUOXETINE HCL 40 MG PO CAPS: 40.0000 mg | ORAL_CAPSULE | Freq: Every day | ORAL | 3 refills | Status: DC

## 2018-12-30 NOTE — Progress Notes (Signed)
Patient called clinic stating that he is having some trouble.  He reports having increasing depression.  He is unable to get out of the bed due to his pain and then because of being unable to get up and do his normal activities he is becoming more and more depressed and tearful.  I talked with Dr. Delton Coombes and he has given orders to increase his dose of prozac daily to 40 mg.  Patient aware to double up on what 20 mg he has remaining.    Patient also is complaining of foul smelling urine with sediment and off color.  He states that usually its infected when he has this happen.  I spoke with Dr. Delton Coombes about this as well and orders received for urinalysis and culture.  Patient will call back to schedule the lab appointment.

## 2018-12-30 NOTE — Telephone Encounter (Signed)
Okay per Dr. Delton Coombes to increase dose of prozac.

## 2018-12-31 ENCOUNTER — Other Ambulatory Visit (HOSPITAL_COMMUNITY): Payer: BLUE CROSS/BLUE SHIELD

## 2018-12-31 ENCOUNTER — Other Ambulatory Visit (HOSPITAL_COMMUNITY): Payer: Self-pay | Admitting: *Deleted

## 2018-12-31 DIAGNOSIS — C20 Malignant neoplasm of rectum: Secondary | ICD-10-CM

## 2018-12-31 DIAGNOSIS — K6289 Other specified diseases of anus and rectum: Secondary | ICD-10-CM

## 2018-12-31 MED ORDER — OXYCODONE HCL 10 MG PO TABS: 15.0000 mg | ORAL_TABLET | ORAL | 0 refills | Status: DC | PRN

## 2019-01-01 ENCOUNTER — Other Ambulatory Visit: Payer: Self-pay

## 2019-01-01 ENCOUNTER — Inpatient Hospital Stay (HOSPITAL_COMMUNITY): Payer: BLUE CROSS/BLUE SHIELD | Attending: Hematology

## 2019-01-01 DIAGNOSIS — C20 Malignant neoplasm of rectum: Secondary | ICD-10-CM

## 2019-01-01 LAB — URINALYSIS, ROUTINE W REFLEX MICROSCOPIC
Bilirubin Urine: NEGATIVE
Glucose, UA: NEGATIVE mg/dL
Ketones, ur: NEGATIVE mg/dL
Nitrite: POSITIVE — AB
Protein, ur: 100 mg/dL — AB
Specific Gravity, Urine: 1.015 (ref 1.005–1.030)
pH: 5 (ref 5.0–8.0)

## 2019-01-03 ENCOUNTER — Other Ambulatory Visit (HOSPITAL_COMMUNITY): Payer: Self-pay | Admitting: *Deleted

## 2019-01-03 DIAGNOSIS — K6289 Other specified diseases of anus and rectum: Secondary | ICD-10-CM

## 2019-01-03 MED ORDER — ALPRAZOLAM 1 MG PO TABS: 0.5000 mg | ORAL_TABLET | Freq: Two times a day (BID) | ORAL | 0 refills | Status: DC | PRN

## 2019-01-03 MED ORDER — CIPROFLOXACIN HCL 500 MG PO TABS: 500.0000 mg | ORAL_TABLET | Freq: Two times a day (BID) | ORAL | 0 refills | Status: DC

## 2019-01-03 NOTE — Progress Notes (Signed)
Orders received from Dr. Delton Coombes for antibiotic for recent urinalysis.  Patient aware.

## 2019-01-06 ENCOUNTER — Other Ambulatory Visit (HOSPITAL_COMMUNITY): Payer: Self-pay | Admitting: *Deleted

## 2019-01-06 LAB — URINE CULTURE: Culture: >=100000 — AB

## 2019-01-06 MED ORDER — CIPROFLOXACIN HCL 500 MG PO TABS: 500.0000 mg | ORAL_TABLET | Freq: Two times a day (BID) | ORAL | 0 refills | Status: DC

## 2019-01-06 NOTE — Progress Notes (Signed)
Patient's culture came back and the organisms present are sensitive to ciprofloxacin, patient is already on this regimen for 7 days. Provider gave orders for patient to take antibiotic for 14 days total.  New prescription called in to their pharmacy for an additional 7 days.

## 2019-01-10 ENCOUNTER — Other Ambulatory Visit (HOSPITAL_COMMUNITY): Payer: Self-pay | Admitting: Hematology

## 2019-01-10 DIAGNOSIS — K6289 Other specified diseases of anus and rectum: Secondary | ICD-10-CM

## 2019-01-10 MED ORDER — FENTANYL 75 MCG/HR TD PT72: 2.0000 | MEDICATED_PATCH | TRANSDERMAL | 0 refills | Status: DC

## 2019-01-14 ENCOUNTER — Other Ambulatory Visit (HOSPITAL_COMMUNITY): Payer: Self-pay | Admitting: *Deleted

## 2019-01-14 DIAGNOSIS — K6289 Other specified diseases of anus and rectum: Secondary | ICD-10-CM

## 2019-01-14 MED ORDER — FENTANYL 100 MCG/HR TD PT72: 2.0000 | MEDICATED_PATCH | TRANSDERMAL | 0 refills | Status: DC

## 2019-01-17 ENCOUNTER — Other Ambulatory Visit (HOSPITAL_COMMUNITY): Payer: Self-pay | Admitting: *Deleted

## 2019-01-17 DIAGNOSIS — K6289 Other specified diseases of anus and rectum: Secondary | ICD-10-CM

## 2019-01-17 DIAGNOSIS — C20 Malignant neoplasm of rectum: Secondary | ICD-10-CM

## 2019-01-17 MED ORDER — OXYCODONE HCL 10 MG PO TABS: 15.0000 mg | ORAL_TABLET | ORAL | 0 refills | Status: DC | PRN

## 2019-01-21 ENCOUNTER — Inpatient Hospital Stay (HOSPITAL_COMMUNITY): Payer: BC Managed Care – PPO | Attending: Hematology

## 2019-01-21 ENCOUNTER — Other Ambulatory Visit: Payer: Self-pay

## 2019-01-21 DIAGNOSIS — B952 Enterococcus as the cause of diseases classified elsewhere: Secondary | ICD-10-CM | POA: Insufficient documentation

## 2019-01-21 DIAGNOSIS — C20 Malignant neoplasm of rectum: Secondary | ICD-10-CM | POA: Insufficient documentation

## 2019-01-21 DIAGNOSIS — Z79899 Other long term (current) drug therapy: Secondary | ICD-10-CM | POA: Insufficient documentation

## 2019-01-21 DIAGNOSIS — B964 Proteus (mirabilis) (morganii) as the cause of diseases classified elsewhere: Secondary | ICD-10-CM | POA: Insufficient documentation

## 2019-01-21 DIAGNOSIS — N390 Urinary tract infection, site not specified: Secondary | ICD-10-CM | POA: Insufficient documentation

## 2019-01-21 DIAGNOSIS — G893 Neoplasm related pain (acute) (chronic): Secondary | ICD-10-CM | POA: Insufficient documentation

## 2019-01-21 DIAGNOSIS — F329 Major depressive disorder, single episode, unspecified: Secondary | ICD-10-CM | POA: Diagnosis not present

## 2019-01-21 LAB — COMPREHENSIVE METABOLIC PANEL
ALT: 15 U/L (ref 0–44)
AST: 16 U/L (ref 15–41)
Albumin: 3.3 g/dL — ABNORMAL LOW (ref 3.5–5.0)
Alkaline Phosphatase: 99 U/L (ref 38–126)
Anion gap: 11 (ref 5–15)
BUN: 21 mg/dL — ABNORMAL HIGH (ref 6–20)
CO2: 23 mmol/L (ref 22–32)
Calcium: 8.7 mg/dL — ABNORMAL LOW (ref 8.9–10.3)
Chloride: 100 mmol/L (ref 98–111)
Creatinine, Ser: 1.23 mg/dL (ref 0.61–1.24)
GFR calc Af Amer: >60 mL/min (ref >60)
GFR calc non Af Amer: >60 mL/min (ref >60)
Glucose, Bld: 126 mg/dL — ABNORMAL HIGH (ref 70–99)
Potassium: 4.3 mmol/L (ref 3.5–5.1)
Sodium: 134 mmol/L — ABNORMAL LOW (ref 135–145)
Total Bilirubin: 0.2 mg/dL — ABNORMAL LOW (ref 0.3–1.2)
Total Protein: 7.5 g/dL (ref 6.5–8.1)

## 2019-01-21 LAB — CBC WITH DIFFERENTIAL/PLATELET
Abs Immature Granulocytes: 0.05 10*3/uL (ref 0.00–0.07)
Basophils Absolute: 0.1 10*3/uL (ref 0.0–0.1)
Basophils Relative: 1 %
Eosinophils Absolute: 0.7 10*3/uL — ABNORMAL HIGH (ref 0.0–0.5)
Eosinophils Relative: 8 %
HCT: 34 % — ABNORMAL LOW (ref 39.0–52.0)
Hemoglobin: 10.2 g/dL — ABNORMAL LOW (ref 13.0–17.0)
Immature Granulocytes: 1 %
Lymphocytes Relative: 15 %
Lymphs Abs: 1.4 10*3/uL (ref 0.7–4.0)
MCH: 26.1 pg (ref 26.0–34.0)
MCHC: 30 g/dL (ref 30.0–36.0)
MCV: 87 fL (ref 80.0–100.0)
Monocytes Absolute: 1.2 10*3/uL — ABNORMAL HIGH (ref 0.1–1.0)
Monocytes Relative: 12 %
Neutro Abs: 6.2 10*3/uL (ref 1.7–7.7)
Neutrophils Relative %: 63 %
Platelets: 382 10*3/uL (ref 150–400)
RBC: 3.91 MIL/uL — ABNORMAL LOW (ref 4.22–5.81)
RDW: 18.1 % — ABNORMAL HIGH (ref 11.5–15.5)
WBC: 9.6 10*3/uL (ref 4.0–10.5)
nRBC: 0 % (ref 0.0–0.2)

## 2019-01-21 LAB — MAGNESIUM: Magnesium: 1.9 mg/dL (ref 1.7–2.4)

## 2019-01-22 ENCOUNTER — Inpatient Hospital Stay (HOSPITAL_COMMUNITY): Payer: BC Managed Care – PPO

## 2019-01-22 ENCOUNTER — Inpatient Hospital Stay (HOSPITAL_COMMUNITY): Payer: BC Managed Care – PPO | Admitting: Hematology

## 2019-01-24 ENCOUNTER — Encounter (HOSPITAL_COMMUNITY): Payer: BLUE CROSS/BLUE SHIELD

## 2019-01-27 ENCOUNTER — Inpatient Hospital Stay (HOSPITAL_COMMUNITY): Payer: BC Managed Care – PPO

## 2019-01-27 ENCOUNTER — Other Ambulatory Visit: Payer: Self-pay

## 2019-01-27 DIAGNOSIS — C20 Malignant neoplasm of rectum: Secondary | ICD-10-CM

## 2019-01-27 LAB — COMPREHENSIVE METABOLIC PANEL
ALT: 14 U/L (ref 0–44)
AST: 15 U/L (ref 15–41)
Albumin: 3.3 g/dL — ABNORMAL LOW (ref 3.5–5.0)
Alkaline Phosphatase: 104 U/L (ref 38–126)
Anion gap: 10 (ref 5–15)
BUN: 25 mg/dL — ABNORMAL HIGH (ref 6–20)
CO2: 25 mmol/L (ref 22–32)
Calcium: 9 mg/dL (ref 8.9–10.3)
Chloride: 101 mmol/L (ref 98–111)
Creatinine, Ser: 1.41 mg/dL — ABNORMAL HIGH (ref 0.61–1.24)
GFR calc Af Amer: >60 mL/min (ref >60)
GFR calc non Af Amer: 57 mL/min — ABNORMAL LOW (ref >60)
Glucose, Bld: 132 mg/dL — ABNORMAL HIGH (ref 70–99)
Potassium: 4.2 mmol/L (ref 3.5–5.1)
Sodium: 136 mmol/L (ref 135–145)
Total Bilirubin: 0.1 mg/dL — ABNORMAL LOW (ref 0.3–1.2)
Total Protein: 7.4 g/dL (ref 6.5–8.1)

## 2019-01-27 LAB — CBC WITH DIFFERENTIAL/PLATELET
Abs Immature Granulocytes: 0.06 10*3/uL (ref 0.00–0.07)
Basophils Absolute: 0.1 10*3/uL (ref 0.0–0.1)
Basophils Relative: 1 %
Eosinophils Absolute: 0.8 10*3/uL — ABNORMAL HIGH (ref 0.0–0.5)
Eosinophils Relative: 9 %
HCT: 34.7 % — ABNORMAL LOW (ref 39.0–52.0)
Hemoglobin: 10.4 g/dL — ABNORMAL LOW (ref 13.0–17.0)
Immature Granulocytes: 1 %
Lymphocytes Relative: 13 %
Lymphs Abs: 1.2 10*3/uL (ref 0.7–4.0)
MCH: 25.8 pg — ABNORMAL LOW (ref 26.0–34.0)
MCHC: 30 g/dL (ref 30.0–36.0)
MCV: 86.1 fL (ref 80.0–100.0)
Monocytes Absolute: 0.9 10*3/uL (ref 0.1–1.0)
Monocytes Relative: 10 %
Neutro Abs: 6.1 10*3/uL (ref 1.7–7.7)
Neutrophils Relative %: 66 %
Platelets: 410 10*3/uL — ABNORMAL HIGH (ref 150–400)
RBC: 4.03 MIL/uL — ABNORMAL LOW (ref 4.22–5.81)
RDW: 17.5 % — ABNORMAL HIGH (ref 11.5–15.5)
WBC: 9.1 10*3/uL (ref 4.0–10.5)
nRBC: 0 % (ref 0.0–0.2)

## 2019-01-27 LAB — MAGNESIUM: Magnesium: 1.9 mg/dL (ref 1.7–2.4)

## 2019-01-28 ENCOUNTER — Ambulatory Visit (HOSPITAL_COMMUNITY): Payer: BLUE CROSS/BLUE SHIELD

## 2019-01-28 ENCOUNTER — Ambulatory Visit (HOSPITAL_COMMUNITY): Payer: BLUE CROSS/BLUE SHIELD | Admitting: Hematology

## 2019-01-30 ENCOUNTER — Encounter (HOSPITAL_COMMUNITY): Payer: BLUE CROSS/BLUE SHIELD

## 2019-01-30 ENCOUNTER — Other Ambulatory Visit (HOSPITAL_COMMUNITY): Payer: Self-pay | Admitting: *Deleted

## 2019-01-30 DIAGNOSIS — K6289 Other specified diseases of anus and rectum: Secondary | ICD-10-CM

## 2019-01-30 DIAGNOSIS — C20 Malignant neoplasm of rectum: Secondary | ICD-10-CM

## 2019-01-31 ENCOUNTER — Other Ambulatory Visit (HOSPITAL_COMMUNITY): Payer: Self-pay | Admitting: *Deleted

## 2019-01-31 MED ORDER — OXYCODONE HCL 10 MG PO TABS: 15.0000 mg | ORAL_TABLET | ORAL | 0 refills | Status: DC | PRN

## 2019-01-31 MED ORDER — FENTANYL 100 MCG/HR TD PT72: 2.0000 | MEDICATED_PATCH | TRANSDERMAL | 0 refills | Status: DC

## 2019-02-03 ENCOUNTER — Other Ambulatory Visit: Payer: Self-pay

## 2019-02-03 ENCOUNTER — Inpatient Hospital Stay (HOSPITAL_COMMUNITY): Payer: BC Managed Care – PPO

## 2019-02-03 DIAGNOSIS — C20 Malignant neoplasm of rectum: Secondary | ICD-10-CM | POA: Diagnosis not present

## 2019-02-03 LAB — CBC WITH DIFFERENTIAL/PLATELET
Abs Immature Granulocytes: 0.2 10*3/uL — ABNORMAL HIGH (ref 0.00–0.07)
Basophils Absolute: 0.1 10*3/uL (ref 0.0–0.1)
Basophils Relative: 0 %
Eosinophils Absolute: 0.2 10*3/uL (ref 0.0–0.5)
Eosinophils Relative: 1 %
HCT: 34.3 % — ABNORMAL LOW (ref 39.0–52.0)
Hemoglobin: 10.4 g/dL — ABNORMAL LOW (ref 13.0–17.0)
Immature Granulocytes: 1 %
Lymphocytes Relative: 13 %
Lymphs Abs: 3 10*3/uL (ref 0.7–4.0)
MCH: 25.8 pg — ABNORMAL LOW (ref 26.0–34.0)
MCHC: 30.3 g/dL (ref 30.0–36.0)
MCV: 85.1 fL (ref 80.0–100.0)
Monocytes Absolute: 2.1 10*3/uL — ABNORMAL HIGH (ref 0.1–1.0)
Monocytes Relative: 9 %
Neutro Abs: 17 10*3/uL — ABNORMAL HIGH (ref 1.7–7.7)
Neutrophils Relative %: 76 %
Platelets: 484 10*3/uL — ABNORMAL HIGH (ref 150–400)
RBC: 4.03 MIL/uL — ABNORMAL LOW (ref 4.22–5.81)
RDW: 17.2 % — ABNORMAL HIGH (ref 11.5–15.5)
WBC: 22.6 10*3/uL — ABNORMAL HIGH (ref 4.0–10.5)
nRBC: 0 % (ref 0.0–0.2)

## 2019-02-03 LAB — COMPREHENSIVE METABOLIC PANEL
ALT: 15 U/L (ref 0–44)
AST: 17 U/L (ref 15–41)
Albumin: 3.3 g/dL — ABNORMAL LOW (ref 3.5–5.0)
Alkaline Phosphatase: 110 U/L (ref 38–126)
Anion gap: 14 (ref 5–15)
BUN: 22 mg/dL — ABNORMAL HIGH (ref 6–20)
CO2: 21 mmol/L — ABNORMAL LOW (ref 22–32)
Calcium: 8.9 mg/dL (ref 8.9–10.3)
Chloride: 100 mmol/L (ref 98–111)
Creatinine, Ser: 1.4 mg/dL — ABNORMAL HIGH (ref 0.61–1.24)
GFR calc Af Amer: >60 mL/min (ref >60)
GFR calc non Af Amer: 57 mL/min — ABNORMAL LOW (ref >60)
Glucose, Bld: 141 mg/dL — ABNORMAL HIGH (ref 70–99)
Potassium: 4.1 mmol/L (ref 3.5–5.1)
Sodium: 135 mmol/L (ref 135–145)
Total Bilirubin: 0.4 mg/dL (ref 0.3–1.2)
Total Protein: 8.1 g/dL (ref 6.5–8.1)

## 2019-02-03 LAB — MAGNESIUM: Magnesium: 1.7 mg/dL (ref 1.7–2.4)

## 2019-02-04 ENCOUNTER — Inpatient Hospital Stay (HOSPITAL_COMMUNITY): Payer: BC Managed Care – PPO

## 2019-02-04 ENCOUNTER — Ambulatory Visit (HOSPITAL_COMMUNITY)
Admission: RE | Admit: 2019-02-04 | Discharge: 2019-02-04 | Disposition: A | Discharge status: Home / Self Care | Payer: BC Managed Care – PPO | Source: Ambulatory Visit | Attending: Hematology | Admitting: Hematology

## 2019-02-04 ENCOUNTER — Other Ambulatory Visit (HOSPITAL_COMMUNITY): Payer: Self-pay | Admitting: *Deleted

## 2019-02-04 ENCOUNTER — Encounter (HOSPITAL_COMMUNITY): Payer: Self-pay | Admitting: Hematology

## 2019-02-04 ENCOUNTER — Inpatient Hospital Stay (HOSPITAL_BASED_OUTPATIENT_CLINIC_OR_DEPARTMENT_OTHER): Payer: BC Managed Care – PPO | Admitting: Hematology

## 2019-02-04 ENCOUNTER — Encounter (HOSPITAL_COMMUNITY): Payer: Self-pay

## 2019-02-04 ENCOUNTER — Ambulatory Visit (HOSPITAL_COMMUNITY): Admission: RE | Admit: 2019-02-04 | Payer: BC Managed Care – PPO | Source: Ambulatory Visit

## 2019-02-04 VITALS — BP 115/67 | HR 106 | Temp 98.7°F | Resp 16 | Wt 160.0 lb

## 2019-02-04 DIAGNOSIS — F329 Major depressive disorder, single episode, unspecified: Secondary | ICD-10-CM | POA: Diagnosis not present

## 2019-02-04 DIAGNOSIS — C20 Malignant neoplasm of rectum: Secondary | ICD-10-CM

## 2019-02-04 DIAGNOSIS — Z79899 Other long term (current) drug therapy: Secondary | ICD-10-CM

## 2019-02-04 DIAGNOSIS — B952 Enterococcus as the cause of diseases classified elsewhere: Secondary | ICD-10-CM

## 2019-02-04 DIAGNOSIS — B964 Proteus (mirabilis) (morganii) as the cause of diseases classified elsewhere: Secondary | ICD-10-CM

## 2019-02-04 DIAGNOSIS — N390 Urinary tract infection, site not specified: Secondary | ICD-10-CM

## 2019-02-04 DIAGNOSIS — G893 Neoplasm related pain (acute) (chronic): Secondary | ICD-10-CM | POA: Diagnosis not present

## 2019-02-04 LAB — URINALYSIS, DIPSTICK ONLY
Bilirubin Urine: NEGATIVE
Glucose, UA: NEGATIVE mg/dL
Ketones, ur: NEGATIVE mg/dL
Nitrite: NEGATIVE
Protein, ur: 100 mg/dL — AB
Specific Gravity, Urine: 1.012 (ref 1.005–1.030)
pH: 9 — ABNORMAL HIGH (ref 5.0–8.0)

## 2019-02-04 MED ORDER — IOHEXOL 300 MG/ML  SOLN
100.0000 mL | Freq: Once | INTRAMUSCULAR | Status: AC | PRN
  Administered 2019-02-04: 100 mL via INTRAVENOUS

## 2019-02-04 NOTE — Patient Instructions (Addendum)
[facility] at [facility] Discharge Instructions  You were seen today by Dr. Katragadda. He went over your recent lab results. He will see you back in  for labs and follow up.   Thank you for choosing [facility] at [facility] to provide your oncology and hematology care.  To afford each patient quality time with our provider, please arrive at least 15 minutes before your scheduled appointment time.   If you have a lab appointment with the Cancer Center please come in thru the  Main Entrance and check in at the main information desk  You need to re-schedule your appointment should you arrive 10 or more minutes late.  We strive to give you quality time with our providers, and arriving late affects you and other patients whose appointments are after yours.  Also, if you no show three or more times for appointments you may be dismissed from the clinic at the providers discretion.     Again, thank you for choosing [facility].  Our hope is that these requests will decrease the amount of time that you wait before being seen by our physicians.       _____________________________________________________________  Should you have questions after your visit to [facility], please contact our office at [phone] between the hours of 8:00 a.m. and 4:30 p.m.  Voicemails left after 4:00 p.m. will not be returned until the following business day.  For prescription refill requests, have your pharmacy contact our office and allow 72 hours.    Cancer Center Support Programs:   > Cancer Support Group  2nd Tuesday of the month 1pm-2pm, Journey Room    

## 2019-02-04 NOTE — Assessment & Plan Note (Addendum)
1.  Metastatic rectal adenocarcinoma: Foundation 1 testing shows K-ras G 13 D, NRAS-WT, MS-stable,PIK3CA,TP53 - Stage IIIb (T2N2) rectal adenocarcinoma, treated with radiation at [facility] followed by FOLFOX 6 cycles from 01/04/2016 through 03/14/2016, status post robotic APR with coloanal anastomosis and diverting loop ileostomy on 06/07/2016. - Rectal recurrence on 03/19/2017, chemoradiation therapy with Xeloda from 07/09/2017 through 08/01/2017 at [facility]. - Pelvic exenteration removed by Dr. Gabriel Carina at [facility] clinic with cystectomy, prostatectomy, resection of rectum/anus/anastomosis, pelvic sidewall with IORT, urinary reconstruction with ileal conduit and end colostomy, and flap reconstruction done on 06/06/2018 with positive radial margin. -3 cycles of palliative chemotherapy with FOLFIRI from 10/22/2018 through 11/19/2018. -CT-guided drain placement by IR on 12/09/2018.  He missed several appointments with as following drain placement. - He reports about 70-100 cc of reddish-brown fluid per day from the JP drain. - He denies any fevers or infections.  His urine is foul-smelling. -We have sent for urine cultures.  His white count is elevated at 22.  Hence I have recommended a pelvis CT with contrast to follow-up on the fluid collection in the presacral area. -We have also talked to him about continuation of chemotherapy versus best supportive care in the form of hospice.  He is wanting to proceed with his next treatments. - I have reviewed pelvic CT scan dated 02/04/2019 which shows percutaneous pigtail drainage catheter remains in place with a formed loop identified an abscess cavity of the presacral space.  Despite the presence of this drainage catheter, competent of the rim-enhancing collection of gas and debris in the left pelvis has progressed, measuring 6.2 x 4 cm today compared to 5 x 3.2 cm previously. -I will contact Dr. Charleston Ropes for their input.  2.  Pelvic pain: - He was fentanyl patch 200 mcg every 72 hours. -He is taking oxycodone 15 mg every 3-4 hours, which is controlling his pain.  3.  Urinary infection: -Prior urine culture from 01/01/2019 showed Proteus primary, Morganella, and Enterococcus faecalis and enterococcus faecium. - This was susceptible to ciprofloxacin.  Hence we will start him on Cipro and Flagyl.  We have sent another culture today.

## 2019-02-04 NOTE — Progress Notes (Addendum)
[place], [place] [address]   CLINIC:  Medical Oncology/Hematology  PCP:  Deland Pretty, [place] [address] [phone]   REASON FOR VISIT:  Follow-up for rectal adenocarcinoma   CURRENT THERAPY:FOLFIRI on hold.   BRIEF ONCOLOGIC HISTORY:  Oncology History Overview Note  Stage IIIB rectal adenocarcinoma, mT2N2M0 Treated with 5Gy x 5 at [facility]   Rectal cancer [facility])  09/20/2015 Procedure   colonoscopy with firm rectal mass and 4 cm polyp in proximal rectum, infiltrating non-obstructing large mass within the distal rectum extending towards anal verge   09/20/2015 Miscellaneous   Microsatellite stable.    09/28/2015 Imaging   CT C/A/P no evidence of distant metastatic disease. irregular thickening of the low rectum and anal canal, several enlarged perirectal LN, indeterminate 72m RUL nodule   09/28/2015 Imaging   MRI pelvis assym diffuse circum lobular thickening of rectum, near full wall thickness extension along lower anterior rectum W/O extra serosal extension c/w known adeno, mult. perirectal LN   12/06/2015 - 12/10/2015 Radiation Therapy   [facility] Dr. AAngelina Ok  2500.00 cGy, 500.00 cGy per day in 5 fractions delivered 1x per day to the 96.0% Isodose line Treatment Dates: 12/06/2015 through 12/10/2015.    01/03/2016 Procedure   PICC placed   01/04/2016 - 03/14/2016 Chemotherapy   FOLFOX x 6 cycles   06/07/2016 Definitive Surgery   Robotic assisted APR with coloanal [facility]  06/19/2016 - 06/23/2016 Hospital Admission   Admit date: 06/19/2016 Admission diagnosis: Sepsis Additional comments: Sepsis due to multiple gram-negative active uremia. Likely [facility] following, perirectal abscess draining by itself, white count and temperatures improved. He  had a PICC line from prior to this admission which was line removed on 06/21/2016, vancomycin stopped on 06/22/2016, He tolerated test dose of Rocephin well on 06/22/2016 without any issues, [facility], requested to follow with PCP in 3 days to make sure he is tolerating oral medications well and has continued to defervesce, request PCP to please check final culture and sensitivity results which should be back in 3 days. He will follow with general surgery outpatient as well.   02/15/2017 Imaging   CT CAP- 1. Well-formed presacral abscess is decreased in size but persistent. 2. No evidence of colorectal cancer metastasis in the abdomen pelvis. 3. Loop ileostomy in the RIGHT lower quadrant.   04/05/2017 Imaging   CT CAP-IMPRESSION: 1. Status post loop right-sided ileostomy with sigmoid to anal anastomosis. 2. Decrease in presacral fluid collection, consistent with abscess. New foci of extraluminal gas, suspicious for fistulous communication to the lower sigmoid. 3. No evidence of metastatic disease.   04/20/2017 Imaging   MRI pelvis w/ and w/o contrast IMPRESSION: 1. No appreciable change in chronic complex presacral space abscess, which demonstrates four separate sites of fistulous communication to the sigmoid colon and colo-anal anastomosis, as detailed. No discrete mass or other findings to suggest local tumor recurrence. 2. No evidence of metastatic disease in the pelvis.   10/11/2018 Cancer Staging   Staging form: Colon and Rectum, AJCC 7th Edition - Pathologic: Stage IVB (TX, N1, M1b) - Signed by KDerek Jack MD on 10/11/2018   10/22/2018 -  Chemotherapy   The patient had palonosetron (ALOXI) injection 0.25 mg, 0.25 mg, Intravenous,  Once, 3 of 4 cycles Administration: 0.25 mg (10/22/2018), 0.25 mg (11/05/2018), 0.25 mg (11/19/2018) bevacizumab (AVASTIN) 350 mg in sodium chloride 0.9 % 100 mL chemo infusion, 5  mg/kg = 350 mg, Intravenous,  Once, 0 of 1 cycle irinotecan (CAMPTOSAR) 340 mg in dextrose 5 % 500 mL chemo infusion, 180 mg/m2 = 340 mg, Intravenous,  Once, 3 of 4 cycles Administration: 340 mg (10/22/2018), 340 mg (11/05/2018), 340 mg (11/19/2018) leucovorin 800 mg in dextrose 5 % 250 mL infusion, 756 mg, Intravenous,  Once, 3 of 4 cycles Administration: 800 mg (10/22/2018), 800 mg (11/05/2018), 800 mg (11/19/2018) fluorouracil (ADRUCIL) chemo injection 750 mg, 400 mg/m2 = 750 mg, Intravenous,  Once, 3 of 4 cycles Administration: 750 mg (10/22/2018), 750 mg (11/05/2018), 750 mg (11/19/2018) fluorouracil (ADRUCIL) 4,550 mg in sodium chloride 0.9 % 59 mL chemo infusion, 2,400 mg/m2 = 4,550 mg, Intravenous, 1 Day/Dose, 3 of 4 cycles Administration: 4,550 mg (10/22/2018), 4,550 mg (11/05/2018), 4,550 mg (11/19/2018)  for chemotherapy treatment.       CANCER STAGING: Cancer Staging Rectal cancer [facility]) Staging form: Colon and Rectum, AJCC 7th Edition - Clinical stage from 01/03/2016: Stage IIIB (T2, N2a, M0) - Signed by Baird Cancer, PA-C on 01/03/2016 - Pathologic: Stage IVB (TX, N1, M1b) - Signed by Derek Jack, MD on 10/11/2018    INTERVAL HISTORY:  Jonathan Wright 53 y.o. male returns for follow-up.  His last chemotherapy was on 11/19/2018.  He had a CT-guided drain placement in the pelvis for presacral fluid collection on 12/09/2018.  Initially the drain stopped draining.  When they were about to reschedule for replacement of the drain, it started draining again.  Drains about 70-100 cc of reddish-brown fluid per day.  He also reports urine is foul-smelling.  He finished antibiotics with Cipro and Flagyl about 2 weeks ago.  His pain is well controlled on the current regimen of 200 mcg fentanyl patch every 72 hours.  He is also taking oxycodone 10 mg every 4 hours for breakthrough pain.  His appetite has been good.  Energy levels are 50% and appetite is 75%.  Denies any nausea vomiting or diarrhea.   Denies any fevers or chills in the recent 2 weeks.   REVIEW OF SYSTEMS:  Review of Systems  Constitutional: Positive for fatigue.  All other systems reviewed and are negative.    PAST MEDICAL/SURGICAL HISTORY:  Past Medical History:  Diagnosis Date  . Anxiety   . Depression   . Rectal cancer ([place]) 12/17/2015   Past Surgical History:  Procedure Laterality Date  . ABDOMINAL PERINEAL BOWEL RESECTION  06/07/2016   [facility]  . COLON SURGERY    . DIVERTING ILEOSTOMY  06/07/2016   [facility]  . INGUINAL LYMPH NODE BIOPSY Left 09/18/2018   Procedure: LEFT INGUINAL LYMPH NODE BIOPSY;  Surgeon: Aviva Signs, MD;  Location: [facility];  Service: General;  Laterality: Left;  . IR RADIOLOGIST EVAL & MGMT  12/25/2018  . LAPAROSCOPIC LOW ANTERIOR RESCECTION WITH COLOANAL ANASTOMOSIS  06/07/2016   [facility]  . PERIPHERALLY INSERTED CENTRAL CATHETER INSERTION  11/2015     SOCIAL HISTORY:  Social History   Socioeconomic History  . Marital status: Divorced    Spouse name: Not on file  . Number of children: Not on file  . Years of education: Not on file  . Highest education level: Not on file  Occupational History  . Not on file  Social Needs  . Financial resource strain: Not on file  .  Food insecurity    Worry: Not on file    Inability: Not on file  . Transportation needs    Medical: Not on file    Non-medical: Not on file  Tobacco Use  . Smoking status: Never Smoker  . Smokeless tobacco: Never Used  Substance and Sexual Activity  . Alcohol use: Yes    Comment: occ.   . Drug use: No  . Sexual activity: Not on file  Lifestyle  . Physical activity    Days per week: Not on file    Minutes per session: Not on file  . Stress: Not on file  Relationships  . Social Herbalist on phone: Not on file    Gets together: Not on file    Attends religious service: Not on file    Active member of club or organization: Not on file    Attends meetings of clubs or  organizations: Not on file    Relationship status: Not on file  . Intimate partner violence    Fear of current or ex partner: Not on file    Emotionally abused: Not on file    Physically abused: Not on file    Forced sexual activity: Not on file  Other Topics Concern  . Not on file  Social History Narrative  . Not on file    FAMILY HISTORY:  Family History  Problem Relation Age of Onset  . Rectal cancer Neg Hx     CURRENT MEDICATIONS:  Outpatient Encounter Medications as of 02/04/2019  Medication Sig  . ALPRAZolam (XANAX) 1 MG tablet Take 0.5 tablets (0.5 mg total) by mouth 2 (two) times daily as needed for anxiety.  . fentaNYL (DURAGESIC) 100 MCG/HR Place 2 patches onto the skin every 3 (three) days.  Marland Kitchen FLUoxetine (PROZAC) 40 MG capsule Take 1 capsule (40 mg total) by mouth daily.  . ondansetron (ZOFRAN ODT) 4 MG disintegrating tablet Take 1 tablet (4 mg total) by mouth every 8 (eight) hours as needed for nausea or vomiting.  . Oxycodone HCl 10 MG TABS Take 1.5 tablets (15 mg total) by mouth every 3 (three) hours as needed.  . prochlorperazine (COMPAZINE) 10 MG tablet Take 1 tablet (10 mg total) by mouth every 6 (six) hours as needed for nausea or vomiting.  . ciprofloxacin (CIPRO) 500 MG tablet Take 1 tablet (500 mg total) by mouth 2 (two) times daily. (Patient not taking: Reported on 02/04/2019)  . metroNIDAZOLE (FLAGYL) 500 MG tablet Take 1 tablet (500 mg total) by mouth 3 (three) times daily. (Patient not taking: Reported on 02/04/2019)   No facility-administered encounter medications on file as of 02/04/2019.     ALLERGIES:  No Known Allergies   PHYSICAL EXAM:  ECOG Performance status: 1  Blood pressure is 101/58.  Pulse rate is 103.  Respiratory rate is 18.  Temperature is 99.2.  Saturations are 98%.  Physical Exam Constitutional:      Appearance: Normal appearance.  Cardiovascular:     Rate and Rhythm: Normal rate and regular rhythm.     Heart sounds: Normal heart  sounds.  Pulmonary:     Effort: Pulmonary effort is normal.     Breath sounds: Normal breath sounds.  Abdominal:     Palpations: Abdomen is soft.  Musculoskeletal:        General: No swelling.  Skin:    General: Skin is warm.  Neurological:     General: No focal deficit present.  Mental Status: He is alert and oriented to person, place, and time.  Psychiatric:        Mood and Affect: Mood normal.        Behavior: Behavior normal.    Drain has serosanguineous fluid.  LABORATORY DATA:  I have reviewed the labs as listed.  CBC    Component Value Date/Time   WBC 22.6 (H) 02/03/2019 1301   RBC 4.03 (L) 02/03/2019 1301   HGB 10.4 (L) 02/03/2019 1301   HCT 34.3 (L) 02/03/2019 1301   PLT 484 (H) 02/03/2019 1301   MCV 85.1 02/03/2019 1301   MCH 25.8 (L) 02/03/2019 1301   MCHC 30.3 02/03/2019 1301   RDW 17.2 (H) 02/03/2019 1301   LYMPHSABS 3.0 02/03/2019 1301   MONOABS 2.1 (H) 02/03/2019 1301   EOSABS 0.2 02/03/2019 1301   BASOSABS 0.1 02/03/2019 1301   CMP Latest Ref Rng & Units 02/03/2019 01/27/2019 01/21/2019  Glucose 70 - 99 mg/dL 141(H) 132(H) 126(H)  BUN 6 - 20 mg/dL 22(H) 25(H) 21(H)  Creatinine 0.61 - 1.24 mg/dL 1.40(H) 1.41(H) 1.23  Sodium 135 - 145 mmol/L 135 136 134(L)  Potassium 3.5 - 5.1 mmol/L 4.1 4.2 4.3  Chloride 98 - 111 mmol/L 100 101 100  CO2 22 - 32 mmol/L 21(L) 25 23  Calcium 8.9 - 10.3 mg/dL 8.9 9.0 8.7(L)  Total Protein 6.5 - 8.1 g/dL 8.1 7.4 7.5  Total Bilirubin 0.3 - 1.2 mg/dL 0.4 0.1(L) 0.2(L)  Alkaline Phos 38 - 126 U/L 110 104 99  AST 15 - 41 U/L '17 15 16  '$ ALT 0 - 44 U/L '15 14 15       '$ DIAGNOSTIC IMAGING:  I have independently reviewed the scans and discussed with the patient.   I have reviewed Venita Lick LPN's note and agree with the documentation.  I personally performed a face-to-face visit, made revisions and my assessment and plan is as follows.    ASSESSMENT & PLAN:   Rectal cancer ([place]) 1.  Metastatic rectal  adenocarcinoma: Foundation 1 testing shows K-ras G 13 D, NRAS-WT, MS-stable,PIK3CA,TP53 - Stage IIIb (T2N2) rectal adenocarcinoma, treated with radiation at [facility] followed by FOLFOX 6 cycles from 01/04/2016 through 03/14/2016, status post robotic APR with coloanal anastomosis and diverting loop ileostomy on 06/07/2016. - Rectal recurrence on 03/19/2017, chemoradiation therapy with Xeloda from 07/09/2017 through 08/01/2017 at [facility]. - Pelvic exenteration removed by Dr. Gabriel Carina at [facility] clinic with cystectomy, prostatectomy, resection of rectum/anus/anastomosis, pelvic sidewall with IORT, urinary reconstruction with ileal conduit and end colostomy, and flap reconstruction done on 06/06/2018 with positive radial margin. -3 cycles of palliative chemotherapy with FOLFIRI from 10/22/2018 through 11/19/2018. -CT-guided drain placement by IR on 12/09/2018.  He missed several appointments with as following drain placement. - He reports about 70-100 cc of reddish-brown fluid per day from the JP drain. - He denies any fevers or infections.  His urine is foul-smelling. -We have sent for urine cultures.  His white count is elevated at 22.  Hence I have recommended a pelvis CT with contrast to follow-up on the fluid collection in the presacral area. -We have also talked to him about continuation of chemotherapy versus best supportive care in the form of hospice.  He is wanting to proceed with his next treatments. - I have reviewed pelvic CT scan dated 02/04/2019 which shows percutaneous pigtail drainage catheter remains in place with a formed loop identified an abscess cavity of the presacral space.  Despite the presence of  this drainage catheter, competent of the rim-enhancing collection of gas and debris in the left pelvis has progressed, measuring 6.2 x 4 cm today compared to 5 x 3.2 cm previously. -I will contact Dr. Charleston Ropes for their input.  2. Pelvic pain: - He was  fentanyl patch 200 mcg every 72 hours. -He is taking oxycodone 15 mg every 3-4 hours, which is controlling his pain.  3.  Urinary infection: -Prior urine culture from 01/01/2019 showed Proteus primary, Morganella, and Enterococcus faecalis and enterococcus faecium. - This was susceptible to ciprofloxacin.  Hence we will start him on Cipro and Flagyl.  We have sent another culture today.  Total time spent is 40 minutes with more than 50% of the time spent face-to-face discussing plan and coordination of care.    Orders placed this encounter:  Orders Placed This Encounter  Procedures  . Urine culture  . CT Abdomen Pelvis W Contrast      Derek Jack, MD [place] [phone]

## 2019-02-04 NOTE — Progress Notes (Signed)
No treatment today per MD. Follow up as scheduled.  

## 2019-02-06 ENCOUNTER — Encounter (HOSPITAL_COMMUNITY): Payer: BLUE CROSS/BLUE SHIELD

## 2019-02-07 ENCOUNTER — Other Ambulatory Visit (HOSPITAL_COMMUNITY): Payer: Self-pay | Admitting: Hematology

## 2019-02-07 ENCOUNTER — Encounter (HOSPITAL_COMMUNITY): Payer: Self-pay | Admitting: Lab

## 2019-02-07 MED ORDER — SULFAMETHOXAZOLE-TRIMETHOPRIM 800-160 MG PO TABS: 1.0000 | ORAL_TABLET | Freq: Two times a day (BID) | ORAL | 0 refills | Status: DC

## 2019-02-07 NOTE — Progress Notes (Unsigned)
Referral sent to DR Crisoforo Oxford at [facility] .  appt 6/30 at 1:30

## 2019-02-08 LAB — URINE CULTURE: Culture: >=100000 — AB

## 2019-02-17 ENCOUNTER — Other Ambulatory Visit (HOSPITAL_COMMUNITY): Payer: Self-pay | Admitting: *Deleted

## 2019-02-17 ENCOUNTER — Other Ambulatory Visit (HOSPITAL_COMMUNITY): Payer: BC Managed Care – PPO

## 2019-02-17 MED ORDER — FENTANYL 100 MCG/HR TD PT72: 2.0000 | MEDICATED_PATCH | TRANSDERMAL | 0 refills | Status: DC

## 2019-02-18 ENCOUNTER — Ambulatory Visit (HOSPITAL_COMMUNITY): Payer: BC Managed Care – PPO

## 2019-02-18 ENCOUNTER — Ambulatory Visit (HOSPITAL_COMMUNITY): Payer: BC Managed Care – PPO | Admitting: Hematology

## 2019-02-19 ENCOUNTER — Other Ambulatory Visit (HOSPITAL_COMMUNITY): Payer: Self-pay | Admitting: *Deleted

## 2019-02-19 DIAGNOSIS — K6289 Other specified diseases of anus and rectum: Secondary | ICD-10-CM

## 2019-02-19 DIAGNOSIS — C20 Malignant neoplasm of rectum: Secondary | ICD-10-CM

## 2019-02-19 MED ORDER — OXYCODONE HCL 10 MG PO TABS: 15.0000 mg | ORAL_TABLET | ORAL | 0 refills | Status: DC | PRN

## 2019-02-20 ENCOUNTER — Encounter (HOSPITAL_COMMUNITY): Payer: BC Managed Care – PPO

## 2019-03-03 ENCOUNTER — Other Ambulatory Visit (HOSPITAL_COMMUNITY): Payer: Self-pay | Admitting: *Deleted

## 2019-03-03 MED ORDER — FENTANYL 100 MCG/HR TD PT72: 2.0000 | MEDICATED_PATCH | TRANSDERMAL | 0 refills | Status: DC

## 2019-03-03 MED ORDER — ONDANSETRON 4 MG PO TBDP: 4.0000 mg | ORAL_TABLET | Freq: Three times a day (TID) | ORAL | 2 refills | Status: DC | PRN

## 2019-03-04 ENCOUNTER — Other Ambulatory Visit (HOSPITAL_COMMUNITY): Payer: Self-pay | Admitting: *Deleted

## 2019-03-06 ENCOUNTER — Other Ambulatory Visit (HOSPITAL_COMMUNITY): Payer: Self-pay | Admitting: *Deleted

## 2019-03-06 DIAGNOSIS — K6289 Other specified diseases of anus and rectum: Secondary | ICD-10-CM

## 2019-03-06 DIAGNOSIS — C20 Malignant neoplasm of rectum: Secondary | ICD-10-CM

## 2019-03-06 MED ORDER — OXYCODONE HCL 10 MG PO TABS: 15.0000 mg | ORAL_TABLET | ORAL | 0 refills | Status: DC | PRN

## 2019-03-06 MED ORDER — SULFAMETHOXAZOLE-TRIMETHOPRIM 800-160 MG PO TABS: 1.0000 | ORAL_TABLET | Freq: Two times a day (BID) | ORAL | 0 refills | Status: DC

## 2019-03-14 ENCOUNTER — Other Ambulatory Visit (HOSPITAL_COMMUNITY): Payer: BC Managed Care – PPO

## 2019-03-14 ENCOUNTER — Other Ambulatory Visit (HOSPITAL_COMMUNITY): Payer: Self-pay

## 2019-03-14 DIAGNOSIS — C20 Malignant neoplasm of rectum: Secondary | ICD-10-CM

## 2019-03-17 ENCOUNTER — Inpatient Hospital Stay (HOSPITAL_COMMUNITY): Payer: BC Managed Care – PPO

## 2019-03-17 ENCOUNTER — Inpatient Hospital Stay (HOSPITAL_COMMUNITY): Payer: BC Managed Care – PPO | Attending: Hematology | Admitting: Hematology

## 2019-03-17 ENCOUNTER — Other Ambulatory Visit: Payer: Self-pay

## 2019-03-17 ENCOUNTER — Encounter (HOSPITAL_COMMUNITY): Payer: Self-pay | Admitting: Hematology

## 2019-03-17 VITALS — BP 109/71 | HR 116 | Temp 97.9°F | Resp 18 | Wt 167.9 lb

## 2019-03-17 DIAGNOSIS — C20 Malignant neoplasm of rectum: Secondary | ICD-10-CM | POA: Insufficient documentation

## 2019-03-17 DIAGNOSIS — R102 Pelvic and perineal pain: Secondary | ICD-10-CM | POA: Diagnosis not present

## 2019-03-17 DIAGNOSIS — F418 Other specified anxiety disorders: Secondary | ICD-10-CM

## 2019-03-17 DIAGNOSIS — Z9221 Personal history of antineoplastic chemotherapy: Secondary | ICD-10-CM

## 2019-03-17 DIAGNOSIS — Z79899 Other long term (current) drug therapy: Secondary | ICD-10-CM

## 2019-03-17 DIAGNOSIS — Z87440 Personal history of urinary (tract) infections: Secondary | ICD-10-CM | POA: Diagnosis not present

## 2019-03-17 DIAGNOSIS — Z923 Personal history of irradiation: Secondary | ICD-10-CM

## 2019-03-17 DIAGNOSIS — N390 Urinary tract infection, site not specified: Secondary | ICD-10-CM

## 2019-03-17 LAB — URINALYSIS, DIPSTICK ONLY
Bilirubin Urine: NEGATIVE
Glucose, UA: NEGATIVE mg/dL
Hgb urine dipstick: NEGATIVE
Ketones, ur: NEGATIVE mg/dL
Nitrite: NEGATIVE
Protein, ur: NEGATIVE mg/dL
Specific Gravity, Urine: 1.016 (ref 1.005–1.030)
pH: 6 (ref 5.0–8.0)

## 2019-03-17 LAB — CBC WITH DIFFERENTIAL/PLATELET
Abs Immature Granulocytes: 0.37 10*3/uL — ABNORMAL HIGH (ref 0.00–0.07)
Basophils Absolute: 0.1 10*3/uL (ref 0.0–0.1)
Basophils Relative: 1 %
Eosinophils Absolute: 0.6 10*3/uL — ABNORMAL HIGH (ref 0.0–0.5)
Eosinophils Relative: 4 %
HCT: 33.2 % — ABNORMAL LOW (ref 39.0–52.0)
Hemoglobin: 9.5 g/dL — ABNORMAL LOW (ref 13.0–17.0)
Immature Granulocytes: 3 %
Lymphocytes Relative: 14 %
Lymphs Abs: 2 10*3/uL (ref 0.7–4.0)
MCH: 23.9 pg — ABNORMAL LOW (ref 26.0–34.0)
MCHC: 28.6 g/dL — ABNORMAL LOW (ref 30.0–36.0)
MCV: 83.6 fL (ref 80.0–100.0)
Monocytes Absolute: 1.1 10*3/uL — ABNORMAL HIGH (ref 0.1–1.0)
Monocytes Relative: 8 %
Neutro Abs: 9.5 10*3/uL — ABNORMAL HIGH (ref 1.7–7.7)
Neutrophils Relative %: 70 %
Platelets: 561 10*3/uL — ABNORMAL HIGH (ref 150–400)
RBC: 3.97 MIL/uL — ABNORMAL LOW (ref 4.22–5.81)
RDW: 16.5 % — ABNORMAL HIGH (ref 11.5–15.5)
WBC: 13.7 10*3/uL — ABNORMAL HIGH (ref 4.0–10.5)
nRBC: 0 % (ref 0.0–0.2)

## 2019-03-17 LAB — COMPREHENSIVE METABOLIC PANEL
ALT: 31 U/L (ref 0–44)
AST: 18 U/L (ref 15–41)
Albumin: 3.1 g/dL — ABNORMAL LOW (ref 3.5–5.0)
Alkaline Phosphatase: 174 U/L — ABNORMAL HIGH (ref 38–126)
Anion gap: 11 (ref 5–15)
BUN: 24 mg/dL — ABNORMAL HIGH (ref 6–20)
CO2: 24 mmol/L (ref 22–32)
Calcium: 9.3 mg/dL (ref 8.9–10.3)
Chloride: 101 mmol/L (ref 98–111)
Creatinine, Ser: 1.53 mg/dL — ABNORMAL HIGH (ref 0.61–1.24)
GFR calc Af Amer: 60 mL/min — ABNORMAL LOW (ref >60)
GFR calc non Af Amer: 52 mL/min — ABNORMAL LOW (ref >60)
Glucose, Bld: 157 mg/dL — ABNORMAL HIGH (ref 70–99)
Potassium: 4.2 mmol/L (ref 3.5–5.1)
Sodium: 136 mmol/L (ref 135–145)
Total Bilirubin: 0.1 mg/dL — ABNORMAL LOW (ref 0.3–1.2)
Total Protein: 8.3 g/dL — ABNORMAL HIGH (ref 6.5–8.1)

## 2019-03-17 LAB — MAGNESIUM: Magnesium: 2 mg/dL (ref 1.7–2.4)

## 2019-03-17 MED ORDER — HEPARIN SOD (PORK) LOCK FLUSH 100 UNIT/ML IV SOLN
500.0000 [IU] | Freq: Once | INTRAVENOUS | Status: AC
  Administered 2019-03-17: 500 [IU] via INTRAVENOUS

## 2019-03-17 MED ORDER — SODIUM CHLORIDE 0.9% FLUSH
10.0000 mL | Freq: Once | INTRAVENOUS | Status: AC
  Administered 2019-03-17: 10 mL via INTRAVENOUS

## 2019-03-17 MED ORDER — SULFAMETHOXAZOLE-TRIMETHOPRIM 800-160 MG PO TABS: 1.0000 | ORAL_TABLET | Freq: Two times a day (BID) | ORAL | 0 refills | Status: DC

## 2019-03-17 NOTE — Progress Notes (Signed)
Jonathan Wright, [place] [id number]   CLINIC:  Medical Oncology/Hematology  PCP:  Deland Pretty, [address] [address] [place] [id number] [phone]   REASON FOR VISIT:  Follow-up for rectal adenocarcinoma   CURRENT THERAPY:FOLFIRI on hold.   BRIEF ONCOLOGIC HISTORY:  Oncology History Overview Note  Stage IIIB rectal adenocarcinoma, [id number] Treated with 5Gy x 5 at [facility]   Rectal cancer [facility])  09/20/2015 Procedure   colonoscopy with firm rectal mass and 4 cm polyp in proximal rectum, infiltrating non-obstructing large mass within the distal rectum extending towards anal verge   09/20/2015 Miscellaneous   Microsatellite stable.    09/28/2015 Imaging   CT C/A/P no evidence of distant metastatic disease. irregular thickening of the low rectum and anal canal, several enlarged perirectal LN, indeterminate 33m RUL nodule   09/28/2015 Imaging   MRI pelvis assym diffuse circum lobular thickening of rectum, near full wall thickness extension along lower anterior rectum W/O extra serosal extension c/w known adeno, mult. perirectal LN   12/06/2015 - 12/10/2015 Radiation Therapy   [facility] Dr. AAngelina Ok  2500.00 cGy, 500.00 cGy per day in 5 fractions delivered 1x per day to the 96.0% Isodose line Treatment Dates: 12/06/2015 through 12/10/2015.    01/03/2016 Procedure   PICC placed   01/04/2016 - 03/14/2016 Chemotherapy   FOLFOX x 6 cycles   06/07/2016 Definitive Surgery   Robotic assisted APR with coloanal anastomosis and DLI [facility]  06/19/2016 - 06/23/2016 Hospital Admission   Admit date: 06/19/2016 Admission diagnosis: Sepsis Additional comments: Sepsis due to multiple gram-negative active uremia. Likely [facility] following, perirectal abscess draining by itself, white count and temperatures improved. He  had a PICC line from prior to this admission which was line removed on 06/21/2016, vancomycin stopped on 06/22/2016, He tolerated test dose of Rocephin well on 06/22/2016 without any issues, [facility], requested to follow with PCP in 3 days to make sure he is tolerating oral medications well and has continued to defervesce, request PCP to please check final culture and sensitivity results which should be back in 3 days. He will follow with general surgery outpatient as well.   02/15/2017 Imaging   CT CAP- 1. Well-formed presacral abscess is decreased in size but persistent. 2. No evidence of colorectal cancer metastasis in the abdomen pelvis. 3. Loop ileostomy in the RIGHT lower quadrant.   04/05/2017 Imaging   CT CAP-IMPRESSION: 1. Status post loop right-sided ileostomy with sigmoid to anal anastomosis. 2. Decrease in presacral fluid collection, consistent with abscess. New foci of extraluminal gas, suspicious for fistulous communication to the lower sigmoid. 3. No evidence of metastatic disease.   04/20/2017 Imaging   MRI pelvis w/ and w/o contrast IMPRESSION: 1. No appreciable change in chronic complex presacral space abscess, which demonstrates four separate sites of fistulous communication to the sigmoid colon and colo-anal anastomosis, as detailed. No discrete mass or other findings to suggest local tumor recurrence. 2. No evidence of metastatic disease in the pelvis.   10/11/2018 Cancer Staging   Staging form: Colon and Rectum, AJCC 7th Edition - Pathologic: Stage IVB (TX, N1, M1b) - Signed by KDerek Jack MD on 10/11/2018   10/22/2018 -  Chemotherapy   The patient had palonosetron (ALOXI) injection 0.25 mg, 0.25 mg, Intravenous,  Once, 3 of 4 cycles Administration: 0.25 mg (10/22/2018), 0.25 mg (11/05/2018), 0.25 mg (11/19/2018) bevacizumab (AVASTIN) 350 mg in sodium chloride 0.9 % 100 mL chemo infusion, 5  mg/kg = 350 mg, Intravenous,  Once, 0 of 1 cycle irinotecan (CAMPTOSAR) 340 mg in dextrose 5 % 500 mL chemo infusion, 180 mg/m2 = 340 mg, Intravenous,  Once, 3 of 4 cycles Administration: 340 mg (10/22/2018), 340 mg (11/05/2018), 340 mg (11/19/2018) leucovorin 800 mg in dextrose 5 % 250 mL infusion, 756 mg, Intravenous,  Once, 3 of 4 cycles Administration: 800 mg (10/22/2018), 800 mg (11/05/2018), 800 mg (11/19/2018) fluorouracil (ADRUCIL) chemo injection 750 mg, 400 mg/m2 = 750 mg, Intravenous,  Once, 3 of 4 cycles Administration: 750 mg (10/22/2018), 750 mg (11/05/2018), 750 mg (11/19/2018) fluorouracil (ADRUCIL) 4,550 mg in sodium chloride 0.9 % 59 mL chemo infusion, 2,400 mg/m2 = 4,550 mg, Intravenous, 1 Day/Dose, 3 of 4 cycles Administration: 4,550 mg (10/22/2018), 4,550 mg (11/05/2018), 4,550 mg (11/19/2018)  for chemotherapy treatment.       CANCER STAGING: Cancer Staging Rectal cancer [facility]) Staging form: Colon and Rectum, AJCC 7th Edition - Clinical stage from 01/03/2016: Stage IIIB (T2, N2a, M0) - Signed by Baird Cancer, PA-C on 01/03/2016 - Pathologic: Stage IVB (TX, N1, M1b) - Signed by Derek Jack, MD on 10/11/2018    INTERVAL HISTORY:  Jonathan Wright 53 y.o. male seen for follow-up of his metastatic rectal cancer.  In the interim he was evaluated by [facility]. Vernard Gambles at [facility] and felt to be not a surgical candidate.  Another drain was placed which is putting out about 50 mL of white fluid.  Reported pressure in the pelvic area.  Appetite is 75%.  Energy levels are low.  He is using fentanyl 200 mcg patch every 72 hours which is controlling pain reasonably well.  He also reported occasional chills.  Pain in the pelvic area is reported as 8 out of 10.   REVIEW OF SYSTEMS:  Review of Systems  Constitutional: Positive for chills and fatigue.  All other systems reviewed and are negative.    PAST MEDICAL/SURGICAL HISTORY:  Past Medical History:  Diagnosis Date   Anxiety     Depression    Rectal cancer ([place]) 12/17/2015   Past Surgical History:  Procedure Laterality Date   ABDOMINAL PERINEAL BOWEL RESECTION  06/07/2016   [facility]   COLON SURGERY     DIVERTING ILEOSTOMY  06/07/2016   [facility]   INGUINAL LYMPH NODE BIOPSY Left 09/18/2018   Procedure: LEFT INGUINAL LYMPH NODE BIOPSY;  Surgeon: Aviva Signs, MD;  Location: [facility];  Service: General;  Laterality: Left;   IR RADIOLOGIST EVAL & MGMT  12/25/2018   LAPAROSCOPIC LOW ANTERIOR RESCECTION WITH COLOANAL ANASTOMOSIS  06/07/2016   [facility]   PERIPHERALLY INSERTED CENTRAL CATHETER INSERTION  11/2015     SOCIAL HISTORY:  Social History   Socioeconomic History   Marital status: Divorced    Spouse name: Not on file   Number of children: Not on file   Years of education: Not on file   Highest education level: Not on file  Occupational History   Not on file  Social Needs   Financial resource strain: Not on file   Food insecurity    Worry: Not on file    Inability: Not on file   Transportation needs    Medical: Not on file    Non-medical: Not on file  Tobacco Use   Smoking status: Never  Smoker   Smokeless tobacco: Never Used  Substance and Sexual Activity   Alcohol use: Yes    Comment: occ.    Drug use: No   Sexual activity: Not on file  Lifestyle   Physical activity    Days per week: Not on file    Minutes per session: Not on file   Stress: Not on file  Relationships   Social connections    Talks on phone: Not on file    Gets together: Not on file    Attends religious service: Not on file    Active member of club or organization: Not on file    Attends meetings of clubs or organizations: Not on file    Relationship status: Not on file   Intimate partner violence    Fear of current or ex partner: Not on file    Emotionally abused: Not on file    Physically abused: Not on file    Forced sexual activity: Not on file  Other Topics Concern    Not on file  Social History Narrative   Not on file    FAMILY HISTORY:  Family History  Problem Relation Age of Onset   Rectal cancer Neg Hx     CURRENT MEDICATIONS:  Outpatient Encounter Medications as of 03/17/2019  Medication Sig   ALPRAZolam (XANAX) 1 MG tablet Take 0.5 tablets (0.5 mg total) by mouth 2 (two) times daily as needed for anxiety.   fentaNYL (DURAGESIC) 100 MCG/HR Place 2 patches onto the skin every 3 (three) days.   FLUoxetine (PROZAC) 40 MG capsule Take 1 capsule (40 mg total) by mouth daily.   ondansetron (ZOFRAN ODT) 4 MG disintegrating tablet Take 1 tablet (4 mg total) by mouth every 8 (eight) hours as needed for nausea or vomiting.   Oxycodone HCl 10 MG TABS Take 1.5 tablets (15 mg total) by mouth every 3 (three) hours as needed.   prochlorperazine (COMPAZINE) 10 MG tablet Take 1 tablet (10 mg total) by mouth every 6 (six) hours as needed for nausea or vomiting.   sulfamethoxazole-trimethoprim (BACTRIM DS) 800-160 MG tablet Take 1 tablet by mouth 2 (two) times daily.   [DISCONTINUED] ciprofloxacin (CIPRO) 500 MG tablet Take 1 tablet (500 mg total) by mouth 2 (two) times daily. (Patient not taking: Reported on 02/04/2019)   [DISCONTINUED] metroNIDAZOLE (FLAGYL) 500 MG tablet Take 1 tablet (500 mg total) by mouth 3 (three) times daily. (Patient not taking: Reported on 02/04/2019)   [DISCONTINUED] sulfamethoxazole-trimethoprim (BACTRIM DS) 800-160 MG tablet Take 1 tablet by mouth 2 (two) times daily. (Patient not taking: Reported on 03/17/2019)   No facility-administered encounter medications on file as of 03/17/2019.     ALLERGIES:  No Known Allergies   PHYSICAL EXAM:  ECOG Performance status: 1  Blood pressure is 109/71.  Pulse rate is 116.  Respiratory is 18.  Temperature 98.  Oxygen saturation is 100.  Physical Exam Constitutional:      Appearance: Normal appearance.  Cardiovascular:     Rate and Rhythm: Normal rate and regular rhythm.      Heart sounds: Normal heart sounds.  Pulmonary:     Effort: Pulmonary effort is normal.     Breath sounds: Normal breath sounds.  Abdominal:     Palpations: Abdomen is soft.  Musculoskeletal:        General: No swelling.  Skin:    General: Skin is warm.  Neurological:     General: No focal deficit present.  Mental Status: He is alert and oriented to person, place, and time.  Psychiatric:        Mood and Affect: Mood normal.        Behavior: Behavior normal.    Drain has serosanguineous fluid.  LABORATORY DATA:  I have reviewed the labs as listed.  CBC    Component Value Date/Time   WBC 13.7 (H) 03/17/2019 0834   RBC 3.97 (L) 03/17/2019 0834   HGB 9.5 (L) 03/17/2019 0834   HCT 33.2 (L) 03/17/2019 0834   PLT 561 (H) 03/17/2019 0834   MCV 83.6 03/17/2019 0834   MCH 23.9 (L) 03/17/2019 0834   MCHC 28.6 (L) 03/17/2019 0834   RDW 16.5 (H) 03/17/2019 0834   LYMPHSABS 2.0 03/17/2019 0834   MONOABS 1.1 (H) 03/17/2019 0834   EOSABS 0.6 (H) 03/17/2019 0834   BASOSABS 0.1 03/17/2019 0834   CMP Latest Ref Rng & Units 03/17/2019 02/03/2019 01/27/2019  Glucose 70 - 99 mg/dL 157(H) 141(H) 132(H)  BUN 6 - 20 mg/dL 24(H) 22(H) 25(H)  Creatinine 0.61 - 1.24 mg/dL 1.53(H) 1.40(H) 1.41(H)  Sodium 135 - 145 mmol/L 136 135 136  Potassium 3.5 - 5.1 mmol/L 4.2 4.1 4.2  Chloride 98 - 111 mmol/L 101 100 101  CO2 22 - 32 mmol/L 24 21(L) 25  Calcium 8.9 - 10.3 mg/dL 9.3 8.9 9.0  Total Protein 6.5 - 8.1 g/dL 8.3(H) 8.1 7.4  Total Bilirubin 0.3 - 1.2 mg/dL 0.1(L) 0.4 0.1(L)  Alkaline Phos 38 - 126 U/L 174(H) 110 104  AST 15 - 41 U/L 18 17 15   ALT 0 - 44 U/L 31 15 14        DIAGNOSTIC IMAGING:  I have independently reviewed the scans and discussed with the patient.   I have reviewed Venita Lick LPN's note and agree with the documentation.  I personally performed a face-to-face visit, made revisions and my assessment and plan is as follows.    ASSESSMENT & PLAN:   Rectal cancer  ([place]) 1.  Metastatic rectal adenocarcinoma, K-ras G13D, MS-stable: - Pelvic exenteration at [facility] clinic with cystectomy, prostatectomy, resection of rectum/anus/anastomosis, pelvic sidewall with IORT, urinary reconstruction with ileal conduit and end colostomy and flap reconstruction done on 06/06/2018 with positive radial margin. - 3 cycles of palliative chemotherapy with FOLFIRI from 10/22/2018 through 11/19/2018 -CT-guided drain placement by IR on 12/09/2018.  He is still having some reddish-brown fluid. -He had another drain placed at [facility] about 2 weeks ago.  This is also putting out clear liquid about 40 mL/day. - He is complaining of having pelvic pressure.  He feels like he is having another UTI.  We have done a UA which showed positive leukocytes. -I have sent a prescription for Bactrim twice daily. -We had a prolonged discussion about further management of his metastatic cancer with palliative chemotherapy versus best supportive care in the form of hospice.  Patient would like to have his cancer treated actively. -I have recommended a CT CAP for restaging.  I will see him back next week to discuss the results and further plan.  2.  Pelvic pain: -She is using fentanyl 200 mcg patch every 72 hours. -He is also using oxycodone 15 mg every 3-4 hours as needed for breakthrough pain.  3.  Recurrent UTI: - Urine culture from 01/01/2019 showed Proteus, Morganella, enterococcus faecalis and enterococcus faecium.  This was susceptible to Cipro.  Total time spent is 40 minutes with more than 50% of the time spent face-to-face  discussing treatment plan, counseling and coordination of care.    Orders placed this encounter:  Orders Placed This Encounter  Procedures   CT Abdomen Pelvis W Contrast   CT Chest W Contrast   CEA      Derek Jack, MD [place] [phone]

## 2019-03-17 NOTE — Progress Notes (Signed)
I met with patient during visit with Dr. Delton Coombes.  Patient had questions about his treatment plan and what we were going to do with the drains.  I explained that the drains were going to be managed by Dr. Crisoforo Oxford at [facility] and anything pertaining to the drains should be sent to them.  I helped to explain the next steps in his care.  Patient was given the opportunity to ask questions and they were answered to his satisfaction.

## 2019-03-17 NOTE — Patient Instructions (Signed)
[place] at [facility] Discharge Instructions  You were seen today by Dr. Delton Coombes. He went over your recent lab results. He will see you back after your scans for labs and follow up.   Thank you for choosing [place] at [facility] to provide your oncology and hematology care.  To afford each patient quality time with our provider, please arrive at least 15 minutes before your scheduled appointment time.   If you have a lab appointment with the [facility] please come in thru the  Main Entrance and check in at the main information desk  You need to re-schedule your appointment should you arrive 10 or more minutes late.  We strive to give you quality time with our providers, and arriving late affects you and other patients whose appointments are after yours.  Also, if you no show three or more times for appointments you may be dismissed from the clinic at the providers discretion.     Again, thank you for choosing [facility].  Our hope is that these requests will decrease the amount of time that you wait before being seen by our physicians.       _____________________________________________________________  Should you have questions after your visit to [facility], please contact our office at [phone] between the hours of 8:00 a.m. and 4:30 p.m.  Voicemails left after 4:00 p.m. will not be returned until the following business day.  For prescription refill requests, have your pharmacy contact our office and allow 72 hours.    Cancer Center Support Programs:   > Cancer Support Group  2nd Tuesday of the month 1pm-2pm, Journey Room

## 2019-03-17 NOTE — Assessment & Plan Note (Signed)
1.  Metastatic rectal adenocarcinoma, K-ras G13D, MS-stable: - Pelvic exenteration at [facility] clinic with cystectomy, prostatectomy, resection of rectum/anus/anastomosis, pelvic sidewall with IORT, urinary reconstruction with ileal conduit and end colostomy and flap reconstruction done on 06/06/2018 with positive radial margin. - 3 cycles of palliative chemotherapy with FOLFIRI from 10/22/2018 through 11/19/2018 -CT-guided drain placement by IR on 12/09/2018.  He is still having some reddish-brown fluid. -He had another drain placed at [facility] about 2 weeks ago.  This is also putting out clear liquid about 40 mL/day. - He is complaining of having pelvic pressure.  He feels like he is having another UTI.  We have done a UA which showed positive leukocytes. -I have sent a prescription for Bactrim twice daily. -We had a prolonged discussion about further management of his metastatic cancer with palliative chemotherapy versus best supportive care in the form of hospice.  Patient would like to have his cancer treated actively. -I have recommended a CT CAP for restaging.  I will see him back next week to discuss the results and further plan.  2.  Pelvic pain: -She is using fentanyl 200 mcg patch every 72 hours. -He is also using oxycodone 15 mg every 3-4 hours as needed for breakthrough pain.  3.  Recurrent UTI: - Urine culture from 01/01/2019 showed Proteus, Morganella, enterococcus faecalis and enterococcus faecium.  This was susceptible to Cipro.

## 2019-03-18 LAB — CEA: CEA: 24.4 ng/mL — ABNORMAL HIGH (ref 0.0–4.7)

## 2019-03-19 ENCOUNTER — Other Ambulatory Visit (HOSPITAL_COMMUNITY): Payer: Self-pay | Admitting: *Deleted

## 2019-03-19 ENCOUNTER — Encounter (HOSPITAL_COMMUNITY): Payer: BC Managed Care – PPO

## 2019-03-19 DIAGNOSIS — K6289 Other specified diseases of anus and rectum: Secondary | ICD-10-CM

## 2019-03-19 DIAGNOSIS — C20 Malignant neoplasm of rectum: Secondary | ICD-10-CM

## 2019-03-19 MED ORDER — OXYCODONE HCL 10 MG PO TABS: 15.0000 mg | ORAL_TABLET | ORAL | 0 refills | Status: DC | PRN

## 2019-03-19 MED ORDER — FENTANYL 100 MCG/HR TD PT72: 2.0000 | MEDICATED_PATCH | TRANSDERMAL | 0 refills | Status: DC

## 2019-03-25 ENCOUNTER — Other Ambulatory Visit (HOSPITAL_COMMUNITY): Payer: Self-pay | Admitting: *Deleted

## 2019-03-25 MED ORDER — CIPROFLOXACIN HCL 500 MG PO TABS: 500.0000 mg | ORAL_TABLET | Freq: Two times a day (BID) | ORAL | 0 refills | Status: DC

## 2019-03-25 NOTE — Telephone Encounter (Signed)
Per Dr. Delton Coombes, orders sent to pharmacy for 10 days antibiotics.

## 2019-03-27 ENCOUNTER — Encounter (HOSPITAL_COMMUNITY): Payer: Self-pay | Admitting: *Deleted

## 2019-03-27 NOTE — Progress Notes (Signed)
A message was received from the patient's significant other.   "Please tell Dr. Mearl Latin needs more of the Bactrim. It was working and for the first time in months Jonathan Wright was getting up 6-8 hrs each day but he did not prescribe it long enough. Now that he has prescribed Ciprofloxacin which Jonathan Wright has gotten resistant to from taking it so long in the past he is hardly getting up much at all again. The cultures that were taken are the reason that Bactrim was prescribed in the first place. The fluid is the same so what is the problem? If I did not watch him every day and see the startling difference that the one antibiotic makes over the other I would not be begging for him to give more of the Bactrim that works long enough for it to knock out this infection so Jonathan Wright can move past it and have Chemo. It seems like Dr. Raliegh Ip is just insuring that will never happen. Please see what you can do and if Jonathan Wright needs to come up and have blood drawn through his Picc and give urine or even another culture to prove what we already know he will. But one thing he does not have is time to keep playing antibiotic games that are keeping him from getting better when we know the Bactrim is working but just needs to be given long enough. If Dr. Raliegh Ip smelled the foul drainage coming out of Jonathan Wright's drain he would understand at least I hope he would that longer course antibiotics are necessary. I hope your Friday is going well and I will hear back from you soon. I'm not mad at Dr. Raliegh Ip or you I just want Jonathan Wright to get better and I've seen first hand that the Bactrim was making him better and more active with his pain meds. It's not uncommon for people like Jonathan Wright that have been on Antibiotics for almost three years to need longer courses of them, my Mom who has gone through nothing like Jonathan Wright has needs 21 days of antibiotics generally when she gets infections. Anyway Thanks for whatever you can do."  The message was given to Dr. Delton Coombes and he wants  patient to know that the bactrim that he was given for a week was causing his kidney function to be elevated. That is why Dr. Delton Coombes gave him Cipro for 10 days.  Dr. Delton Coombes will discuss it further with patient when he comes in next week for his visit after scans.    I have sent the above message to the patient and at this time have not received a reply.

## 2019-03-31 ENCOUNTER — Ambulatory Visit (HOSPITAL_COMMUNITY)
Admission: RE | Admit: 2019-03-31 | Discharge: 2019-03-31 | Disposition: A | Discharge status: Home / Self Care | Payer: BC Managed Care – PPO | Source: Ambulatory Visit | Attending: Hematology | Admitting: Hematology

## 2019-03-31 ENCOUNTER — Other Ambulatory Visit: Payer: Self-pay

## 2019-03-31 DIAGNOSIS — C20 Malignant neoplasm of rectum: Secondary | ICD-10-CM | POA: Diagnosis present

## 2019-03-31 MED ORDER — IOHEXOL 300 MG/ML  SOLN
100.0000 mL | Freq: Once | INTRAMUSCULAR | Status: AC | PRN
  Administered 2019-03-31: 100 mL via INTRAVENOUS

## 2019-04-01 ENCOUNTER — Inpatient Hospital Stay (HOSPITAL_COMMUNITY): Payer: BC Managed Care – PPO | Attending: Hematology | Admitting: Hematology

## 2019-04-01 ENCOUNTER — Encounter (HOSPITAL_COMMUNITY): Payer: Self-pay | Admitting: Hematology

## 2019-04-01 DIAGNOSIS — Z87891 Personal history of nicotine dependence: Secondary | ICD-10-CM | POA: Diagnosis not present

## 2019-04-01 DIAGNOSIS — F418 Other specified anxiety disorders: Secondary | ICD-10-CM | POA: Diagnosis not present

## 2019-04-01 DIAGNOSIS — Z933 Colostomy status: Secondary | ICD-10-CM | POA: Insufficient documentation

## 2019-04-01 DIAGNOSIS — R102 Pelvic and perineal pain: Secondary | ICD-10-CM | POA: Insufficient documentation

## 2019-04-01 DIAGNOSIS — K632 Fistula of intestine: Secondary | ICD-10-CM | POA: Diagnosis not present

## 2019-04-01 DIAGNOSIS — C20 Malignant neoplasm of rectum: Secondary | ICD-10-CM | POA: Insufficient documentation

## 2019-04-01 DIAGNOSIS — Z923 Personal history of irradiation: Secondary | ICD-10-CM | POA: Diagnosis not present

## 2019-04-01 DIAGNOSIS — Z9221 Personal history of antineoplastic chemotherapy: Secondary | ICD-10-CM | POA: Diagnosis not present

## 2019-04-01 DIAGNOSIS — Z5111 Encounter for antineoplastic chemotherapy: Secondary | ICD-10-CM | POA: Diagnosis not present

## 2019-04-01 NOTE — Assessment & Plan Note (Signed)
1.  Metastatic rectal adenocarcinoma, K-ras G13 D, MS-stable: - Pelvic exenteration at [facility] clinic with cystectomy, prostatectomy, resection of the rectum/anus/anastomosis, pelvic sidewall with IORT, urinary reconstruction with ileal conduit and end colostomy and flap reconstruction done on 06/06/2018 with positive radial margin. -3 cycles of palliative chemotherapy with FOLFIRI from 10/22/2018 through 11/19/2018. -CT-guided drain placement by IR on 12/09/2018.  Left parasacral drain placed at [facility] in July of this year.  This is putting about 100 mL/day of bloody fluid. - He was being treated for recurrent infections with Cipro and Bactrim.  Urine culture grew Proteus Penneri and enterococcus faecalis. - We reviewed results of CT abdomen and pelvis dated 03/31/2019 showing left parasacral drain with left pelvic floor lesion/collection.  This has not clearly progressed in the interval measuring 7.6 x 5.6 cm.  The right parasacral catheter tip is coiled in the presacral fluid collection which shows evidence of superior and anterior progression. -I think the findings of increase in fluid collection is secondary to malignancy.  I have recommended going back on chemotherapy at this time. -We also talked about the risk with the chemotherapy in the setting of possible infection.  Patient was explained the pros and cons of treatment at this time.  He wants to proceed with the treatment. -We will schedule him next week with the same FOLFIRI regimen.  2.  Pelvic pain: - He is using fentanyl 200 mcg patch every 72 hours. -He is also using oxycodone 15 mg every 3-4 hours as needed for breakthrough pain.

## 2019-04-01 NOTE — Patient Instructions (Signed)
[place] at [facility] Discharge Instructions  You were seen today by Dr. Delton Coombes. He went over your recent scan results. He will see you back in 1 week for labs and follow up.   Thank you for choosing [place] at [facility] to provide your oncology and hematology care.  To afford each patient quality time with our provider, please arrive at least 15 minutes before your scheduled appointment time.   If you have a lab appointment with the [facility] please come in thru the  Main Entrance and check in at the main information desk  You need to re-schedule your appointment should you arrive 10 or more minutes late.  We strive to give you quality time with our providers, and arriving late affects you and other patients whose appointments are after yours.  Also, if you no show three or more times for appointments you may be dismissed from the clinic at the providers discretion.     Again, thank you for choosing [facility].  Our hope is that these requests will decrease the amount of time that you wait before being seen by our physicians.       _____________________________________________________________  Should you have questions after your visit to [facility], please contact our office at [phone] between the hours of 8:00 a.m. and 4:30 p.m.  Voicemails left after 4:00 p.m. will not be returned until the following business day.  For prescription refill requests, have your pharmacy contact our office and allow 72 hours.    Cancer Center Support Programs:   > Cancer Support Group  2nd Tuesday of the month 1pm-2pm, Journey Room

## 2019-04-01 NOTE — Progress Notes (Signed)
[place], [place] [address]   CLINIC:  Medical Oncology/Hematology  PCP:  Deland Pretty, [place] [address] [phone]   REASON FOR VISIT:  Follow-up for rectal adenocarcinoma   CURRENT THERAPY:FOLFIRI on hold.   BRIEF ONCOLOGIC HISTORY:  Oncology History Overview Note  Stage IIIB rectal adenocarcinoma, mT2N2M0 Treated with 5Gy x 5 at [facility]   Rectal cancer [facility])  09/20/2015 Procedure   colonoscopy with firm rectal mass and 4 cm polyp in proximal rectum, infiltrating non-obstructing large mass within the distal rectum extending towards anal verge   09/20/2015 Miscellaneous   Microsatellite stable.    09/28/2015 Imaging   CT C/A/P no evidence of distant metastatic disease. irregular thickening of the low rectum and anal canal, several enlarged perirectal LN, indeterminate 72m RUL nodule   09/28/2015 Imaging   MRI pelvis assym diffuse circum lobular thickening of rectum, near full wall thickness extension along lower anterior rectum W/O extra serosal extension c/w known adeno, mult. perirectal LN   12/06/2015 - 12/10/2015 Radiation Therapy   [facility] Dr. AAngelina Ok  2500.00 cGy, 500.00 cGy per day in 5 fractions delivered 1x per day to the 96.0% Isodose line Treatment Dates: 12/06/2015 through 12/10/2015.    01/03/2016 Procedure   PICC placed   01/04/2016 - 03/14/2016 Chemotherapy   FOLFOX x 6 cycles   06/07/2016 Definitive Surgery   Robotic assisted APR with coloanal [facility]  06/19/2016 - 06/23/2016 Hospital Admission   Admit date: 06/19/2016 Admission diagnosis: Sepsis Additional comments: Sepsis due to multiple gram-negative active uremia. Likely [facility] following, perirectal abscess draining by itself, white count and temperatures improved. He  had a PICC line from prior to this admission which was line removed on 06/21/2016, vancomycin stopped on 06/22/2016, He tolerated test dose of Rocephin well on 06/22/2016 without any issues, [facility], requested to follow with PCP in 3 days to make sure he is tolerating oral medications well and has continued to defervesce, request PCP to please check final culture and sensitivity results which should be back in 3 days. He will follow with general surgery outpatient as well.   02/15/2017 Imaging   CT CAP- 1. Well-formed presacral abscess is decreased in size but persistent. 2. No evidence of colorectal cancer metastasis in the abdomen pelvis. 3. Loop ileostomy in the RIGHT lower quadrant.   04/05/2017 Imaging   CT CAP-IMPRESSION: 1. Status post loop right-sided ileostomy with sigmoid to anal anastomosis. 2. Decrease in presacral fluid collection, consistent with abscess. New foci of extraluminal gas, suspicious for fistulous communication to the lower sigmoid. 3. No evidence of metastatic disease.   04/20/2017 Imaging   MRI pelvis w/ and w/o contrast IMPRESSION: 1. No appreciable change in chronic complex presacral space abscess, which demonstrates four separate sites of fistulous communication to the sigmoid colon and colo-anal anastomosis, as detailed. No discrete mass or other findings to suggest local tumor recurrence. 2. No evidence of metastatic disease in the pelvis.   10/11/2018 Cancer Staging   Staging form: Colon and Rectum, AJCC 7th Edition - Pathologic: Stage IVB (TX, N1, M1b) - Signed by KDerek Jack MD on 10/11/2018   10/22/2018 -  Chemotherapy   The patient had palonosetron (ALOXI) injection 0.25 mg, 0.25 mg, Intravenous,  Once, 3 of 4 cycles Administration: 0.25 mg (10/22/2018), 0.25 mg (11/05/2018), 0.25 mg (11/19/2018) bevacizumab (AVASTIN) 350 mg in sodium chloride 0.9 % 100 mL chemo infusion, 5  mg/kg = 350 mg, Intravenous,  Once, 0 of 1 cycle irinotecan (CAMPTOSAR) 340 mg in dextrose 5 % 500 mL chemo infusion, 180 mg/m2 = 340 mg, Intravenous,  Once, 3 of 4 cycles Administration: 340 mg (10/22/2018), 340 mg (11/05/2018), 340 mg (11/19/2018) leucovorin 800 mg in dextrose 5 % 250 mL infusion, 756 mg, Intravenous,  Once, 3 of 4 cycles Administration: 800 mg (10/22/2018), 800 mg (11/05/2018), 800 mg (11/19/2018) fluorouracil (ADRUCIL) chemo injection 750 mg, 400 mg/m2 = 750 mg, Intravenous,  Once, 3 of 4 cycles Administration: 750 mg (10/22/2018), 750 mg (11/05/2018), 750 mg (11/19/2018) fluorouracil (ADRUCIL) 4,550 mg in sodium chloride 0.9 % 59 mL chemo infusion, 2,400 mg/m2 = 4,550 mg, Intravenous, 1 Day/Dose, 3 of 4 cycles Administration: 4,550 mg (10/22/2018), 4,550 mg (11/05/2018), 4,550 mg (11/19/2018)  for chemotherapy treatment.       CANCER STAGING: Cancer Staging Rectal cancer [facility]) Staging form: Colon and Rectum, AJCC 7th Edition - Clinical stage from 01/03/2016: Stage IIIB (T2, N2a, M0) - Signed by Baird Cancer, PA-C on 01/03/2016 - Pathologic: Stage IVB (TX, N1, M1b) - Signed by Derek Jack, MD on 10/11/2018    INTERVAL HISTORY:  Mr. Erven 53 y.o. male is seen for follow-up of rectal cancer.  He is currently taking ciprofloxacin.  Denies any fevers or chills.  He reports that his parasacral drain on the left side is draining out about 100 mL of bloody fluid.  Denies any ER visits or hospitalizations.  Denies any nausea, vomiting or bowel changes.  No bleeding in the colostomy bag.  Appetite and energy levels are reasonably good.  REVIEW OF SYSTEMS:  Review of Systems  All other systems reviewed and are negative.    PAST MEDICAL/SURGICAL HISTORY:  Past Medical History:  Diagnosis Date  . Anxiety   . Depression   . Rectal cancer ([place]) 12/17/2015   Past Surgical History:  Procedure Laterality Date  . ABDOMINAL PERINEAL BOWEL RESECTION  06/07/2016   [facility]   . COLON SURGERY    . DIVERTING ILEOSTOMY  06/07/2016   [facility]  . INGUINAL LYMPH NODE BIOPSY Left 09/18/2018   Procedure: LEFT INGUINAL LYMPH NODE BIOPSY;  Surgeon: Aviva Signs, MD;  Location: [facility];  Service: General;  Laterality: Left;  . IR RADIOLOGIST EVAL & MGMT  12/25/2018  . LAPAROSCOPIC LOW ANTERIOR RESCECTION WITH COLOANAL ANASTOMOSIS  06/07/2016   [facility]  . PERIPHERALLY INSERTED CENTRAL CATHETER INSERTION  11/2015     SOCIAL HISTORY:  Social History   Socioeconomic History  . Marital status: Divorced    Spouse name: Not on file  . Number of children: Not on file  . Years of education: Not on file  . Highest education level: Not on file  Occupational History  . Not on file  Social Needs  . Financial resource strain: Not on file  . Food insecurity    Worry: Not on file    Inability: Not on file  . Transportation needs    Medical: Not on file    Non-medical: Not on file  Tobacco Use  . Smoking status: Never Smoker  . Smokeless tobacco: Never Used  Substance and Sexual Activity  . Alcohol use: Yes    Comment: occ.   . Drug use: No  . Sexual activity: Not on file  Lifestyle  . Physical activity    Days per week: Not on file    Minutes per session: Not on file  . Stress: Not on file  Relationships  . Social Herbalist on phone: Not on file    Gets together: Not on file    Attends religious service: Not on file    Active member of club or organization: Not on file    Attends meetings of clubs or organizations: Not on file    Relationship status: Not on file  . Intimate partner violence    Fear of current or ex partner: Not on file    Emotionally abused: Not on file    Physically abused: Not on file    Forced sexual activity: Not on file  Other Topics Concern  . Not on file  Social History Narrative  . Not on file    FAMILY HISTORY:  Family History  Problem Relation Age of Onset  . Rectal cancer Neg Hx     CURRENT  MEDICATIONS:  Outpatient Encounter Medications as of 04/01/2019  Medication Sig  . ALPRAZolam (XANAX) 1 MG tablet Take 0.5 tablets (0.5 mg total) by mouth 2 (two) times daily as needed for anxiety.  . ciprofloxacin (CIPRO) 500 MG tablet Take 1 tablet (500 mg total) by mouth 2 (two) times daily.  . fentaNYL (DURAGESIC) 100 MCG/HR Place 2 patches onto the skin every 3 (three) days.  Marland Kitchen FLUoxetine (PROZAC) 40 MG capsule Take 1 capsule (40 mg total) by mouth daily.  . ondansetron (ZOFRAN ODT) 4 MG disintegrating tablet Take 1 tablet (4 mg total) by mouth every 8 (eight) hours as needed for nausea or vomiting.  . Oxycodone HCl 10 MG TABS Take 1.5 tablets (15 mg total) by mouth every 3 (three) hours as needed.  . prochlorperazine (COMPAZINE) 10 MG tablet Take 1 tablet (10 mg total) by mouth every 6 (six) hours as needed for nausea or vomiting.   No facility-administered encounter medications on file as of 04/01/2019.     ALLERGIES:  No Known Allergies   PHYSICAL EXAM:  ECOG Performance status: 1  Blood pressure is 109/71.  Pulse rate is 116.  Respiratory is 18.  Temperature 98.  Oxygen saturation is 100.  Physical Exam Constitutional:      Appearance: Normal appearance.  Cardiovascular:     Rate and Rhythm: Normal rate and regular rhythm.     Heart sounds: Normal heart sounds.  Pulmonary:     Effort: Pulmonary effort is normal.     Breath sounds: Normal breath sounds.  Abdominal:     Palpations: Abdomen is soft.  Musculoskeletal:        General: No swelling.  Skin:    General: Skin is warm.  Neurological:     General: No focal deficit present.     Mental Status: He is alert and oriented to person, place, and time.  Psychiatric:        Mood and Affect: Mood normal.        Behavior: Behavior normal.    Drain has serosanguineous fluid.  LABORATORY DATA:  I have reviewed the labs as listed.  CBC    Component Value Date/Time   WBC 13.7 (H) 03/17/2019 0834   RBC 3.97 (L)  03/17/2019 0834   HGB 9.5 (L) 03/17/2019 0834   HCT 33.2 (L) 03/17/2019 0834   PLT 561 (H) 03/17/2019 0834   MCV 83.6 03/17/2019 0834   MCH 23.9 (L) 03/17/2019 2353  MCHC 28.6 (L) 03/17/2019 0834   RDW 16.5 (H) 03/17/2019 0834   LYMPHSABS 2.0 03/17/2019 0834   MONOABS 1.1 (H) 03/17/2019 0834   EOSABS 0.6 (H) 03/17/2019 0834   BASOSABS 0.1 03/17/2019 0834   CMP Latest Ref Rng & Units 03/17/2019 02/03/2019 01/27/2019  Glucose 70 - 99 mg/dL 157(H) 141(H) 132(H)  BUN 6 - 20 mg/dL 24(H) 22(H) 25(H)  Creatinine 0.61 - 1.24 mg/dL 1.53(H) 1.40(H) 1.41(H)  Sodium 135 - 145 mmol/L 136 135 136  Potassium 3.5 - 5.1 mmol/L 4.2 4.1 4.2  Chloride 98 - 111 mmol/L 101 100 101  CO2 22 - 32 mmol/L 24 21(L) 25  Calcium 8.9 - 10.3 mg/dL 9.3 8.9 9.0  Total Protein 6.5 - 8.1 g/dL 8.3(H) 8.1 7.4  Total Bilirubin 0.3 - 1.2 mg/dL 0.1(L) 0.4 0.1(L)  Alkaline Phos 38 - 126 U/L 174(H) 110 104  AST 15 - 41 U/L '18 17 15  '$ ALT 0 - 44 U/L '31 15 14       '$ DIAGNOSTIC IMAGING:  I have independently reviewed the scans and discussed with the patient.   I have reviewed Venita Lick LPN's note and agree with the documentation.  I personally performed a face-to-face visit, made revisions and my assessment and plan is as follows.    ASSESSMENT & PLAN:   Rectal cancer ([place]) 1.  Metastatic rectal adenocarcinoma, K-ras G13 D, MS-stable: - Pelvic exenteration at [facility] clinic with cystectomy, prostatectomy, resection of the rectum/anus/anastomosis, pelvic sidewall with IORT, urinary reconstruction with ileal conduit and end colostomy and flap reconstruction done on 06/06/2018 with positive radial margin. -3 cycles of palliative chemotherapy with FOLFIRI from 10/22/2018 through 11/19/2018. -CT-guided drain placement by IR on 12/09/2018.  Left parasacral drain [facility] at [facility] in July of this year.  This is putting about 100 mL/day of bloody fluid. - He was being treated for recurrent infections with Cipro  and Bactrim.  Urine culture grew Proteus Penneri and enterococcus faecalis. - We reviewed results of CT abdomen and pelvis dated 03/31/2019 showing left parasacral drain with left pelvic floor lesion/collection.  This has not clearly progressed in the interval measuring 7.6 x 5.6 cm.  The right parasacral catheter tip is coiled in the presacral fluid collection which shows evidence of superior and anterior progression. -I think the findings of increase in fluid collection is secondary to malignancy.  I have recommended going back on chemotherapy at this time. -We also talked about the risk with the chemotherapy in the setting of possible infection.  Patient was explained the pros and cons of treatment at this time.  He wants to proceed with the treatment. -We will schedule him next week with the same FOLFIRI regimen.  2.  Pelvic pain: - He is using fentanyl 200 mcg patch every 72 hours. -He is also using oxycodone 15 mg every 3-4 hours as needed for breakthrough pain.  Total time spent is 40 minutes with more than 50% of the time spent face-to-face discussing scan results, treatment plan, counseling and coordination of care.    Orders placed this encounter:  No orders of the defined types were placed in this encounter.     Derek Jack, MD [place] [phone]

## 2019-04-02 ENCOUNTER — Other Ambulatory Visit (HOSPITAL_COMMUNITY): Payer: Self-pay | Admitting: *Deleted

## 2019-04-02 DIAGNOSIS — K6289 Other specified diseases of anus and rectum: Secondary | ICD-10-CM

## 2019-04-02 DIAGNOSIS — C20 Malignant neoplasm of rectum: Secondary | ICD-10-CM

## 2019-04-03 ENCOUNTER — Other Ambulatory Visit (HOSPITAL_COMMUNITY): Payer: Self-pay | Admitting: Hematology

## 2019-04-03 MED ORDER — CIPROFLOXACIN HCL 500 MG PO TABS: 500.0000 mg | ORAL_TABLET | Freq: Two times a day (BID) | ORAL | 0 refills | Status: DC

## 2019-04-03 MED ORDER — OXYCODONE HCL 10 MG PO TABS: 15.0000 mg | ORAL_TABLET | ORAL | 0 refills | Status: DC | PRN

## 2019-04-03 MED ORDER — FENTANYL 100 MCG/HR TD PT72: 2.0000 | MEDICATED_PATCH | TRANSDERMAL | 0 refills | Status: DC

## 2019-04-08 ENCOUNTER — Encounter (HOSPITAL_COMMUNITY): Payer: Self-pay

## 2019-04-08 ENCOUNTER — Inpatient Hospital Stay (HOSPITAL_COMMUNITY): Payer: BC Managed Care – PPO

## 2019-04-08 ENCOUNTER — Other Ambulatory Visit: Payer: Self-pay

## 2019-04-08 ENCOUNTER — Other Ambulatory Visit (HOSPITAL_COMMUNITY): Payer: BC Managed Care – PPO

## 2019-04-08 DIAGNOSIS — C20 Malignant neoplasm of rectum: Secondary | ICD-10-CM

## 2019-04-08 LAB — CBC WITH DIFFERENTIAL/PLATELET
Abs Immature Granulocytes: 0.34 10*3/uL — ABNORMAL HIGH (ref 0.00–0.07)
Basophils Absolute: 0.1 10*3/uL (ref 0.0–0.1)
Basophils Relative: 1 %
Eosinophils Absolute: 0.9 10*3/uL — ABNORMAL HIGH (ref 0.0–0.5)
Eosinophils Relative: 5 %
HCT: 28.7 % — ABNORMAL LOW (ref 39.0–52.0)
Hemoglobin: 8.4 g/dL — ABNORMAL LOW (ref 13.0–17.0)
Immature Granulocytes: 2 %
Lymphocytes Relative: 9 %
Lymphs Abs: 1.4 10*3/uL (ref 0.7–4.0)
MCH: 23.4 pg — ABNORMAL LOW (ref 26.0–34.0)
MCHC: 29.3 g/dL — ABNORMAL LOW (ref 30.0–36.0)
MCV: 79.9 fL — ABNORMAL LOW (ref 80.0–100.0)
Monocytes Absolute: 1.5 10*3/uL — ABNORMAL HIGH (ref 0.1–1.0)
Monocytes Relative: 9 %
Neutro Abs: 11.7 10*3/uL — ABNORMAL HIGH (ref 1.7–7.7)
Neutrophils Relative %: 74 %
Platelets: 578 10*3/uL — ABNORMAL HIGH (ref 150–400)
RBC: 3.59 MIL/uL — ABNORMAL LOW (ref 4.22–5.81)
RDW: 16.4 % — ABNORMAL HIGH (ref 11.5–15.5)
WBC: 15.9 10*3/uL — ABNORMAL HIGH (ref 4.0–10.5)
nRBC: 0 % (ref 0.0–0.2)

## 2019-04-08 LAB — MAGNESIUM: Magnesium: 2 mg/dL (ref 1.7–2.4)

## 2019-04-08 LAB — COMPREHENSIVE METABOLIC PANEL
ALT: 19 U/L (ref 0–44)
AST: 21 U/L (ref 15–41)
Albumin: 2.8 g/dL — ABNORMAL LOW (ref 3.5–5.0)
Alkaline Phosphatase: 137 U/L — ABNORMAL HIGH (ref 38–126)
Anion gap: 10 (ref 5–15)
BUN: 18 mg/dL (ref 6–20)
CO2: 24 mmol/L (ref 22–32)
Calcium: 8.9 mg/dL (ref 8.9–10.3)
Chloride: 101 mmol/L (ref 98–111)
Creatinine, Ser: 1.42 mg/dL — ABNORMAL HIGH (ref 0.61–1.24)
GFR calc Af Amer: >60 mL/min (ref >60)
GFR calc non Af Amer: 56 mL/min — ABNORMAL LOW (ref >60)
Glucose, Bld: 168 mg/dL — ABNORMAL HIGH (ref 70–99)
Potassium: 3.9 mmol/L (ref 3.5–5.1)
Sodium: 135 mmol/L (ref 135–145)
Total Bilirubin: 0.3 mg/dL (ref 0.3–1.2)
Total Protein: 7.9 g/dL (ref 6.5–8.1)

## 2019-04-08 MED ORDER — HEPARIN SOD (PORK) LOCK FLUSH 100 UNIT/ML IV SOLN
250.0000 [IU] | Freq: Once | INTRAVENOUS | Status: AC
  Administered 2019-04-08: 250 [IU] via INTRAVENOUS

## 2019-04-08 MED ORDER — SODIUM CHLORIDE 0.9% FLUSH
10.0000 mL | Freq: Once | INTRAVENOUS | Status: AC
  Administered 2019-04-08: 10 mL via INTRAVENOUS

## 2019-04-08 NOTE — Progress Notes (Signed)
Jonathan Wright presented for PICC line flush. PICC line located right Good blood return present only on one lumen PICC line flushed with 37ml NS and 300U/12ml Heparin. Procedure without incident. Patient tolerated procedure well. Labs drawn today per orders.     Vitals stable and discharged home from clinic ambulatory. Follow up as scheduled.

## 2019-04-09 ENCOUNTER — Inpatient Hospital Stay (HOSPITAL_COMMUNITY): Payer: BC Managed Care – PPO | Admitting: Hematology

## 2019-04-09 ENCOUNTER — Encounter (HOSPITAL_COMMUNITY): Payer: Self-pay | Admitting: Hematology

## 2019-04-09 ENCOUNTER — Inpatient Hospital Stay (HOSPITAL_COMMUNITY): Payer: BC Managed Care – PPO

## 2019-04-09 VITALS — BP 99/66 | HR 84 | Temp 97.3°F | Resp 18

## 2019-04-09 DIAGNOSIS — C20 Malignant neoplasm of rectum: Secondary | ICD-10-CM

## 2019-04-09 MED ORDER — SODIUM CHLORIDE 0.9 % IV SOLN
180.0000 mg/m2 | Freq: Once | INTRAVENOUS | Status: AC
  Administered 2019-04-09: 340 mg via INTRAVENOUS
  Filled 2019-04-09: qty 2

## 2019-04-09 MED ORDER — PALONOSETRON HCL INJECTION 0.25 MG/5ML
0.2500 mg | Freq: Once | INTRAVENOUS | Status: AC
  Administered 2019-04-09: 0.25 mg via INTRAVENOUS

## 2019-04-09 MED ORDER — SODIUM CHLORIDE 0.9 % IV SOLN
Freq: Once | INTRAVENOUS | Status: AC
  Administered 2019-04-09: 11:00:00 via INTRAVENOUS

## 2019-04-09 MED ORDER — FLUOROURACIL CHEMO INJECTION 2.5 GM/50ML
400.0000 mg/m2 | Freq: Once | INTRAVENOUS | Status: AC
  Administered 2019-04-09: 750 mg via INTRAVENOUS
  Filled 2019-04-09: qty 15

## 2019-04-09 MED ORDER — SODIUM CHLORIDE 0.9% IV SOLUTION
2400.0000 mg/m2 | INTRAVENOUS | Status: DC
  Administered 2019-04-09: 4550 mg via INTRAVENOUS
  Filled 2019-04-09: qty 91

## 2019-04-09 MED ORDER — PALONOSETRON HCL INJECTION 0.25 MG/5ML
INTRAVENOUS | Status: AC
  Filled 2019-04-09: qty 5

## 2019-04-09 MED ORDER — ATROPINE SULFATE 1 MG/ML IJ SOLN
INTRAMUSCULAR | Status: AC
  Filled 2019-04-09: qty 1

## 2019-04-09 MED ORDER — SODIUM CHLORIDE 0.9% FLUSH: 10.0000 mL | INTRAVENOUS | Status: DC | PRN

## 2019-04-09 MED ORDER — SODIUM CHLORIDE 0.9 % IV SOLN
10.0000 mg | Freq: Once | INTRAVENOUS | Status: AC
  Administered 2019-04-09: 10 mg via INTRAVENOUS
  Filled 2019-04-09: qty 10

## 2019-04-09 MED ORDER — SODIUM CHLORIDE 0.9 % IV SOLN
423.0000 mg/m2 | Freq: Once | INTRAVENOUS | Status: AC
  Administered 2019-04-09: 800 mg via INTRAVENOUS
  Filled 2019-04-09: qty 35

## 2019-04-09 MED ORDER — ATROPINE SULFATE 1 MG/ML IJ SOLN
0.5000 mg | Freq: Once | INTRAMUSCULAR | Status: AC
  Administered 2019-04-09: 0.5 mg via INTRAVENOUS

## 2019-04-09 NOTE — Progress Notes (Signed)
[place], [place] [address]   CLINIC:  Medical Oncology/Hematology  PCP:  Deland Pretty, [place] [address] [phone]   REASON FOR VISIT:  Follow-up for rectal adenocarcinoma   CURRENT THERAPY:FOLFIRI on hold.   BRIEF ONCOLOGIC HISTORY:  Oncology History Overview Note  Stage IIIB rectal adenocarcinoma, [id number] Treated with 5Gy x 5 at [facility]   Rectal cancer [facility])  09/20/2015 Procedure   colonoscopy with firm rectal mass and 4 cm polyp in proximal rectum, infiltrating non-obstructing large mass within the distal rectum extending towards anal verge   09/20/2015 Miscellaneous   Microsatellite stable.    09/28/2015 Imaging   CT C/A/P no evidence of distant metastatic disease. irregular thickening of the low rectum and anal canal, several enlarged perirectal LN, indeterminate 72m RUL nodule   09/28/2015 Imaging   MRI pelvis assym diffuse circum lobular thickening of rectum, near full wall thickness extension along lower anterior rectum W/O extra serosal extension c/w known adeno, mult. perirectal LN   12/06/2015 - 12/10/2015 Radiation Therapy   [facility] Dr. AAngelina Ok  2500.00 cGy, 500.00 cGy per day in 5 fractions delivered 1x per day to the 96.0% Isodose line Treatment Dates: 12/06/2015 through 12/10/2015.    01/03/2016 Procedure   PICC placed   01/04/2016 - 03/14/2016 Chemotherapy   FOLFOX x 6 cycles   06/07/2016 Definitive Surgery   Robotic assisted APR with coloanal [facility]  06/19/2016 - 06/23/2016 Hospital Admission   Admit date: 06/19/2016 Admission diagnosis: Sepsis Additional comments: Sepsis due to multiple gram-negative active uremia. Likely source [facility] following, perirectal abscess draining by itself, white count and temperatures improved. He  had a PICC line from prior to this admission which was line removed on 06/21/2016, vancomycin stopped on 06/22/2016, He tolerated test dose of Rocephin well on 06/22/2016 without any issues, [facility], requested to follow with PCP in 3 days to make sure he is tolerating oral medications well and has continued to defervesce, request PCP to please check final culture and sensitivity results which should be back in 3 days. He will follow with general surgery outpatient as well.   02/15/2017 Imaging   CT CAP- 1. Well-formed presacral abscess is decreased in size but persistent. 2. No evidence of colorectal cancer metastasis in the abdomen pelvis. 3. Loop ileostomy in the RIGHT lower quadrant.   04/05/2017 Imaging   CT CAP-IMPRESSION: 1. Status post loop right-sided ileostomy with sigmoid to anal anastomosis. 2. Decrease in presacral fluid collection, consistent with abscess. New foci of extraluminal gas, suspicious for fistulous communication to the lower sigmoid. 3. No evidence of metastatic disease.   04/20/2017 Imaging   MRI pelvis w/ and w/o contrast IMPRESSION: 1. No appreciable change in chronic complex presacral space abscess, which demonstrates four separate sites of fistulous communication to the sigmoid colon and colo-anal anastomosis, as detailed. No discrete mass or other findings to suggest local tumor recurrence. 2. No evidence of metastatic disease in the pelvis.   10/11/2018 Cancer Staging   Staging form: Colon and Rectum, AJCC 7th Edition - Pathologic: Stage IVB (TX, N1, M1b) - Signed by KDerek Jack MD on 10/11/2018   10/22/2018 -  Chemotherapy   The patient had palonosetron (ALOXI) injection 0.25 mg, 0.25 mg, Intravenous,  Once, 4 of 10 cycles Administration: 0.25 mg (10/22/2018), 0.25 mg (11/05/2018), 0.25 mg (11/19/2018) bevacizumab (AVASTIN) 350 mg in sodium chloride 0.9 % 100 mL chemo infusion,  5 mg/kg = 350 mg, Intravenous,  Once, 0 of 6 cycles irinotecan (CAMPTOSAR) 340 mg in dextrose 5 % 500 mL chemo infusion, 180 mg/m2 = 340 mg, Intravenous,  Once, 4 of 10 cycles Administration: 340 mg (10/22/2018), 340 mg (11/05/2018), 340 mg (11/19/2018) leucovorin 800 mg in dextrose 5 % 250 mL infusion, 756 mg, Intravenous,  Once, 4 of 10 cycles Administration: 800 mg (10/22/2018), 800 mg (11/05/2018), 800 mg (11/19/2018) fluorouracil (ADRUCIL) chemo injection 750 mg, 400 mg/m2 = 750 mg, Intravenous,  Once, 4 of 10 cycles Administration: 750 mg (10/22/2018), 750 mg (11/05/2018), 750 mg (11/19/2018) fluorouracil (ADRUCIL) 4,550 mg in sodium chloride 0.9 % 59 mL chemo infusion, 2,400 mg/m2 = 4,550 mg, Intravenous, 1 Day/Dose, 4 of 10 cycles Administration: 4,550 mg (10/22/2018), 4,550 mg (11/05/2018), 4,550 mg (11/19/2018)  for chemotherapy treatment.       CANCER STAGING: Cancer Staging Rectal cancer [facility]) Staging form: Colon and Rectum, AJCC 7th Edition - Clinical stage from 01/03/2016: Stage IIIB (T2, N2a, M0) - Signed by Baird Cancer, PA-C on 01/03/2016 - Pathologic: Stage IVB (TX, N1, M1b) - Signed by Derek Jack, MD on 10/11/2018    INTERVAL HISTORY:  Mr. Hur 53 y.o. male seen for follow-up of recurrent/metastatic rectal cancer.  He is ready to get started on chemotherapy.  Appetite is 75%.  Energy levels are low.  Pain in the pelvis is rated a 7 out of 10.  He is using fentanyl 2 patches and oxycodone for breakthrough pain.  The drain is still putting out about 100 mL/day.  The drainage fluid is serosanguineous.  Denies any fevers or chills.  Complains of fatigue.  REVIEW OF SYSTEMS:  Review of Systems  Constitutional: Positive for fatigue.  All other systems reviewed and are negative.    PAST MEDICAL/SURGICAL HISTORY:  Past Medical History:  Diagnosis Date  . Anxiety   . Depression   . Rectal cancer ([place]) 12/17/2015   Past Surgical History:  Procedure Laterality Date  .  ABDOMINAL PERINEAL BOWEL RESECTION  06/07/2016   [facility]  . COLON SURGERY    . DIVERTING ILEOSTOMY  06/07/2016   [facility]  . INGUINAL LYMPH NODE BIOPSY Left 09/18/2018   Procedure: LEFT INGUINAL LYMPH NODE BIOPSY;  Surgeon: Aviva Signs, MD;  Location: [facility];  Service: General;  Laterality: Left;  . IR RADIOLOGIST EVAL & MGMT  12/25/2018  . LAPAROSCOPIC LOW ANTERIOR RESCECTION WITH COLOANAL ANASTOMOSIS  06/07/2016   [facility]  . PERIPHERALLY INSERTED CENTRAL CATHETER INSERTION  11/2015     SOCIAL HISTORY:  Social History   Socioeconomic History  . Marital status: Divorced    Spouse name: Not on file  . Number of children: Not on file  . Years of education: Not on file  . Highest education level: Not on file  Occupational History  . Not on file  Social Needs  . Financial resource strain: Not on file  . Food insecurity    Worry: Not on file    Inability: Not on file  . Transportation needs    Medical: Not on file    Non-medical: Not on file  Tobacco Use  . Smoking status: Never Smoker  . Smokeless tobacco: Never Used  Substance and Sexual Activity  . Alcohol use: Yes    Comment: occ.   Marland Kitchen  Drug use: No  . Sexual activity: Not on file  Lifestyle  . Physical activity    Days per week: Not on file    Minutes per session: Not on file  . Stress: Not on file  Relationships  . Social Herbalist on phone: Not on file    Gets together: Not on file    Attends religious service: Not on file    Active member of club or organization: Not on file    Attends meetings of clubs or organizations: Not on file    Relationship status: Not on file  . Intimate partner violence    Fear of current or ex partner: Not on file    Emotionally abused: Not on file    Physically abused: Not on file    Forced sexual activity: Not on file  Other Topics Concern  . Not on file  Social History Narrative  . Not on file    FAMILY HISTORY:  Family History  Problem  Relation Age of Onset  . Rectal cancer Neg Hx     CURRENT MEDICATIONS:  Outpatient Encounter Medications as of 04/09/2019  Medication Sig  . ALPRAZolam (XANAX) 1 MG tablet Take 0.5 tablets (0.5 mg total) by mouth 2 (two) times daily as needed for anxiety.  . ciprofloxacin (CIPRO) 500 MG tablet Take 1 tablet (500 mg total) by mouth 2 (two) times daily.  . fentaNYL (DURAGESIC) 100 MCG/HR Place 2 patches onto the skin every 3 (three) days.  Marland Kitchen FLUoxetine (PROZAC) 40 MG capsule Take 1 capsule (40 mg total) by mouth daily.  . ondansetron (ZOFRAN ODT) 4 MG disintegrating tablet Take 1 tablet (4 mg total) by mouth every 8 (eight) hours as needed for nausea or vomiting.  . Oxycodone HCl 10 MG TABS Take 1.5 tablets (15 mg total) by mouth every 3 (three) hours as needed.  . prochlorperazine (COMPAZINE) 10 MG tablet Take 1 tablet (10 mg total) by mouth every 6 (six) hours as needed for nausea or vomiting.   No facility-administered encounter medications on file as of 04/09/2019.     ALLERGIES:  No Known Allergies   PHYSICAL EXAM:  ECOG Performance status: 1  Blood pressure is 110/65.  Pulse rate is 107.  Respiratory is 18.  Temperature 98.  Oxygen saturations are 100%.  Physical Exam Constitutional:      Appearance: Normal appearance.  Cardiovascular:     Rate and Rhythm: Normal rate and regular rhythm.     Heart sounds: Normal heart sounds.  Pulmonary:     Effort: Pulmonary effort is normal.     Breath sounds: Normal breath sounds.  Abdominal:     Palpations: Abdomen is soft.  Musculoskeletal:        General: No swelling.  Skin:    General: Skin is warm.  Neurological:     General: No focal deficit present.     Mental Status: He is alert and oriented to person, place, and time.  Psychiatric:        Mood and Affect: Mood normal.        Behavior: Behavior normal.    Drain has serosanguineous fluid.  LABORATORY DATA:  I have reviewed the labs as listed.  CBC    Component Value  Date/Time   WBC 15.9 (H) 04/08/2019 1315   RBC 3.59 (L) 04/08/2019 1315   HGB 8.4 (L) 04/08/2019 1315   HCT 28.7 (L) 04/08/2019 1315   PLT 578 (H) 04/08/2019 1315  MCV 79.9 (L) 04/08/2019 1315   MCH 23.4 (L) 04/08/2019 1315   MCHC 29.3 (L) 04/08/2019 1315   RDW 16.4 (H) 04/08/2019 1315   LYMPHSABS 1.4 04/08/2019 1315   MONOABS 1.5 (H) 04/08/2019 1315   EOSABS 0.9 (H) 04/08/2019 1315   BASOSABS 0.1 04/08/2019 1315   CMP Latest Ref Rng & Units 04/08/2019 03/17/2019 02/03/2019  Glucose 70 - 99 mg/dL 168(H) 157(H) 141(H)  BUN 6 - 20 mg/dL 18 24(H) 22(H)  Creatinine 0.61 - 1.24 mg/dL 1.42(H) 1.53(H) 1.40(H)  Sodium 135 - 145 mmol/L 135 136 135  Potassium 3.5 - 5.1 mmol/L 3.9 4.2 4.1  Chloride 98 - 111 mmol/L 101 101 100  CO2 22 - 32 mmol/L 24 24 21(L)  Calcium 8.9 - 10.3 mg/dL 8.9 9.3 8.9  Total Protein 6.5 - 8.1 g/dL 7.9 8.3(H) 8.1  Total Bilirubin 0.3 - 1.2 mg/dL 0.3 0.1(L) 0.4  Alkaline Phos 38 - 126 U/L 137(H) 174(H) 110  AST 15 - 41 U/L _0 ALT 0 - 44 U/L _1 DIAGNOSTIC IMAGING:  I have independently reviewed the scans and discussed with the patient.   I have reviewed Venita Lick LPN's note and agree with the documentation.  I personally performed a face-to-face visit, made revisions and my assessment and plan is as follows.    ASSESSMENT & PLAN:   Rectal cancer ([place]) 1.  Metastatic rectal adenocarcinoma, K-ras G 13 D, MS-stable: - Pelvic exenteration at [facility] clinic on 06/06/2018. -3 cycles of palliative chemotherapy with FOLFIRI from 10/22/2018 through 11/19/2018. -CT-guided drain placement by IR on 12/09/2018.  Left parasacral drain placed at [facility] in July of this year.  This is still draining about 100 mL/day of bloody fluid. - CTAP on 03/31/2019 shows left parasacral drain with left pelvic floor lesion/collection.  This has clearly progressed in the interval measuring 7.6 x 5.6 cm.  Right parasacral catheter tip is coiled in the presacral fluid  collection which shows evidence of superior and anterior progression. - Because of the progression, I have recommended resuming chemotherapy. -We talked about side effects of chemotherapy regimen in detail.  He will proceed with FOLFIRI with the same doses as before.  I plan to add bevacizumab if he is tolerating FOLFIRI very well.  2.  Pelvic pain: - This is barely controlled with fentanyl 200 mcg patch every 72 hours.  He is also using oxycodone 15 mg every 3-4 hours as needed for breakthrough pain.  3.  Recurrent infections: - Urine cultures grew Proteus Penneri and enterococcus faecalis. -She was treated with alternating Cipro and Bactrim.  Right now he is on Cipro.  4.  Microcytic anemia: -His MCV 79.9.  Hemoglobin dropped to 8.4. - I have recommended Feraheme weekly x2.  We discussed side effects.  Total time spent is 25 minutes with more than 50% of the time spent face-to-face discussing treatment plan, counseling and coordination of care.    Orders placed this encounter:  No orders of the defined types were placed in this encounter.     Derek Jack, MD [place] [phone]

## 2019-04-09 NOTE — Patient Instructions (Signed)
[facility] Discharge Instructions  You were seen today by Dr. Katragadda. He went over your recent lab results. He will see you back in 2 weeks for labs and follow up.   Thank you for choosing [facility] at [facility] to provide your oncology and hematology care.  To afford each patient quality time with our provider, please arrive at least 15 minutes before your scheduled appointment time.   If you have a lab appointment with the Cancer Center please come in thru the  Main Entrance and check in at the main information desk  You need to re-schedule your appointment should you arrive 10 or more minutes late.  We strive to give you quality time with our providers, and arriving late affects you and other patients whose appointments are after yours.  Also, if you no show three or more times for appointments you may be dismissed from the clinic at the providers discretion.     Again, thank you for choosing [facility].  Our hope is that these requests will decrease the amount of time that you wait before being seen by our physicians.       _____________________________________________________________  Should you have questions after your visit to [facility], please contact our office at [phone] between the hours of 8:00 a.m. and 4:30 p.m.  Voicemails left after 4:00 p.m. will not be returned until the following business day.  For prescription refill requests, have your pharmacy contact our office and allow 72 hours.    Cancer Center Support Programs:   > Cancer Support Group  2nd Tuesday of the month 1pm-2pm, Journey Room    

## 2019-04-09 NOTE — Assessment & Plan Note (Signed)
1.  Metastatic rectal adenocarcinoma, K-ras G 13 D, MS-stable: - Pelvic exenteration at [facility] clinic on 06/06/2018. -3 cycles of palliative chemotherapy with FOLFIRI from 10/22/2018 through 11/19/2018. -CT-guided drain placement by IR on 12/09/2018.  Left parasacral drain placed at [facility] in July of this year.  This is still draining about 100 mL/day of bloody fluid. - CTAP on 03/31/2019 shows left parasacral drain with left pelvic floor lesion/collection.  This has clearly progressed in the interval measuring 7.6 x 5.6 cm.  Right parasacral catheter tip is coiled in the presacral fluid collection which shows evidence of superior and anterior progression. - Because of the progression, I have recommended resuming chemotherapy. -We talked about side effects of chemotherapy regimen in detail.  He will proceed with FOLFIRI with the same doses as before.  I plan to add bevacizumab if he is tolerating FOLFIRI very well.  2.  Pelvic pain: - This is barely controlled with fentanyl 200 mcg patch every 72 hours.  He is also using oxycodone 15 mg every 3-4 hours as needed for breakthrough pain.  3.  Recurrent infections: - Urine cultures grew Proteus Penneri and enterococcus faecalis. -She was treated with alternating Cipro and Bactrim.  Right now he is on Cipro.  4.  Microcytic anemia: -His MCV 79.9.  Hemoglobin dropped to 8.4. - I have recommended Feraheme weekly x2.  We discussed side effects.

## 2019-04-09 NOTE — Patient Instructions (Signed)
[facility] Discharge Instructions for Patients Receiving Chemotherapy  Today you received the following chemotherapy agents   To help prevent nausea and vomiting after your treatment, we encourage you to take your nausea medication   If you develop nausea and vomiting that is not controlled by your nausea medication, call the clinic.   BELOW ARE SYMPTOMS THAT SHOULD BE REPORTED IMMEDIATELY:  *FEVER GREATER THAN 100.5 F  *CHILLS WITH OR WITHOUT FEVER  NAUSEA AND VOMITING THAT IS NOT CONTROLLED WITH YOUR NAUSEA MEDICATION  *UNUSUAL SHORTNESS OF BREATH  *UNUSUAL BRUISING OR BLEEDING  TENDERNESS IN MOUTH AND THROAT WITH OR WITHOUT PRESENCE OF ULCERS  *URINARY PROBLEMS  *BOWEL PROBLEMS  UNUSUAL RASH Items with * indicate a potential emergency and should be followed up as soon as possible.  Feel free to call the clinic should you have any questions or concerns. The clinic phone number is [phone].  Please show the CHEMO ALERT CARD at check-in to the Emergency Department and triage nurse.   

## 2019-04-09 NOTE — Progress Notes (Signed)
Pt presents today for treatment and f/u visit with Dr. Delton Coombes. VSS. Heart rate elevated on arrival at 107. Pt has complaints of ongoing pain he rates a 7. MAR reviewed. Fentanyl patch in place. Pt has no complaints of any significant changes since his last visit. Labs drawn on 04/08/2019 within parameters for treatment. Last UA negative for protein on 03-17-19.   Treatment given today per MD orders. Tolerated infusion without adverse affects. Vital signs stable. 5FU pump infusing per protocol. RUN noted on screen. Pt verbalized an understanding to return on Friday for pump d/c and Feraheme. New schedule given to patient. No complaints at this time. Discharged from clinic ambulatory. F/U with [facility] as scheduled.

## 2019-04-11 ENCOUNTER — Encounter (HOSPITAL_COMMUNITY): Payer: BC Managed Care – PPO

## 2019-04-11 ENCOUNTER — Encounter (HOSPITAL_COMMUNITY): Payer: Self-pay

## 2019-04-11 ENCOUNTER — Other Ambulatory Visit: Payer: Self-pay

## 2019-04-11 ENCOUNTER — Inpatient Hospital Stay (HOSPITAL_COMMUNITY): Payer: BC Managed Care – PPO

## 2019-04-11 VITALS — BP 92/59 | HR 116 | Temp 96.9°F | Resp 18

## 2019-04-11 DIAGNOSIS — D649 Anemia, unspecified: Secondary | ICD-10-CM

## 2019-04-11 DIAGNOSIS — C20 Malignant neoplasm of rectum: Secondary | ICD-10-CM

## 2019-04-11 MED ORDER — METHYLPREDNISOLONE SODIUM SUCC 125 MG IJ SOLR
125.0000 mg | Freq: Once | INTRAMUSCULAR | Status: AC
  Administered 2019-04-11: 125 mg via INTRAVENOUS
  Filled 2019-04-11: qty 2

## 2019-04-11 MED ORDER — SODIUM CHLORIDE 0.9 % IV SOLN
510.0000 mg | Freq: Once | INTRAVENOUS | Status: AC
  Administered 2019-04-11: 510 mg via INTRAVENOUS
  Filled 2019-04-11: qty 510

## 2019-04-11 MED ORDER — DIPHENHYDRAMINE HCL 25 MG PO CAPS
25.0000 mg | ORAL_CAPSULE | Freq: Once | ORAL | Status: AC
  Administered 2019-04-11: 25 mg via ORAL
  Filled 2019-04-11: qty 1

## 2019-04-11 MED ORDER — HEPARIN SOD (PORK) LOCK FLUSH 100 UNIT/ML IV SOLN
500.0000 [IU] | Freq: Once | INTRAVENOUS | Status: AC | PRN
  Administered 2019-04-11: 13:00:00 500 [IU]

## 2019-04-11 MED ORDER — SODIUM CHLORIDE 0.9 % IV SOLN
Freq: Once | INTRAVENOUS | Status: AC
  Administered 2019-04-11: 12:00:00 via INTRAVENOUS

## 2019-04-11 MED ORDER — SODIUM CHLORIDE 0.9 % IV SOLN: Freq: Once | INTRAVENOUS | Status: DC

## 2019-04-11 MED ORDER — SODIUM CHLORIDE 0.9% FLUSH
10.0000 mL | INTRAVENOUS | Status: DC | PRN
  Administered 2019-04-11 (×2): 10 mL
  Filled 2019-04-11 (×2): qty 10

## 2019-04-11 NOTE — Progress Notes (Signed)
To treatment room for first iron infusion and pump disconnect.  Reviewed patients vital signs with complaints of nausea, fatigue, weakness, and SOB with Reynolds Bowl, NP.  Patient to receive premeds Benadryl 25 mg by mouth and Solumedrol 125 mg IV for iron infusion and to receive Normal Saline 560ml over one hour verbal order Reynolds Bowl, NP.    Patient tolerated hydration with no complaints voiced. PICC line insertion site clean and dry with no bruising or swelling noted.  Denied pain with flushes.  Stated he felt better and ok to go home.  VSs with discharge and left ambulatory with no s/s of distress noted.

## 2019-04-17 ENCOUNTER — Other Ambulatory Visit (HOSPITAL_COMMUNITY): Payer: Self-pay | Admitting: *Deleted

## 2019-04-17 MED ORDER — CIPROFLOXACIN HCL 500 MG PO TABS: 500.0000 mg | ORAL_TABLET | Freq: Two times a day (BID) | ORAL | 0 refills | Status: DC

## 2019-04-17 MED ORDER — FENTANYL 100 MCG/HR TD PT72: 2.0000 | MEDICATED_PATCH | TRANSDERMAL | 0 refills | Status: DC

## 2019-04-21 ENCOUNTER — Other Ambulatory Visit (HOSPITAL_COMMUNITY): Payer: Self-pay | Admitting: *Deleted

## 2019-04-21 ENCOUNTER — Other Ambulatory Visit (HOSPITAL_COMMUNITY): Payer: Self-pay | Admitting: Hematology

## 2019-04-21 DIAGNOSIS — K6289 Other specified diseases of anus and rectum: Secondary | ICD-10-CM

## 2019-04-21 DIAGNOSIS — F329 Major depressive disorder, single episode, unspecified: Secondary | ICD-10-CM

## 2019-04-21 DIAGNOSIS — C20 Malignant neoplasm of rectum: Secondary | ICD-10-CM

## 2019-04-21 DIAGNOSIS — F32A Depression, unspecified: Secondary | ICD-10-CM

## 2019-04-22 ENCOUNTER — Other Ambulatory Visit: Payer: Self-pay

## 2019-04-22 ENCOUNTER — Inpatient Hospital Stay (HOSPITAL_COMMUNITY): Payer: BC Managed Care – PPO | Attending: Hematology

## 2019-04-22 ENCOUNTER — Other Ambulatory Visit (HOSPITAL_COMMUNITY): Payer: Self-pay

## 2019-04-22 DIAGNOSIS — C20 Malignant neoplasm of rectum: Secondary | ICD-10-CM | POA: Insufficient documentation

## 2019-04-22 DIAGNOSIS — Z5111 Encounter for antineoplastic chemotherapy: Secondary | ICD-10-CM | POA: Insufficient documentation

## 2019-04-22 DIAGNOSIS — G4700 Insomnia, unspecified: Secondary | ICD-10-CM | POA: Insufficient documentation

## 2019-04-22 DIAGNOSIS — Z79899 Other long term (current) drug therapy: Secondary | ICD-10-CM | POA: Diagnosis not present

## 2019-04-22 DIAGNOSIS — R5383 Other fatigue: Secondary | ICD-10-CM | POA: Insufficient documentation

## 2019-04-22 DIAGNOSIS — R0602 Shortness of breath: Secondary | ICD-10-CM | POA: Insufficient documentation

## 2019-04-22 DIAGNOSIS — D649 Anemia, unspecified: Secondary | ICD-10-CM | POA: Insufficient documentation

## 2019-04-22 DIAGNOSIS — R110 Nausea: Secondary | ICD-10-CM | POA: Insufficient documentation

## 2019-04-22 DIAGNOSIS — G893 Neoplasm related pain (acute) (chronic): Secondary | ICD-10-CM | POA: Diagnosis not present

## 2019-04-22 DIAGNOSIS — Z923 Personal history of irradiation: Secondary | ICD-10-CM | POA: Diagnosis not present

## 2019-04-22 DIAGNOSIS — Z9221 Personal history of antineoplastic chemotherapy: Secondary | ICD-10-CM | POA: Insufficient documentation

## 2019-04-22 DIAGNOSIS — Z5112 Encounter for antineoplastic immunotherapy: Secondary | ICD-10-CM | POA: Insufficient documentation

## 2019-04-22 LAB — COMPREHENSIVE METABOLIC PANEL
ALT: 30 U/L (ref 0–44)
AST: 23 U/L (ref 15–41)
Albumin: 2.7 g/dL — ABNORMAL LOW (ref 3.5–5.0)
Alkaline Phosphatase: 192 U/L — ABNORMAL HIGH (ref 38–126)
Anion gap: 15 (ref 5–15)
BUN: 17 mg/dL (ref 6–20)
CO2: 18 mmol/L — ABNORMAL LOW (ref 22–32)
Calcium: 8.5 mg/dL — ABNORMAL LOW (ref 8.9–10.3)
Chloride: 98 mmol/L (ref 98–111)
Creatinine, Ser: 1.42 mg/dL — ABNORMAL HIGH (ref 0.61–1.24)
GFR calc Af Amer: >60 mL/min (ref >60)
GFR calc non Af Amer: 56 mL/min — ABNORMAL LOW (ref >60)
Glucose, Bld: 249 mg/dL — ABNORMAL HIGH (ref 70–99)
Potassium: 3.5 mmol/L (ref 3.5–5.1)
Sodium: 131 mmol/L — ABNORMAL LOW (ref 135–145)
Total Bilirubin: 0.2 mg/dL — ABNORMAL LOW (ref 0.3–1.2)
Total Protein: 7.4 g/dL (ref 6.5–8.1)

## 2019-04-22 LAB — CBC WITH DIFFERENTIAL/PLATELET
Abs Immature Granulocytes: 0.48 10*3/uL — ABNORMAL HIGH (ref 0.00–0.07)
Basophils Absolute: 0.1 10*3/uL (ref 0.0–0.1)
Basophils Relative: 1 %
Eosinophils Absolute: 1.1 10*3/uL — ABNORMAL HIGH (ref 0.0–0.5)
Eosinophils Relative: 7 %
HCT: 29.5 % — ABNORMAL LOW (ref 39.0–52.0)
Hemoglobin: 8.8 g/dL — ABNORMAL LOW (ref 13.0–17.0)
Immature Granulocytes: 3 %
Lymphocytes Relative: 14 %
Lymphs Abs: 2.3 10*3/uL (ref 0.7–4.0)
MCH: 23.7 pg — ABNORMAL LOW (ref 26.0–34.0)
MCHC: 29.8 g/dL — ABNORMAL LOW (ref 30.0–36.0)
MCV: 79.5 fL — ABNORMAL LOW (ref 80.0–100.0)
Monocytes Absolute: 1.9 10*3/uL — ABNORMAL HIGH (ref 0.1–1.0)
Monocytes Relative: 11 %
Neutro Abs: 11.1 10*3/uL — ABNORMAL HIGH (ref 1.7–7.7)
Neutrophils Relative %: 64 %
Platelets: 504 10*3/uL — ABNORMAL HIGH (ref 150–400)
RBC: 3.71 MIL/uL — ABNORMAL LOW (ref 4.22–5.81)
RDW: 18.7 % — ABNORMAL HIGH (ref 11.5–15.5)
WBC: 17 10*3/uL — ABNORMAL HIGH (ref 4.0–10.5)
nRBC: 0 % (ref 0.0–0.2)

## 2019-04-22 LAB — URINALYSIS, DIPSTICK ONLY
Bilirubin Urine: NEGATIVE
Glucose, UA: NEGATIVE mg/dL
Hgb urine dipstick: NEGATIVE
Ketones, ur: NEGATIVE mg/dL
Nitrite: NEGATIVE
Protein, ur: 30 mg/dL — AB
Specific Gravity, Urine: 1.016 (ref 1.005–1.030)
pH: 6 (ref 5.0–8.0)

## 2019-04-22 LAB — MAGNESIUM: Magnesium: 1.6 mg/dL — ABNORMAL LOW (ref 1.7–2.4)

## 2019-04-22 MED ORDER — OXYCODONE HCL 10 MG PO TABS: 15.0000 mg | ORAL_TABLET | ORAL | 0 refills | Status: DC | PRN

## 2019-04-22 MED ORDER — SODIUM CHLORIDE 0.9% FLUSH
10.0000 mL | Freq: Once | INTRAVENOUS | Status: AC
  Administered 2019-04-22: 10 mL via INTRAVENOUS

## 2019-04-22 MED ORDER — HEPARIN SOD (PORK) LOCK FLUSH 100 UNIT/ML IV SOLN
500.0000 [IU] | Freq: Once | INTRAVENOUS | Status: AC
  Administered 2019-04-22: 500 [IU] via INTRAVENOUS

## 2019-04-23 ENCOUNTER — Inpatient Hospital Stay (HOSPITAL_COMMUNITY): Payer: BC Managed Care – PPO

## 2019-04-23 ENCOUNTER — Inpatient Hospital Stay (HOSPITAL_COMMUNITY): Payer: BC Managed Care – PPO | Admitting: Hematology

## 2019-04-23 ENCOUNTER — Other Ambulatory Visit: Payer: Self-pay

## 2019-04-23 VITALS — BP 93/56 | HR 83 | Temp 96.8°F | Resp 18

## 2019-04-23 DIAGNOSIS — C20 Malignant neoplasm of rectum: Secondary | ICD-10-CM | POA: Diagnosis not present

## 2019-04-23 DIAGNOSIS — D649 Anemia, unspecified: Secondary | ICD-10-CM

## 2019-04-23 MED ORDER — SODIUM CHLORIDE 0.9 % IV SOLN
423.0000 mg/m2 | Freq: Once | INTRAVENOUS | Status: AC
  Administered 2019-04-23: 12:00:00 800 mg via INTRAVENOUS
  Filled 2019-04-23: qty 35

## 2019-04-23 MED ORDER — SODIUM CHLORIDE 0.9 % IV SOLN
180.0000 mg/m2 | Freq: Once | INTRAVENOUS | Status: AC
  Administered 2019-04-23: 340 mg via INTRAVENOUS
  Filled 2019-04-23: qty 2

## 2019-04-23 MED ORDER — SULFAMETHOXAZOLE-TRIMETHOPRIM 800-160 MG PO TABS: 1.0000 | ORAL_TABLET | Freq: Two times a day (BID) | ORAL | 0 refills | Status: DC

## 2019-04-23 MED ORDER — SODIUM CHLORIDE 0.9 % IV SOLN
2400.0000 mg/m2 | INTRAVENOUS | Status: DC
  Administered 2019-04-23: 4550 mg via INTRAVENOUS
  Filled 2019-04-23: qty 91

## 2019-04-23 MED ORDER — PALONOSETRON HCL INJECTION 0.25 MG/5ML
0.2500 mg | Freq: Once | INTRAVENOUS | Status: AC
  Administered 2019-04-23: 0.25 mg via INTRAVENOUS
  Filled 2019-04-23: qty 5

## 2019-04-23 MED ORDER — SODIUM CHLORIDE 0.9 % IV SOLN
10.0000 mg | Freq: Once | INTRAVENOUS | Status: AC
  Administered 2019-04-23: 10 mg via INTRAVENOUS
  Filled 2019-04-23: qty 10

## 2019-04-23 MED ORDER — SODIUM CHLORIDE 0.9 % IV SOLN
5.0000 mg/kg | Freq: Once | INTRAVENOUS | Status: AC
  Administered 2019-04-23: 400 mg via INTRAVENOUS
  Filled 2019-04-23: qty 16

## 2019-04-23 MED ORDER — ATROPINE SULFATE 1 MG/ML IJ SOLN
0.5000 mg | Freq: Once | INTRAMUSCULAR | Status: AC
  Administered 2019-04-23: 0.5 mg via INTRAVENOUS
  Filled 2019-04-23: qty 1

## 2019-04-23 MED ORDER — FLUOROURACIL CHEMO INJECTION 2.5 GM/50ML
400.0000 mg/m2 | Freq: Once | INTRAVENOUS | Status: AC
  Administered 2019-04-23: 14:00:00 750 mg via INTRAVENOUS
  Filled 2019-04-23: qty 15

## 2019-04-23 MED ORDER — SODIUM CHLORIDE 0.9 % IV SOLN
Freq: Once | INTRAVENOUS | Status: AC
  Administered 2019-04-23: 10:00:00 via INTRAVENOUS

## 2019-04-23 MED ORDER — SODIUM CHLORIDE 0.9 % IV SOLN
Freq: Once | INTRAVENOUS | Status: AC
  Administered 2019-04-23: 12:00:00 via INTRAVENOUS

## 2019-04-23 MED ORDER — SODIUM CHLORIDE 0.9% FLUSH
10.0000 mL | INTRAVENOUS | Status: DC | PRN
  Administered 2019-04-23: 10 mL
  Filled 2019-04-23: qty 10

## 2019-04-23 MED ORDER — SODIUM CHLORIDE 0.9 % IV SOLN
510.0000 mg | Freq: Once | INTRAVENOUS | Status: AC
  Administered 2019-04-23: 11:00:00 510 mg via INTRAVENOUS
  Filled 2019-04-23: qty 17

## 2019-04-23 NOTE — Progress Notes (Signed)
[place] [place], [place] [address]   CLINIC:  Medical Oncology/Hematology  PCP:  Deland Pretty, [place] [address] [phone]   REASON FOR VISIT:  Follow-up for rectal adenocarcinoma   CURRENT THERAPY:FOLFIRI chemotherapy.   BRIEF ONCOLOGIC HISTORY:  Oncology History Overview Note  Stage IIIB rectal adenocarcinoma, mT2N2M0 Treated with 5Gy x 5 at [facility]   Rectal cancer [facility])  09/20/2015 Procedure   colonoscopy with firm rectal mass and 4 cm polyp in proximal rectum, infiltrating non-obstructing large mass within the distal rectum extending towards anal verge   09/20/2015 Miscellaneous   Microsatellite stable.    09/28/2015 Imaging   CT C/A/P no evidence of distant metastatic disease. irregular thickening of the low rectum and anal canal, several enlarged perirectal LN, indeterminate 34m RUL nodule   09/28/2015 Imaging   MRI pelvis assym diffuse circum lobular thickening of rectum, near full wall thickness extension along lower anterior rectum W/O extra serosal extension c/w known adeno, mult. perirectal LN   12/06/2015 - 12/10/2015 Radiation Therapy   [facility] Dr. AAngelina Ok  2500.00 cGy, 500.00 cGy per day in 5 fractions delivered 1x per day to the 96.0% Isodose line Treatment Dates: 12/06/2015 through 12/10/2015.    01/03/2016 Procedure   PICC placed   01/04/2016 - 03/14/2016 Chemotherapy   FOLFOX x 6 cycles   06/07/2016 Definitive Surgery   Robotic assisted APR with coloanal anastomosis and DLI [facility]  06/19/2016 - 06/23/2016 Hospital Admission   Admit date: 06/19/2016 Admission diagnosis: Sepsis Additional comments: Sepsis due to multiple gram-negative active uremia. Likely source perirectal infection [facility], general surgery following, perirectal abscess draining by itself, white count and temperatures improved.  He had a PICC line from prior to this admission which was line removed on 06/21/2016, vancomycin stopped on 06/22/2016, He tolerated test dose of Rocephin well on 06/22/2016 without any issues, [facility] data, requested to follow with PCP in 3 days to make sure he is tolerating oral medications well and has continued to defervesce, request PCP to please check final culture and sensitivity results which should be back in 3 days. He will follow with general surgery outpatient as well.   02/15/2017 Imaging   CT CAP- 1. Well-formed presacral abscess is decreased in size but persistent. 2. No evidence of colorectal cancer metastasis in the abdomen pelvis. 3. Loop ileostomy in the RIGHT lower quadrant.   04/05/2017 Imaging   CT CAP-IMPRESSION: 1. Status post loop right-sided ileostomy with sigmoid to anal anastomosis. 2. Decrease in presacral fluid collection, consistent with abscess. New foci of extraluminal gas, suspicious for fistulous communication to the lower sigmoid. 3. No evidence of metastatic disease.   04/20/2017 Imaging   MRI pelvis w/ and w/o contrast IMPRESSION: 1. No appreciable change in chronic complex presacral space abscess, which demonstrates four separate sites of fistulous communication to the sigmoid colon and colo-anal anastomosis, as detailed. No discrete mass or other findings to suggest local tumor recurrence. 2. No evidence of metastatic disease in the pelvis.   10/11/2018 Cancer Staging   Staging form: Colon and Rectum, AJCC 7th Edition - Pathologic: Stage IVB (TX, N1, M1b) - Signed by KDerek Jack MD on 10/11/2018   10/22/2018 -  Chemotherapy   The patient had palonosetron (ALOXI) injection 0.25 mg, 0.25 mg, Intravenous,  Once, 5 of 10 cycles Administration: 0.25 mg (10/22/2018), 0.25 mg (11/05/2018), 0.25 mg (11/19/2018), 0.25 mg (04/09/2019), 0.25 mg (04/23/2019) irinotecan (CAMPTOSAR) 340 mg in  dextrose 5 % 500 mL chemo infusion, 180 mg/m2 = 340 mg, Intravenous,  Once, 5 of 10 cycles Administration: 340 mg (10/22/2018), 340 mg (11/05/2018), 340 mg (11/19/2018), 340 mg (04/09/2019), 340 mg (04/23/2019) leucovorin 800 mg in dextrose 5 % 250 mL infusion, 756 mg, Intravenous,  Once, 5 of 10 cycles Administration: 800 mg (10/22/2018), 800 mg (11/05/2018), 800 mg (11/19/2018), 800 mg (04/09/2019), 800 mg (04/23/2019) fluorouracil (ADRUCIL) chemo injection 750 mg, 400 mg/m2 = 750 mg, Intravenous,  Once, 5 of 10 cycles Administration: 750 mg (10/22/2018), 750 mg (11/05/2018), 750 mg (11/19/2018), 750 mg (04/09/2019), 750 mg (04/23/2019) fluorouracil (ADRUCIL) 4,550 mg in sodium chloride 0.9 % 59 mL chemo infusion, 2,400 mg/m2 = 4,550 mg, Intravenous, 1 Day/Dose, 5 of 10 cycles Administration: 4,550 mg (10/22/2018), 4,550 mg (11/05/2018), 4,550 mg (11/19/2018), 4,550 mg (04/09/2019), 4,550 mg (04/23/2019) bevacizumab-bvzr (ZIRABEV) 400 mg in sodium chloride 0.9 % 100 mL chemo infusion, 5 mg/kg = 400 mg (100 % of original dose 5 mg/kg), Intravenous,  Once, 1 of 6 cycles Dose modification: 5 mg/kg (original dose 5 mg/kg, Cycle 5) Administration: 400 mg (04/23/2019)  for chemotherapy treatment.       CANCER STAGING: Cancer Staging Rectal cancer [facility]) Staging form: Colon and Rectum, AJCC 7th Edition - Clinical stage from 01/03/2016: Stage IIIB (T2, N2a, M0) - Signed by Baird Cancer, PA-C on 01/03/2016 - Pathologic: Stage IVB (TX, N1, M1b) - Signed by Derek Jack, MD on 10/11/2018    INTERVAL HISTORY:  Jonathan Wright 53 y.o. male seen for follow-up of metastatic rectal cancer.  He started FOLFIRI on 04/09/2019.  He had nausea for the last couple of weeks denied any vomiting.  He noticed black bowel movement in the colostomy bag for the last 1 week.  No abdominal cramping was reported.  He did not notice this any improvement with Cipro for his UTI.  Appetite is 75%.  Energy levels are 25%.  He reports that his pain  is not well controlled.  He is taking oxycodone 15 mg every 3 hours.  He is also using fentanyl patch 200 mcg every 72 hours.  Because of the pain, he is not able to sleep well.  Denies any fevers or chills.  REVIEW OF SYSTEMS:  Review of Systems  Constitutional: Positive for fatigue.  Respiratory: Positive for shortness of breath.   Musculoskeletal:       Pelvic pain present.  Psychiatric/Behavioral: Positive for sleep disturbance.  All other systems reviewed and are negative.    PAST MEDICAL/SURGICAL HISTORY:  Past Medical History:  Diagnosis Date  . Anxiety   . Depression   . Rectal cancer ([place]) 12/17/2015   Past Surgical History:  Procedure Laterality Date  . ABDOMINAL PERINEAL BOWEL RESECTION  06/07/2016   [facility]  . COLON SURGERY    . DIVERTING ILEOSTOMY  06/07/2016   [facility]  . INGUINAL LYMPH NODE BIOPSY Left 09/18/2018   Procedure: LEFT INGUINAL LYMPH NODE BIOPSY;  Surgeon: Aviva Signs, MD;  Location: [facility];  Service: General;  Laterality: Left;  . IR RADIOLOGIST EVAL & MGMT  12/25/2018  . LAPAROSCOPIC LOW ANTERIOR RESCECTION WITH COLOANAL ANASTOMOSIS  06/07/2016   [facility]  . PERIPHERALLY INSERTED CENTRAL CATHETER INSERTION  11/2015     SOCIAL HISTORY:  Social History   Socioeconomic History  . Marital status: Divorced  Spouse name: Not on file  . Number of children: Not on file  . Years of education: Not on file  . Highest education level: Not on file  Occupational History  . Not on file  Social Needs  . Financial resource strain: Not on file  . Food insecurity    Worry: Not on file    Inability: Not on file  . Transportation needs    Medical: Not on file    Non-medical: Not on file  Tobacco Use  . Smoking status: Never Smoker  . Smokeless tobacco: Never Used  Substance and Sexual Activity  . Alcohol use: Yes    Comment: occ.   . Drug use: No  . Sexual activity: Not on file  Lifestyle  . Physical activity    Days per week:  Not on file    Minutes per session: Not on file  . Stress: Not on file  Relationships  . Social Herbalist on phone: Not on file    Gets together: Not on file    Attends religious service: Not on file    Active member of club or organization: Not on file    Attends meetings of clubs or organizations: Not on file    Relationship status: Not on file  . Intimate partner violence    Fear of current or ex partner: Not on file    Emotionally abused: Not on file    Physically abused: Not on file    Forced sexual activity: Not on file  Other Topics Concern  . Not on file  Social History Narrative  . Not on file    FAMILY HISTORY:  Family History  Problem Relation Age of Onset  . Rectal cancer Neg Hx     CURRENT MEDICATIONS:  Outpatient Encounter Medications as of 04/23/2019  Medication Sig  . ciprofloxacin (CIPRO) 500 MG tablet Take 1 tablet (500 mg total) by mouth 2 (two) times daily.  . fentaNYL (DURAGESIC) 100 MCG/HR Place 2 patches onto the skin every 3 (three) days.  Marland Kitchen FLUoxetine (PROZAC) 40 MG capsule TAKE 1 CAPSULE(40 MG) BY MOUTH DAILY  . ondansetron (ZOFRAN ODT) 4 MG disintegrating tablet Take 1 tablet (4 mg total) by mouth every 8 (eight) hours as needed for nausea or vomiting.  . Oxycodone HCl 10 MG TABS Take 1.5 tablets (15 mg total) by mouth every 3 (three) hours as needed.  . ALPRAZolam (XANAX) 1 MG tablet Take 0.5 tablets (0.5 mg total) by mouth 2 (two) times daily as needed for anxiety. (Patient not taking: Reported on 04/23/2019)  . prochlorperazine (COMPAZINE) 10 MG tablet Take 1 tablet (10 mg total) by mouth every 6 (six) hours as needed for nausea or vomiting. (Patient not taking: Reported on 04/23/2019)  . sulfamethoxazole-trimethoprim (BACTRIM DS) 800-160 MG tablet Take 1 tablet by mouth 2 (two) times daily.   No facility-administered encounter medications on file as of 04/23/2019.     ALLERGIES:  No Known Allergies   PHYSICAL EXAM:  ECOG Performance  status: 1  Blood pressure is 104/74.  Pulse rate is 120.  Respiratory rate is 22.  Temperature 98.  Oxygen saturations 100%.  Physical Exam Constitutional:      Appearance: Normal appearance.  Cardiovascular:     Rate and Rhythm: Normal rate and regular rhythm.     Heart sounds: Normal heart sounds.  Pulmonary:     Effort: Pulmonary effort is normal.     Breath sounds: Normal breath sounds.  Abdominal:     Palpations: Abdomen is soft.  Musculoskeletal:        General: No swelling.  Skin:    General: Skin is warm.  Neurological:     General: No focal deficit present.     Mental Status: He is alert and oriented to person, place, and time.  Psychiatric:        Mood and Affect: Mood normal.        Behavior: Behavior normal.    Drain has serosanguineous fluid.  LABORATORY DATA:  I have reviewed the labs as listed.  CBC    Component Value Date/Time   WBC 17.0 (H) 04/22/2019 1529   RBC 3.71 (L) 04/22/2019 1529   HGB 8.8 (L) 04/22/2019 1529   HCT 29.5 (L) 04/22/2019 1529   PLT 504 (H) 04/22/2019 1529   MCV 79.5 (L) 04/22/2019 1529   MCH 23.7 (L) 04/22/2019 1529   MCHC 29.8 (L) 04/22/2019 1529   RDW 18.7 (H) 04/22/2019 1529   LYMPHSABS 2.3 04/22/2019 1529   MONOABS 1.9 (H) 04/22/2019 1529   EOSABS 1.1 (H) 04/22/2019 1529   BASOSABS 0.1 04/22/2019 1529   CMP Latest Ref Rng & Units 04/22/2019 04/08/2019 03/17/2019  Glucose 70 - 99 mg/dL 249(H) 168(H) 157(H)  BUN 6 - 20 mg/dL 17 18 24(H)  Creatinine 0.61 - 1.24 mg/dL 1.42(H) 1.42(H) 1.53(H)  Sodium 135 - 145 mmol/L 131(L) 135 136  Potassium 3.5 - 5.1 mmol/L 3.5 3.9 4.2  Chloride 98 - 111 mmol/L 98 101 101  CO2 22 - 32 mmol/L 18(L) 24 24  Calcium 8.9 - 10.3 mg/dL 8.5(L) 8.9 9.3  Total Protein 6.5 - 8.1 g/dL 7.4 7.9 8.3(H)  Total Bilirubin 0.3 - 1.2 mg/dL 0.2(L) 0.3 0.1(L)  Alkaline Phos 38 - 126 U/L 192(H) 137(H) 174(H)  AST 15 - 41 U/L 23 21 18   ALT 0 - 44 U/L 30 19 31        DIAGNOSTIC IMAGING:  I have  independently reviewed the scans and discussed with the patient.   I have reviewed Venita Lick LPN's note and agree with the documentation.  I personally performed a face-to-face visit, made revisions and my assessment and plan is as follows.    ASSESSMENT & PLAN:   Rectal cancer ([place]) 1.  Metastatic rectal adenocarcinoma, K-ras G 13 D, MS-stable: - Pelvic exenteration at [facility] clinic on 06/06/2018. -3 cycles of palliative chemotherapy with FOLFIRI from 10/22/2018 through 11/19/2018. -CT-guided drain placement by IR on 12/09/2018.  Left parasacral drain placed at [facility] in July of this year.  This is still draining about 100 mL/day of bloody fluid. - CTAP on 03/31/2019 shows left parasacral drain with left pelvic floor lesion/collection.  This has clearly progressed in the interval measuring 7.6 x 5.6 cm.  Right parasacral catheter tip is coiled in the presacral fluid collection which shows evidence of superior and anterior progression. - Because of progression, I have recommended restarting chemotherapy. - He received cycle 4 on 04/09/2019.  He tolerated chemotherapy very well.  Pain was his main problem. - He reported bowel movements being black for 1 week into the bag.  Hence I would not add bevacizumab at this time. - He will proceed with his treatment today.  I have reviewed his labs. -We will see him back in 2 weeks for follow-up.  2.  Pelvic pain: - He is using fentanyl 200 mcg patch. -He is also taking oxycodone 15 mg every 3 hours as needed for breakthrough which is not  helping. - I have told him to increase it to oxycodone 20 mg every 3 hours.  He will use up his pills.  I will give a prescription when he runs out of pills.  3.  Recurrent infections: - Urine cultures grew Proteus Penneri and enterococcus faecalis. -He is taking Cipro.  However he does not report any improvement in the urine.  I have sent a prescription for Bactrim.  4.  Microcytic anemia: -He received Feraheme  on 04/11/2019 and 04/23/2019. - His hemoglobin improved to 8.8 today.  Total time spent is 40 minutes with more than 50% of the time spent face-to-face discussing treatment changes, counseling and coordination of care.    Orders placed this encounter:  No orders of the defined types were placed in this encounter.     Derek Jack, MD [place] [phone]

## 2019-04-23 NOTE — Progress Notes (Signed)
[place] vital signs reviewed with and pt seen by Dr. Delton Coombes and pt approved for chemo tx today per MD        Jonathan Wright tolerated chemo tx and Feraheme infusion well without complaints or incident. Pt discharged with 5FU pump infusing without issues. VSS upon discharge. Pt discharged via wheelchair in satisfactory condition

## 2019-04-23 NOTE — Patient Instructions (Addendum)
[facility] Discharge Instructions for Patients Receiving Chemotherapy   Beginning January 23rd 2017 lab work for the [facility] will be done in the  Main lab at [facility] on 1st floor. If you have a lab appointment with the [facility] please come in thru the  Main Entrance and check in at the main information desk   Today you received the following chemotherapy agents Avastin,Irinotecan,Leucovorin and 5FU as well as Feraheme infusion. Follow-up as scheduled. Call clinic for any questions or concerns  To help prevent nausea and vomiting after your treatment, we encourage you to take your nausea medication   If you develop nausea and vomiting, or diarrhea that is not controlled by your medication, call the clinic.  The clinic phone number is [phone]. Office hours are Monday-Friday 8:30am-5:00pm.  BELOW ARE SYMPTOMS THAT SHOULD BE REPORTED IMMEDIATELY:  *FEVER GREATER THAN 101.0 F  *CHILLS WITH OR WITHOUT FEVER  NAUSEA AND VOMITING THAT IS NOT CONTROLLED WITH YOUR NAUSEA MEDICATION  *UNUSUAL SHORTNESS OF BREATH  *UNUSUAL BRUISING OR BLEEDING  TENDERNESS IN MOUTH AND THROAT WITH OR WITHOUT PRESENCE OF ULCERS  *URINARY PROBLEMS  *BOWEL PROBLEMS  UNUSUAL RASH Items with * indicate a potential emergency and should be followed up as soon as possible. If you have an emergency after office hours please contact your primary care physician or go to the nearest emergency department.  Please call the clinic during office hours if you have any questions or concerns.   You may also contact the Patient Navigator at [phone] should you have any questions or need assistance in obtaining follow up care.      Resources For Cancer Patients and their Caregivers ? American Cancer Society: Can assist with transportation, wigs, general needs, runs Look Good Feel Better.        717 419 8835 ? Cancer Care: Provides financial assistance, online support  groups, medication/co-pay assistance.  1-800-813-HOPE 224 139 3834) ? [facility] Assists [place] cancer patients and their families through emotional , educational and financial support.  (435)278-2457 ? [place] DSS Where to apply for food stamps, Medicaid and utility assistance. 630 473 4577 ? [facility]: Transportation to medical appointments. 7623781726 ? Social Security Administration: May apply for disability if have a Stage IV cancer. (438)237-8840 575-574-8130 ? [employer], Disability and Transit Services: Assists with nutrition, care and transit needs. (520) 284-1162

## 2019-04-23 NOTE — Patient Instructions (Addendum)
[facility] at [facility] Discharge Instructions  You were seen today by Dr. Katragadda. He went over your recent lab results. He will see you back in 2 weeks for labs and follow up.   Thank you for choosing [facility] at [facility] to provide your oncology and hematology care.  To afford each patient quality time with our provider, please arrive at least 15 minutes before your scheduled appointment time.   If you have a lab appointment with the Cancer Center please come in thru the  Main Entrance and check in at the main information desk  You need to re-schedule your appointment should you arrive 10 or more minutes late.  We strive to give you quality time with our providers, and arriving late affects you and other patients whose appointments are after yours.  Also, if you no show three or more times for appointments you may be dismissed from the clinic at the providers discretion.     Again, thank you for choosing [facility].  Our hope is that these requests will decrease the amount of time that you wait before being seen by our physicians.       _____________________________________________________________  Should you have questions after your visit to [facility], please contact our office at [phone] between the hours of 8:00 a.m. and 4:30 p.m.  Voicemails left after 4:00 p.m. will not be returned until the following business day.  For prescription refill requests, have your pharmacy contact our office and allow 72 hours.    Cancer Center Support Programs:   > Cancer Support Group  2nd Tuesday of the month 1pm-2pm, Journey Room    

## 2019-04-23 NOTE — Progress Notes (Signed)
.  The following biosimilar bevacizumab-bvzr (Zirabev) has been selected for use in this patient.  Gelila Well, PharmD  

## 2019-04-25 ENCOUNTER — Inpatient Hospital Stay (HOSPITAL_COMMUNITY): Payer: BC Managed Care – PPO

## 2019-04-25 ENCOUNTER — Other Ambulatory Visit: Payer: Self-pay

## 2019-04-25 NOTE — Progress Notes (Signed)
Patients caregiver d/c'd the chemo pump at home and brought the pump to the cancer center.  The caregiver stated the patient did not feel like coming in to have his pumped stopped.

## 2019-04-26 ENCOUNTER — Encounter (HOSPITAL_COMMUNITY): Payer: Self-pay | Admitting: Hematology

## 2019-04-26 NOTE — Assessment & Plan Note (Signed)
1.  Metastatic rectal adenocarcinoma, K-ras G 13 D, MS-stable: - Pelvic exenteration at [facility] clinic on 06/06/2018. -3 cycles of palliative chemotherapy with FOLFIRI from 10/22/2018 through 11/19/2018. -CT-guided drain placement by IR on 12/09/2018.  Left parasacral drain placed at [facility] in July of this year.  This is still draining about 100 mL/day of bloody fluid. - CTAP on 03/31/2019 shows left parasacral drain with left pelvic floor lesion/collection.  This has clearly progressed in the interval measuring 7.6 x 5.6 cm.  Right parasacral catheter tip is coiled in the presacral fluid collection which shows evidence of superior and anterior progression. - Because of progression, I have recommended restarting chemotherapy. - He received cycle 4 on 04/09/2019.  He tolerated chemotherapy very well.  Pain was his main problem. - He reported bowel movements being black for 1 week into the bag.  Hence I would not add bevacizumab at this time. - He will proceed with his treatment today.  I have reviewed his labs. -We will see him back in 2 weeks for follow-up.  2.  Pelvic pain: - He is using fentanyl 200 mcg patch. -He is also taking oxycodone 15 mg every 3 hours as needed for breakthrough which is not helping. - I have told him to increase it to oxycodone 20 mg every 3 hours.  He will use up his pills.  I will give a prescription when he runs out of pills.  3.  Recurrent infections: - Urine cultures grew Proteus Penneri and enterococcus faecalis. -He is taking Cipro.  However he does not report any improvement in the urine.  I have sent a prescription for Bactrim.  4.  Microcytic anemia: -He received Feraheme on 04/11/2019 and 04/23/2019. - His hemoglobin improved to 8.8 today.

## 2019-05-05 ENCOUNTER — Other Ambulatory Visit (HOSPITAL_COMMUNITY): Payer: Self-pay | Admitting: *Deleted

## 2019-05-05 DIAGNOSIS — K6289 Other specified diseases of anus and rectum: Secondary | ICD-10-CM

## 2019-05-05 DIAGNOSIS — C20 Malignant neoplasm of rectum: Secondary | ICD-10-CM

## 2019-05-05 MED ORDER — OXYCODONE HCL 20 MG PO TABS: 1.0000 | ORAL_TABLET | ORAL | 0 refills | Status: DC | PRN

## 2019-05-05 MED ORDER — FENTANYL 100 MCG/HR TD PT72: 2.0000 | MEDICATED_PATCH | TRANSDERMAL | 0 refills | Status: DC

## 2019-05-05 NOTE — Telephone Encounter (Signed)
Change in dosage of pain medication per Dr. Tomie China last office note.

## 2019-05-06 ENCOUNTER — Inpatient Hospital Stay (HOSPITAL_COMMUNITY): Payer: BC Managed Care – PPO

## 2019-05-06 ENCOUNTER — Other Ambulatory Visit (HOSPITAL_COMMUNITY): Payer: Self-pay | Admitting: *Deleted

## 2019-05-06 DIAGNOSIS — K6289 Other specified diseases of anus and rectum: Secondary | ICD-10-CM

## 2019-05-06 MED ORDER — ALPRAZOLAM 1 MG PO TABS: 0.5000 mg | ORAL_TABLET | Freq: Two times a day (BID) | ORAL | 0 refills | Status: DC | PRN

## 2019-05-07 ENCOUNTER — Inpatient Hospital Stay (HOSPITAL_COMMUNITY): Payer: BC Managed Care – PPO | Admitting: Hematology

## 2019-05-07 ENCOUNTER — Inpatient Hospital Stay (HOSPITAL_COMMUNITY): Payer: BC Managed Care – PPO

## 2019-05-09 ENCOUNTER — Encounter (HOSPITAL_COMMUNITY): Payer: BC Managed Care – PPO

## 2019-05-12 ENCOUNTER — Other Ambulatory Visit: Payer: Self-pay

## 2019-05-12 ENCOUNTER — Ambulatory Visit (HOSPITAL_COMMUNITY): Payer: BC Managed Care – PPO | Admitting: Hematology

## 2019-05-12 ENCOUNTER — Inpatient Hospital Stay (HOSPITAL_COMMUNITY): Payer: BC Managed Care – PPO

## 2019-05-12 ENCOUNTER — Encounter (HOSPITAL_COMMUNITY): Payer: Self-pay | Admitting: *Deleted

## 2019-05-12 DIAGNOSIS — C20 Malignant neoplasm of rectum: Secondary | ICD-10-CM | POA: Diagnosis not present

## 2019-05-12 LAB — COMPREHENSIVE METABOLIC PANEL
ALT: 38 U/L (ref 0–44)
AST: 28 U/L (ref 15–41)
Albumin: 2.5 g/dL — ABNORMAL LOW (ref 3.5–5.0)
Alkaline Phosphatase: 234 U/L — ABNORMAL HIGH (ref 38–126)
Anion gap: 15 (ref 5–15)
BUN: 18 mg/dL (ref 6–20)
CO2: 20 mmol/L — ABNORMAL LOW (ref 22–32)
Calcium: 9 mg/dL (ref 8.9–10.3)
Chloride: 98 mmol/L (ref 98–111)
Creatinine, Ser: 1.24 mg/dL (ref 0.61–1.24)
GFR calc Af Amer: >60 mL/min (ref >60)
GFR calc non Af Amer: >60 mL/min (ref >60)
Glucose, Bld: 159 mg/dL — ABNORMAL HIGH (ref 70–99)
Potassium: 4.2 mmol/L (ref 3.5–5.1)
Sodium: 133 mmol/L — ABNORMAL LOW (ref 135–145)
Total Bilirubin: 0.3 mg/dL (ref 0.3–1.2)
Total Protein: 7.3 g/dL (ref 6.5–8.1)

## 2019-05-12 LAB — CBC WITH DIFFERENTIAL/PLATELET
Abs Immature Granulocytes: 2.95 10*3/uL — ABNORMAL HIGH (ref 0.00–0.07)
Basophils Absolute: 0.1 10*3/uL (ref 0.0–0.1)
Basophils Relative: 0 %
Eosinophils Absolute: 0.5 10*3/uL (ref 0.0–0.5)
Eosinophils Relative: 2 %
HCT: 31.3 % — ABNORMAL LOW (ref 39.0–52.0)
Hemoglobin: 9 g/dL — ABNORMAL LOW (ref 13.0–17.0)
Immature Granulocytes: 12 %
Lymphocytes Relative: 11 %
Lymphs Abs: 2.7 10*3/uL (ref 0.7–4.0)
MCH: 24.2 pg — ABNORMAL LOW (ref 26.0–34.0)
MCHC: 28.8 g/dL — ABNORMAL LOW (ref 30.0–36.0)
MCV: 84.1 fL (ref 80.0–100.0)
Monocytes Absolute: 2.5 10*3/uL — ABNORMAL HIGH (ref 0.1–1.0)
Monocytes Relative: 10 %
Neutro Abs: 15.6 10*3/uL — ABNORMAL HIGH (ref 1.7–7.7)
Neutrophils Relative %: 65 %
Platelets: 668 10*3/uL — ABNORMAL HIGH (ref 150–400)
RBC: 3.72 MIL/uL — ABNORMAL LOW (ref 4.22–5.81)
RDW: 24.2 % — ABNORMAL HIGH (ref 11.5–15.5)
WBC: 24.3 10*3/uL — ABNORMAL HIGH (ref 4.0–10.5)
nRBC: 0.2 % (ref 0.0–0.2)

## 2019-05-12 LAB — MAGNESIUM: Magnesium: 1.9 mg/dL (ref 1.7–2.4)

## 2019-05-12 NOTE — Progress Notes (Signed)
Patient did not show for his appointment today.  I called to see if he could still come today. Patient reports increased pain with any efforts in movement. 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  He reports just not feeling like sitting and waiting on the physician.  Provider aware and will address with patient tomorrow. Provider wants patient's significant other at the appointment tomorrow so they can discuss further treatments.  Patient aware and states that he will be here for new appointment time tomorrow.

## 2019-05-13 ENCOUNTER — Inpatient Hospital Stay (HOSPITAL_BASED_OUTPATIENT_CLINIC_OR_DEPARTMENT_OTHER): Payer: BC Managed Care – PPO | Admitting: Hematology

## 2019-05-13 ENCOUNTER — Encounter (HOSPITAL_COMMUNITY): Payer: Self-pay | Admitting: Hematology

## 2019-05-13 ENCOUNTER — Inpatient Hospital Stay (HOSPITAL_COMMUNITY): Payer: BC Managed Care – PPO

## 2019-05-13 ENCOUNTER — Other Ambulatory Visit: Payer: Self-pay

## 2019-05-13 VITALS — BP 102/65 | HR 92 | Temp 96.7°F | Resp 18

## 2019-05-13 VITALS — BP 115/76 | HR 119 | Temp 97.7°F | Resp 18 | Wt 163.2 lb

## 2019-05-13 DIAGNOSIS — C20 Malignant neoplasm of rectum: Secondary | ICD-10-CM | POA: Diagnosis not present

## 2019-05-13 MED ORDER — SODIUM CHLORIDE 0.9% FLUSH
10.0000 mL | INTRAVENOUS | Status: DC | PRN
  Administered 2019-05-13: 10 mL
  Filled 2019-05-13: qty 10

## 2019-05-13 MED ORDER — HEPARIN SOD (PORK) LOCK FLUSH 100 UNIT/ML IV SOLN: 250.0000 [IU] | Freq: Once | INTRAVENOUS | Status: DC | PRN

## 2019-05-13 MED ORDER — SODIUM CHLORIDE 0.9 % IV SOLN
Freq: Once | INTRAVENOUS | Status: AC
  Administered 2019-05-13: 10:00:00 via INTRAVENOUS

## 2019-05-13 MED ORDER — SODIUM CHLORIDE 0.9 % IV SOLN
180.0000 mg/m2 | Freq: Once | INTRAVENOUS | Status: AC
  Administered 2019-05-13: 340 mg via INTRAVENOUS
  Filled 2019-05-13: qty 2

## 2019-05-13 MED ORDER — ATROPINE SULFATE 1 MG/ML IJ SOLN
0.5000 mg | Freq: Once | INTRAMUSCULAR | Status: AC
  Administered 2019-05-13: 0.5 mg via INTRAVENOUS
  Filled 2019-05-13: qty 1

## 2019-05-13 MED ORDER — SODIUM CHLORIDE 0.9 % IV SOLN
2400.0000 mg/m2 | INTRAVENOUS | Status: DC
  Administered 2019-05-13: 4550 mg via INTRAVENOUS
  Filled 2019-05-13: qty 91

## 2019-05-13 MED ORDER — SODIUM CHLORIDE 0.9 % IV SOLN
5.0000 mg/kg | Freq: Once | INTRAVENOUS | Status: AC
  Administered 2019-05-13: 400 mg via INTRAVENOUS
  Filled 2019-05-13: qty 16

## 2019-05-13 MED ORDER — SODIUM CHLORIDE 0.9 % IV SOLN
423.0000 mg/m2 | Freq: Once | INTRAVENOUS | Status: AC
  Administered 2019-05-13: 800 mg via INTRAVENOUS
  Filled 2019-05-13: qty 35

## 2019-05-13 MED ORDER — FLUOROURACIL CHEMO INJECTION 2.5 GM/50ML
400.0000 mg/m2 | Freq: Once | INTRAVENOUS | Status: AC
  Administered 2019-05-13: 750 mg via INTRAVENOUS
  Filled 2019-05-13: qty 15

## 2019-05-13 MED ORDER — FENTANYL 50 MCG/HR TD PT72: 1.0000 | MEDICATED_PATCH | TRANSDERMAL | 0 refills | Status: DC

## 2019-05-13 MED ORDER — SODIUM CHLORIDE 0.9 % IV SOLN
10.0000 mg | Freq: Once | INTRAVENOUS | Status: AC
  Administered 2019-05-13: 10 mg via INTRAVENOUS
  Filled 2019-05-13: qty 1

## 2019-05-13 MED ORDER — PALONOSETRON HCL INJECTION 0.25 MG/5ML
0.2500 mg | Freq: Once | INTRAVENOUS | Status: AC
  Administered 2019-05-13: 0.25 mg via INTRAVENOUS
  Filled 2019-05-13: qty 5

## 2019-05-13 NOTE — Progress Notes (Signed)
Jonathan Wright, [place] [address]   CLINIC:  Medical Oncology/Hematology  PCP:  Deland Pretty, [place]  [phone]   REASON FOR VISIT:  Follow-up for rectal adenocarcinoma   CURRENT THERAPY:FOLFIRI chemotherapy.   BRIEF ONCOLOGIC HISTORY:  Oncology History Overview Note  Stage IIIB rectal adenocarcinoma, mT2N2M0 Treated with 5Gy x 5 at [facility]   Rectal cancer [facility])  09/20/2015 Procedure   colonoscopy with firm rectal mass and 4 cm polyp in proximal rectum, infiltrating non-obstructing large mass within the distal rectum extending towards anal verge   09/20/2015 Miscellaneous   Microsatellite stable.    09/28/2015 Imaging   CT C/A/P no evidence of distant metastatic disease. irregular thickening of the low rectum and anal canal, several enlarged perirectal LN, indeterminate 24m RUL nodule   09/28/2015 Imaging   MRI pelvis assym diffuse circum lobular thickening of rectum, near full wall thickness extension along lower anterior rectum W/O extra serosal extension c/w known adeno, mult. perirectal LN   12/06/2015 - 12/10/2015 Radiation Therapy   [facility] Dr. AAngelina Ok  2500.00 cGy, 500.00 cGy per day in 5 fractions delivered 1x per day to the 96.0% Isodose line Treatment Dates: 12/06/2015 through 12/10/2015.    01/03/2016 Procedure   PICC placed   01/04/2016 - 03/14/2016 Chemotherapy   FOLFOX x 6 cycles   06/07/2016 Definitive Surgery   Robotic assisted APR with coloanal [facility]  06/19/2016 - 06/23/2016 Hospital Admission   Admit date: 06/19/2016 Admission diagnosis: Sepsis Additional comments: Sepsis due to multiple gram-negative active uremia. Likely source [facility] following, perirectal abscess draining by itself, white count and temperatures improved.  He had a PICC line from prior to this admission which was line removed on 06/21/2016, vancomycin stopped on 06/22/2016, He tolerated test dose of Rocephin well on 06/22/2016 without any issues, [facility], requested to follow with PCP in 3 days to make sure he is tolerating oral medications well and has continued to defervesce, request PCP to please check final culture and sensitivity results which should be back in 3 days. He will follow with general surgery outpatient as well.   02/15/2017 Imaging   CT CAP- 1. Well-formed presacral abscess is decreased in size but persistent. 2. No evidence of colorectal cancer metastasis in the abdomen pelvis. 3. Loop ileostomy in the RIGHT lower quadrant.   04/05/2017 Imaging   CT CAP-IMPRESSION: 1. Status post loop right-sided ileostomy with sigmoid to anal anastomosis. 2. Decrease in presacral fluid collection, consistent with abscess. New foci of extraluminal gas, suspicious for fistulous communication to the lower sigmoid. 3. No evidence of metastatic disease.   04/20/2017 Imaging   MRI pelvis w/ and w/o contrast IMPRESSION: 1. No appreciable change in chronic complex presacral space abscess, which demonstrates four separate sites of fistulous communication to the sigmoid colon and colo-anal anastomosis, as detailed. No discrete mass or other findings to suggest local tumor recurrence. 2. No evidence of metastatic disease in the pelvis.   10/11/2018 Cancer Staging   Staging form: Colon and Rectum, AJCC 7th Edition - Pathologic: Stage IVB (TX, N1, M1b) - Signed by KDerek Jack MD on 10/11/2018   10/22/2018 -  Chemotherapy   The patient had palonosetron (ALOXI) injection 0.25 mg, 0.25 mg, Intravenous,  Once, 6 of 10 cycles Administration: 0.25 mg (10/22/2018), 0.25 mg (11/05/2018), 0.25 mg (11/19/2018), 0.25 mg (04/09/2019), 0.25 mg (04/23/2019), 0.25 mg (05/13/2019) irinotecan  (CAMPTOSAR) 340 mg in dextrose 5 % 500 mL chemo infusion, 180 mg/m2 = 340 mg, Intravenous,  Once, 6 of 10 cycles Administration: 340 mg (10/22/2018), 340 mg (11/05/2018), 340 mg (11/19/2018), 340 mg (04/09/2019), 340 mg (04/23/2019) leucovorin 800 mg in dextrose 5 % 250 mL infusion, 756 mg, Intravenous,  Once, 6 of 10 cycles Administration: 800 mg (10/22/2018), 800 mg (11/05/2018), 800 mg (11/19/2018), 800 mg (04/09/2019), 800 mg (04/23/2019) fluorouracil (ADRUCIL) chemo injection 750 mg, 400 mg/m2 = 750 mg, Intravenous,  Once, 6 of 10 cycles Administration: 750 mg (10/22/2018), 750 mg (11/05/2018), 750 mg (11/19/2018), 750 mg (04/09/2019), 750 mg (04/23/2019) fluorouracil (ADRUCIL) 4,550 mg in sodium chloride 0.9 % 59 mL chemo infusion, 2,400 mg/m2 = 4,550 mg, Intravenous, 1 Day/Dose, 6 of 10 cycles Administration: 4,550 mg (10/22/2018), 4,550 mg (11/05/2018), 4,550 mg (11/19/2018), 4,550 mg (04/09/2019), 4,550 mg (04/23/2019) bevacizumab-bvzr (ZIRABEV) 400 mg in sodium chloride 0.9 % 100 mL chemo infusion, 5 mg/kg = 400 mg (100 % of original dose 5 mg/kg), Intravenous,  Once, 2 of 6 cycles Dose modification: 5 mg/kg (original dose 5 mg/kg, Cycle 5) Administration: 400 mg (04/23/2019)  for chemotherapy treatment.       CANCER STAGING: Cancer Staging Rectal cancer [facility]) Staging form: Colon and Rectum, AJCC 7th Edition - Clinical stage from 01/03/2016: Stage IIIB (T2, N2a, M0) - Signed by Baird Cancer, PA-C on 01/03/2016 - Pathologic: Stage IVB (TX, N1, M1b) - Signed by Derek Jack, MD on 10/11/2018    INTERVAL HISTORY:  Mr. Kuhlman 53 y.o. male seen for follow-up of metastatic rectal cancer.  Last treatment with FOLFIRI and bevacizumab was on 04/23/2019.  He did not experience any bleeding.  Did not experience any nausea, vomiting, diarrhea or constipation.  However he reported that his pain is poorly controlled.  Fentanyl patches are only lasting for 2 days.  He is taking oxycodone 20 mg every 3 hours.  He is  not feeling drowsy.  He reports foul-smelling discharge leaking from the sides of the drainage tubes in the last 2 weeks.  REVIEW OF SYSTEMS:  Review of Systems  Constitutional: Positive for fatigue.  Cardiovascular: Positive for leg swelling.  Musculoskeletal:       Pelvic pain present.  Psychiatric/Behavioral: Positive for sleep disturbance.  All other systems reviewed and are negative.    PAST MEDICAL/SURGICAL HISTORY:  Past Medical History:  Diagnosis Date  . Anxiety   . Depression   . Rectal cancer ([place]) 12/17/2015   Past Surgical History:  Procedure Laterality Date  . ABDOMINAL PERINEAL BOWEL RESECTION  06/07/2016   [facility]  . COLON SURGERY    . DIVERTING ILEOSTOMY  06/07/2016   [facility]  . INGUINAL LYMPH NODE BIOPSY Left 09/18/2018   Procedure: LEFT INGUINAL LYMPH NODE BIOPSY;  Surgeon: Aviva Signs, MD;  Location: [facility];  Service: General;  Laterality: Left;  . IR RADIOLOGIST EVAL & MGMT  12/25/2018  . LAPAROSCOPIC LOW ANTERIOR RESCECTION WITH COLOANAL ANASTOMOSIS  06/07/2016   [facility]  . PERIPHERALLY INSERTED CENTRAL CATHETER INSERTION  11/2015     SOCIAL HISTORY:  Social History   Socioeconomic History  . Marital status: Divorced    Spouse name: Not on file  . Number of children: Not on file  . Years of education: Not on file  . Highest education level: Not on  file  Occupational History  . Not on file  Social Needs  . Financial resource strain: Not on file  . Food insecurity    Worry: Not on file    Inability: Not on file  . Transportation needs    Medical: Not on file    Non-medical: Not on file  Tobacco Use  . Smoking status: Never Smoker  . Smokeless tobacco: Never Used  Substance and Sexual Activity  . Alcohol use: Yes    Comment: occ.   . Drug use: No  . Sexual activity: Not on file  Lifestyle  . Physical activity    Days per week: Not on file    Minutes per session: Not on file  . Stress: Not on file  Relationships  .  Social Herbalist on phone: Not on file    Gets together: Not on file    Attends religious service: Not on file    Active member of club or organization: Not on file    Attends meetings of clubs or organizations: Not on file    Relationship status: Not on file  . Intimate partner violence    Fear of current or ex partner: Not on file    Emotionally abused: Not on file    Physically abused: Not on file    Forced sexual activity: Not on file  Other Topics Concern  . Not on file  Social History Narrative  . Not on file    FAMILY HISTORY:  Family History  Problem Relation Age of Onset  . Rectal cancer Neg Hx     CURRENT MEDICATIONS:  Outpatient Encounter Medications as of 05/13/2019  Medication Sig  . ALPRAZolam (XANAX) 1 MG tablet Take 0.5 tablets (0.5 mg total) by mouth 2 (two) times daily as needed for anxiety.  . fentaNYL (DURAGESIC) 100 MCG/HR Place 2 patches onto the skin every 3 (three) days.  Marland Kitchen FLUoxetine (PROZAC) 40 MG capsule TAKE 1 CAPSULE(40 MG) BY MOUTH DAILY  . Oxycodone HCl 20 MG TABS Take 1 tablet (20 mg total) by mouth every 3 (three) hours as needed (severe pain).  Marland Kitchen sulfamethoxazole-trimethoprim (BACTRIM DS) 800-160 MG tablet Take 1 tablet by mouth 2 (two) times daily.  . [DISCONTINUED] ciprofloxacin (CIPRO) 500 MG tablet Take 1 tablet (500 mg total) by mouth 2 (two) times daily.  . fentaNYL (DURAGESIC) 50 MCG/HR Place 1 patch onto the skin every 3 (three) days. Use along with fentanyl 100 mcg patches.  Total dose 250 mcg  . ondansetron (ZOFRAN ODT) 4 MG disintegrating tablet Take 1 tablet (4 mg total) by mouth every 8 (eight) hours as needed for nausea or vomiting. (Patient not taking: Reported on 05/13/2019)  . prochlorperazine (COMPAZINE) 10 MG tablet Take 1 tablet (10 mg total) by mouth every 6 (six) hours as needed for nausea or vomiting. (Patient not taking: Reported on 04/23/2019)   No facility-administered encounter medications on file as of  05/13/2019.     ALLERGIES:  No Known Allergies   PHYSICAL EXAM:  ECOG Performance status: 1  Blood pressure is 115/76.  Pulse rate is 119.  Respiratory is 18.  Temperature is 97.7.  Saturations are 100%.  Physical Exam Constitutional:      Appearance: Normal appearance.  Cardiovascular:     Rate and Rhythm: Normal rate and regular rhythm.     Heart sounds: Normal heart sounds.  Pulmonary:     Effort: Pulmonary effort is normal.     Breath sounds: Normal  breath sounds.  Abdominal:     Palpations: Abdomen is soft.  Musculoskeletal:        General: No swelling.  Skin:    General: Skin is warm.  Neurological:     General: No focal deficit present.     Mental Status: He is alert and oriented to person, place, and time.  Psychiatric:        Mood and Affect: Mood normal.        Behavior: Behavior normal.    Drain has serosanguineous fluid.  LABORATORY DATA:  I have reviewed the labs as listed.  CBC    Component Value Date/Time   WBC 24.3 (H) 05/12/2019 1322   RBC 3.72 (L) 05/12/2019 1322   HGB 9.0 (L) 05/12/2019 1322   HCT 31.3 (L) 05/12/2019 1322   PLT 668 (H) 05/12/2019 1322   MCV 84.1 05/12/2019 1322   MCH 24.2 (L) 05/12/2019 1322   MCHC 28.8 (L) 05/12/2019 1322   RDW 24.2 (H) 05/12/2019 1322   LYMPHSABS 2.7 05/12/2019 1322   MONOABS 2.5 (H) 05/12/2019 1322   EOSABS 0.5 05/12/2019 1322   BASOSABS 0.1 05/12/2019 1322   CMP Latest Ref Rng & Units 05/12/2019 04/22/2019 04/08/2019  Glucose 70 - 99 mg/dL 159(H) 249(H) 168(H)  BUN 6 - 20 mg/dL 18 17 18   Creatinine 0.61 - 1.24 mg/dL 1.24 1.42(H) 1.42(H)  Sodium 135 - 145 mmol/L 133(L) 131(L) 135  Potassium 3.5 - 5.1 mmol/L 4.2 3.5 3.9  Chloride 98 - 111 mmol/L 98 98 101  CO2 22 - 32 mmol/L 20(L) 18(L) 24  Calcium 8.9 - 10.3 mg/dL 9.0 8.5(L) 8.9  Total Protein 6.5 - 8.1 g/dL 7.3 7.4 7.9  Total Bilirubin 0.3 - 1.2 mg/dL 0.3 0.2(L) 0.3  Alkaline Phos 38 - 126 U/L 234(H) 192(H) 137(H)  AST 15 - 41 U/L 28 23 21   ALT 0  - 44 U/L 38 30 19       DIAGNOSTIC IMAGING:  I have independently reviewed the scans and discussed with the patient.   I have reviewed Venita Lick LPN's note and agree with the documentation.  I personally performed a face-to-face visit, made revisions and my assessment and plan is as follows.    ASSESSMENT & PLAN:   Rectal cancer ([place]) 1.  Metastatic rectal adenocarcinoma, K-ras G 13 D, MS-stable: - Pelvic exenteration at [facility] clinic on 06/06/2018. -3 cycles of palliative chemotherapy with FOLFIRI from 10/22/2018 through 11/19/2018. -CT-guided drain placement by IR on 12/09/2018.  Left parasacral drain placed at [facility] in July of this year.  This is still draining about 100 mL/day of bloody fluid. - CTAP on 03/31/2019 shows left parasacral drain with left pelvic floor lesion/collection.  This has clearly progressed in the interval measuring 7.6 x 5.6 cm.  Right parasacral catheter tip is coiled in the presacral fluid collection which shows evidence of superior and anterior progression. - Because of progression, I have recommended restarting chemotherapy. - He received cycle 4 on 04/09/2019 and cycle 5 on 04/23/2019.  He had received first dose of bevacizumab with cycle 5.  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  -He denied any chemotherapy related side effects.  However his pain is poorly controlled. -He also noticed secretions coming from the sides of his drain tubes.  Secretions are foul-smelling. - Today he felt more comfortable lying down in the left lateral position for better pain control. - We talked about various options including stopping therapy.  Patient would like to continue  treatment as it is not affecting him adversely. -He will proceed with his next cycle today.  I plan to repeat scans after 4-6 treatments. -He will call IR for about the leakage of the drain tubes.  2.  Pelvic pain: - He is using fentanyl 200 mcg patch. -He is taking oxycodone 20 mg every 3  hours.  He is not drowsy on the current regimen. - He reports that pain patches were only working for 2 days.  I will increase fentanyl to 250 mcg.  3.  Recurrent infections: - Urine cultures grew Proteus Penneri and enterococcus faecalis. -He is taking Cipro.  However he does not report any improvement in the urine.  I have sent a prescription for Bactrim.  4.  Microcytic anemia: -Feraheme on 04/10/2028 2020. - Hemoglobin is 9 today.  Total time spent is 40 minutes with more than 50% of the time spent face-to-face discussing treatment plan, counseling and coordination of care.    Orders placed this encounter:  Orders Placed This Encounter  Procedures  . CBC with Differential/Platelet  . Comprehensive metabolic panel  . CEA      Derek Jack, MD [place] [phone]

## 2019-05-13 NOTE — Patient Instructions (Signed)
[facility] at [facility] Discharge Instructions  You were seen today by Dr. Katragadda. He went over your recent lab results. He will see you back in 2 weeks for labs and follow up.   Thank you for choosing [facility] at [facility] to provide your oncology and hematology care.  To afford each patient quality time with our provider, please arrive at least 15 minutes before your scheduled appointment time.   If you have a lab appointment with the Cancer Center please come in thru the  Main Entrance and check in at the main information desk  You need to re-schedule your appointment should you arrive 10 or more minutes late.  We strive to give you quality time with our providers, and arriving late affects you and other patients whose appointments are after yours.  Also, if you no show three or more times for appointments you may be dismissed from the clinic at the providers discretion.     Again, thank you for choosing [facility].  Our hope is that these requests will decrease the amount of time that you wait before being seen by our physicians.       _____________________________________________________________  Should you have questions after your visit to [facility], please contact our office at [phone] between the hours of 8:00 a.m. and 4:30 p.m.  Voicemails left after 4:00 p.m. will not be returned until the following business day.  For prescription refill requests, have your pharmacy contact our office and allow 72 hours.    Cancer Center Support Programs:   > Cancer Support Group  2nd Tuesday of the month 1pm-2pm, Journey Room    

## 2019-05-13 NOTE — Assessment & Plan Note (Addendum)
1.  Metastatic rectal adenocarcinoma, K-ras G 13 D, MS-stable: - Pelvic exenteration at [facility] clinic on 06/06/2018. -3 cycles of palliative chemotherapy with FOLFIRI from 10/22/2018 through 11/19/2018. -CT-guided drain placement by IR on 12/09/2018.  Left parasacral drain placed at [facility] in July of this year.  This is still draining about 100 mL/day of bloody fluid. - CTAP on 03/31/2019 shows left parasacral drain with left pelvic floor lesion/collection.  This has clearly progressed in the interval measuring 7.6 x 5.6 cm.  Right parasacral catheter tip is coiled in the presacral fluid collection which shows evidence of superior and anterior progression. - Because of progression, I have recommended restarting chemotherapy. - He received cycle 4 on 04/09/2019 and cycle 5 on 04/23/2019.  He had received first dose of bevacizumab with cycle 5.  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  -He denied any chemotherapy related side effects.  However his pain is poorly controlled. -He also noticed secretions coming from the sides of his drain tubes.  Secretions are foul-smelling. - Today he felt more comfortable lying down in the left lateral position for better pain control. - We talked about various options including stopping therapy.  Patient would like to continue treatment as it is not affecting him adversely. -He will proceed with his next cycle today.  I plan to repeat scans after 4-6 treatments. -He will call IR for about the leakage of the drain tubes.  2.  Pelvic pain: - He is using fentanyl 200 mcg patch. -He is taking oxycodone 20 mg every 3 hours.  He is not drowsy on the current regimen. - He reports that pain patches were only working for 2 days.  I will increase fentanyl to 250 mcg.  3.  Recurrent infections: - Urine cultures grew Proteus Penneri and enterococcus faecalis. -He is taking Cipro.  However he does not report any improvement in the urine.  I have sent a prescription for  Bactrim.  4.  Microcytic anemia: -Feraheme on 04/10/2028 2020. - Hemoglobin is 9 today.

## 2019-05-13 NOTE — Progress Notes (Signed)
05/13/19  Ok to proceed with bevacizumab today with pelvic drains in.  T.O. Dr Beckey Downing LPN/Kage Willmann Ronnald Ramp, PharmD

## 2019-05-13 NOTE — Patient Instructions (Signed)
[facility] Discharge Instructions for Patients Receiving Chemotherapy   Beginning January 23rd 2017 lab work for the Cancer Center will be done in the  Main lab at [facility] on 1st floor. If you have a lab appointment with the Cancer Center please come in thru the  Main Entrance and check in at the main information desk   Today you received the following chemotherapy agents Avastin, Irinotecan, Leucovorin, and 5FU  To help prevent nausea and vomiting after your treatment, we encourage you to take your nausea medication   If you develop nausea and vomiting, or diarrhea that is not controlled by your medication, call the clinic.  The clinic phone number is [phone]. Office hours are Monday-Friday 8:30am-5:00pm.  BELOW ARE SYMPTOMS THAT SHOULD BE REPORTED IMMEDIATELY:  *FEVER GREATER THAN 101.0 F  *CHILLS WITH OR WITHOUT FEVER  NAUSEA AND VOMITING THAT IS NOT CONTROLLED WITH YOUR NAUSEA MEDICATION  *UNUSUAL SHORTNESS OF BREATH  *UNUSUAL BRUISING OR BLEEDING  TENDERNESS IN MOUTH AND THROAT WITH OR WITHOUT PRESENCE OF ULCERS  *URINARY PROBLEMS  *BOWEL PROBLEMS  UNUSUAL RASH Items with * indicate a potential emergency and should be followed up as soon as possible. If you have an emergency after office hours please contact your primary care physician or go to the nearest emergency department.  Please call the clinic during office hours if you have any questions or concerns.   You may also contact the Patient Navigator at [phone] should you have any questions or need assistance in obtaining follow up care.      Resources For Cancer Patients and their Caregivers ? American Cancer Society: Can assist with transportation, wigs, general needs, runs Look Good Feel Better.        1-888-227-6333 ? Cancer Care: Provides financial assistance, online support groups, medication/co-pay assistance.  1-800-813-HOPE (4673) ? [facility] Assists [place] cancer patients and their families through emotional , educational and financial support.  336-427-4357 ? [place] DSS Where to apply for food stamps, Medicaid and utility assistance. 336-342-1394 ? [facility]: Transportation to medical appointments. 336-347-2287 ? Social Security Administration: May apply for disability if have a Stage IV cancer. 336-342-7796 1-800-772-1213 ? [place] Aging, Disability and Transit Services: Assists with nutrition, care and transit needs. 336-349-2343          

## 2019-05-13 NOTE — Progress Notes (Signed)
0930- pt seen by Dr. Delton Coombes and lab work reviewed, including WBC of 24. HR of 119 also noted. Per MD, ok to proceed with treatment today.  Jonathan Wright tolerated treatment without incident or complaint. VSS. 5FU infusing via home infusion pump through PICC line without difficulties. Pt discharged in satisfactory condition with follow up instructions.

## 2019-05-15 ENCOUNTER — Other Ambulatory Visit: Payer: Self-pay

## 2019-05-15 ENCOUNTER — Inpatient Hospital Stay (HOSPITAL_COMMUNITY): Payer: BC Managed Care – PPO

## 2019-05-15 NOTE — Progress Notes (Signed)
Pt's significant other presents to clinic after having discontinued pt's home infusion pump upon completion.  Jonathan Wright had called the clinic earlier and reported that he was too tired and did not feel well enough to get up and come to the clinic in person for pump d/c.  Pump, fanny pack, and empty infusion bag and tubing were all secure in hazardous waste bag.  Discarded items appropriately and pump cleaned and returned to storage bin.

## 2019-05-20 ENCOUNTER — Other Ambulatory Visit (HOSPITAL_COMMUNITY): Payer: Self-pay | Admitting: *Deleted

## 2019-05-20 DIAGNOSIS — C20 Malignant neoplasm of rectum: Secondary | ICD-10-CM

## 2019-05-21 MED ORDER — SULFAMETHOXAZOLE-TRIMETHOPRIM 800-160 MG PO TABS: 1.0000 | ORAL_TABLET | Freq: Two times a day (BID) | ORAL | 0 refills | Status: DC

## 2019-05-21 MED ORDER — FENTANYL 100 MCG/HR TD PT72: 2.0000 | MEDICATED_PATCH | TRANSDERMAL | 0 refills | Status: DC

## 2019-05-21 MED ORDER — OXYCODONE HCL 20 MG PO TABS: 1.0000 | ORAL_TABLET | ORAL | 0 refills | Status: DC | PRN

## 2019-05-21 MED ORDER — PROCHLORPERAZINE MALEATE 10 MG PO TABS: 10.0000 mg | ORAL_TABLET | Freq: Four times a day (QID) | ORAL | 6 refills | Status: DC | PRN

## 2019-05-26 ENCOUNTER — Other Ambulatory Visit (HOSPITAL_COMMUNITY): Payer: BC Managed Care – PPO

## 2019-05-27 ENCOUNTER — Ambulatory Visit (HOSPITAL_COMMUNITY): Payer: BC Managed Care – PPO

## 2019-05-27 ENCOUNTER — Inpatient Hospital Stay (HOSPITAL_COMMUNITY): Payer: BC Managed Care – PPO

## 2019-05-27 ENCOUNTER — Ambulatory Visit (HOSPITAL_COMMUNITY): Payer: BC Managed Care – PPO | Admitting: Hematology

## 2019-05-28 ENCOUNTER — Other Ambulatory Visit (HOSPITAL_COMMUNITY): Payer: BC Managed Care – PPO

## 2019-05-28 ENCOUNTER — Ambulatory Visit (HOSPITAL_COMMUNITY): Payer: BC Managed Care – PPO

## 2019-05-28 ENCOUNTER — Ambulatory Visit (HOSPITAL_COMMUNITY): Payer: BC Managed Care – PPO | Admitting: Hematology

## 2019-05-29 ENCOUNTER — Other Ambulatory Visit (HOSPITAL_COMMUNITY): Payer: Self-pay | Admitting: *Deleted

## 2019-05-29 ENCOUNTER — Encounter (HOSPITAL_COMMUNITY): Payer: BC Managed Care – PPO

## 2019-05-29 MED ORDER — FENTANYL 100 MCG/HR TD PT72: 3.0000 | MEDICATED_PATCH | TRANSDERMAL | 0 refills | Status: DC

## 2019-05-30 ENCOUNTER — Inpatient Hospital Stay (HOSPITAL_COMMUNITY): Payer: BC Managed Care – PPO

## 2019-05-30 ENCOUNTER — Encounter (HOSPITAL_COMMUNITY): Payer: BC Managed Care – PPO

## 2019-06-02 ENCOUNTER — Ambulatory Visit (HOSPITAL_COMMUNITY): Payer: BC Managed Care – PPO | Admitting: Hematology

## 2019-06-02 ENCOUNTER — Ambulatory Visit (HOSPITAL_COMMUNITY): Payer: BC Managed Care – PPO

## 2019-06-03 ENCOUNTER — Inpatient Hospital Stay (HOSPITAL_COMMUNITY): Payer: BC Managed Care – PPO

## 2019-06-03 ENCOUNTER — Other Ambulatory Visit (HOSPITAL_COMMUNITY): Payer: Self-pay | Admitting: Nurse Practitioner

## 2019-06-03 ENCOUNTER — Telehealth (HOSPITAL_COMMUNITY): Payer: Self-pay | Admitting: Hematology

## 2019-06-03 NOTE — Telephone Encounter (Signed)
Obtained another PA for pts Fentanyl 181mcg patches. PC to [facility] spk to [place] the pharmacist...asked her to run script again. Unfortunately, the script is still rejecting. Per Larene Beach she will call [facility] to see if she can resolve the issue.

## 2019-06-04 ENCOUNTER — Encounter (HOSPITAL_COMMUNITY): Payer: BC Managed Care – PPO

## 2019-06-04 ENCOUNTER — Other Ambulatory Visit (HOSPITAL_COMMUNITY): Payer: Self-pay | Admitting: *Deleted

## 2019-06-04 ENCOUNTER — Ambulatory Visit (HOSPITAL_COMMUNITY): Payer: BC Managed Care – PPO

## 2019-06-04 ENCOUNTER — Ambulatory Visit (HOSPITAL_COMMUNITY): Payer: BC Managed Care – PPO | Admitting: Hematology

## 2019-06-04 DIAGNOSIS — K6289 Other specified diseases of anus and rectum: Secondary | ICD-10-CM

## 2019-06-04 MED ORDER — OXYCODONE HCL 20 MG PO TABS: 1.0000 | ORAL_TABLET | ORAL | 0 refills | Status: DC | PRN

## 2019-06-04 MED ORDER — ALPRAZOLAM 1 MG PO TABS: 0.5000 mg | ORAL_TABLET | Freq: Two times a day (BID) | ORAL | 0 refills | Status: DC | PRN

## 2019-06-06 ENCOUNTER — Encounter (HOSPITAL_COMMUNITY): Payer: BC Managed Care – PPO

## 2019-06-07 ENCOUNTER — Emergency Department (HOSPITAL_COMMUNITY): Payer: BC Managed Care – PPO

## 2019-06-07 ENCOUNTER — Inpatient Hospital Stay (HOSPITAL_COMMUNITY)
Admission: EM | Admit: 2019-06-07 | Discharge: 2019-06-17 | DRG: 853 | Disposition: A | Discharge status: Home Health | Payer: BC Managed Care – PPO | Attending: Internal Medicine | Admitting: Internal Medicine

## 2019-06-07 DIAGNOSIS — Z79899 Other long term (current) drug therapy: Secondary | ICD-10-CM

## 2019-06-07 DIAGNOSIS — R6521 Severe sepsis with septic shock: Secondary | ICD-10-CM | POA: Diagnosis present

## 2019-06-07 DIAGNOSIS — I2699 Other pulmonary embolism without acute cor pulmonale: Secondary | ICD-10-CM

## 2019-06-07 DIAGNOSIS — Z9221 Personal history of antineoplastic chemotherapy: Secondary | ICD-10-CM | POA: Diagnosis not present

## 2019-06-07 DIAGNOSIS — B9620 Unspecified Escherichia coli [E. coli] as the cause of diseases classified elsewhere: Secondary | ICD-10-CM | POA: Diagnosis present

## 2019-06-07 DIAGNOSIS — I82452 Acute embolism and thrombosis of left peroneal vein: Secondary | ICD-10-CM | POA: Diagnosis present

## 2019-06-07 DIAGNOSIS — I729 Aneurysm of unspecified site: Secondary | ICD-10-CM | POA: Diagnosis not present

## 2019-06-07 DIAGNOSIS — Z933 Colostomy status: Secondary | ICD-10-CM

## 2019-06-07 DIAGNOSIS — I82412 Acute embolism and thrombosis of left femoral vein: Secondary | ICD-10-CM | POA: Diagnosis present

## 2019-06-07 DIAGNOSIS — R55 Syncope and collapse: Secondary | ICD-10-CM

## 2019-06-07 DIAGNOSIS — Z87440 Personal history of urinary (tract) infections: Secondary | ICD-10-CM

## 2019-06-07 DIAGNOSIS — A0472 Enterocolitis due to Clostridium difficile, not specified as recurrent: Secondary | ICD-10-CM | POA: Diagnosis not present

## 2019-06-07 DIAGNOSIS — G893 Neoplasm related pain (acute) (chronic): Secondary | ICD-10-CM | POA: Diagnosis not present

## 2019-06-07 DIAGNOSIS — K651 Peritoneal abscess: Secondary | ICD-10-CM | POA: Diagnosis present

## 2019-06-07 DIAGNOSIS — E871 Hypo-osmolality and hyponatremia: Secondary | ICD-10-CM | POA: Diagnosis not present

## 2019-06-07 DIAGNOSIS — N179 Acute kidney failure, unspecified: Secondary | ICD-10-CM | POA: Diagnosis present

## 2019-06-07 DIAGNOSIS — E872 Acidosis: Secondary | ICD-10-CM | POA: Diagnosis present

## 2019-06-07 DIAGNOSIS — J9601 Acute respiratory failure with hypoxia: Secondary | ICD-10-CM | POA: Diagnosis present

## 2019-06-07 DIAGNOSIS — Z515 Encounter for palliative care: Secondary | ICD-10-CM | POA: Diagnosis not present

## 2019-06-07 DIAGNOSIS — Z923 Personal history of irradiation: Secondary | ICD-10-CM | POA: Diagnosis not present

## 2019-06-07 DIAGNOSIS — R188 Other ascites: Secondary | ICD-10-CM

## 2019-06-07 DIAGNOSIS — A419 Sepsis, unspecified organism: Principal | ICD-10-CM | POA: Diagnosis present

## 2019-06-07 DIAGNOSIS — R092 Respiratory arrest: Secondary | ICD-10-CM | POA: Diagnosis present

## 2019-06-07 DIAGNOSIS — D62 Acute posthemorrhagic anemia: Secondary | ICD-10-CM | POA: Diagnosis present

## 2019-06-07 DIAGNOSIS — I82432 Acute embolism and thrombosis of left popliteal vein: Secondary | ICD-10-CM | POA: Diagnosis present

## 2019-06-07 DIAGNOSIS — I723 Aneurysm of iliac artery: Secondary | ICD-10-CM | POA: Diagnosis present

## 2019-06-07 DIAGNOSIS — C20 Malignant neoplasm of rectum: Secondary | ICD-10-CM | POA: Diagnosis not present

## 2019-06-07 DIAGNOSIS — R0603 Acute respiratory distress: Secondary | ICD-10-CM | POA: Diagnosis not present

## 2019-06-07 DIAGNOSIS — Z85048 Personal history of other malignant neoplasm of rectum, rectosigmoid junction, and anus: Secondary | ICD-10-CM | POA: Diagnosis not present

## 2019-06-07 DIAGNOSIS — Z20828 Contact with and (suspected) exposure to other viral communicable diseases: Secondary | ICD-10-CM | POA: Diagnosis present

## 2019-06-07 DIAGNOSIS — E876 Hypokalemia: Secondary | ICD-10-CM | POA: Diagnosis present

## 2019-06-07 DIAGNOSIS — Z7401 Bed confinement status: Secondary | ICD-10-CM

## 2019-06-07 DIAGNOSIS — D649 Anemia, unspecified: Secondary | ICD-10-CM

## 2019-06-07 DIAGNOSIS — Z86711 Personal history of pulmonary embolism: Secondary | ICD-10-CM

## 2019-06-07 DIAGNOSIS — F419 Anxiety disorder, unspecified: Secondary | ICD-10-CM | POA: Diagnosis present

## 2019-06-07 DIAGNOSIS — R578 Other shock: Secondary | ICD-10-CM | POA: Diagnosis present

## 2019-06-07 DIAGNOSIS — Z932 Ileostomy status: Secondary | ICD-10-CM

## 2019-06-07 LAB — GLUCOSE, CAPILLARY: Glucose-Capillary: 180 mg/dL — ABNORMAL HIGH (ref 70–99)

## 2019-06-07 LAB — CBC WITH DIFFERENTIAL/PLATELET
Abs Immature Granulocytes: 14.15 10*3/uL — ABNORMAL HIGH (ref 0.00–0.07)
Abs Immature Granulocytes: 1.1 10*3/uL — ABNORMAL HIGH (ref 0.00–0.07)
Basophils Absolute: 0.1 10*3/uL (ref 0.0–0.1)
Basophils Absolute: 0 10*3/uL (ref 0.0–0.1)
Basophils Relative: 0 %
Basophils Relative: 0 %
Eosinophils Absolute: 0.2 10*3/uL (ref 0.0–0.5)
Eosinophils Absolute: 0 10*3/uL (ref 0.0–0.5)
Eosinophils Relative: 0 %
Eosinophils Relative: 0 %
HCT: 9.9 % — ABNORMAL LOW (ref 39.0–52.0)
HCT: 20.8 % — ABNORMAL LOW (ref 39.0–52.0)
Hemoglobin: 2.7 g/dL — CL (ref 13.0–17.0)
Hemoglobin: 7.3 g/dL — ABNORMAL LOW (ref 13.0–17.0)
Immature Granulocytes: 17 %
Lymphocytes Relative: 14 %
Lymphocytes Relative: 1 %
Lymphs Abs: 11.6 10*3/uL — ABNORMAL HIGH (ref 0.7–4.0)
Lymphs Abs: 0.5 10*3/uL — ABNORMAL LOW (ref 0.7–4.0)
MCH: 31.4 pg (ref 26.0–34.0)
MCH: 32.6 pg (ref 26.0–34.0)
MCHC: 27.3 g/dL — ABNORMAL LOW (ref 30.0–36.0)
MCHC: 35.1 g/dL (ref 30.0–36.0)
MCV: 115.1 fL — ABNORMAL HIGH (ref 80.0–100.0)
MCV: 92.9 fL (ref 80.0–100.0)
Metamyelocytes Relative: 1 %
Monocytes Absolute: 4.9 10*3/uL — ABNORMAL HIGH (ref 0.1–1.0)
Monocytes Absolute: 0.5 10*3/uL (ref 0.1–1.0)
Monocytes Relative: 6 %
Monocytes Relative: 1 %
Myelocytes: 1 %
Neutro Abs: 53.8 10*3/uL — ABNORMAL HIGH (ref 1.7–7.7)
Neutro Abs: 51.6 10*3/uL — ABNORMAL HIGH (ref 1.7–7.7)
Neutrophils Relative %: 63 %
Neutrophils Relative %: 96 %
Platelets: 729 10*3/uL — ABNORMAL HIGH (ref 150–400)
Platelets: 388 10*3/uL (ref 150–400)
RBC: 0.86 MIL/uL — ABNORMAL LOW (ref 4.22–5.81)
RBC: 2.24 MIL/uL — ABNORMAL LOW (ref 4.22–5.81)
RDW: 16.2 % — ABNORMAL HIGH (ref 11.5–15.5)
WBC: 84.8 10*3/uL (ref 4.0–10.5)
WBC: 53.8 10*3/uL (ref 4.0–10.5)
nRBC: 2.2 % — ABNORMAL HIGH (ref 0.0–0.2)
nRBC: 2 % — ABNORMAL HIGH (ref 0.0–0.2)
nRBC: 3 /100 WBC — ABNORMAL HIGH

## 2019-06-07 LAB — COMPREHENSIVE METABOLIC PANEL
ALT: <5 U/L (ref 0–44)
ALT: 14 U/L (ref 0–44)
AST: 31 U/L (ref 15–41)
AST: 32 U/L (ref 15–41)
Albumin: 1.4 g/dL — ABNORMAL LOW (ref 3.5–5.0)
Albumin: 1.3 g/dL — ABNORMAL LOW (ref 3.5–5.0)
Alkaline Phosphatase: 139 U/L — ABNORMAL HIGH (ref 38–126)
Alkaline Phosphatase: 130 U/L — ABNORMAL HIGH (ref 38–126)
Anion gap: 16 — ABNORMAL HIGH (ref 5–15)
BUN: 36 mg/dL — ABNORMAL HIGH (ref 6–20)
BUN: 29 mg/dL — ABNORMAL HIGH (ref 6–20)
CO2: <7 mmol/L — ABNORMAL LOW (ref 22–32)
CO2: 20 mmol/L — ABNORMAL LOW (ref 22–32)
Calcium: 8.2 mg/dL — ABNORMAL LOW (ref 8.9–10.3)
Calcium: 6.9 mg/dL — ABNORMAL LOW (ref 8.9–10.3)
Chloride: 98 mmol/L (ref 98–111)
Chloride: 97 mmol/L — ABNORMAL LOW (ref 98–111)
Creatinine, Ser: 2.27 mg/dL — ABNORMAL HIGH (ref 0.61–1.24)
Creatinine, Ser: 1.74 mg/dL — ABNORMAL HIGH (ref 0.61–1.24)
GFR calc Af Amer: 37 mL/min — ABNORMAL LOW (ref >60)
GFR calc Af Amer: 51 mL/min — ABNORMAL LOW (ref >60)
GFR calc non Af Amer: 32 mL/min — ABNORMAL LOW (ref >60)
GFR calc non Af Amer: 44 mL/min — ABNORMAL LOW (ref >60)
Glucose, Bld: 315 mg/dL — ABNORMAL HIGH (ref 70–99)
Glucose, Bld: 285 mg/dL — ABNORMAL HIGH (ref 70–99)
Potassium: 5.7 mmol/L — ABNORMAL HIGH (ref 3.5–5.1)
Potassium: 4.1 mmol/L (ref 3.5–5.1)
Sodium: 128 mmol/L — ABNORMAL LOW (ref 135–145)
Sodium: 133 mmol/L — ABNORMAL LOW (ref 135–145)
Total Bilirubin: 0.1 mg/dL — ABNORMAL LOW (ref 0.3–1.2)
Total Bilirubin: 1.6 mg/dL — ABNORMAL HIGH (ref 0.3–1.2)
Total Protein: 4.5 g/dL — ABNORMAL LOW (ref 6.5–8.1)
Total Protein: 4.1 g/dL — ABNORMAL LOW (ref 6.5–8.1)

## 2019-06-07 LAB — LACTIC ACID, PLASMA
Lactic Acid, Venous: 5.3 mmol/L (ref 0.5–1.9)
Lactic Acid, Venous: 5 mmol/L (ref 0.5–1.9)

## 2019-06-07 LAB — PROTIME-INR
INR: 1.4 — ABNORMAL HIGH (ref 0.8–1.2)
Prothrombin Time: 16.8 seconds — ABNORMAL HIGH (ref 11.4–15.2)

## 2019-06-07 LAB — CK: Total CK: 291 U/L (ref 49–397)

## 2019-06-07 LAB — IRON AND TIBC
Iron: 64 ug/dL (ref 45–182)
Saturation Ratios: 34 % (ref 17.9–39.5)
TIBC: 190 ug/dL — ABNORMAL LOW (ref 250–450)
UIBC: 126 ug/dL

## 2019-06-07 LAB — SARS CORONAVIRUS 2 BY RT PCR (HOSPITAL ORDER, PERFORMED IN ~~LOC~~ HOSPITAL LAB): SARS Coronavirus 2: NEGATIVE

## 2019-06-07 LAB — BLOOD GAS, ARTERIAL
Acid-base deficit: 25.8 mmol/L — ABNORMAL HIGH (ref 0.0–2.0)
FIO2: 100
O2 Saturation: 66.4 %
Patient temperature: 37.2
pCO2 arterial: 24.2 mmHg — ABNORMAL LOW (ref 32.0–48.0)
pH, Arterial: 6.888 — CL (ref 7.350–7.450)
pO2, Arterial: 63.1 mmHg — ABNORMAL LOW (ref 83.0–108.0)

## 2019-06-07 LAB — MAGNESIUM: Magnesium: 1.9 mg/dL (ref 1.7–2.4)

## 2019-06-07 LAB — PHOSPHORUS: Phosphorus: 4.7 mg/dL — ABNORMAL HIGH (ref 2.5–4.6)

## 2019-06-07 LAB — PREPARE RBC (CROSSMATCH)

## 2019-06-07 LAB — RETICULOCYTES: RBC.: 0.86 MIL/uL — ABNORMAL LOW (ref 4.22–5.81)

## 2019-06-07 LAB — FERRITIN: Ferritin: 221 ng/mL (ref 24–336)

## 2019-06-07 LAB — FOLATE: Folate: 21.3 ng/mL (ref >5.9)

## 2019-06-07 LAB — ABO/RH: ABO/RH(D): A POS

## 2019-06-07 LAB — TROPONIN I (HIGH SENSITIVITY)
Troponin I (High Sensitivity): 9 ng/L (ref <18)
Troponin I (High Sensitivity): 15 ng/L (ref <18)

## 2019-06-07 LAB — VITAMIN B12: Vitamin B-12: 1202 pg/mL — ABNORMAL HIGH (ref 180–914)

## 2019-06-07 LAB — HIV ANTIBODY (ROUTINE TESTING W REFLEX): HIV Screen 4th Generation wRfx: NONREACTIVE

## 2019-06-07 LAB — CBG MONITORING, ED: Glucose-Capillary: 273 mg/dL — ABNORMAL HIGH (ref 70–99)

## 2019-06-07 LAB — MRSA PCR SCREENING: MRSA by PCR: NEGATIVE

## 2019-06-07 MED ORDER — SODIUM BICARBONATE 8.4 % IV SOLN
INTRAVENOUS | Status: AC
  Filled 2019-06-07: qty 50

## 2019-06-07 MED ORDER — SODIUM CHLORIDE 0.9 % IV SOLN
INTRAVENOUS | Status: DC
  Administered 2019-06-07: 21:00:00 via INTRAVENOUS

## 2019-06-07 MED ORDER — PANTOPRAZOLE SODIUM 40 MG IV SOLR: 40.0000 mg | Freq: Two times a day (BID) | INTRAVENOUS | Status: DC

## 2019-06-07 MED ORDER — NOREPINEPHRINE 4 MG/250ML-% IV SOLN
0.0000 ug/min | INTRAVENOUS | Status: DC
  Administered 2019-06-07: 14:00:00 5 ug/min via INTRAVENOUS
  Filled 2019-06-07 (×2): qty 250

## 2019-06-07 MED ORDER — ETOMIDATE 2 MG/ML IV SOLN
INTRAVENOUS | Status: AC | PRN
  Administered 2019-06-07: 20 mg via INTRAVENOUS

## 2019-06-07 MED ORDER — ROCURONIUM BROMIDE 50 MG/5ML IV SOLN
INTRAVENOUS | Status: AC | PRN
  Administered 2019-06-07: 100 mg via INTRAVENOUS

## 2019-06-07 MED ORDER — SODIUM BICARBONATE 8.4 % IV SOLN
INTRAVENOUS | Status: AC
  Administered 2019-06-07: 150 meq via INTRAVENOUS
  Filled 2019-06-07: qty 50

## 2019-06-07 MED ORDER — SODIUM CHLORIDE 0.9% IV SOLUTION
Freq: Once | INTRAVENOUS | Status: AC
  Administered 2019-06-07: 23:00:00 via INTRAVENOUS

## 2019-06-07 MED ORDER — CHLORHEXIDINE GLUCONATE CLOTH 2 % EX PADS
6.0000 | MEDICATED_PAD | Freq: Every day | CUTANEOUS | Status: DC
  Administered 2019-06-07 – 2019-06-17 (×12): 6 via TOPICAL

## 2019-06-07 MED ORDER — DEXMEDETOMIDINE HCL IN NACL 400 MCG/100ML IV SOLN
0.0000 ug/kg/h | INTRAVENOUS | Status: DC
  Administered 2019-06-07: 0.4 ug/kg/h via INTRAVENOUS
  Administered 2019-06-08 (×3): 1 ug/kg/h via INTRAVENOUS
  Filled 2019-06-07 (×4): qty 100

## 2019-06-07 MED ORDER — SODIUM CHLORIDE 0.9 % IV SOLN
8.0000 mg/h | INTRAVENOUS | Status: AC
  Administered 2019-06-07 – 2019-06-10 (×7): 8 mg/h via INTRAVENOUS
  Filled 2019-06-07 (×7): qty 80

## 2019-06-07 MED ORDER — STERILE WATER FOR INJECTION IV SOLN
INTRAVENOUS | Status: DC
  Administered 2019-06-07: 19:00:00 via INTRAVENOUS
  Filled 2019-06-07 (×7): qty 850

## 2019-06-07 MED ORDER — MIDAZOLAM HCL 2 MG/2ML IJ SOLN: 2.0000 mg | INTRAMUSCULAR | Status: DC | PRN

## 2019-06-07 MED ORDER — HYDROMORPHONE HCL 1 MG/ML IJ SOLN
1.0000 mg | INTRAMUSCULAR | Status: DC | PRN
  Administered 2019-06-07: 1 mg via INTRAVENOUS
  Filled 2019-06-07: qty 1

## 2019-06-07 MED ORDER — SODIUM CHLORIDE 0.9% IV SOLUTION
Freq: Once | INTRAVENOUS | Status: AC
  Administered 2019-06-14: via INTRAVENOUS

## 2019-06-07 MED ORDER — IOHEXOL 350 MG/ML SOLN
100.0000 mL | Freq: Once | INTRAVENOUS | Status: AC | PRN
  Administered 2019-06-07: 100 mL via INTRAVENOUS

## 2019-06-07 MED ORDER — SODIUM CHLORIDE 0.9 % IV BOLUS
1000.0000 mL | Freq: Once | INTRAVENOUS | Status: AC
  Administered 2019-06-07: 15:00:00 1000 mL via INTRAVENOUS

## 2019-06-07 MED ORDER — NOREPINEPHRINE 4 MG/250ML-% IV SOLN
5.0000 ug/min | INTRAVENOUS | Status: DC
  Administered 2019-06-07: 6 ug/min via INTRAVENOUS
  Administered 2019-06-07: 5 ug/min via INTRAVENOUS
  Filled 2019-06-07: qty 250

## 2019-06-07 MED ORDER — VANCOMYCIN HCL 10 G IV SOLR
1250.0000 mg | Freq: Once | INTRAVENOUS | Status: DC
  Filled 2019-06-07: qty 1250

## 2019-06-07 MED ORDER — VANCOMYCIN HCL 500 MG IV SOLR
500.0000 mg | Freq: Once | INTRAVENOUS | Status: AC
  Administered 2019-06-07: 500 mg via INTRAVENOUS
  Filled 2019-06-07: qty 500

## 2019-06-07 MED ORDER — SODIUM CHLORIDE 0.9 % IV SOLN
80.0000 mg | Freq: Once | INTRAVENOUS | Status: AC
  Administered 2019-06-07: 80 mg via INTRAVENOUS
  Filled 2019-06-07: qty 80

## 2019-06-07 MED ORDER — PANTOPRAZOLE SODIUM 40 MG IV SOLR: 40.0000 mg | Freq: Every day | INTRAVENOUS | Status: DC

## 2019-06-07 MED ORDER — PROPOFOL 1000 MG/100ML IV EMUL
INTRAVENOUS | Status: AC
  Administered 2019-06-07: 5 ug/kg/min
  Filled 2019-06-07: qty 100

## 2019-06-07 MED ORDER — SODIUM BICARBONATE 8.4 % IV SOLN
150.0000 meq | Freq: Once | INTRAVENOUS | Status: AC
  Administered 2019-06-07: 17:00:00 150 meq via INTRAVENOUS

## 2019-06-07 MED ORDER — PIPERACILLIN-TAZOBACTAM 3.375 G IVPB
3.3750 g | Freq: Three times a day (TID) | INTRAVENOUS | Status: DC
  Administered 2019-06-07 – 2019-06-08 (×2): 3.375 g via INTRAVENOUS
  Filled 2019-06-07 (×4): qty 50

## 2019-06-07 MED ORDER — SODIUM CHLORIDE 0.9 % IV SOLN
250.0000 mL | INTRAVENOUS | Status: DC
  Administered 2019-06-07: 250 mL via INTRAVENOUS

## 2019-06-07 MED ORDER — SODIUM CHLORIDE 0.9 % IV BOLUS
1000.0000 mL | Freq: Once | INTRAVENOUS | Status: AC
  Administered 2019-06-07: 1000 mL via INTRAVENOUS

## 2019-06-07 MED ORDER — SODIUM CHLORIDE 0.9 % IV SOLN
INTRAVENOUS | Status: AC
  Administered 2019-06-07: 18:00:00 via INTRAVENOUS

## 2019-06-07 MED ORDER — PIPERACILLIN-TAZOBACTAM 3.375 G IVPB 30 MIN
3.3750 g | Freq: Once | INTRAVENOUS | Status: AC
  Administered 2019-06-07: 16:00:00 3.375 g via INTRAVENOUS
  Filled 2019-06-07: qty 50

## 2019-06-07 MED ORDER — HYDROMORPHONE HCL 1 MG/ML IJ SOLN
1.0000 mg | INTRAMUSCULAR | Status: DC | PRN
  Administered 2019-06-07 (×3): 1 mg via INTRAVENOUS
  Administered 2019-06-08: 2 mg via INTRAVENOUS
  Administered 2019-06-08: 1 mg via INTRAVENOUS
  Filled 2019-06-07: qty 1
  Filled 2019-06-07: qty 2
  Filled 2019-06-07 (×3): qty 1

## 2019-06-07 MED ORDER — SODIUM CHLORIDE 0.9% IV SOLUTION
Freq: Once | INTRAVENOUS | Status: AC
  Administered 2019-06-07: 15:00:00 10 mL/h via INTRAVENOUS

## 2019-06-07 MED ORDER — VANCOMYCIN HCL IN DEXTROSE 1-5 GM/200ML-% IV SOLN
1000.0000 mg | Freq: Once | INTRAVENOUS | Status: AC
  Administered 2019-06-07: 17:00:00 1000 mg via INTRAVENOUS
  Filled 2019-06-07: qty 200

## 2019-06-07 NOTE — ED Notes (Signed)
Ok to give unit of blood rapid bolus per MD

## 2019-06-07 NOTE — ED Provider Notes (Signed)
[facility] EMERGENCY DEPARTMENT Provider Note   CSN: [id number] Arrival date & time: 06/07/19  1359     History   Chief Complaint Chief Complaint  Patient presents with   Altered Mental Status    HPI DEFORREST Wright is a 53 y.o. male.     HPI  Patient is a critically F not deathly ill 53 year old male with known stage IIIb rectal adenocarcinoma who has been treated with multiple treatments up at [facility], in 2017 he was first found to have a colonoscopy with a firm rectal mass and a polyp in the rectum that was infiltrating and nonobstructing.  Then started chemotherapy cycles in May 2017, had robotic assisted coloanal anastomosis, admitted for sepsis in November 2017, there were multiple other interventions and problems that have occurred since that time.  He is currently cared for by Dr. Delton Coombes.  His last treatment with chemotherapy was on April 23, 2019.  He had not had any bleeding, no nausea vomiting diarrhea constipation, his pain has been poorly controlled and fentanyl patches were only lasting for 2 days, taking oxycodone on top of that.  He had CT-guided drain placement by interventional radiology on April 20, a left parasacral drain was placed in [facility] in July of this year, there is still bloody fluid draining from that area.  And CT scan in August 2020 showed that the drain on the left was still in a collection of fluid.  This was increasing in size, recommended restarting chemotherapy at that time.  He continued to have recurrent infections including urine Proteus and Enterococcus, history of microcytic anemia with a hemoglobin of 9 at that visit on May 13, 2019.  He presents to the hospital with essentially an acute event in which case the patient went into acute respiratory distress, there was a witness syncopal episode, severe tachycardia as high as 180 followed by loss of consciousness, paramedics found the patient with ongoing tachycardia  with a sinus tachycardia in the 130s, severely pale, hypoxic to 80% and unable to speak very much at all prehospital.  On arrival the patient is essentially nonverbal, there is a question as to the patient's CODE STATUS.  Past Medical History:  Diagnosis Date   Anxiety    Depression    Rectal cancer ([place]) 12/17/2015    Patient Active Problem List   Diagnosis Date Noted   Goals of care, counseling/discussion 10/11/2018   Lymphadenopathy, inguinal    Sepsis ([place]) 06/19/2016   UTI (urinary tract infection) 06/19/2016   Perirectal abscess 06/19/2016   Normocytic anemia 06/19/2016   Hyponatremia 06/19/2016   Depression with anxiety 06/19/2016   Rectal bleeding 06/19/2016   Fever of unknown origin (FUO) 06/19/2016   Sepsis, unspecified organism ([place]) 06/19/2016   S/P PICC central line placement 01/13/2016   Rectal cancer ([place]) 12/17/2015   Internal hemorrhoids 12/10/2013    Past Surgical History:  Procedure Laterality Date   ABDOMINAL PERINEAL BOWEL RESECTION  06/07/2016   [facility]   COLON SURGERY     DIVERTING ILEOSTOMY  06/07/2016   [facility]   INGUINAL LYMPH NODE BIOPSY Left 09/18/2018   Procedure: LEFT INGUINAL LYMPH NODE BIOPSY;  Surgeon: Aviva Signs, MD;  Location: [facility];  Service: General;  Laterality: Left;   IR RADIOLOGIST EVAL & MGMT  12/25/2018   LAPAROSCOPIC LOW ANTERIOR RESCECTION WITH COLOANAL ANASTOMOSIS  06/07/2016   [facility]   PERIPHERALLY INSERTED CENTRAL CATHETER INSERTION  11/2015  Home Medications    Prior to Admission medications   Medication Sig Start Date End Date Taking? Authorizing Provider  ALPRAZolam Duanne Moron) 1 MG tablet Take 0.5 tablets (0.5 mg total) by mouth 2 (two) times daily as needed for anxiety. 06/04/19  Yes Derek Jack, MD  fentaNYL (DURAGESIC) 100 MCG/HR Place 3 patches onto the skin every 3 (three) days. 05/29/19  Yes Derek Jack, MD  FLUoxetine (PROZAC) 40 MG capsule TAKE 1  CAPSULE(40 MG) BY MOUTH DAILY 04/21/19  Yes Derek Jack, MD  Oxycodone HCl 20 MG TABS Take 1 tablet (20 mg total) by mouth every 3 (three) hours as needed (severe pain). 06/04/19  Yes Derek Jack, MD  prochlorperazine (COMPAZINE) 10 MG tablet Take 1 tablet (10 mg total) by mouth every 6 (six) hours as needed for nausea or vomiting. 05/21/19  Yes Derek Jack, MD  sulfamethoxazole-trimethoprim (BACTRIM DS) 800-160 MG tablet Take 1 tablet by mouth 2 (two) times daily. 05/21/19  Yes Derek Jack, MD  ondansetron (ZOFRAN ODT) 4 MG disintegrating tablet Take 1 tablet (4 mg total) by mouth every 8 (eight) hours as needed for nausea or vomiting. Patient not taking: Reported on 06/07/2019 03/03/19   Derek Jack, MD    Family History Family History  Problem Relation Age of Onset   Rectal cancer Neg Hx     Social History Social History   Tobacco Use   Smoking status: Never Smoker   Smokeless tobacco: Never Used  Substance Use Topics   Alcohol use: Yes    Comment: occ.    Drug use: No     Allergies   Patient has no known allergies.   Review of Systems Review of Systems  Unable to perform ROS: Acuity of condition     Physical Exam Updated Vital Signs BP 92/76    Pulse (!) 130    Temp 97.8 F (36.6 C) (Axillary)    Resp 20    Ht 1.829 m (6')    Wt 74 kg    BMI 22.13 kg/m   Physical Exam Constitutional:      Comments: Ill-appearing, unresponsive  HENT:     Head:     Comments: Pale, atraumatic    Mouth/Throat:     Comments: Pale, poor dentition, dry mucous membranes Eyes:     Comments: Pupils reactive, the patient will not blink  Neck:     Comments: Supple neck, palpable carotids Cardiovascular:     Comments: Tachycardia to 130 bpm, weak peripheral pulses, hypotensive Pulmonary:     Comments: Shallow breathing, spontaneous breathing Abdominal:     Comments: Abdomen appears soft, multiple ostomies including urostomy and colostomy   Musculoskeletal:     Comments: Edema present with left lower leg much more swollen than right lower extremity  Skin:    Comments: Pale  Neurological:     Comments: Essentially unresponsive with occasional spontaneous movements of the arms or legs, will tear off his nonrebreather occasionally, does not talk, does not follow commands      ED Treatments / Results  Labs (all labs ordered are listed, but only abnormal results are displayed) Labs Reviewed  VITAMIN B12 - Abnormal; Notable for the following components:      Result Value   Vitamin B-12 1,202 (*)    All other components within normal limits  IRON AND TIBC - Abnormal; Notable for the following components:   TIBC 190 (*)    All other components within normal limits  RETICULOCYTES - Abnormal; Notable for the  following components:   RBC. 0.86 (*)    All other components within normal limits  CBC WITH DIFFERENTIAL/PLATELET - Abnormal; Notable for the following components:   WBC 84.8 (*)    RBC 0.86 (*)    Hemoglobin 2.7 (*)    HCT 9.9 (*)    MCV 115.1 (*)    MCHC 27.3 (*)    Platelets 729 (*)    nRBC 2.2 (*)    Neutro Abs 53.8 (*)    Lymphs Abs 11.6 (*)    Monocytes Absolute 4.9 (*)    Abs Immature Granulocytes 14.15 (*)    All other components within normal limits  CBG MONITORING, ED - Abnormal; Notable for the following components:   Glucose-Capillary 273 (*)    All other components within normal limits  CULTURE, BLOOD (ROUTINE X 2)  CULTURE, BLOOD (ROUTINE X 2)  SARS CORONAVIRUS 2 BY RT PCR (HOSPITAL ORDER, [place] [facility])  FERRITIN  FOLATE  COMPREHENSIVE METABOLIC PANEL  URINALYSIS, ROUTINE W REFLEX MICROSCOPIC  BLOOD GAS, ARTERIAL  TYPE AND SCREEN  PREPARE RBC (CROSSMATCH)  TROPONIN I (HIGH SENSITIVITY)    EKG EKG Interpretation  Date/Time:  Saturday June 07 2019 14:03:54 EDT Ventricular Rate:  128 PR Interval:    QRS Duration: 108 QT Interval:  314 QTC  Calculation: 459 R Axis:   84 Text Interpretation:  Sinus tachycardia Borderline low voltage, extremity leads Borderline ST depression, lateral leads Confirmed by Noemi Chapel [phone]) on 06/07/2019 2:49:30 PM   Radiology Ct Angio Chest Pe W And/or Wo Contrast  Result Date: 06/07/2019 CLINICAL DATA:  53 year old male with acute shortness of breath and syncope. EXAM: CT ANGIOGRAPHY CHEST WITH CONTRAST TECHNIQUE: Multidetector CT imaging of the chest was performed using the standard protocol during bolus administration of intravenous contrast. Multiplanar CT image reconstructions and MIPs were obtained to evaluate the vascular anatomy. CONTRAST:  144m OMNIPAQUE IOHEXOL 350 MG/ML SOLN COMPARISON:  05/29/2018 and prior CTs FINDINGS: Cardiovascular: This is a technically satisfactory study. Segmental pulmonary emboli are identified within the RIGHT LOWER lobe (at least 2 segments) and LEFT UPPER lobe (at least 1 segment), best identified on thin sections. Mild cardiomegaly is noted. There is no evidence of thoracic aortic aneurysm or pericardial effusion. Mediastinum/Nodes: An endotracheal tube is identified with tip 2 cm above the carina. An NG 2 is noted entering the stomach. No mediastinal mass or enlarged lymph nodes. Thyroid gland is unremarkable. Lungs/Pleura: Mild to moderate bilateral dependent and bibasilar opacities/atelectasis noted. No evidence of pulmonary mass, suspicious nodule, airspace disease, pleural effusion or pneumothorax. Upper Abdomen: No acute abnormality. Musculoskeletal: No acute or suspicious bony abnormality. Review of the MIP images confirms the above findings. IMPRESSION: 1. Segmental pulmonary emboli within the RIGHT LOWER lobe and LEFT UPPER lobe. 2. Mild to moderate bilateral dependent and bibasilar opacities/atelectasis. 3. Mild cardiomegaly. Critical Value/emergent results were called by telephone at the time of interpretation on 06/07/2019 at 3:24 pm to providerBRIAN Nino Amano  , who verbally acknowledged these results. Electronically Signed   By: JMargarette CanadaM.D.   On: 06/07/2019 15:29   Dg Chest Port 1 View  Result Date: 06/07/2019 CLINICAL DATA:  Shortness of breath. Rectal carcinoma. EXAM: PORTABLE CHEST 1 VIEW COMPARISON:  06/19/2016 FINDINGS: Right arm PICC line remains in appropriate position. Both lungs are clear. Heart size is normal. IMPRESSION: No active disease. Electronically Signed   By: JMarlaine HindM.D.   On: 06/07/2019 14:27    Procedures .Critical Care Performed  by: Noemi Chapel, MD Authorized by: Noemi Chapel, MD   Critical care provider statement:    Critical care time (minutes):  35   Critical care time was exclusive of:  Separately billable procedures and treating other patients and teaching time   Critical care was necessary to treat or prevent imminent or life-threatening deterioration of the following conditions:  Circulatory failure   Critical care was time spent personally by me on the following activities:  Blood draw for specimens, development of treatment plan with patient or surrogate, discussions with consultants, evaluation of patient's response to treatment, examination of patient, obtaining history from patient or surrogate, ordering and performing treatments and interventions, ordering and review of laboratory studies, ordering and review of radiographic studies, pulse oximetry, re-evaluation of patient's condition and review of old charts Comments:       Procedure Name: Intubation Date/Time: 06/07/2019 2:38 PM Performed by: Noemi Chapel, MD Pre-anesthesia Checklist: Patient identified, Patient being monitored, Emergency Drugs available and Suction available Oxygen Delivery Method: Ambu bag Preoxygenation: Pre-oxygenation with 100% oxygen Induction Type: Rapid sequence Ventilation: Mask ventilation without difficulty Laryngoscope Size: Mac and 4 Grade View: Grade III Tube size: 8.0 mm Number of attempts: 1 Airway  Equipment and Method: Stylet (direct laryngoscopy) Placement Confirmation: ETT inserted through vocal cords under direct vision,  CO2 detector and Breath sounds checked- equal and bilateral Secured at: 24 cm Tube secured with: ETT holder Dental Injury: Teeth and Oropharynx as per pre-operative assessment  Comments:      OG placement  Date/Time: 06/07/2019 2:39 PM Performed by: Noemi Chapel, MD Authorized by: Noemi Chapel, MD  Consent: The procedure was performed in an emergent situation. Required items: required blood products, implants, devices, and special equipment available Time out: Immediately prior to procedure a "time out" was called to verify the correct patient, procedure, equipment, support staff and site/side marked as required. Preparation: Patient was prepped and draped in the usual sterile fashion. Local anesthesia used: no  Anesthesia: Local anesthesia used: no Patient tolerance: patient tolerated the procedure well with no immediate complications Comments:  The patient was intubated, the OG tube was placed excessively on the first attempt, hooked up to suction with return of gastric contents.     (including critical care time)  Medications Ordered in ED Medications  norepinephrine (LEVOPHED) 83m in 2557mpremix infusion (10 mcg/min Intravenous Rate/Dose Change 06/07/19 1430)  vancomycin (VANCOCIN) IVPB 1000 mg/200 mL premix (has no administration in time range)  piperacillin-tazobactam (ZOSYN) IVPB 3.375 g (has no administration in time range)  sodium chloride 0.9 % bolus 1,000 mL (1,000 mLs Intravenous New Bag/Given 06/07/19 1528)  sodium chloride 0.9 % bolus 1,000 mL (0 mLs Intravenous Stopped 06/07/19 1528)  0.9 %  sodium chloride infusion (Manually program via Guardrails IV Fluids) (10 mL/hr Intravenous New Bag/Given 06/07/19 1528)  etomidate (AMIDATE) injection (20 mg Intravenous Given 06/07/19 1429)  rocuronium (ZEMURON) injection (100 mg Intravenous  Given 06/07/19 1429)  propofol (DIPRIVAN) 1000 MG/100ML infusion (5 mcg/kg/min  New Bag/Given 06/07/19 1507)  iohexol (OMNIPAQUE) 350 MG/ML injection 100 mL (100 mLs Intravenous Contrast Given 06/07/19 1438)     Initial Impression / Assessment and Plan / ED Course  I have reviewed the triage vital signs and the nursing notes.  Pertinent labs & imaging results that were available during my care of the patient were reviewed by me and considered in my medical decision making (see chart for details).  Clinical Course as of Jun 07 1531  Sat Jun 07, 2019  1507 Discussed the case with Dr. Delton Coombes, there was not much to add to the case at this time as the patient is requiring active resuscitation.  I personally looked at the CT angiogram and find no obvious pulmonary embolism.  The patient is currently receiving Levophed, IV fluids, being oxygenated and has a blood pressure of around 95 systolic on 10 mcg of Levophed.  I will add antibiotics and call for admission to the hospital.  I feel that this patient is a very poor prognosis.  He did have a hospice discussion with his family doctor however the patient had refused hospice at that time per Dr. Delton Coombes.  Hemoglobin is just resulted at 2.7, white blood cell count very elevated.   [BM]    Clinical Course User Index [BM] Noemi Chapel, MD       This severely ill is unfortunately doing very poorly, severely tachycardic and appears severely anemic, will obtain blood, chest x-ray, likely has had a pulmonary embolism or some other life-threatening event.  At this time he is getting fluid resuscitation, immediate blood transfusion assuming that that is okay and will talk to family on their arrival to further evaluate CODE STATUS.  This patient was intubated after discussion with the patient's significant other "partner" who is who is power of attorney according to the partner.  He would like for everything to be done as long as there is hope.  At  this time I believe the patient is had a massive pulmonary embolism or some other significant event.  He is going to CT angiogram at this time to further confirm the source of his downfall.  IVs have been placed, Levophed is currently being titrated and the patient is no longer hypotensive.  He does have slightly better color and a heart rate of 120.  Discussed the case with Dr. Maryjean Ka of the ICU service who is accepted the care of the patient in transfer to [facility].  Transfer orders have been written  Final Clinical Impressions(s) / ED Diagnoses   Final diagnoses:  Respiratory arrest [facility])  Severe anemia    ED Discharge Orders    None       Noemi Chapel, MD 06/07/19 [phone]

## 2019-06-07 NOTE — ED Notes (Signed)
Report given to Saralyn Pilar at [facility]

## 2019-06-07 NOTE — ED Triage Notes (Signed)
Pt brought in by ems for evaluation of altered mental status. Pt's family called ems for difficulty breathing and syncopal episode after ems arrival. Pt has advanced rectal cancer with multiple drainage tubes and frank rectal bleeding.

## 2019-06-07 NOTE — H&P (Signed)
NAME:  Jonathan Wright, MRN:  [id number], DOB:  [birth year], LOS: 0 ADMISSION DATE:  06/07/2019, CONSULTATION DATE:  06/07/2019 REFERRING MD:  Dr. Sabra Heck, CHIEF COMPLAINT:  Hypotension  Brief History   53 year old male with hx of metastatic rectal cancer presenting with respiratory distress and syncopal episode found to be hypoxic and hypotensive with workup showing patient severely anemic (Hgb 2.7), sepsis with WBC 84.8, acute PE, AKI, severe acidosis.  He remains a full code at this time.  He was intubated and requiring vasopressor support and transfused with 2 units of PRBC.  Transferred from [facility] to [facility] for higher level of care.   History of present illness   HPI obtained from medical chart review as patient is intubated and sedated on mechanical ventilation.    53 year old male with history of stage IIIB rectal adenocarcinoma, [id number], diagnosed in 08/2015 and underwent 5Gyx 5 and diverting ileostomy at [facility] initially.  He is currently followed by Dr. Delton Coombes (at [facility]) with most recent chemotherapy 04/23/2019 with palliative FOLFIRI and bevacizumab with no reported no bleeding, nausea, vomiting, or bowel changes and poorly controlled pain despite his fentanyl patches and oxycodone at that time. Additionally in September, he had reported foul smelling discharge leaking from the sides of the drainage tubes for 2 weeks. Of note, patient had CT guided drain placement by IR in April and then a left parasacral drain at [facility] in July with ongoing bloody drainage. [facility] on the left buttock was DC in August at sometime. Drain on left buttock from [facility] still in place. Also of note patient with colostomy and urostomy placed in 2019 with his multiple revisions and surgeries. Had flap dissection on right inner thigh in 2019 that continues to drain fluid and seems to be [progressing per the partner.  CT in August demonstrated progression of left pelvic floor fluid collection/ lesion and fistula and  therefore chemo restarted.  He was noted to have a hemoglobin of 9 with his microcytic anemia in September.  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 [facility]OC were approached with patient in September, however he refused hospice care at that time.   He presented from home today with acute decompensation with respiratory distress and witnessed syncopal episode and noted to be extremely pale, hypoxic in the 80's.  In ER, he required fluids and vasopressor support for ongoing hypotension.  Noted to be afebrile.  Labs noted WBC 84.8K, Hgb 2.7, Hct 9.9, platelets 729, Na 128, K 5.7, CO2 <7, glucose 315, sCr 2.27, BUN 36, alk phos 139, t bili 0.1, hs trop 9- 15, LA 5.3.  Coags, UA pending.  CTA PE showed segmental PE in the RLL and LUL with bilateral bibasilar opacities/ atelectasis.  He remained altered in the ER with ongoing shock.  Code status discussed with patient's significant other who wished for ongoing aggressive care as long as there "was hope".  Therefore he was intubated.  Transfused with 2 units of emergency released blood.  He was noted to be severely acidotic with ABG 6.88/ 24.2/ 63.1 and undetectable bicarb. He was placed on bicarb gtt. Reports of rectal bleeding, and blood around colostomy and urostomy sites.  He was transferred to [facility] for higher level of care, PCCM accepted.   Past Medical History  metastatic Stage IIIb rectal adenocarcinoma, recurrent UTI, microcytic anemia, anxiety, depression  Significant Hospital Events   10/17 Admitted/ tx to [facility] from [facility]  Consults:   Procedures:  10/17 ETT >>  Significant Diagnostic Tests:  10/17 CTA PE >> 1. Segmental pulmonary emboli within the RIGHT LOWER lobe and LEFT UPPER lobe. 2. Mild to moderate bilateral dependent and bibasilar opacities/atelectasis. 3. Mild cardiomegaly.  10/17 CTH >> 10/17 CTA a/p >> 10/17 CT pelvis/ right thigh >>  Micro Data:  10/17 SARS CoV2 >>  neg 10/17 MRSA PCR >> 10/17 BCx 2 >> 10/17 UC >> 10/17 fluid cx from drains  Antimicrobials:  10/17 vanc >> 10/17 zosyn >>  Interim history/subjective:  S/p 2 units emergency release blood, temporarily off levophed, remains on bicarb gtt.   Objective   Blood pressure 99/79, pulse (!) 114, temperature (!) 96.5 F (35.8 C), temperature source Axillary, resp. rate (!) 21, height 6' (1.829 m), weight 73.3 kg, SpO2 92 %.    Vent Mode: PRVC FiO2 (%):  [100 %] 100 % Set Rate:  [20 bmp-24 bmp] 24 bmp Vt Set:  [620 mL] 620 mL PEEP:  [5 cmH20] 5 cmH20 Plateau Pressure:  [17 cmH20] 17 cmH20   Intake/Output Summary (Last 24 hours) at 06/07/2019 1919 Last data filed at 06/07/2019 1638 Gross per 24 hour  Intake 2945 ml  Output --  Net 2945 ml   Filed Weights   06/07/19 1416 06/07/19 1800  Weight: 74 kg 73.3 kg   Examination: General: awake and answering question HENT: Patietn with moist mucous membranes Lungs: Clear to auscultation Cardiovascular: RRR Abdomen: soft non tender non distended. There is midline scar. Has urostomy and colostomy in place. 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 Outpult from right buttock with sanguinous drainiage. Maroon stools noted in colostomy pouch.  Extremities: Warm well perfused Neuro: CN 2-12 intact GU: Urostomy noted  Resolved Hospital Problem list    NA  Assessment & Plan:   This is a 53 yo male with history of stage 3 b cancer was first diagnosed in 2017 has had chemo and even a coloanal anastamosis. Significant history as noted above. Presents for GI bleed, septic shock and PE with multiple metabolic abnormalities.   Acute GI bleed. Patient with Output from ostomy concerning for melena and drop in hgb from 9 to 2.2 with  -Protonix drip -Trend Hgb Q 6hrs -If not improving appropriately will consult GI and will attain CT abdomen with contrast to assess for bleed -Will  hold off on [facility] for tonight reassess in AM.  -Attain INR/PT and PTT   Septic shock-Patient with Leukocytosis and hypotension and LA leak -Trend lactic acid -Broad spectum abx with cipro flagyl and Vancomycin -Culture blood, culture drains, culture urine -C diff   PE-Patient with segmental PE noted on scan. However troponin negative.  -Will attain echo to assess for right heart strain -[place] lower extremities odered  AKI-Seems to be improving with fluids. Creatinine had increased from 1.24 to 2.27 -Avoid nephrotoxins -Would avouid contrast however in the setting of GI bleed and acuity risks outweight benefit   Respiratory failure-2/2 lactic acidosis -Continue low tidal volume vent settings -ABG Q 6hours for now  Lactic acidosis-Likely marker of tissue mal perfusion in setting of acute blood loss. Likely GI bleed vs bleed from drain -Will be generous with fluids -Trend lactic  Stage 3 rectal adenocarcinoma recently finished chemo this past September. It is apllaitive. Per chart review sdiscussion concerning palliative had been done. Patient wanted full measures at that time.Continue to want full measures after discussing poor outlook. They  would like any surgical intervention that may help current situation -Continue supportive care -Reach out to IR and surgery in the AM     Best practice:  Diet: NPO Pain/Anxiety/Delirium protocol (if indicated): RASS goal 0/-1, fentanyl/ versed VAP protocol (if indicated): yes DVT prophylaxis: unable at this time given ABLA GI prophylaxis: PPI bolus then gtt Glucose control: SSI / CBG q 4 Mobility: BR Code Status: Full per patient and significant other Jonathan Wright Family Communication: Significant other at bedside, [name] [phone] Disposition: ICU  Labs   CBC: Recent Labs  Lab 06/07/19 1420  WBC 84.8*  NEUTROABS 53.8*  HGB 2.7*  HCT 9.9*  MCV 115.1*  PLT 729*    Basic Metabolic Panel: Recent Labs  Lab  06/07/19 1420  NA 128*  K 5.7*  CL 98  CO2 <7*  GLUCOSE 315*  BUN 36*  CREATININE 2.27*  CALCIUM 8.2*   GFR: Estimated Creatinine Clearance: 39.5 mL/min (A) (by C-G formula based on SCr of 2.27 mg/dL (H)). Recent Labs  Lab 06/07/19 1420  WBC 84.8*    Liver Function Tests: Recent Labs  Lab 06/07/19 1420  AST 31  ALT <5  ALKPHOS 139*  BILITOT 0.1*  PROT 4.5*  ALBUMIN 1.4*   No results for input(s): LIPASE, AMYLASE in the last 168 hours. No results for input(s): AMMONIA in the last 168 hours.  ABG    Component Value Date/Time   PHART 6.888 (LL) 06/07/2019 1512   PCO2ART 24.2 (L) 06/07/2019 1512   PO2ART 63.1 (L) 06/07/2019 1512   HCO3 NOT CALCULATED 06/07/2019 1512   ACIDBASEDEF 25.8 (H) 06/07/2019 1512   O2SAT 66.4 06/07/2019 1512     Coagulation Profile: No results for input(s): INR, PROTIME in the last 168 hours.  Cardiac Enzymes: No results for input(s): CKTOTAL, CKMB, CKMBINDEX, TROPONINI in the last 168 hours.  HbA1C: No results found for: HGBA1C  CBG: Recent Labs  Lab 06/07/19 1419  GLUCAP 273*    Review of Systems:   Unable to assess.   Past Medical History  He,  has a past medical history of Anxiety, Depression, and Rectal cancer ([place]) (12/17/2015).   Surgical History    Past Surgical History:  Procedure Laterality Date   ABDOMINAL PERINEAL BOWEL RESECTION  06/07/2016   [facility]   COLON SURGERY     DIVERTING ILEOSTOMY  06/07/2016   [facility]   INGUINAL LYMPH NODE BIOPSY Left 09/18/2018   Procedure: LEFT INGUINAL LYMPH NODE BIOPSY;  Surgeon: Aviva Signs, MD;  Location: [facility];  Service: General;  Laterality: Left;   IR RADIOLOGIST EVAL & MGMT  12/25/2018   LAPAROSCOPIC LOW ANTERIOR RESCECTION WITH COLOANAL ANASTOMOSIS  06/07/2016   [facility]   PERIPHERALLY INSERTED CENTRAL CATHETER INSERTION  11/2015     Social History   reports that he has never smoked. He has never used smokeless tobacco. He reports current  alcohol use. He reports that he does not use drugs.   Family History   His family history is negative for Rectal cancer.   Allergies No Known Allergies   Home Medications  Prior to Admission medications   Medication Sig Start Date End Date Taking? Authorizing Provider  ALPRAZolam Duanne Moron) 1 MG tablet Take 0.5 tablets (0.5 mg total) by mouth 2 (two) times daily as needed for anxiety. 06/04/19  Yes Derek Jack, MD  fentaNYL (DURAGESIC) 100 MCG/HR Place 3 patches onto the skin every 3 (three) days. 05/29/19  Yes Derek Jack, MD  FLUoxetine [facility])  40 MG capsule TAKE 1 CAPSULE(40 MG) BY MOUTH DAILY 04/21/19  Yes Derek Jack, MD  Oxycodone HCl 20 MG TABS Take 1 tablet (20 mg total) by mouth every 3 (three) hours as needed (severe pain). 06/04/19  Yes Derek Jack, MD  prochlorperazine (COMPAZINE) 10 MG tablet Take 1 tablet (10 mg total) by mouth every 6 (six) hours as needed for nausea or vomiting. 05/21/19  Yes Derek Jack, MD  sulfamethoxazole-trimethoprim (BACTRIM DS) 800-160 MG tablet Take 1 tablet by mouth 2 (two) times daily. 05/21/19  Yes Derek Jack, MD  ondansetron (ZOFRAN ODT) 4 MG disintegrating tablet Take 1 tablet (4 mg total) by mouth every 8 (eight) hours as needed for nausea or vomiting. Patient not taking: Reported on 06/07/2019 03/03/19   Derek Jack, MD     Critical care time: 120 minutes

## 2019-06-07 NOTE — Progress Notes (Signed)
[facility] Progress Note Patient Name: Jonathan Wright DOB: 1965/11/25 MRN: [id number]   Date of Service  06/07/2019  HPI/Events of Note  Anemia - Hgb = 7.3 post transfusion. Request for H/H at midnight.   eICU Interventions  Will order H/H at midnight.      Intervention Category Major Interventions: Other:  [name] 06/07/2019, 9:18 PM

## 2019-06-07 NOTE — ED Notes (Signed)
Date and time results received: 06/07/19 305 (use smartphrase ".now" to insert current time)  Test: wbc and hemo Critical Value: 84.8 and 2.7  Name of Provider Notified: miller  Orders Received? Or Actions Taken?: see chart

## 2019-06-07 NOTE — Code Documentation (Signed)
Levophed increased per Dr. Sabra Heck for intubation.

## 2019-06-07 NOTE — ED Notes (Signed)
Pt noted to now have active bleeding from area under scrotum.

## 2019-06-07 NOTE — Progress Notes (Signed)
Pharmacy Antibiotic Note  Jonathan Wright is a 54 y.o. male admitted on 06/07/2019 with GIB and septic shock. Pharmacy has been consulted for Zosyn and vancomycin dosing. Pt noted to have AKI which is improving with fluids. Pt received vancomycin 1000mg  in ED already.  Plan: Vancomycin 500mg  IV x1 now to complete load  Vancomycin 1250mg  IV q24h starting 10/18 - est AUC 469 Zosyn 3.375g IV EI q8h   Height: 6' (182.9 cm) Weight: 161 lb 9.6 oz (73.3 kg) IBW/kg (Calculated) : 77.6  Temp (24hrs), Avg:96.2 F (35.7 C), Min:94.4 F (34.7 C), Max:97.8 F (36.6 C)  Recent Labs  Lab 06/07/19 1420 06/07/19 1913  WBC 84.8* 53.8*  CREATININE 2.27* 1.74*  LATICACIDVEN  --  5.3*    Estimated Creatinine Clearance: 51.5 mL/min (A) (by C-G formula based on SCr of 1.74 mg/dL (H)).    No Known Allergies  Antimicrobials this admission: Vancomycin 10/17 >> Zosyn 10/17 >>   Microbiology results: pending  Thank you for allowing pharmacy to be a part of this patient's care.  Arrie Senate, PharmD, BCPS Clinical Pharmacist [phone] Please check AMION for all [place] numbers 06/07/2019

## 2019-06-07 NOTE — ED Notes (Signed)
Date and time results received: 06/07/19 4:03 PM  (use smartphrase ".now" to insert current time)  Test: pH Critical Value: 6.88  Name of Provider Notified: Maryjean Ka  Orders Received? Or Actions Taken?: Orders Received - See Orders for details

## 2019-06-08 ENCOUNTER — Inpatient Hospital Stay (HOSPITAL_COMMUNITY): Payer: BC Managed Care – PPO

## 2019-06-08 ENCOUNTER — Encounter (HOSPITAL_COMMUNITY): Payer: Self-pay | Admitting: Diagnostic Radiology

## 2019-06-08 DIAGNOSIS — R0603 Acute respiratory distress: Secondary | ICD-10-CM

## 2019-06-08 DIAGNOSIS — R6521 Severe sepsis with septic shock: Secondary | ICD-10-CM

## 2019-06-08 DIAGNOSIS — D62 Acute posthemorrhagic anemia: Secondary | ICD-10-CM

## 2019-06-08 DIAGNOSIS — A419 Sepsis, unspecified organism: Principal | ICD-10-CM

## 2019-06-08 HISTORY — PX: IR US GUIDE VASC ACCESS LEFT: IMG2389

## 2019-06-08 HISTORY — PX: IR IVC FILTER PLMT / S&I /IMG GUID/MOD SED: IMG701

## 2019-06-08 HISTORY — PX: IR ANGIOGRAM SELECTIVE EACH ADDITIONAL VESSEL: IMG667

## 2019-06-08 HISTORY — PX: IR ANGIOGRAM PELVIS SELECTIVE OR SUPRASELECTIVE: IMG661

## 2019-06-08 HISTORY — PX: IR EMBO ART  VEN HEMORR LYMPH EXTRAV  INC GUIDE ROADMAPPING: IMG5450

## 2019-06-08 LAB — C DIFFICILE QUICK SCREEN W PCR REFLEX
C Diff antigen: POSITIVE — AB
C Diff toxin: NEGATIVE

## 2019-06-08 LAB — GLUCOSE, CAPILLARY
Glucose-Capillary: 109 mg/dL — ABNORMAL HIGH (ref 70–99)
Glucose-Capillary: 110 mg/dL — ABNORMAL HIGH (ref 70–99)
Glucose-Capillary: 126 mg/dL — ABNORMAL HIGH (ref 70–99)

## 2019-06-08 LAB — URINALYSIS, ROUTINE W REFLEX MICROSCOPIC
Bacteria, UA: NONE SEEN
Bilirubin Urine: NEGATIVE
Glucose, UA: NEGATIVE mg/dL
Hgb urine dipstick: NEGATIVE
Ketones, ur: NEGATIVE mg/dL
Nitrite: NEGATIVE
Protein, ur: NEGATIVE mg/dL
Specific Gravity, Urine: 1.033 — ABNORMAL HIGH (ref 1.005–1.030)
pH: 8 (ref 5.0–8.0)

## 2019-06-08 LAB — CBC
HCT: 21.5 % — ABNORMAL LOW (ref 39.0–52.0)
HCT: 16.3 % — ABNORMAL LOW (ref 39.0–52.0)
HCT: 23.9 % — ABNORMAL LOW (ref 39.0–52.0)
HCT: 24.9 % — ABNORMAL LOW (ref 39.0–52.0)
Hemoglobin: 7.9 g/dL — ABNORMAL LOW (ref 13.0–17.0)
Hemoglobin: 5.6 g/dL — CL (ref 13.0–17.0)
Hemoglobin: 8.3 g/dL — ABNORMAL LOW (ref 13.0–17.0)
Hemoglobin: 8.7 g/dL — ABNORMAL LOW (ref 13.0–17.0)
MCH: 31.3 pg (ref 26.0–34.0)
MCH: 31.3 pg (ref 26.0–34.0)
MCH: 31 pg (ref 26.0–34.0)
MCH: 30.7 pg (ref 26.0–34.0)
MCHC: 36.7 g/dL — ABNORMAL HIGH (ref 30.0–36.0)
MCHC: 34.4 g/dL (ref 30.0–36.0)
MCHC: 34.7 g/dL (ref 30.0–36.0)
MCHC: 34.9 g/dL (ref 30.0–36.0)
MCV: 85.3 fL (ref 80.0–100.0)
MCV: 91.1 fL (ref 80.0–100.0)
MCV: 89.2 fL (ref 80.0–100.0)
MCV: 88 fL (ref 80.0–100.0)
Platelets: 358 10*3/uL (ref 150–400)
Platelets: 235 10*3/uL (ref 150–400)
Platelets: 248 10*3/uL (ref 150–400)
Platelets: 205 10*3/uL (ref 150–400)
RBC: 2.52 MIL/uL — ABNORMAL LOW (ref 4.22–5.81)
RBC: 1.79 MIL/uL — ABNORMAL LOW (ref 4.22–5.81)
RBC: 2.68 MIL/uL — ABNORMAL LOW (ref 4.22–5.81)
RBC: 2.83 MIL/uL — ABNORMAL LOW (ref 4.22–5.81)
RDW: 17.3 % — ABNORMAL HIGH (ref 11.5–15.5)
RDW: 18.7 % — ABNORMAL HIGH (ref 11.5–15.5)
RDW: 17.9 % — ABNORMAL HIGH (ref 11.5–15.5)
RDW: 17.4 % — ABNORMAL HIGH (ref 11.5–15.5)
WBC: 42.3 10*3/uL — ABNORMAL HIGH (ref 4.0–10.5)
WBC: 30.3 10*3/uL — ABNORMAL HIGH (ref 4.0–10.5)
WBC: 37.6 10*3/uL — ABNORMAL HIGH (ref 4.0–10.5)
WBC: 34.4 10*3/uL — ABNORMAL HIGH (ref 4.0–10.5)
nRBC: 2.5 % — ABNORMAL HIGH (ref 0.0–0.2)
nRBC: 1.2 % — ABNORMAL HIGH (ref 0.0–0.2)
nRBC: 1.1 % — ABNORMAL HIGH (ref 0.0–0.2)
nRBC: 0.6 % — ABNORMAL HIGH (ref 0.0–0.2)

## 2019-06-08 LAB — COMPREHENSIVE METABOLIC PANEL
ALT: 16 U/L (ref 0–44)
ALT: 15 U/L (ref 0–44)
AST: 30 U/L (ref 15–41)
AST: 22 U/L (ref 15–41)
Albumin: 1.2 g/dL — ABNORMAL LOW (ref 3.5–5.0)
Albumin: 1.1 g/dL — ABNORMAL LOW (ref 3.5–5.0)
Alkaline Phosphatase: 123 U/L (ref 38–126)
Alkaline Phosphatase: 106 U/L (ref 38–126)
Anion gap: 14 (ref 5–15)
Anion gap: 9 (ref 5–15)
BUN: 25 mg/dL — ABNORMAL HIGH (ref 6–20)
BUN: 21 mg/dL — ABNORMAL HIGH (ref 6–20)
CO2: 22 mmol/L (ref 22–32)
CO2: 21 mmol/L — ABNORMAL LOW (ref 22–32)
Calcium: 6.6 mg/dL — ABNORMAL LOW (ref 8.9–10.3)
Calcium: 6.2 mg/dL — CL (ref 8.9–10.3)
Chloride: 103 mmol/L (ref 98–111)
Chloride: 110 mmol/L (ref 98–111)
Creatinine, Ser: 1.56 mg/dL — ABNORMAL HIGH (ref 0.61–1.24)
Creatinine, Ser: 1.41 mg/dL — ABNORMAL HIGH (ref 0.61–1.24)
GFR calc Af Amer: 58 mL/min — ABNORMAL LOW (ref >60)
GFR calc Af Amer: >60 mL/min (ref >60)
GFR calc non Af Amer: 50 mL/min — ABNORMAL LOW (ref >60)
GFR calc non Af Amer: 57 mL/min — ABNORMAL LOW (ref >60)
Glucose, Bld: 124 mg/dL — ABNORMAL HIGH (ref 70–99)
Glucose, Bld: 127 mg/dL — ABNORMAL HIGH (ref 70–99)
Potassium: 3.7 mmol/L (ref 3.5–5.1)
Potassium: 3.6 mmol/L (ref 3.5–5.1)
Sodium: 139 mmol/L (ref 135–145)
Sodium: 140 mmol/L (ref 135–145)
Total Bilirubin: 1.3 mg/dL — ABNORMAL HIGH (ref 0.3–1.2)
Total Bilirubin: 1 mg/dL (ref 0.3–1.2)
Total Protein: 4 g/dL — ABNORMAL LOW (ref 6.5–8.1)
Total Protein: 3.7 g/dL — ABNORMAL LOW (ref 6.5–8.1)

## 2019-06-08 LAB — BPAM RBC
Blood Product Expiration Date: 202010292359
Blood Product Expiration Date: 202011012359
ISSUE DATE / TIME: 202010171519
ISSUE DATE / TIME: 202010171559
Unit Type and Rh: 6200
Unit Type and Rh: 6200

## 2019-06-08 LAB — POCT I-STAT 7, (LYTES, BLD GAS, ICA,H+H)
Acid-Base Excess: 7 mmol/L — ABNORMAL HIGH (ref 0.0–2.0)
Bicarbonate: 27 mmol/L (ref 20.0–28.0)
Calcium, Ion: 0.95 mmol/L — ABNORMAL LOW (ref 1.15–1.40)
HCT: 25 % — ABNORMAL LOW (ref 39.0–52.0)
Hemoglobin: 8.5 g/dL — ABNORMAL LOW (ref 13.0–17.0)
O2 Saturation: 100 %
Patient temperature: 98.6
Potassium: 3.5 mmol/L (ref 3.5–5.1)
Sodium: 137 mmol/L (ref 135–145)
TCO2: 28 mmol/L (ref 22–32)
pCO2 arterial: 21.2 mmHg — ABNORMAL LOW (ref 32.0–48.0)
pH, Arterial: 7.713 (ref 7.350–7.450)
pO2, Arterial: 217 mmHg — ABNORMAL HIGH (ref 83.0–108.0)

## 2019-06-08 LAB — PROTIME-INR
INR: 1.5 — ABNORMAL HIGH (ref 0.8–1.2)
Prothrombin Time: 17.6 seconds — ABNORMAL HIGH (ref 11.4–15.2)

## 2019-06-08 LAB — ECHOCARDIOGRAM COMPLETE
Height: 72 in
Weight: 2585.55 oz

## 2019-06-08 LAB — TYPE AND SCREEN
ABO/RH(D): A POS
Antibody Screen: NEGATIVE
Unit division: 0
Unit division: 0

## 2019-06-08 LAB — MAGNESIUM: Magnesium: 1.8 mg/dL (ref 1.7–2.4)

## 2019-06-08 LAB — PREPARE RBC (CROSSMATCH)

## 2019-06-08 LAB — PHOSPHORUS: Phosphorus: 3.4 mg/dL (ref 2.5–4.6)

## 2019-06-08 LAB — CK
Total CK: 261 U/L (ref 49–397)
Total CK: 127 U/L (ref 49–397)

## 2019-06-08 LAB — CLOSTRIDIUM DIFFICILE BY PCR, REFLEXED: Toxigenic C. Difficile by PCR: POSITIVE — AB

## 2019-06-08 LAB — LACTIC ACID, PLASMA: Lactic Acid, Venous: 1.5 mmol/L (ref 0.5–1.9)

## 2019-06-08 MED ORDER — MAGNESIUM SULFATE 2 GM/50ML IV SOLN: 2.0000 g | Freq: Once | INTRAVENOUS | Status: DC

## 2019-06-08 MED ORDER — SODIUM CHLORIDE 0.9% IV SOLUTION
Freq: Once | INTRAVENOUS | Status: AC
  Administered 2019-06-08: 11:00:00 via INTRAVENOUS

## 2019-06-08 MED ORDER — FENTANYL CITRATE (PF) 100 MCG/2ML IJ SOLN
INTRAMUSCULAR | Status: AC
  Filled 2019-06-08: qty 2

## 2019-06-08 MED ORDER — GELATIN ABSORBABLE 12-7 MM EX MISC
CUTANEOUS | Status: AC
  Administered 2019-06-08: 17:00:00
  Filled 2019-06-08: qty 1

## 2019-06-08 MED ORDER — LINEZOLID 600 MG/300ML IV SOLN
600.0000 mg | Freq: Two times a day (BID) | INTRAVENOUS | Status: DC
  Administered 2019-06-08 – 2019-06-12 (×8): 600 mg via INTRAVENOUS
  Filled 2019-06-08 (×8): qty 300

## 2019-06-08 MED ORDER — IOHEXOL 300 MG/ML  SOLN
150.0000 mL | Freq: Once | INTRAMUSCULAR | Status: AC | PRN
  Administered 2019-06-08: 70 mL via INTRA_ARTERIAL

## 2019-06-08 MED ORDER — LIDOCAINE HCL 1 % IJ SOLN
INTRAMUSCULAR | Status: AC
  Administered 2019-06-08: 17:00:00
  Filled 2019-06-08: qty 20

## 2019-06-08 MED ORDER — IOHEXOL 300 MG/ML  SOLN
100.0000 mL | Freq: Once | INTRAMUSCULAR | Status: AC | PRN
  Administered 2019-06-08: 30 mL via INTRAVENOUS

## 2019-06-08 MED ORDER — FENTANYL CITRATE (PF) 100 MCG/2ML IJ SOLN
INTRAMUSCULAR | Status: AC | PRN
  Administered 2019-06-08 (×3): 50 ug via INTRAVENOUS

## 2019-06-08 MED ORDER — CHLORHEXIDINE GLUCONATE 0.12% ORAL RINSE (MEDLINE KIT)
15.0000 mL | Freq: Two times a day (BID) | OROMUCOSAL | Status: DC
  Administered 2019-06-08 – 2019-06-14 (×11): 15 mL via OROMUCOSAL

## 2019-06-08 MED ORDER — FENTANYL CITRATE (PF) 100 MCG/2ML IJ SOLN: 50.0000 ug | Freq: Once | INTRAMUSCULAR | Status: DC

## 2019-06-08 MED ORDER — FENTANYL BOLUS VIA INFUSION
50.0000 ug | INTRAVENOUS | Status: DC | PRN
  Administered 2019-06-08 – 2019-06-09 (×3): 50 ug via INTRAVENOUS
  Filled 2019-06-08: qty 50

## 2019-06-08 MED ORDER — ALTEPLASE 2 MG IJ SOLR
2.0000 mg | INTRAMUSCULAR | Status: AC
  Administered 2019-06-08 (×2): 2 mg
  Filled 2019-06-08: qty 2

## 2019-06-08 MED ORDER — CALCIUM GLUCONATE-NACL 1-0.675 GM/50ML-% IV SOLN
1.0000 g | Freq: Once | INTRAVENOUS | Status: AC
  Administered 2019-06-08: 1000 mg via INTRAVENOUS
  Filled 2019-06-08: qty 50

## 2019-06-08 MED ORDER — SODIUM CHLORIDE 0.9 % IV SOLN
2.0000 g | Freq: Three times a day (TID) | INTRAVENOUS | Status: AC
  Administered 2019-06-08 – 2019-06-14 (×20): 2 g via INTRAVENOUS
  Filled 2019-06-08 (×21): qty 2

## 2019-06-08 MED ORDER — FENTANYL CITRATE (PF) 100 MCG/2ML IJ SOLN
INTRAMUSCULAR | Status: AC | PRN
  Administered 2019-06-08: 50 ug via INTRAVENOUS

## 2019-06-08 MED ORDER — ORAL CARE MOUTH RINSE
15.0000 mL | OROMUCOSAL | Status: DC
  Administered 2019-06-08 – 2019-06-09 (×14): 15 mL via OROMUCOSAL

## 2019-06-08 MED ORDER — IOHEXOL 300 MG/ML  SOLN
80.0000 mL | Freq: Once | INTRAMUSCULAR | Status: AC | PRN
  Administered 2019-06-08: 80 mL via INTRAVENOUS

## 2019-06-08 MED ORDER — PROCHLORPERAZINE EDISYLATE 10 MG/2ML IJ SOLN
10.0000 mg | Freq: Four times a day (QID) | INTRAMUSCULAR | Status: DC | PRN
  Administered 2019-06-09 – 2019-06-12 (×2): 10 mg via INTRAVENOUS
  Filled 2019-06-08 (×2): qty 2

## 2019-06-08 MED ORDER — VANCOMYCIN HCL 10 G IV SOLR
1250.0000 mg | INTRAVENOUS | Status: DC
  Filled 2019-06-08: qty 1250

## 2019-06-08 MED ORDER — METRONIDAZOLE IN NACL 5-0.79 MG/ML-% IV SOLN
500.0000 mg | Freq: Three times a day (TID) | INTRAVENOUS | Status: DC
  Administered 2019-06-08 – 2019-06-12 (×12): 500 mg via INTRAVENOUS
  Filled 2019-06-08 (×12): qty 100

## 2019-06-08 MED ORDER — IODIXANOL 320 MG/ML IV SOLN
100.0000 mL | Freq: Once | INTRAVENOUS | Status: AC | PRN
  Administered 2019-06-08: 54 mL via INTRA_ARTERIAL

## 2019-06-08 MED ORDER — FENTANYL 2500MCG IN NS 250ML (10MCG/ML) PREMIX INFUSION
50.0000 ug/h | INTRAVENOUS | Status: DC
  Administered 2019-06-08 – 2019-06-09 (×2): 150 ug/h via INTRAVENOUS
  Filled 2019-06-08 (×2): qty 250

## 2019-06-08 MED ORDER — VANCOMYCIN 50 MG/ML ORAL SOLUTION
500.0000 mg | Freq: Four times a day (QID) | ORAL | Status: DC
  Administered 2019-06-08 – 2019-06-17 (×36): 500 mg via ORAL
  Filled 2019-06-08 (×43): qty 10

## 2019-06-08 NOTE — Progress Notes (Signed)
CRITICAL VALUE ALERT  Critical Value: Hbg 5.6  Date & Time Notied:  06/08/2019 @1035   Provider Notified: Dr. Rodman Pickle  Orders Received/Actions taken: 2 units PRBC's

## 2019-06-08 NOTE — Sedation Documentation (Signed)
Gauze/tegaderm bandages applied to groin puncture sites. Drsgs CDI. Groins level 0.

## 2019-06-08 NOTE — Consult Note (Signed)
Chief Complaint: Patient was seen in consultation today for image guided internal iliac artery pseudoaneurysm embolization and inferior vena cava filter placement.  Referring Physician(s): Dr. Loanne Drilling  Supervising Physician: Markus Daft  Patient Status: [facility] - In-pt  History of Present Illness: Jonathan Wright is a 53 y.o. male with a past medical history significant for anxiety, depression and stage IIIb rectal cancer followed by Dr. Delton Coombes, chronic pelvic abscess with fistulization s/p presacral abscess drain placed 12/09/18 in IR and several other drains previously placed at [facility] who presented to [facility] ED yesterday with complaints of AMS. He was at home when his family noticed he began to have trouble breathing and passed out - upon EMS arrival he was tachycardic (180s), pale, hypoxic (~80%) and was essentially nonverbal which was different from his baseline. He was intubated in the ED and started on IVF/pressors. Initial labs showed WBC 84.8, hgb 2.7, plt 729, K+ 5.7, Na+ 128, creatinine 2.27, COVID (-). CT head showed no acute intracranial abnormality. CTA chest showed segmental pulmonary emboli within the right lower lobe and left upper lobe, mild to moderate bilateral dependent and bibasilar opacities/atelectasis and mild cardiomegaly. He was transferred to [facility] for further management and admitted to the ICU.  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 Consult has been placed to IR for pelvic abscess evaluation, pseudoaneurysm evaluation and possible IVC filter placement.   Patient seen in ICU - currently undergoing TTE.  Daughter, Jonathan Wright, at bedside and son, Jonathan Wright, downstairs in lobby - met with both children in lobby to discuss their father's care/possible procedures. Per children patient has not been feeling well for some time and has continued to become progressively weaker. They state they were told this was likely due to the chemotherapy and would improve soon, however his pain continued to be poorly controlled and he has begun to sleep much more than previously. His son Jonathan Wright states that he has received care for his cancer at several institutions since diagnosis including the [facility], [facility] and another facility in [place] that he cannot recall the name of. Discussed procedures with both children in person and with patient's partner, [name], via phone - all are in agreement with planned procedures and wish to proceed.   Past Medical History:  Diagnosis Date   Anxiety    Depression    Rectal cancer ([place]) 12/17/2015    Past Surgical History:  Procedure Laterality Date   ABDOMINAL PERINEAL BOWEL RESECTION  06/07/2016   [facility]   COLON SURGERY     DIVERTING ILEOSTOMY  06/07/2016   [facility]   INGUINAL LYMPH NODE BIOPSY Left 09/18/2018   Procedure: LEFT INGUINAL LYMPH NODE BIOPSY;  Surgeon: Aviva Signs, MD;  Location: [facility] ORS;  Service: General;  Laterality: Left;   IR RADIOLOGIST EVAL & MGMT  12/25/2018   LAPAROSCOPIC LOW ANTERIOR RESCECTION WITH COLOANAL ANASTOMOSIS  06/07/2016   [facility]   PERIPHERALLY INSERTED CENTRAL CATHETER INSERTION  11/2015    Allergies: Patient has no known allergies.  Medications: Prior to Admission medications   Medication Sig Start Date End Date Taking? Authorizing Provider  ALPRAZolam Duanne Moron) 1 MG tablet Take  0.5 tablets (0.5 mg total) by mouth 2 (two) times daily as needed for anxiety. 06/04/19  Yes Derek Jack, MD  fentaNYL (DURAGESIC) 100 MCG/HR Place 3 patches onto the skin every 3 (three) days. 05/29/19  Yes Derek Jack, MD  FLUoxetine (PROZAC) 40 MG capsule TAKE 1 CAPSULE(40 MG) BY MOUTH DAILY 04/21/19  Yes Derek Jack, MD  Oxycodone HCl 20 MG TABS Take 1 tablet (20 mg total) by mouth every 3 (three) hours as needed (severe pain). 06/04/19  Yes Derek Jack, MD  prochlorperazine (COMPAZINE) 10 MG tablet Take 1 tablet (10 mg total) by mouth every 6 (six) hours as needed for nausea or vomiting. 05/21/19  Yes Derek Jack, MD  sulfamethoxazole-trimethoprim (BACTRIM DS) 800-160 MG tablet Take 1 tablet by mouth 2 (two) times daily. 05/21/19  Yes Derek Jack, MD  ondansetron (ZOFRAN ODT) 4 MG disintegrating tablet Take 1 tablet (4 mg total) by mouth every 8 (eight) hours as needed for nausea or vomiting. Patient not taking: Reported on 06/07/2019 03/03/19   Derek Jack, MD     Family History  Problem Relation Age of Onset   Rectal cancer Neg Hx     Social History   Socioeconomic History   Marital status: Divorced    Spouse name: Not on file   Number of children: Not on file   Years of education: Not on file   Highest education level: Not on file  Occupational History   Not on file  Social Needs   Financial resource strain: Not on file   Food insecurity    Worry: Not on file    Inability: Not on file   Transportation needs    Medical: Not on file    Non-medical: Not on file  Tobacco Use   Smoking status: Never Smoker   Smokeless tobacco: Never Used  Substance and Sexual Activity   Alcohol use: Yes    Comment: occ.    Drug use: No   Sexual activity: Not on file  Lifestyle   Physical activity    Days per week: Not on file    Minutes per session: Not on file   Stress: Not on file  Relationships   Social connections    Talks on phone: Not on file    Gets together: Not on file    Attends religious service: Not on file    Active member of club or organization: Not on file    Attends meetings of clubs or organizations: Not on file     Relationship status: Not on file  Other Topics Concern   Not on file  Social History Narrative   Not on file     Review of Systems: A 12 point ROS discussed and pertinent positives are indicated in the HPI above.  All other systems are negative.  Review of Systems  Unable to perform ROS: Intubated    Vital Signs: BP (!) 67/58    Pulse 85    Temp 99.1 F (37.3 C) (Oral)    Resp 12    Ht 6' (1.829 m)    Wt 161 lb 9.6 oz (73.3 kg)    SpO2 100%    BMI 21.92 kg/m   Physical Exam Vitals signs and nursing note reviewed.  Constitutional:      Appearance: He is ill-appearing.     Comments: Sedated, does not respond to touch or voice cues.   HENT:     Head: Normocephalic.  Cardiovascular:     Rate and  Rhythm: Normal rate and regular rhythm.  Pulmonary:     Comments: (+) intubated, venitlated. Diminished breath sounds bilaterally. Abdominal:     General: There is no distension.     Palpations: Abdomen is soft.     Comments: (+) right and left ostomies (+) right TG drain with dark red output  Musculoskeletal:     Right lower leg: No edema.     Left lower leg: Edema present.  Skin:    General: Skin is warm and dry.      MD Evaluation Airway: Other (comments)(Intubated) Heart: WNL Abdomen: WNL Chest/ Lungs: Other (comments)(ventilated) ASA  Classification: 3 Mallampati/Airway Score: (Intubated/ventilated)   Imaging: Ct Head Wo Contrast  Result Date: 06/08/2019 CLINICAL DATA:  Encephalopathy. EXAM: CT HEAD WITHOUT CONTRAST TECHNIQUE: Contiguous axial images were obtained from the base of the skull through the vertex without intravenous contrast. COMPARISON:  None. FINDINGS: Brain: Prominent CSF density structure in the right posterior fossa is likely an arachnoid cyst, less likely mega cisterna magna. No intracranial hemorrhage, mass effect, or midline shift. No hydrocephalus. The basilar cisterns are patent. No evidence of territorial infarct or acute ischemia. No  extra-axial or intracranial fluid collection. Vascular: No hyperdense vessel or unexpected calcification. Skull: No fracture or focal lesion. Sinuses/Orbits: Slight mucosal thickening of ethmoid air cells. No sinus fluid levels. Right mastoid air cells slightly hypoplastic. Visualized orbits are unremarkable. Other: None. IMPRESSION: No acute intracranial abnormality. 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 Electronically Signed   By: Keith Rake M.D.   On: 06/08/2019 00:48   Ct Angio Chest Pe W And/or Wo Contrast  Result Date: 06/07/2019 CLINICAL DATA:  53 year old male with acute shortness of breath and syncope. EXAM: CT ANGIOGRAPHY CHEST WITH CONTRAST TECHNIQUE: Multidetector CT imaging of the chest was performed using the standard protocol during bolus administration of intravenous contrast. Multiplanar CT image reconstructions and MIPs were obtained to evaluate the vascular anatomy. CONTRAST:  167m OMNIPAQUE IOHEXOL 350 MG/ML SOLN COMPARISON:  05/29/2018 and prior CTs FINDINGS: Cardiovascular: This is a technically satisfactory study. Segmental pulmonary emboli are identified within the RIGHT LOWER lobe (at least 2 segments) and LEFT UPPER lobe (at least 1 segment), best identified on thin sections. Mild cardiomegaly is noted. There is no evidence of thoracic aortic aneurysm or pericardial effusion. Mediastinum/Nodes: An endotracheal tube is identified with tip 2 cm above the carina. An NG 2 is noted entering the stomach. No mediastinal mass or enlarged lymph nodes. Thyroid gland is unremarkable. Lungs/Pleura: Mild to moderate bilateral dependent and bibasilar opacities/atelectasis noted. No evidence of pulmonary mass, suspicious nodule, airspace disease, pleural effusion or pneumothorax. Upper Abdomen: No acute abnormality. Musculoskeletal: No acute or suspicious bony abnormality. Review of the MIP images confirms the above findings. IMPRESSION: 1. Segmental pulmonary emboli within  the RIGHT LOWER lobe and LEFT UPPER lobe. 2. Mild to moderate bilateral dependent and bibasilar opacities/atelectasis. 3. Mild cardiomegaly. Critical Value/emergent results were called by telephone at the time of interpretation on 06/07/2019 at 3:24 pm to providerBRIAN MILLER , who verbally acknowledged these results. Electronically Signed   By: JMargarette CanadaM.D.   On: 06/07/2019 15:29   Ct Abdomen Pelvis W Contrast  Result Date: 06/08/2019 CLINICAL DATA:  Metastatic rectal cancer, sepsis. Syncope. In the media. EXAM: CT ABDOMEN AND PELVIS WITH CONTRAST TECHNIQUE: Multidetector CT imaging of the abdomen and pelvis was performed using the standard protocol following bolus administration of intravenous contrast. Performed in conjunction with CT of the right femur, reported separately.  CONTRAST:  75m OMNIPAQUE IOHEXOL 300 MG/ML  SOLN COMPARISON:  Most recent abdominal CT 03/31/2019. Chest CTA earlier this day. FINDINGS: Lower chest: Dependent basilar opacities have improved from chest CT yesterday. Residual streaky opacities in the left greater than right lower lobe. 5 mm left lower lobe pulmonary nodule series 8, image 14. Enteric tube in place. Hepatobiliary: Hepatic steatosis. No focal lesion. Distended gallbladder without calcified gallstone. No biliary dilatation. No pericholecystic inflammation. Pancreas: No ductal dilatation or inflammation. Spleen: Normal in size without focal abnormality. Adrenals/Urinary Tract: No adrenal nodule. Chronic left renal atrophy. There is excreted IV contrast within both renal collecting systems from prior contrast administration. Prominence of both ureters without frank hydronephrosis. No significant perinephric edema. Urostomy with ileal diversion, slight left ureteral prominence just proximal to the ureteric anastomosis. Cystectomy. Stomach/Bowel: Enteric tube within the stomach, no gastric wall thickening. No small bowel obstruction. Pelvic small bowel loops intermittently  related with chronic pelvic fluid collection. Appendix not clearly visualized. Moderate colonic stool burden. Transverse colonic redundancy. Descending colostomy without peristomal hernia. Prior APR. Vascular/Lymphatic: Filling defect within the left common femoral vein consistent DVT. The left common iliac vein is decompressed just proximal to the iliac confluence. Retroaortic left renal vein. Portal vein is patent. Abdominal aorta is normal in caliber. Rounded enhancing focus in the right presacral space measuring 14 mm is contiguous with the right internal iliac artery in suspicious for pseudoaneurysm, series 7, image 81. No enlarged abdominal lymph nodes. Few prominent bilateral inguinal nodes. Reproductive: Surgically absent prostate gland. Other: The previous left pelvic drainage catheter has been removed. The right transgluteal pelvic drainage catheter remains in place. Pelvic fluid collections now replaced with mottled air, that tracks into the perineum, left posterior upper thigh, and base of the penis. Difficult to delineate pelvic bowel loops from this mild pelvic fluid collection. No upper abdominal free fluid. Musculoskeletal: Heterogeneous appearance of the sacrum and coccyx with mixed density, this may represent radiation necrosis, however infiltrating lesion or osteomyelitis. Stable sclerotic lesion in the right iliac bone. Critical Value/emergent results were called by telephone at the time of interpretation on 06/08/2019 at 3:06 am to Dr summer, who verbally acknowledged these results. IMPRESSION: 1. Pelvic fluid collections now replaced with mottled air, air tracks into the perineum and base of the penis. Findings concerning for necrotizing soft tissue infection. Right-sided drainage catheter remains in place. 2. Filling defect in the left common femoral vein consistent with DVT. The left common iliac vein is decompressed just proximal to the iliac confluence. 3. Right internal iliac artery  pseudoaneurysm measuring 14 mm in the region of presacral fluid collection. Recommend vascular surgery or interventional radiology consultation given history of anemia. 4. Improved aeration at the lung bases compared to CT yesterday. 5 mm left lower lobe pulmonary nodule. Recommend attention on follow-up. 5. Heterogeneous appearance of the sacrum which may represent post radiation change, however metastatic lesion or osteomyelitis also considered. 6. Hepatic steatosis. Electronically Signed   By: MKeith RakeM.D.   On: 06/08/2019 03:06   Ct Femur Right Wo Contrast  Result Date: 06/08/2019 CLINICAL DATA:  Sepsis. 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 COMPARISON:  CT 12/25/2018 FINDINGS: Bones/Joint/Cartilage Heterogeneous appearance of the visualized distal sacrum and coccyx suggesting acute on chronic osteomyelitis. Right hip joint is intact. No fracture or dislocation. No destructive bone lesion of  the right femur. Joint spaces of the hip and knee are maintained. No knee joint effusion. Ligaments Suboptimally assessed by CT. Muscles and Tendons Normal right lower extremity muscle bulk without atrophy or fatty infiltration. Tendinous structures grossly intact. Soft tissues Along the course of right transgluteal drainage catheter in the right peroneal soft tissues is a fluid and air collection measuring approximately 3.8 x 2.1 cm (series 5, image 48) which appears contiguous with the larger intrapelvic collection. Please see dedicated CT of the abdomen and pelvis for further delineation of the intrapelvic findings IMPRESSION: 1. Heterogeneous appearance of the visualized distal sacrum and coccyx compatible with acute on chronic osteomyelitis. 2. Along the course of right transgluteal drainage catheter is a fluid and air  collection measuring approximately 3.8 x 2.1 cm which appears contiguous with the larger intrapelvic collection. 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 3. Please see dedicated CT of the abdomen and pelvis for further delineation of the intrapelvic findings. Electronically Signed   By: Davina Poke M.D.   On: 06/08/2019 09:13   Dg Chest Port 1 View  Result Date: 06/07/2019 CLINICAL DATA:  Shortness of breath. Rectal carcinoma. EXAM: PORTABLE CHEST 1 VIEW COMPARISON:  06/19/2016 FINDINGS: Right arm PICC line remains in appropriate position. Both lungs are clear. Heart size is normal. IMPRESSION: No active disease. Electronically Signed   By: Marlaine Hind M.D.   On: 06/07/2019 14:27    Labs:  CBC: Recent Labs    06/07/19 1420 06/07/19 1913 06/08/19 0306 06/08/19 0344 06/08/19 1012  WBC 84.8* 53.8* 42.3*  --  30.3*  HGB 2.7* 7.3* 7.9* 8.5* 5.6*  HCT 9.9* 20.8* 21.5* 25.0* 16.3*  PLT 729* 388 358  --  235    COAGS: Recent Labs    12/09/18 1045 06/07/19 2142  INR 1.1 1.4*    BMP: Recent Labs    05/12/19 1322 06/07/19 1420 06/07/19 1913 06/08/19 0306 06/08/19 0344  NA 133* 128* 133* 139 137  K 4.2 5.7* 4.1 3.7 3.5  CL 98 98 97* 103  --   CO2 20* <7* 20* 22  --   GLUCOSE 159* 315* 285* 124*  --   BUN 18 36* 29* 25*  --   CALCIUM 9.0 8.2* 6.9* 6.6*  --   CREATININE 1.24 2.27* 1.74* 1.56*  --   GFRNONAA >60 32* 44* 50*  --   GFRAA >60 37* 51* 58*  --     LIVER FUNCTION TESTS: Recent Labs    05/12/19 1322 06/07/19 1420 06/07/19 1913 06/08/19 0306  BILITOT 0.3 0.1* 1.6* 1.3*  AST 28 31 32 30  ALT 38 <5 14 16   ALKPHOS 234* 139* 130* 123  PROT 7.3 4.5* 4.1* 4.0*  ALBUMIN 2.5* 1.4* 1.3* 1.2*    TUMOR MARKERS: No results for input(s): AFPTM, CEA, CA199, CHROMGRNA in the last 8760 hours.  Assessment and Plan:  53 y/o M with history of rectal cancer s/p numerous interventions at several institutions including presacral abscess drain  placement 12/09/18 in IR by Dr. Anselm Pancoast (still in place on exam today) who presented to [facility] ED yesterday with acute respiratory distress requiring intubation. 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 IR has been asked to evaluate the pelvic fluid collections, pseudoaneurysm and to place an IVC filter as patient is currently  unable to be anticoagulated. Patient history and imaging have been reviewed by attending IR MD today (Dr. Anselm Pancoast) who plans for image guided angiogram with possible embolization of pseudoaneurysm as well as IVC filter placement. Above plans were discussed with patient's daughter Jonathan Wright, son Jonathan Wright and partner Tarri Fuller (via phone) who are all in agreement to proceed.  The Risks and benefits of embolization were discussed with the patient's daughter Jonathan Wright and son Jonathan Wright in person as well as patient's partner [name] via phone including, but not limited to bleeding, infection, vascular injury, post operative pain, or contrast induced renal failure.  This procedure involves the use of X-rays and because of the nature of the planned procedure, it is possible that we will have prolonged use of X-ray fluoroscopy.  Potential radiation risks to you include (but are not limited to) the following: - A slightly elevated risk for cancer several years later in life. This risk is typically less than 0.5% percent. This risk is low in comparison to the normal incidence of human cancer, which is 33% for women and 50% for men according to the Will. - Radiation induced injury can include skin redness, resembling a rash, tissue breakdown / ulcers and hair loss (which can be temporary or permanent).  The likelihood of either of these occurring depends on the difficulty of the procedure and whether you are sensitive  to radiation due to previous procedures, disease, or genetic conditions.  IF your procedure requires a prolonged use of radiation, you will be notified and given written instructions for further action. It is your responsibility to monitor the irradiated area for the 2 weeks following the procedure and to notify your physician if you are concerned that you have suffered a radiation induced injury.   Risks and benefits discussed with the patient's children and partner as above including, but not limited to bleeding, infection, contrast induced renal failure, filter fracture or migration which can lead to emergency surgery or even death, strut penetration with damage or irritation to adjacent structures and caval thrombosis.  All of the patient's family's questions were answered, patient's family are agreeable to proceed.  Consent signed and in IR control room  Thank you for this interesting consult.  I greatly enjoyed meeting KOAH CHISENHALL and look forward to participating in their care.  A copy of this report was sent to the requesting provider on this date.  Electronically Signed: Joaquim Nam, PA-C 06/08/2019, 11:05 AM   I spent a total of 40 Minutes  in face to face in clinical consultation, greater than 50% of which was counseling/coordinating care for pseudoaneurysm embolization and IVC filter placement.

## 2019-06-08 NOTE — Progress Notes (Signed)
Pharmacy Antibiotic Note  Jonathan Wright is a 52 y.o. male with metastatic rectal CA and recurrent pelvic abscesses admitted on 06/07/2019 with GIB and septic shock. Pharmacy has been consulted to transition antibiotics to Meropenem and Zyvox.   Plan: - Start Zyvox 600 mg IV every 12 hours - Will monitor for serotonin syndrome given Prozac use PTA - Start Meropenem 2g IV every 8 hours - Flagyl IV and Vanc po added for CDiff - Will continue to follow renal function, culture results, LOT, and antibiotic de-escalation plans    Height: 6' (182.9 cm) Weight: 161 lb 9.6 oz (73.3 kg) IBW/kg (Calculated) : 77.6  Temp (24hrs), Avg:98 F (36.7 C), Min:94.4 F (34.7 C), Max:99.2 F (37.3 C)  Recent Labs  Lab 06/07/19 1420 06/07/19 1913 06/07/19 2142 06/08/19 0306 06/08/19 0748 06/08/19 1012  WBC 84.8* 53.8*  --  42.3*  --  30.3*  CREATININE 2.27* 1.74*  --  1.56*  --   --   LATICACIDVEN  --  5.3* 5.0*  --  1.5  --     Estimated Creatinine Clearance: 57.4 mL/min (A) (by C-G formula based on SCr of 1.56 mg/dL (H)).    No Known Allergies  Antimicrobials this admission: Vancomycin 10/17 >> 10/18 Zosyn 10/17 >> 10/18 Zyvox 10/18 >> Meropenem 10/18 >>  Microbiology results: 10/17 COVID >> neg 10/17 MRSA PCR >> neg 10/17 JP drain >> abundant GNR, few GPC/GPR >> abundant K PNA 10/17 CDiff >> antigen +, toxin - >> PCR positive 10/17 BCx >>  Thank you for allowing pharmacy to be a part of this patient's care.  Alycia Rossetti, PharmD, BCPS Clinical Pharmacist Clinical phone for 06/08/2019: [phone] 06/08/2019 1:55 PM   **Pharmacist phone directory can now be found on [email or web address] ([email or web address]).  Listed under [place].

## 2019-06-08 NOTE — Progress Notes (Signed)
Pt transported from 3M06 to IR and back without incident. 

## 2019-06-08 NOTE — Progress Notes (Addendum)
[place] Progress Note Patient Name: Jonathan Wright DOB: [birth year] MRN: [id number]   Date of Service  06/08/2019  HPI/Events of Note  ABG on 50%/PRVC 24/TV 620/P 5 = 7.713/21.2/217.0.  eICU Interventions  Will order: 1. Decrease PRVC rate to 14. 2. D/C NaHCO3 IV infusion. 3. Repeat ABG at 8 AM.      Intervention Category Major Interventions: Acid-Base disturbance - evaluation and management;Respiratory failure - evaluation and management  Sommer,Steven Eugene 06/08/2019, 4:13 AM

## 2019-06-08 NOTE — Progress Notes (Signed)
[place] Progress Note Patient Name: Jonathan Wright DOB: 19-Jul-1966 MRN: [id number]   Date of Service  06/08/2019  HPI/Events of Note  Last ABG = 7.713/21.2/217.0/27.0. Ventilator rate decreased and NaHCO3 IV infusion D/Ced. Ordered f/u ABG never obtained.   eICU Interventions  Will order: 1. ABG now.      Intervention Category Major Interventions: Acid-Base disturbance - evaluation and management;Respiratory failure - evaluation and management  [name] 06/08/2019, 10:38 PM

## 2019-06-08 NOTE — Procedures (Signed)
Interventional Radiology Procedure:   Indications: Right pelvic pseudoaneurysm with decreasing hemoglobin.  PE and LLE DVT.  Procedure: 1) Pelvic arteriogram with embolization of right internal iliac artery branch supplying the pseudoaneurysm and gelfoam embolization of an adjacent branch with retrograde filling of the pseuduoaneurysm.  2) IVC filter placement  Findings: Pseudoaneurysm from right internal iliac branch, treated with proximal coils and adjacent vessel treated with Gelfoam embolization.  No filling of pseudoaneurysm after embolization.  Left retroaortic renal vein, patent IVC.  Denali filter placed below left renal vein.  Pelvic drain is difficult to flush and unable to aspirate  Complications: None    EBL: less than 30 ml  Plan: Return to ICU.  Will need to re-assess pelvic drain when patient is extubated.  Likely need drain upsizing.     Jonathan Wright R. Anselm Pancoast, MD  Pager: [phone]

## 2019-06-08 NOTE — Consult Note (Addendum)
Surgical Consultation Requesting provider: Dr. Ralene Ok  CC: septic shock  HPI: History taken from chart review. 53yo man with extensive and complex history surrounding rectal cancer who was transferred to [facility] from [facility] where he presented yesterday with respiratory distress, severe anemia (hemoglobin 2.7) and leukocytosis (84.8).  Initial ABG 6.88, CO2 24, O2 63, base deficit 25.8, bicarbonate undetectable.Marland Kitchen He was diagnosed with Stage IIIB rectal adenocarcinoma in 2017. Underwent neoadjuvant radiation and chemo (folfox). Robotic LAR with coloanal anastomosis and loop ileostomy in Oct 2017 at [facility], complicated by anastomotic dehiscence and pelvic abscess, which continued to evolve and fistulize, and he was found to have local recurrence at the anastomosis in Oct 2018. Underwent another round of chemo (xeloda) and RADiation. Pelvic exenteration at [place] [facility] in Oct 2019. Also had a positive inguinal LN biopsied by Dr Arnoldo Morale in January this yr. He has continued to suffer with recurrent pelvic abscesses. This has been managed at [facility] with several drains placed here by IR. He currently has one transgluteal drain in place.  CT performed here last night shows recurrent air/fluid in the pelvis with air tracking into the perineum, concern for necrotizing soft tissue infection, Left common femoral vein DVT, right internal iliac artery pseudoaneurysm measuring 69mm within the region of the presacral fluid collection.  Surgery is consulted regarding the pelvic abscess and concern of NSTI.   No Known Allergies  Past Medical History:  Diagnosis Date  . Anxiety   . Depression   . Rectal cancer ([place]) 12/17/2015    Past Surgical History:  Procedure Laterality Date  . ABDOMINAL PERINEAL BOWEL RESECTION  06/07/2016   [facility]  . COLON SURGERY    . DIVERTING ILEOSTOMY  06/07/2016   [facility]  . INGUINAL LYMPH NODE BIOPSY Left 09/18/2018   Procedure: LEFT INGUINAL LYMPH NODE  BIOPSY;  Surgeon: Aviva Signs, MD;  Location: [facility];  Service: General;  Laterality: Left;  . IR RADIOLOGIST EVAL & MGMT  12/25/2018  . LAPAROSCOPIC LOW ANTERIOR RESCECTION WITH COLOANAL ANASTOMOSIS  06/07/2016   [facility]  . PERIPHERALLY INSERTED CENTRAL CATHETER INSERTION  11/2015    Family History  Problem Relation Age of Onset  . Rectal cancer Neg Hx     Social History   Socioeconomic History  . Marital status: Divorced    Spouse name: Not on file  . Number of children: Not on file  . Years of education: Not on file  . Highest education level: Not on file  Occupational History  . Not on file  Social Needs  . Financial resource strain: Not on file  . Food insecurity    Worry: Not on file    Inability: Not on file  . Transportation needs    Medical: Not on file    Non-medical: Not on file  Tobacco Use  . Smoking status: Never Smoker  . Smokeless tobacco: Never Used  Substance and Sexual Activity  . Alcohol use: Yes    Comment: occ.   . Drug use: No  . Sexual activity: Not on file  Lifestyle  . Physical activity    Days per week: Not on file    Minutes per session: Not on file  . Stress: Not on file  Relationships  . Social Herbalist on phone: Not on file    Gets together: Not on file    Attends religious service: Not on file    Active member of club or organization: Not on  file    Attends meetings of clubs or organizations: Not on file    Relationship status: Not on file  Other Topics Concern  . Not on file  Social History Narrative  . Not on file    No current facility-administered medications on file prior to encounter.    Current Outpatient Medications on File Prior to Encounter  Medication Sig Dispense Refill  . ALPRAZolam (XANAX) 1 MG tablet Take 0.5 tablets (0.5 mg total) by mouth 2 (two) times daily as needed for anxiety. 30 tablet 0  . fentaNYL (DURAGESIC) 100 MCG/HR Place 3 patches onto the skin every 3 (three) days. 15 patch 0   . FLUoxetine (PROZAC) 40 MG capsule TAKE 1 CAPSULE(40 MG) BY MOUTH DAILY 30 capsule 3  . Oxycodone HCl 20 MG TABS Take 1 tablet (20 mg total) by mouth every 3 (three) hours as needed (severe pain). 120 tablet 0  . prochlorperazine (COMPAZINE) 10 MG tablet Take 1 tablet (10 mg total) by mouth every 6 (six) hours as needed for nausea or vomiting. 30 tablet 6  . sulfamethoxazole-trimethoprim (BACTRIM DS) 800-160 MG tablet Take 1 tablet by mouth 2 (two) times daily. 60 tablet 0  . ondansetron (ZOFRAN ODT) 4 MG disintegrating tablet Take 1 tablet (4 mg total) by mouth every 8 (eight) hours as needed for nausea or vomiting. (Patient not taking: Reported on 06/07/2019) 60 tablet 2    Review of Systems: a complete, 10pt review of systems was Unable to be completed as patient is intubated and sedated  Physical Exam: Vitals:   06/08/19 0200 06/08/19 0300  BP: (!) 96/57 104/67  Pulse: 100 96  Resp: (!) 21 (!) 24  Temp:    SpO2: 100% 100%   Gen: Intubated, sedated, chronically ill-appearing Head: normocephalic, atraumatic Eyes: Pupils equal and reactive anicteric.  Neck: Trachea midline, no mass Chest: Appears comfortable on the vent Cardiovascular: RRR, levo at 7 Abdomen: soft, nondistended, nontender.  Well-healed surgical scars.  Right lower quadrant urostomy productive of clear urine and left lower quadrant colostomy. Viable and largely well-healed flap in the perineum with some chronic anterior dehiscence draining clotted blood type material.  There is mild chronic appearing induration in the left gluteus but no fluctuance, crepitus, or overlying skin changes suggestive necrotizing soft tissue infection.  Transgluteal drain is somewhat mobile along its tract, draining malodorous, old blood tinged fluid. Extremities: warm, without edema, no deformities  Psych: Unable to assess Skin: warm and dry   CBC Latest Ref Rng & Units 06/08/2019 06/08/2019 06/07/2019  WBC 4.0 - 10.5 K/uL - 42.3(H)  53.8(HH)  Hemoglobin 13.0 - 17.0 g/dL 8.5(L) 7.9(L) 7.3(L)  Hematocrit 39.0 - 52.0 % 25.0(L) 21.5(L) 20.8(L)  Platelets 150 - 400 K/uL - 358 388    CMP Latest Ref Rng & Units 06/08/2019 06/07/2019 06/07/2019  Glucose 70 - 99 mg/dL - 285(H) 315(H)  BUN 6 - 20 mg/dL - 29(H) 36(H)  Creatinine 0.61 - 1.24 mg/dL - 1.74(H) 2.27(H)  Sodium 135 - 145 mmol/L 137 133(L) 128(L)  Potassium 3.5 - 5.1 mmol/L 3.5 4.1 5.7(H)  Chloride 98 - 111 mmol/L - 97(L) 98  CO2 22 - 32 mmol/L - 20(L) <7(L)  Calcium 8.9 - 10.3 mg/dL - 6.9(L) 8.2(L)  Total Protein 6.5 - 8.1 g/dL - 4.1(L) 4.5(L)  Total Bilirubin 0.3 - 1.2 mg/dL - 1.6(H) 0.1(L)  Alkaline Phos 38 - 126 U/L - 130(H) 139(H)  AST 15 - 41 U/L - 32 31  ALT 0 - 44 U/L -  14 <5    Lab Results  Component Value Date   INR 1.4 (H) 06/07/2019   INR 1.1 12/09/2018   INR 0.95 05/15/2017    Imaging: Ct Head Wo Contrast  Result Date: 06/08/2019 CLINICAL DATA:  Encephalopathy. EXAM: CT HEAD WITHOUT CONTRAST TECHNIQUE: Contiguous axial images were obtained from the base of the skull through the vertex without intravenous contrast. COMPARISON:  None. FINDINGS: Brain: Prominent CSF density structure in the right posterior fossa is likely an arachnoid cyst, less likely mega cisterna magna. No intracranial hemorrhage, mass effect, or midline shift. No hydrocephalus. The basilar cisterns are patent. No evidence of territorial infarct or acute ischemia. No extra-axial or intracranial fluid collection. Vascular: No hyperdense vessel or unexpected calcification. Skull: No fracture or focal lesion. Sinuses/Orbits: Slight mucosal thickening of ethmoid air cells. No sinus fluid levels. Right mastoid air cells slightly hypoplastic. Visualized orbits are unremarkable. Other: None. IMPRESSION: No acute intracranial abnormality. 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 Electronically Signed   By: Keith Rake M.D.   On: 06/08/2019 00:48   Ct Angio Chest Pe W  And/or Wo Contrast  Result Date: 06/07/2019 CLINICAL DATA:  53 year old male with acute shortness of breath and syncope. EXAM: CT ANGIOGRAPHY CHEST WITH CONTRAST TECHNIQUE: Multidetector CT imaging of the chest was performed using the standard protocol during bolus administration of intravenous contrast. Multiplanar CT image reconstructions and MIPs were obtained to evaluate the vascular anatomy. CONTRAST:  143mL OMNIPAQUE IOHEXOL 350 MG/ML SOLN COMPARISON:  05/29/2018 and prior CTs FINDINGS: Cardiovascular: This is a technically satisfactory study. Segmental pulmonary emboli are identified within the RIGHT LOWER lobe (at least 2 segments) and LEFT UPPER lobe (at least 1 segment), best identified on thin sections. Mild cardiomegaly is noted. There is no evidence of thoracic aortic aneurysm or pericardial effusion. Mediastinum/Nodes: An endotracheal tube is identified with tip 2 cm above the carina. An NG 2 is noted entering the stomach. No mediastinal mass or enlarged lymph nodes. Thyroid gland is unremarkable. Lungs/Pleura: Mild to moderate bilateral dependent and bibasilar opacities/atelectasis noted. No evidence of pulmonary mass, suspicious nodule, airspace disease, pleural effusion or pneumothorax. Upper Abdomen: No acute abnormality. Musculoskeletal: No acute or suspicious bony abnormality. Review of the MIP images confirms the above findings. IMPRESSION: 1. Segmental pulmonary emboli within the RIGHT LOWER lobe and LEFT UPPER lobe. 2. Mild to moderate bilateral dependent and bibasilar opacities/atelectasis. 3. Mild cardiomegaly. Critical Value/emergent results were called by telephone at the time of interpretation on 06/07/2019 at 3:24 pm to providerBRIAN MILLER , who verbally acknowledged these results. Electronically Signed   By: Margarette Canada M.D.   On: 06/07/2019 15:29   Ct Abdomen Pelvis W Contrast  Result Date: 06/08/2019 CLINICAL DATA:  Metastatic rectal cancer, sepsis. Syncope. In the media.  EXAM: CT ABDOMEN AND PELVIS WITH CONTRAST TECHNIQUE: Multidetector CT imaging of the abdomen and pelvis was performed using the standard protocol following bolus administration of intravenous contrast. Performed in conjunction with CT of the right femur, reported separately. CONTRAST:  47mL OMNIPAQUE IOHEXOL 300 MG/ML  SOLN COMPARISON:  Most recent abdominal CT 03/31/2019. Chest CTA earlier this day. FINDINGS: Lower chest: Dependent basilar opacities have improved from chest CT yesterday. Residual streaky opacities in the left greater than right lower lobe. 5 mm left lower lobe pulmonary nodule series 8, image 14. Enteric tube in place. Hepatobiliary: Hepatic steatosis. No focal lesion. Distended gallbladder without calcified gallstone. No biliary dilatation. No pericholecystic inflammation. Pancreas: No ductal dilatation or inflammation. Spleen: Normal  in size without focal abnormality. Adrenals/Urinary Tract: No adrenal nodule. Chronic left renal atrophy. There is excreted IV contrast within both renal collecting systems from prior contrast administration. Prominence of both ureters without frank hydronephrosis. No significant perinephric edema. Urostomy with ileal diversion, slight left ureteral prominence just proximal to the ureteric anastomosis. Cystectomy. Stomach/Bowel: Enteric tube within the stomach, no gastric wall thickening. No small bowel obstruction. Pelvic small bowel loops intermittently related with chronic pelvic fluid collection. Appendix not clearly visualized. Moderate colonic stool burden. Transverse colonic redundancy. Descending colostomy without peristomal hernia. Prior APR. Vascular/Lymphatic: Filling defect within the left common femoral vein consistent DVT. The left common iliac vein is decompressed just proximal to the iliac confluence. Retroaortic left renal vein. Portal vein is patent. Abdominal aorta is normal in caliber. Rounded enhancing focus in the right presacral space measuring  14 mm is contiguous with the right internal iliac artery in suspicious for pseudoaneurysm, series 7, image 81. No enlarged abdominal lymph nodes. Few prominent bilateral inguinal nodes. Reproductive: Surgically absent prostate gland. Other: The previous left pelvic drainage catheter has been removed. The right transgluteal pelvic drainage catheter remains in place. Pelvic fluid collections now replaced with mottled air, that tracks into the perineum, left posterior upper thigh, and base of the penis. Difficult to delineate pelvic bowel loops from this mild pelvic fluid collection. No upper abdominal free fluid. Musculoskeletal: Heterogeneous appearance of the sacrum and coccyx with mixed density, this may represent radiation necrosis, however infiltrating lesion or osteomyelitis. Stable sclerotic lesion in the right iliac bone. Critical Value/emergent results were called by telephone at the time of interpretation on 06/08/2019 at 3:06 am to Dr summer, who verbally acknowledged these results. IMPRESSION: 1. Pelvic fluid collections now replaced with mottled air, air tracks into the perineum and base of the penis. Findings concerning for necrotizing soft tissue infection. Right-sided drainage catheter remains in place. 2. Filling defect in the left common femoral vein consistent with DVT. The left common iliac vein is decompressed just proximal to the iliac confluence. 3. 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 Recommend vascular surgery or interventional radiology consultation given history of anemia. 4. Improved aeration at the lung bases compared to CT yesterday. 5 mm left lower lobe pulmonary nodule. Recommend attention on follow-up. 5. Heterogeneous appearance of the sacrum which may represent post radiation change, however metastatic lesion or osteomyelitis also considered. 6. Hepatic steatosis. Electronically Signed   By: Keith Rake M.D.    On: 06/08/2019 03:06   Dg Chest Port 1 View  Result Date: 06/07/2019 CLINICAL DATA:  Shortness of breath. Rectal carcinoma. EXAM: PORTABLE CHEST 1 VIEW COMPARISON:  06/19/2016 FINDINGS: Right arm PICC line remains in appropriate position. Both lungs are clear. Heart size is normal. IMPRESSION: No active disease. Electronically Signed   By: Marlaine Hind M.D.   On: 06/07/2019 14:27     A/P: 53 year old gentleman with recurrent metastatic rectal cancer status post pelvic exenteration and longstanding chronic pelvic abscess with fistulization in the setting of 2 prior rounds of pelvic radiation.  I suspect the appearance on CT represents reaccumulation of abscess material and fistulization to the adjacent peritoneum.  His best chance for recovering would likely be to upsize his existing drain and likely add an additional drain to evacuate that material from his pelvis as best as possible.  Attempt at surgical debridement will only result in a larger nonhealing wound communicating with the peritoneal cavity, and if this becomes  necessary I would strongly recommend transfer to a tertiary facility where advanced reconstruction options are available, although there may not be much more to offer him from a surgical standpoint.  Would also consider IR eval of the internal iliac pseudoaneurysm for embolization.   Romana Juniper, MD [facility], [place] Pager [phone]

## 2019-06-08 NOTE — Progress Notes (Signed)
[place] Progress Note Patient Name: Jonathan Wright DOB: [birth year] MRN: [id number]   Date of Service  06/08/2019  HPI/Events of Note  Nausea - Request for anitemetic. QTc interval = 0.53.   eICU Interventions  Will order: 1. Compazine 10 mg IV Q 6 hours PRN N/V.     Intervention Category Major Interventions: Other:  [name] 06/08/2019, 11:52 PM

## 2019-06-08 NOTE — Progress Notes (Signed)
Subjective/Chief Complaint: Pt with con't critical care support On vent   Objective: Vital signs in last 24 hours: Temp:  [94.4 F (34.7 C)-99.2 F (37.3 C)] 99.2 F (37.3 C) (10/18 0418) Pulse Rate:  [93-130] 94 (10/18 0807) Resp:  [11-27] 11 (10/18 0807) BP: (64-127)/(28-85) 94/62 (10/18 0807) SpO2:  [92 %-100 %] 100 % (10/18 0807) FiO2 (%):  [30 %-100 %] 30 % (10/18 0807) Weight:  [73.3 kg-74 kg] 73.3 kg (10/18 0427) Last BM Date: 06/08/19  Intake/Output from previous day: 10/17 0701 - 10/18 0700 In: 7416.7 [I.V.:3839.9; Blood:1388.3; IV Piggyback:2188.5] Out: 1100 [Urine:1000; Stool:100] Intake/Output this shift: No intake/output data recorded.  PE Gen: Intubated, sedated, chronically ill-appearing Head: normocephalic, atraumatic Eyes: Pupils equal and reactive anicteric.  Neck: Trachea midline, no mass Chest: Appears comfortable on the vent Cardiovascular: RRR, levo at 4 Abdomen: soft, nondistended, nontender.  Well-healed surgical scars.  Right lower quadrant urostomy productive of clear urine and left lower quadrant colostomy. Viable and largely well-healed flap in the perineum with some chronic anterior dehiscence draining clotted blood type material.  There is mild chronic appearing induration in the left gluteus but no fluctuance, crepitus, or overlying skin changes suggestive necrotizing soft tissue infection.  Transgluteal drain is somewhat mobile along its tract, draining malodorous, old blood tinged fluid. Extremities: warm, without edema, no deformities  Psych: Unable to assess Skin: warm and dry  Lab Results:  Recent Labs    06/07/19 1913 06/08/19 0306 06/08/19 0344  WBC 53.8* 42.3*  --   HGB 7.3* 7.9* 8.5*  HCT 20.8* 21.5* 25.0*  PLT 388 358  --    BMET Recent Labs    06/07/19 1913 06/08/19 0306 06/08/19 0344  NA 133* 139 137  K 4.1 3.7 3.5  CL 97* 103  --   CO2 20* 22  --   GLUCOSE 285* 124*  --   BUN 29* 25*  --   CREATININE 1.74*  1.56*  --   CALCIUM 6.9* 6.6*  --    PT/INR Recent Labs    06/07/19 2142  LABPROT 16.8*  INR 1.4*   ABG Recent Labs    06/07/19 1512 06/08/19 0344  PHART 6.888* 7.713*  HCO3 NOT CALCULATED 27.0    Studies/Results: Ct Head Wo Contrast  Result Date: 06/08/2019 CLINICAL DATA:  Encephalopathy. EXAM: CT HEAD WITHOUT CONTRAST TECHNIQUE: Contiguous axial images were obtained from the base of the skull through the vertex without intravenous contrast. COMPARISON:  None. FINDINGS: Brain: Prominent CSF density structure in the right posterior fossa is likely an arachnoid cyst, less likely mega cisterna magna. No intracranial hemorrhage, mass effect, or midline shift. No hydrocephalus. The basilar cisterns are patent. No evidence of territorial infarct or acute ischemia. No extra-axial or intracranial fluid collection. Vascular: No hyperdense vessel or unexpected calcification. Skull: No fracture or focal lesion. Sinuses/Orbits: Slight mucosal thickening of ethmoid air cells. No sinus fluid levels. Right mastoid air cells slightly hypoplastic. Visualized orbits are unremarkable. Other: None. IMPRESSION: No acute intracranial abnormality. 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 Electronically Signed   By: Keith Rake M.D.   On: 06/08/2019 00:48   Ct Angio Chest Pe W And/or Wo Contrast  Result Date: 06/07/2019 CLINICAL DATA:  53 year old male with acute shortness of breath and syncope. EXAM: CT ANGIOGRAPHY CHEST WITH CONTRAST TECHNIQUE: Multidetector CT imaging of the chest was performed using the standard protocol during bolus administration of intravenous contrast. Multiplanar CT image reconstructions and MIPs were obtained to evaluate the  vascular anatomy. CONTRAST:  195mL OMNIPAQUE IOHEXOL 350 MG/ML SOLN COMPARISON:  05/29/2018 and prior CTs FINDINGS: Cardiovascular: This is a technically satisfactory study. Segmental pulmonary emboli are identified within the RIGHT LOWER lobe  (at least 2 segments) and LEFT UPPER lobe (at least 1 segment), best identified on thin sections. Mild cardiomegaly is noted. There is no evidence of thoracic aortic aneurysm or pericardial effusion. Mediastinum/Nodes: An endotracheal tube is identified with tip 2 cm above the carina. An NG 2 is noted entering the stomach. No mediastinal mass or enlarged lymph nodes. Thyroid gland is unremarkable. Lungs/Pleura: Mild to moderate bilateral dependent and bibasilar opacities/atelectasis noted. No evidence of pulmonary mass, suspicious nodule, airspace disease, pleural effusion or pneumothorax. Upper Abdomen: No acute abnormality. Musculoskeletal: No acute or suspicious bony abnormality. Review of the MIP images confirms the above findings. IMPRESSION: 1. Segmental pulmonary emboli within the RIGHT LOWER lobe and LEFT UPPER lobe. 2. Mild to moderate bilateral dependent and bibasilar opacities/atelectasis. 3. Mild cardiomegaly. Critical Value/emergent results were called by telephone at the time of interpretation on 06/07/2019 at 3:24 pm to providerBRIAN MILLER , who verbally acknowledged these results. Electronically Signed   By: Margarette Canada M.D.   On: 06/07/2019 15:29   Ct Abdomen Pelvis W Contrast  Result Date: 06/08/2019 CLINICAL DATA:  Metastatic rectal cancer, sepsis. Syncope. In the media. EXAM: CT ABDOMEN AND PELVIS WITH CONTRAST TECHNIQUE: Multidetector CT imaging of the abdomen and pelvis was performed using the standard protocol following bolus administration of intravenous contrast. Performed in conjunction with CT of the right femur, reported separately. CONTRAST:  34mL OMNIPAQUE IOHEXOL 300 MG/ML  SOLN COMPARISON:  Most recent abdominal CT 03/31/2019. Chest CTA earlier this day. FINDINGS: Lower chest: Dependent basilar opacities have improved from chest CT yesterday. Residual streaky opacities in the left greater than right lower lobe. 5 mm left lower lobe pulmonary nodule series 8, image 14. Enteric  tube in place. Hepatobiliary: Hepatic steatosis. No focal lesion. Distended gallbladder without calcified gallstone. No biliary dilatation. No pericholecystic inflammation. Pancreas: No ductal dilatation or inflammation. Spleen: Normal in size without focal abnormality. Adrenals/Urinary Tract: No adrenal nodule. Chronic left renal atrophy. There is excreted IV contrast within both renal collecting systems from prior contrast administration. Prominence of both ureters without frank hydronephrosis. No significant perinephric edema. Urostomy with ileal diversion, slight left ureteral prominence just proximal to the ureteric anastomosis. Cystectomy. Stomach/Bowel: Enteric tube within the stomach, no gastric wall thickening. No small bowel obstruction. Pelvic small bowel loops intermittently related with chronic pelvic fluid collection. Appendix not clearly visualized. Moderate colonic stool burden. Transverse colonic redundancy. Descending colostomy without peristomal hernia. Prior APR. Vascular/Lymphatic: Filling defect within the left common femoral vein consistent DVT. The left common iliac vein is decompressed just proximal to the iliac confluence. Retroaortic left renal vein. Portal vein is patent. Abdominal aorta is normal in caliber. Rounded enhancing focus in the right presacral space measuring 14 mm is contiguous with the right internal iliac artery in suspicious for pseudoaneurysm, series 7, image 81. No enlarged abdominal lymph nodes. Few prominent bilateral inguinal nodes. Reproductive: Surgically absent prostate gland. Other: The previous left pelvic drainage catheter has been removed. The right transgluteal pelvic drainage catheter remains in place. Pelvic fluid collections now replaced with mottled air, that tracks into the perineum, left posterior upper thigh, and base of the penis. Difficult to delineate pelvic bowel loops from this mild pelvic fluid collection. No upper abdominal free fluid.  Musculoskeletal: Heterogeneous appearance of the sacrum and coccyx with  mixed density, this may represent radiation necrosis, however infiltrating lesion or osteomyelitis. Stable sclerotic lesion in the right iliac bone. Critical Value/emergent results were called by telephone at the time of interpretation on 06/08/2019 at 3:06 am to Dr summer, who verbally acknowledged these results. IMPRESSION: 1. Pelvic fluid collections now replaced with mottled air, air tracks into the perineum and base of the penis. Findings concerning for necrotizing soft tissue infection. Right-sided drainage catheter remains in place. 2. Filling defect in the left common femoral vein consistent with DVT. The left common iliac vein is decompressed just proximal to the iliac confluence. 3. 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 Recommend vascular surgery or interventional radiology consultation given history of anemia. 4. Improved aeration at the lung bases compared to CT yesterday. 5 mm left lower lobe pulmonary nodule. Recommend attention on follow-up. 5. Heterogeneous appearance of the sacrum which may represent post radiation change, however metastatic lesion or osteomyelitis also considered. 6. Hepatic steatosis. Electronically Signed   By: Keith Rake M.D.   On: 06/08/2019 03:06   Dg Chest Port 1 View  Result Date: 06/07/2019 CLINICAL DATA:  Shortness of breath. Rectal carcinoma. EXAM: PORTABLE CHEST 1 VIEW COMPARISON:  06/19/2016 FINDINGS: Right arm PICC line remains in appropriate position. Both lungs are clear. Heart size is normal. IMPRESSION: No active disease. Electronically Signed   By: Marlaine Hind M.D.   On: 06/07/2019 14:27    Anti-infectives: Anti-infectives (From admission, onward)   Start     Dose/Rate Route Frequency Ordered Stop   06/08/19 2200  vancomycin (VANCOCIN) 1,250 mg in sodium chloride 0.9 % 250 mL IVPB     1,250 mg 166.7 mL/hr  over 90 Minutes Intravenous Every 24 hours 06/08/19 0729     06/08/19 2100  vancomycin (VANCOCIN) 1,250 mg in sodium chloride 0.9 % 250 mL IVPB  Status:  Discontinued     1,250 mg 166.7 mL/hr over 90 Minutes Intravenous  Once 06/07/19 2103 06/08/19 0729   06/07/19 2130  piperacillin-tazobactam (ZOSYN) IVPB 3.375 g     3.375 g 12.5 mL/hr over 240 Minutes Intravenous Every 8 hours 06/07/19 2103     06/07/19 2115  vancomycin (VANCOCIN) 500 mg in sodium chloride 0.9 % 100 mL IVPB     500 mg 100 mL/hr over 60 Minutes Intravenous  Once 06/07/19 2103 06/07/19 2345   06/07/19 1515  vancomycin (VANCOCIN) IVPB 1000 mg/200 mL premix     1,000 mg 200 mL/hr over 60 Minutes Intravenous  Once 06/07/19 1512 06/07/19 1819   06/07/19 1515  piperacillin-tazobactam (ZOSYN) IVPB 3.375 g     3.375 g 100 mL/hr over 30 Minutes Intravenous  Once 06/07/19 1512 06/07/19 1618      Assessment/Plan: A/P: 53 year old gentleman with recurrent metastatic rectal cancer status post pelvic exenteration and longstanding chronic pelvic abscess with fistulization in the setting of 2 prior rounds of pelvic radiation. -Pt likely needs IR to upsize drain to pelvic collection. -Pt would likely benefit from Trx to [facility] to definitive care as pt has had most of this addressed there recently. -No plans for surgical care as this would likely cause further wound management issues/breakdown -Would also consider IR eval of the internal iliac pseudoaneurysm for embolization.   LOS: 1 day    Ralene Ok 06/08/2019

## 2019-06-08 NOTE — Progress Notes (Signed)
NAME:  Jonathan Wright, MRN:  [id number], DOB:  01/27/1966, LOS: 1 ADMISSION DATE:  06/07/2019, CONSULTATION DATE:  06/07/2019 REFERRING MD:  Dr. Sabra Heck, CHIEF COMPLAINT:  Hypotension  Brief History   53 year old male with hx of metastatic rectal cancer presenting with respiratory distress and syncopal episode found to be hypoxic and hypotensive with workup showing patient severely anemic (Hgb 2.7), sepsis with WBC 84.8, acute PE, AKI, severe acidosis.  He remains a full code at this time.  He was intubated and requiring vasopressor support and transfused with 2 units of PRBC.  Transferred from [facility] to [facility] for higher level of care.   History of present illness   HPI obtained from medical chart review as patient is intubated and sedated on mechanical ventilation.    53 year old male with history of stage IIIB rectal adenocarcinoma, [id number], diagnosed in 08/2015 and underwent 5Gyx 5 and diverting ileostomy at [facility] initially.  He is currently followed by Dr. Delton Coombes (at [facility]) with most recent chemotherapy 04/23/2019 with palliative FOLFIRI and bevacizumab with no reported no bleeding, nausea, vomiting, or bowel changes and poorly controlled pain despite his fentanyl patches and oxycodone at that time. Additionally in September, he had reported foul smelling discharge leaking from the sides of the drainage tubes for 2 weeks. Of note, patient had CT guided drain placement by IR in April and then a left parasacral drain at [facility] in July with ongoing bloody drainage. [facility] on [facility] left buttock was DC in August at sometime. Drain on left buttock from [facility] still in place. Also of note patient with colostomy and urostomy placed in 2019 with his multiple revisions and surgeries. Had flap dissection on right inner thigh in 2019 that continues to drain fluid and seems to be [progressing per the partner.  CT in August demonstrated progression of left pelvic floor fluid collection/ lesion and fistula and  therefore chemo restarted.  He was noted to have a hemoglobin of 9 with his microcytic anemia in September.  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 [facility] were approached with patient in September, however he refused hospice care at that time.   He presented from home today with acute decompensation with respiratory distress and witnessed syncopal episode and noted to be extremely pale, hypoxic in the 80's.  In ER, he required fluids and vasopressor support for ongoing hypotension.  Noted to be afebrile.  Labs noted WBC 84.8K, Hgb 2.7, Hct 9.9, platelets 729, Na 128, K 5.7, CO2 <7, glucose 315, sCr 2.27, BUN 36, alk phos 139, t bili 0.1, hs trop 9- 15, LA 5.3.  Coags, UA pending.  CTA PE showed segmental PE in the RLL and LUL with bilateral bibasilar opacities/ atelectasis.  He remained altered in the ER with ongoing shock.  Code status discussed with patient's significant other who wished for ongoing aggressive care as long as there "was hope".  Therefore he was intubated.  Transfused with 2 units of emergency released blood.  He was noted to be severely acidotic with ABG 6.88/ 24.2/ 63.1 and undetectable bicarb. He was placed on bicarb gtt. Reports of rectal bleeding, and blood around colostomy and urostomy sites.  He was transferred to [facility] for higher level of care, PCCM accepted.   Past Medical History  Metastatic Stage IIIb rectal adenocarcinoma, recurrent UTI, microcytic anemia, anxiety, depression  Significant Hospital Events   10/17 Admitted/ tx to [facility] from [facility]  Consults:   Procedures:  10/17 ETT >>  Significant Diagnostic Tests:  10/17 CTA PE >> 1. Segmental pulmonary emboli within the RIGHT LOWER lobe and LEFT UPPER lobe. 2. Mild to moderate bilateral dependent and bibasilar opacities/atelectasis. 3. Mild cardiomegaly.  10/17 CTH >> 10/17 CTA a/p >> 10/17 CT pelvis/ right thigh >>  Micro Data:  10/17 SARS CoV2 >>  neg 10/17 MRSA PCR >> 10/17 BCx 2 >> 10/17 UC >> 10/17 fluid cx from drains  Antimicrobials:  10/17 vanc >> 10/17 zosyn >>  Interim history/subjective:  Remains on low dose pressor support this morning. Awake and following commands.  Objective   Blood pressure (!) 91/54, pulse 89, temperature 98.7 F (37.1 C), temperature source Oral, resp. rate 16, height 6' (1.829 m), weight 73.3 kg, SpO2 100 %.    Vent Mode: CPAP;PSV FiO2 (%):  [30 %-100 %] 30 % Set Rate:  [14 bmp-24 bmp] 14 bmp Vt Set:  [620 mL] 620 mL PEEP:  [5 cmH20] 5 cmH20 Pressure Support:  [5 cmH20-8 cmH20] 5 cmH20 Plateau Pressure:  [17 cmH20-22 cmH20] 22 cmH20   Intake/Output Summary (Last 24 hours) at 06/08/2019 1005 Last data filed at 06/08/2019 0600 Gross per 24 hour  Intake 7416.74 ml  Output 1100 ml  Net 6316.74 ml   Filed Weights   06/07/19 1416 06/07/19 1800 06/08/19 0427  Weight: 74 kg 73.3 kg 73.3 kg   Physical Exam: General: Chronically ill-appearing, no acute distress HENT: [place], AT, ETT in place Eyes: EOMI, no scleral icterus Respiratory: Clear to auscultation bilaterally.  No crackles, wheezing or rales Cardiovascular: RRR, -M/R/G, no JVD GI: Colostomy and urostomy in place. Transgluteal drain with dark blood-tinged fluid present Extremities: Pedal edema,-tenderness Neuro: Awake, alert, follows commands GU: Urostomy in place  Resolved Hospital Problem list   NA  Assessment & Plan:   53 year old male with recurrnt rectal cancer s/p chemoradiation complicated by chronic pelvic abscesses with fistulization s/p pelvic exenteration in 05/2018 at [facility]. His pelvic abscesses have been managed at [facility] but he has also had several drains placed with [facility] IR. Currently only has one right transgluteal drain in place. [facility] was transferred from [facility] for acute blood loss anemia, septic shock and pulmonary embolism. Overnight abdominal imaging revealed air/fluid tracking into perineum  concerning for necrotizing soft tissue infection, right internal iliac artery pseudoaneurysm measuring 84m and left common femoral vein DVT. Surgery consulted and deemed further intervention may worsen his current wound issues. Discussed case with IR who will evaluate patient for pelvic abscess management and pseudoaneurysm.  Acute blood loss anemia secondary to suspected pseudoaneurysm vs GI bleed in setting of acute left common femoral DVT and segmental pulmonary emboli -Transfuse 2U PRBC now -Appreciate IR involvement. Plan for urgent pseudoaneurysm embolization and IVC filter placement -Trend CBC -Transfuse Hg goal >7  Septic/Hemorrhagic shock secondary to recurrent pelvic abscess -Management as above for bleed -IVF bolus now -Continue levophed for MAP goal >65 -Continue empiric antibiotics -Follow-up culture data  AKI, likely pre-renal -Monitor UOP/Cr -Avoid nephrotoxins  Acute respiratory insufficiency -PS as tolerated -Remain on mechanical ventilation for pending IR procedures -VAP  Recurrent rectal adenocarcinoma on palliative chemo c/b chronic pelvic abscesses -Patient wishes to remain aggressive with care -Will likely need transfer to tertiary center for definitive care  Best practice:  Diet: NPO Pain/Anxiety/Delirium protocol (if indicated): RASS goal 0/-1, fentanyl/ versed VAP protocol (if indicated): yes DVT prophylaxis: Holding anticoagulation GI prophylaxis: PPI  Glucose control: SSI / CBG q  4 Mobility: BR Code Status: Full  Family Communication: Significant other, [name] [phone]. Attempted to contact spouse by phone. No answer. Left message with callback number. Disposition: ICU  Labs   CBC: Recent Labs  Lab 06/07/19 1420 06/07/19 1913 06/08/19 0306 06/08/19 0344  WBC 84.8* 53.8* 42.3*  --   NEUTROABS 53.8* 51.6*  --   --   HGB 2.7* 7.3* 7.9* 8.5*  HCT 9.9* 20.8* 21.5* 25.0*  MCV 115.1* 92.9 85.3  --   PLT 729* 388 358  --      Basic Metabolic Panel: Recent Labs  Lab 06/07/19 1420 06/07/19 1913 06/08/19 0306 06/08/19 0344  NA 128* 133* 139 137  K 5.7* 4.1 3.7 3.5  CL 98 97* 103  --   CO2 <7* 20* 22  --   GLUCOSE 315* 285* 124*  --   BUN 36* 29* 25*  --   CREATININE 2.27* 1.74* 1.56*  --   CALCIUM 8.2* 6.9* 6.6*  --   MG  --  1.9 1.8  --   PHOS  --  4.7* 3.4  --    GFR: Estimated Creatinine Clearance: 57.4 mL/min (A) (by C-G formula based on SCr of 1.56 mg/dL (H)). Recent Labs  Lab 06/07/19 1420 06/07/19 1913 06/07/19 2142 06/08/19 0306 06/08/19 0748  WBC 84.8* 53.8*  --  42.3*  --   LATICACIDVEN  --  5.3* 5.0*  --  1.5    Liver Function Tests: Recent Labs  Lab 06/07/19 1420 06/07/19 1913 06/08/19 0306  AST 31 32 30  ALT <5 14 16   ALKPHOS 139* 130* 123  BILITOT 0.1* 1.6* 1.3*  PROT 4.5* 4.1* 4.0*  ALBUMIN 1.4* 1.3* 1.2*   No results for input(s): LIPASE, AMYLASE in the last 168 hours. No results for input(s): AMMONIA in the last 168 hours.  ABG    Component Value Date/Time   PHART 7.713 (HH) 06/08/2019 0344   PCO2ART 21.2 (L) 06/08/2019 0344   PO2ART 217.0 (H) 06/08/2019 0344   HCO3 27.0 06/08/2019 0344   TCO2 28 06/08/2019 0344   ACIDBASEDEF 25.8 (H) 06/07/2019 1512   O2SAT 100.0 06/08/2019 0344     Coagulation Profile: Recent Labs  Lab 06/07/19 2142  INR 1.4*    Cardiac Enzymes: Recent Labs  Lab 06/07/19 1913 06/08/19 0306  CKTOTAL 291 261    HbA1C: No results found for: HGBA1C  CBG: Recent Labs  Lab 06/07/19 1419 06/07/19 2030 06/07/19 2344 06/08/19 0413  GLUCAP 273* 180* 110* 126*

## 2019-06-08 NOTE — Progress Notes (Signed)
[facility] Progress Note Patient Name: Jonathan Wright DOB: [birth year] MRN: [id number]   Date of Service  06/08/2019  HPI/Events of Note  Called by Radiology d/t abnormal Abdominal/Pelvis CT Scan results: 1. Pelvic fluid collections now replaced with mottled air, air tracks into the perineum and base of the penis. Findings concerning for necrotizing soft tissue infection. Right-sided drainage catheter remains in place. 2. Filling defect in the left common femoral vein consistent with DVT. The left common iliac vein is decompressed just proximal to the iliac confluence. 3. 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 Recommend vascular surgery or interventional radiology consultation given history of anemia.  eICU Interventions  Ground team notified of Abdomen/Pelvis CT Scan results.      Intervention Category Major Interventions: Other:  Raylen Tangonan Cornelia Copa 06/08/2019, 3:14 AM

## 2019-06-08 NOTE — Progress Notes (Signed)
  Echocardiogram 2D Echocardiogram has been performed.  Jonathan Wright 06/08/2019, 10:51 AM

## 2019-06-08 NOTE — Sedation Documentation (Signed)
5Fr sheath removed from LEFT femoral artery by Armando Reichert, RT. Hemostasis achieve with manual pressure. Groin level 0.

## 2019-06-08 NOTE — Progress Notes (Signed)
Critical PH of 7.71 called to [facility]

## 2019-06-08 NOTE — Sedation Documentation (Signed)
8Fr sheath removed from RIGHT fem vein by Debby Freiberg, RTR. Hemostasis achieved with manual pressure. Groin level 0.

## 2019-06-09 ENCOUNTER — Encounter (HOSPITAL_COMMUNITY): Payer: Self-pay

## 2019-06-09 ENCOUNTER — Inpatient Hospital Stay (HOSPITAL_COMMUNITY): Payer: BC Managed Care – PPO

## 2019-06-09 DIAGNOSIS — I2699 Other pulmonary embolism without acute cor pulmonale: Secondary | ICD-10-CM

## 2019-06-09 DIAGNOSIS — I729 Aneurysm of unspecified site: Secondary | ICD-10-CM

## 2019-06-09 LAB — CBC
HCT: 24.5 % — ABNORMAL LOW (ref 39.0–52.0)
HCT: 25.9 % — ABNORMAL LOW (ref 39.0–52.0)
Hemoglobin: 8.3 g/dL — ABNORMAL LOW (ref 13.0–17.0)
Hemoglobin: 8.8 g/dL — ABNORMAL LOW (ref 13.0–17.0)
MCH: 30 pg (ref 26.0–34.0)
MCH: 30.7 pg (ref 26.0–34.0)
MCHC: 33.9 g/dL (ref 30.0–36.0)
MCHC: 34 g/dL (ref 30.0–36.0)
MCV: 88.4 fL (ref 80.0–100.0)
MCV: 90.2 fL (ref 80.0–100.0)
Platelets: 208 10*3/uL (ref 150–400)
Platelets: 193 10*3/uL (ref 150–400)
RBC: 2.77 MIL/uL — ABNORMAL LOW (ref 4.22–5.81)
RBC: 2.87 MIL/uL — ABNORMAL LOW (ref 4.22–5.81)
RDW: 18.3 % — ABNORMAL HIGH (ref 11.5–15.5)
RDW: 19.1 % — ABNORMAL HIGH (ref 11.5–15.5)
WBC: 31 10*3/uL — ABNORMAL HIGH (ref 4.0–10.5)
WBC: 33.6 10*3/uL — ABNORMAL HIGH (ref 4.0–10.5)
nRBC: 0.3 % — ABNORMAL HIGH (ref 0.0–0.2)
nRBC: 0.1 % (ref 0.0–0.2)

## 2019-06-09 LAB — COMPREHENSIVE METABOLIC PANEL
ALT: 14 U/L (ref 0–44)
AST: 18 U/L (ref 15–41)
Albumin: 1.1 g/dL — ABNORMAL LOW (ref 3.5–5.0)
Alkaline Phosphatase: 105 U/L (ref 38–126)
Anion gap: 8 (ref 5–15)
BUN: 20 mg/dL (ref 6–20)
CO2: 20 mmol/L — ABNORMAL LOW (ref 22–32)
Calcium: 7 mg/dL — ABNORMAL LOW (ref 8.9–10.3)
Chloride: 113 mmol/L — ABNORMAL HIGH (ref 98–111)
Creatinine, Ser: 1.21 mg/dL (ref 0.61–1.24)
GFR calc Af Amer: >60 mL/min (ref >60)
GFR calc non Af Amer: >60 mL/min (ref >60)
Glucose, Bld: 107 mg/dL — ABNORMAL HIGH (ref 70–99)
Potassium: 3.2 mmol/L — ABNORMAL LOW (ref 3.5–5.1)
Sodium: 141 mmol/L (ref 135–145)
Total Bilirubin: 0.7 mg/dL (ref 0.3–1.2)
Total Protein: 3.8 g/dL — ABNORMAL LOW (ref 6.5–8.1)

## 2019-06-09 LAB — CK
Total CK: 90 U/L (ref 49–397)
Total CK: 51 U/L (ref 49–397)

## 2019-06-09 LAB — GLUCOSE, CAPILLARY
Glucose-Capillary: 100 mg/dL — ABNORMAL HIGH (ref 70–99)
Glucose-Capillary: 105 mg/dL — ABNORMAL HIGH (ref 70–99)
Glucose-Capillary: 83 mg/dL (ref 70–99)
Glucose-Capillary: 83 mg/dL (ref 70–99)
Glucose-Capillary: 86 mg/dL (ref 70–99)

## 2019-06-09 LAB — POCT I-STAT 7, (LYTES, BLD GAS, ICA,H+H)
Bicarbonate: 21.2 mmol/L (ref 20.0–28.0)
Calcium, Ion: 1.09 mmol/L — ABNORMAL LOW (ref 1.15–1.40)
HCT: 25 % — ABNORMAL LOW (ref 39.0–52.0)
Hemoglobin: 8.5 g/dL — ABNORMAL LOW (ref 13.0–17.0)
O2 Saturation: 100 %
Patient temperature: 98.6
Potassium: 3.4 mmol/L — ABNORMAL LOW (ref 3.5–5.1)
Sodium: 139 mmol/L (ref 135–145)
TCO2: 22 mmol/L (ref 22–32)
pCO2 arterial: 23.7 mmHg — ABNORMAL LOW (ref 32.0–48.0)
pH, Arterial: 7.561 — ABNORMAL HIGH (ref 7.350–7.450)
pO2, Arterial: 175 mmHg — ABNORMAL HIGH (ref 83.0–108.0)

## 2019-06-09 LAB — PHOSPHORUS: Phosphorus: 3.3 mg/dL (ref 2.5–4.6)

## 2019-06-09 LAB — PROTIME-INR
INR: 1.5 — ABNORMAL HIGH (ref 0.8–1.2)
Prothrombin Time: 17.5 seconds — ABNORMAL HIGH (ref 11.4–15.2)

## 2019-06-09 LAB — MAGNESIUM: Magnesium: 2 mg/dL (ref 1.7–2.4)

## 2019-06-09 MED ORDER — FENTANYL CITRATE (PF) 100 MCG/2ML IJ SOLN
INTRAMUSCULAR | Status: AC
  Administered 2019-06-09: 100 ug
  Filled 2019-06-09: qty 2

## 2019-06-09 MED ORDER — FENTANYL 100 MCG/HR TD PT72
1.0000 | MEDICATED_PATCH | TRANSDERMAL | Status: DC
  Administered 2019-06-09: 1 via TRANSDERMAL
  Filled 2019-06-09: qty 1

## 2019-06-09 MED ORDER — FENTANYL CITRATE (PF) 100 MCG/2ML IJ SOLN: 25.0000 ug | Freq: Three times a day (TID) | INTRAMUSCULAR | Status: DC

## 2019-06-09 MED ORDER — POTASSIUM CHLORIDE 10 MEQ/50ML IV SOLN
10.0000 meq | INTRAVENOUS | Status: AC
  Administered 2019-06-09 (×6): 10 meq via INTRAVENOUS
  Filled 2019-06-09 (×6): qty 50

## 2019-06-09 MED ORDER — FENTANYL CITRATE (PF) 100 MCG/2ML IJ SOLN
50.0000 ug | INTRAMUSCULAR | Status: DC | PRN
  Administered 2019-06-09 – 2019-06-10 (×5): 100 ug via INTRAVENOUS
  Filled 2019-06-09 (×6): qty 2

## 2019-06-09 NOTE — Progress Notes (Addendum)
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 Anselm Pancoast. 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 Anselm Pancoast.  PE and LLE DVT who is not a candidate for anticoagulation s/p IVCF placement in IR 06/08/2019 by Dr. Anselm Pancoast. Patient laying in bed resting he responds to voice, answers questions appropriately, and follows simple commands. He was extubated earlier this AM. Complains of pelvic pain, stable at this time. Right TG drain site stable. Right and left groin incision stable.   Allergies: Patient has no known allergies.  Medications: Prior to Admission medications   Medication Sig Start Date End Date Taking? Authorizing Provider  ALPRAZolam Duanne Moron) 1 MG tablet Take 0.5 tablets (0.5 mg total) by mouth 2 (two) times daily as needed for anxiety. 06/04/19  Yes Derek Jack, MD  fentaNYL (DURAGESIC) 100 MCG/HR Place 3 patches onto the skin every 3 (three) days. 05/29/19  Yes Derek Jack, MD  FLUoxetine (PROZAC) 40 MG capsule TAKE 1 CAPSULE(40 MG) BY MOUTH DAILY 04/21/19  Yes Derek Jack, MD  Oxycodone HCl 20 MG TABS Take 1 tablet (20 mg total) by mouth every 3 (three) hours as needed (severe pain). 06/04/19  Yes Derek Jack, MD  prochlorperazine (COMPAZINE) 10 MG tablet Take 1 tablet (10 mg total) by mouth every 6 (six) hours as needed for nausea or vomiting. 05/21/19  Yes Derek Jack, MD  sulfamethoxazole-trimethoprim (BACTRIM DS) 800-160 MG tablet Take 1 tablet by mouth 2 (two) times daily. 05/21/19   Yes Derek Jack, MD  ondansetron (ZOFRAN ODT) 4 MG disintegrating tablet Take 1 tablet (4 mg total) by mouth every 8 (eight) hours as needed for nausea or vomiting. Patient not taking: Reported on 06/07/2019 03/03/19   Derek Jack, MD     Vital Signs: BP 103/66    Pulse 85    Temp (!) 97.4 F (36.3 C) (Oral)    Resp (!) 21    Ht 6' (1.829 m)    Wt 176 lb 2.4 oz (79.9 kg)    SpO2 99%    BMI 23.89 kg/m   Physical Exam Vitals signs and nursing note reviewed.  Constitutional:      General: He is not in acute distress.    Appearance: Normal appearance. He is diaphoretic.  Pulmonary:     Effort: Pulmonary effort is normal. No respiratory distress.  Abdominal:     Comments: Right TG drain site with approximately 15 cc dark red fluid in suction bulb, difficulty with flushing/aspirating drain. When patient was rolled to flush this drain, bed noted to be saturated with serousanguinous fluid (per RN this is the second time this shift this has happened- probably coming from fistula, but not sure if coming from right TG incision site), dressings were removed, site was cleaned and re-dressed.  Skin:    General: Skin is warm.     Comments: Right and left groin incision sites soft without active bleeding or hematoma.  Neurological:     Mental Status: He is alert and oriented to person, place, and time.  Psychiatric:        Mood and Affect: Mood normal.  Behavior: Behavior normal.        Thought Content: Thought content normal.        Judgment: Judgment normal.     Imaging: Ct Head Wo Contrast  Result Date: 06/08/2019 CLINICAL DATA:  Encephalopathy. EXAM: CT HEAD WITHOUT CONTRAST TECHNIQUE: Contiguous axial images were obtained from the base of the skull through the vertex without intravenous contrast. COMPARISON:  None. FINDINGS: Brain: Prominent CSF density structure in the right posterior fossa is likely an arachnoid cyst, less likely mega cisterna magna. No  intracranial hemorrhage, mass effect, or midline shift. No hydrocephalus. The basilar cisterns are patent. No evidence of territorial infarct or acute ischemia. No extra-axial or intracranial fluid collection. Vascular: No hyperdense vessel or unexpected calcification. Skull: No fracture or focal lesion. Sinuses/Orbits: Slight mucosal thickening of ethmoid air cells. No sinus fluid levels. Right mastoid air cells slightly hypoplastic. Visualized orbits are unremarkable. Other: None. IMPRESSION: No acute intracranial abnormality. 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 Electronically Signed   By: Keith Rake M.D.   On: 06/08/2019 00:48   Ct Angio Chest Pe W And/or Wo Contrast  Result Date: 06/07/2019 CLINICAL DATA:  53 year old male with acute shortness of breath and syncope. EXAM: CT ANGIOGRAPHY CHEST WITH CONTRAST TECHNIQUE: Multidetector CT imaging of the chest was performed using the standard protocol during bolus administration of intravenous contrast. Multiplanar CT image reconstructions and MIPs were obtained to evaluate the vascular anatomy. CONTRAST:  189mL OMNIPAQUE IOHEXOL 350 MG/ML SOLN COMPARISON:  05/29/2018 and prior CTs FINDINGS: Cardiovascular: This is a technically satisfactory study. Segmental pulmonary emboli are identified within the RIGHT LOWER lobe (at least 2 segments) and LEFT UPPER lobe (at least 1 segment), best identified on thin sections. Mild cardiomegaly is noted. There is no evidence of thoracic aortic aneurysm or pericardial effusion. Mediastinum/Nodes: An endotracheal tube is identified with tip 2 cm above the carina. An NG 2 is noted entering the stomach. No mediastinal mass or enlarged lymph nodes. Thyroid gland is unremarkable. Lungs/Pleura: Mild to moderate bilateral dependent and bibasilar opacities/atelectasis noted. No evidence of pulmonary mass, suspicious nodule, airspace disease, pleural effusion or pneumothorax. Upper Abdomen: No acute  abnormality. Musculoskeletal: No acute or suspicious bony abnormality. Review of the MIP images confirms the above findings. IMPRESSION: 1. Segmental pulmonary emboli within the RIGHT LOWER lobe and LEFT UPPER lobe. 2. Mild to moderate bilateral dependent and bibasilar opacities/atelectasis. 3. Mild cardiomegaly. Critical Value/emergent results were called by telephone at the time of interpretation on 06/07/2019 at 3:24 pm to providerBRIAN MILLER , who verbally acknowledged these results. Electronically Signed   By: Margarette Canada M.D.   On: 06/07/2019 15:29   Ct Abdomen Pelvis W Contrast  Result Date: 06/08/2019 CLINICAL DATA:  Metastatic rectal cancer, sepsis. Syncope. In the media. EXAM: CT ABDOMEN AND PELVIS WITH CONTRAST TECHNIQUE: Multidetector CT imaging of the abdomen and pelvis was performed using the standard protocol following bolus administration of intravenous contrast. Performed in conjunction with CT of the right femur, reported separately. CONTRAST:  14mL OMNIPAQUE IOHEXOL 300 MG/ML  SOLN COMPARISON:  Most recent abdominal CT 03/31/2019. Chest CTA earlier this day. FINDINGS: Lower chest: Dependent basilar opacities have improved from chest CT yesterday. Residual streaky opacities in the left greater than right lower lobe. 5 mm left lower lobe pulmonary nodule series 8, image 14. Enteric tube in place. Hepatobiliary: Hepatic steatosis. No focal lesion. Distended gallbladder without calcified gallstone. No biliary dilatation. No pericholecystic inflammation. Pancreas: No ductal dilatation or inflammation. Spleen: Normal in  size without focal abnormality. Adrenals/Urinary Tract: No adrenal nodule. Chronic left renal atrophy. There is excreted IV contrast within both renal collecting systems from prior contrast administration. Prominence of both ureters without frank hydronephrosis. No significant perinephric edema. Urostomy with ileal diversion, slight left ureteral prominence just proximal to the  ureteric anastomosis. Cystectomy. Stomach/Bowel: Enteric tube within the stomach, no gastric wall thickening. No small bowel obstruction. Pelvic small bowel loops intermittently related with chronic pelvic fluid collection. Appendix not clearly visualized. Moderate colonic stool burden. Transverse colonic redundancy. Descending colostomy without peristomal hernia. Prior APR. Vascular/Lymphatic: Filling defect within the left common femoral vein consistent DVT. The left common iliac vein is decompressed just proximal to the iliac confluence. Retroaortic left renal vein. Portal vein is patent. Abdominal aorta is normal in caliber. Rounded enhancing focus in the right presacral space measuring 14 mm is contiguous with the right internal iliac artery in suspicious for pseudoaneurysm, series 7, image 81. No enlarged abdominal lymph nodes. Few prominent bilateral inguinal nodes. Reproductive: Surgically absent prostate gland. Other: The previous left pelvic drainage catheter has been removed. The right transgluteal pelvic drainage catheter remains in place. Pelvic fluid collections now replaced with mottled air, that tracks into the perineum, left posterior upper thigh, and base of the penis. Difficult to delineate pelvic bowel loops from this mild pelvic fluid collection. No upper abdominal free fluid. Musculoskeletal: Heterogeneous appearance of the sacrum and coccyx with mixed density, this may represent radiation necrosis, however infiltrating lesion or osteomyelitis. Stable sclerotic lesion in the right iliac bone. Critical Value/emergent results were called by telephone at the time of interpretation on 06/08/2019 at 3:06 am to Dr summer, who verbally acknowledged these results. IMPRESSION: 1. Pelvic fluid collections now replaced with mottled air, air tracks into the perineum and base of the penis. Findings concerning for necrotizing soft tissue infection. Right-sided drainage catheter remains in place. 2. Filling  defect in the left common femoral vein consistent with DVT. The left common iliac vein is decompressed just proximal to the iliac confluence. 3. 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 Recommend vascular surgery or interventional radiology consultation given history of anemia. 4. Improved aeration at the lung bases compared to CT yesterday. 5 mm left lower lobe pulmonary nodule. Recommend attention on follow-up. 5. Heterogeneous appearance of the sacrum which may represent post radiation change, however metastatic lesion or osteomyelitis also considered. 6. Hepatic steatosis. Electronically Signed   By: Keith Rake M.D.   On: 06/08/2019 03:06   Ir Angiogram Pelvis Selective Or Supraselective  Result Date: 06/09/2019 INDICATION: 53 year old with pulmonary emboli and left lower extremity DVT. Patient has a pelvic pseudoaneurysm that was recently treated and low hemoglobin. Patient is not a candidate for anticoagulation.  EXAM: IVC FILTER PLACEMENT; IVC VENOGRAM; ULTRASOUND FOR VASCULAR ACCESS  Physician: Stephan Minister. Anselm Pancoast, MD  MEDICATIONS: None.  ANESTHESIA/SEDATION: Intubated and sedated from the ICU.  CONTRAST:  154 Visipaque 320 (combination of filter placement and pelvic angiography)  FLUOROSCOPY TIME:  Fluoroscopy Time: 25 minutes, 1,304 mGy (combination of the filter placement and pelvic angiography)  COMPLICATIONS: None immediate.  PROCEDURE: Informed consent was obtained for an IVC venogram and filter placement. Ultrasound demonstrated a patent right common femoral vein. Ultrasound images were obtained for documentation. The right groin was prepped and draped in a sterile fashion. Maximal barrier sterile technique was utilized including caps, mask, sterile gowns, sterile gloves, sterile drape, hand hygiene and skin antiseptic. The skin was anesthetized with  1% lidocaine. A 21 gauge needle was directed into the vein with ultrasound  guidance and a micropuncture dilator set was placed. A wire was advanced into the IVC. The filter sheath was advanced over the wire into the IVC. An IVC venogram was performed. Fluoroscopic images were obtained for documentation. A Bard Denali filter was deployed below the lowest renal vein. A follow-up venogram was performed and the vascular sheath was removed with manual compression.  FINDINGS: IVC was patent. Bilateral renal veins were identified. 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 The filter was deployed below the left renal vein. Follow-up venogram confirmed placement within the IVC and below the renal veins.  IMPRESSION: Successful placement of a retrievable IVC filter.  PLAN: This IVC filter is potentially retrievable. The patient will be assessed for filter retrieval by Interventional Radiology in approximately 8-12 weeks. Further recommendations regarding filter retrieval, continued surveillance or declaration of device permanence, will be made at that time.   Electronically Signed   By: Markus Daft M.D.   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 Patient presented to the hospital with tachycardia and altered mental status. Hemoglobin was 2.7. Patient was found to have small pulmonary emboli and the left lower extremity DVT. CT also demonstrated a pseudoaneurysm in the right pelvis adjacent to the large complex pelvic collection. Patient has been on pressors and requires treatment of the pelvic pseudoaneurysm. Patient also needs an IVC filter because he is not a candidate for anticoagulation at this time. EXAM: PELVIC ARTERIOGRAPHY WITH ANGIOGRAPHY IN THE RIGHT COMMON ILIAC ARTERY, RIGHT INTERNAL ILIAC ARTERY AND RIGHT INTERNAL ILIAC ARTERY BRANCHES. EMBOLIZATION OF RIGHT INTERNAL  ILIAC ARTERY BRANCHES ULTRASOUND GUIDANCE FOR VASCULAR ACCESS MEDICATIONS: None ANESTHESIA/SEDATION: Patient was intubated and already on sedation from the ICU. CONTRAST:  154 mL Visipaque 320 FLUOROSCOPY TIME:  Fluoroscopy Time: 25 minutes, 123456 mGy COMPLICATIONS: None immediate. PROCEDURE: Informed consent was obtained from the patient's son following explanation of the procedure, risks, benefits and alternatives. The patient's son understands, agrees and consents for the procedure. All questions were addressed. A time out was performed prior to the initiation of the procedure. Both groins were prepped and draped in sterile fashion. Ultrasound confirmed a patent left common femoral artery. Ultrasound image was saved for documentation. Skin was anesthetized with 1% lidocaine. Using ultrasound guidance, 21 gauge needle was directed into the left common femoral artery and micropuncture dilator set was placed. Five French vascular sheath was placed. C2 catheter was used to cannulate the right common iliac artery. Stiff Glidewire was advanced into the right internal iliac artery and the C2 catheter was placed in the distal common iliac artery. Angiography was performed. The right internal iliac artery was selected with a renegade STC microcatheter. Multiple branches were selected and until the branch supplying the pseudoaneurysm was cannulated. Vessel supplying the pseudoaneurysm was very irregular and neither wire nor catheter could be advanced distal to the pseudoaneurysm. The proximal aspect of the feeding vessel was embolized with 2 Soft Interlock coils, both measuring 2 mm x 4 cm. Follow-up angiography confirmed occlusion of the feeding artery. The adjacent branches were selected with the Renegade catheter and additional angiography was performed. An adjacent branch appeared to have some retrograde filling of the pseudoaneurysm. Therefore, this vessel was treated with Gel-Foam slurry embolization. Decreased flow  in the vessel following Gel-Foam embolization and no clear evidence for retrograde filling of the pseudoaneurysm following the embolization. Follow-up angiogram was performed through the 5 French catheter. Angiogram through the left groin sheath. Left groin sheath was removed with manual compression. Attempted to use an ExoSeal but the device did not deploy. Hemostasis in left groin following manual compression. FINDINGS: Pseudoaneurysm was identified from a branch of the right internal iliac artery. Branch appears to be coming off the anterior division. The main feeding artery may represent the right internal pudendal artery. The feeding vessel is very irregular. Wire and catheter could not be advanced distal to the origin of the pseudoaneurysm. Once the catheter was within the feeding vessel, contrast was preferentially refluxing which made it difficult to consider particle embolization of the feeding artery. As a result, the feeding branch was treated with 2 soft interlock coils proximal to the pseudoaneurysm. The feeding vessel was successfully embolized. However, the follow-up angiogram demonstrated delayed retrograde filling in the region of the pseudoaneurysm. Adjacent branches were selected and additional angiography was performed. An adjacent irregular branch which may represent the inferior gluteal artery appeared to be supplying the pseudoaneurysm through retrograde flow. This artery was embolized with Gelfoam slurry until there was pruning of the distal vasculature and slow flow within this vessel. Final pelvic angiogram demonstrated no significant filling of the pseudoaneurysm. IMPRESSION: Pseudoaneurysm originating from a branch of the right internal iliac artery anterior division. The feeding artery was embolized with coils proximal to pseudoaneurysm. Adjacent vessel was treated with a Gel-Foam slurry to decrease the risk of retrograde filling of the pseudoaneurysm. Electronically Signed   By: Markus Daft M.D.   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 Patient presented to the hospital with tachycardia and altered mental status. Hemoglobin was 2.7. Patient was found to have small pulmonary emboli and the left lower extremity DVT. CT also demonstrated a pseudoaneurysm in the right pelvis adjacent to the large complex pelvic collection. Patient has been on pressors and requires treatment of the pelvic pseudoaneurysm. Patient also needs an IVC filter because he is not a candidate for anticoagulation at this time. EXAM: PELVIC ARTERIOGRAPHY WITH ANGIOGRAPHY IN THE RIGHT COMMON ILIAC ARTERY, RIGHT INTERNAL ILIAC ARTERY AND RIGHT INTERNAL ILIAC ARTERY BRANCHES. EMBOLIZATION OF RIGHT INTERNAL ILIAC ARTERY BRANCHES ULTRASOUND GUIDANCE FOR VASCULAR ACCESS MEDICATIONS: None ANESTHESIA/SEDATION: Patient was intubated and already on sedation from the ICU. CONTRAST:  154 mL Visipaque 320 FLUOROSCOPY TIME:  Fluoroscopy Time: 25 minutes, 123456 mGy COMPLICATIONS: None immediate. PROCEDURE: Informed consent was obtained from the patient's son following explanation of the procedure, risks, benefits and alternatives. The patient's son understands, agrees and consents for the procedure. All questions were addressed. A time out was performed prior to the initiation of the procedure. Both groins were prepped and draped in sterile fashion. Ultrasound confirmed a patent left common femoral artery. Ultrasound image was saved for documentation. Skin was anesthetized with 1% lidocaine. Using ultrasound guidance, 21 gauge needle was directed into the left common femoral artery and micropuncture dilator set was placed. Five French vascular sheath was placed. C2 catheter was used to cannulate the right common iliac artery. Stiff Glidewire was advanced into the  right internal iliac artery and the C2 catheter was placed in the distal common iliac artery. Angiography was performed. The right internal iliac artery was  selected with a renegade STC microcatheter. Multiple branches were selected and until the branch supplying the pseudoaneurysm was cannulated. Vessel supplying the pseudoaneurysm was very irregular and neither wire nor catheter could be advanced distal to the pseudoaneurysm. The proximal aspect of the feeding vessel was embolized with 2 Soft Interlock coils, both measuring 2 mm x 4 cm. Follow-up angiography confirmed occlusion of the feeding artery. The adjacent branches were selected with the Renegade catheter and additional angiography was performed. An adjacent branch appeared to have some retrograde filling of the pseudoaneurysm. Therefore, this vessel was treated with Gel-Foam slurry embolization. Decreased flow in the vessel following Gel-Foam embolization and no clear evidence for retrograde filling of the pseudoaneurysm following the embolization. Follow-up angiogram was performed through the 5 French catheter. Angiogram through the left groin sheath. Left groin sheath was removed with manual compression. Attempted to use an ExoSeal but the device did not deploy. Hemostasis in left groin following manual compression. FINDINGS: Pseudoaneurysm was identified from a branch of the right internal iliac artery. Branch appears to be coming off the anterior division. The main feeding artery may represent the right internal pudendal artery. The feeding vessel is very irregular. Wire and catheter could not be advanced distal to the origin of the pseudoaneurysm. Once the catheter was within the feeding vessel, contrast was preferentially refluxing which made it difficult to consider particle embolization of the feeding artery. As a result, the feeding branch was treated with 2 soft interlock coils proximal to the pseudoaneurysm. The feeding vessel was successfully  embolized. However, the follow-up angiogram demonstrated delayed retrograde filling in the region of the pseudoaneurysm. Adjacent branches were selected and additional angiography was performed. An adjacent irregular branch which may represent the inferior gluteal artery appeared to be supplying the pseudoaneurysm through retrograde flow. This artery was embolized with Gelfoam slurry until there was pruning of the distal vasculature and slow flow within this vessel. Final pelvic angiogram demonstrated no significant filling of the pseudoaneurysm. IMPRESSION: Pseudoaneurysm originating from a branch of the right internal iliac artery anterior division. The feeding artery was embolized with coils proximal to pseudoaneurysm. Adjacent vessel was treated with a Gel-Foam slurry to decrease the risk of retrograde filling of the pseudoaneurysm. Electronically Signed   By: Markus Daft M.D.   On: 06/08/2019 18:16   Ir Ivc Filter Plmt / S&i /img Guid/mod Sed  Result Date: 06/08/2019 INDICATION: 53 year old with pulmonary emboli and left lower extremity DVT. Patient has a pelvic pseudoaneurysm that was recently treated and low hemoglobin. Patient is not a candidate for anticoagulation. EXAM: IVC FILTER PLACEMENT; IVC VENOGRAM; ULTRASOUND FOR VASCULAR ACCESS Physician: Stephan Minister. Anselm Pancoast, MD MEDICATIONS: None. ANESTHESIA/SEDATION: Intubated and sedated from the ICU. CONTRAST:  154 Visipaque 320 (combination of filter placement and pelvic angiography) FLUOROSCOPY TIME:  Fluoroscopy Time: 25 minutes, 1,304 mGy (combination of the filter placement and pelvic angiography) COMPLICATIONS: None immediate. PROCEDURE: Informed consent was obtained for an IVC venogram and filter placement. Ultrasound demonstrated a patent right common femoral vein. Ultrasound images were obtained for documentation. The right groin was prepped and draped in a sterile fashion. Maximal barrier sterile technique was utilized including caps, mask, sterile gowns,  sterile gloves, sterile drape, hand hygiene and skin antiseptic. The skin was anesthetized with 1% lidocaine. A 21 gauge needle was directed into the vein with ultrasound guidance and a micropuncture dilator set was placed. A wire was advanced into the IVC. The filter sheath was advanced over the wire into the IVC. An IVC venogram  was performed. Fluoroscopic images were obtained for documentation. A Bard Denali filter was deployed below the lowest renal vein. A follow-up venogram was performed and the vascular sheath was removed with manual compression. FINDINGS: IVC was patent. Bilateral renal veins were identified. 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 The filter was deployed below the left renal vein. Follow-up venogram confirmed placement within the IVC and below the renal veins. IMPRESSION: Successful placement of a retrievable IVC filter. PLAN: This IVC filter is potentially retrievable. The patient will be assessed for filter retrieval by Interventional Radiology in approximately 8-12 weeks. Further recommendations regarding filter retrieval, continued surveillance or declaration of device permanence, will be made at that time. Electronically Signed   By: Markus Daft M.D.   On: 06/08/2019 18:21   Ct Femur Right Wo Contrast  Result Date: 06/08/2019 CLINICAL DATA:  Sepsis. 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 COMPARISON:  CT 12/25/2018 FINDINGS: Bones/Joint/Cartilage Heterogeneous appearance of the visualized distal sacrum and coccyx suggesting acute on chronic osteomyelitis. Right hip joint is intact. No fracture or dislocation. No destructive bone lesion of the right femur. Joint spaces of the hip and knee are maintained. No knee joint effusion. Ligaments Suboptimally assessed by CT.  Muscles and Tendons Normal right lower extremity muscle bulk without atrophy or fatty infiltration. Tendinous structures grossly intact. Soft tissues Along the course of right transgluteal drainage catheter in the right peroneal soft tissues is a fluid and air collection measuring approximately 3.8 x 2.1 cm (series 5, image 48) which appears contiguous with the larger intrapelvic collection. Please see dedicated CT of the abdomen and pelvis for further delineation of the intrapelvic findings IMPRESSION: 1. Heterogeneous appearance of the visualized distal sacrum and coccyx compatible with acute on chronic osteomyelitis. 2. Along the course of right transgluteal drainage catheter is a fluid and air collection measuring approximately 3.8 x 2.1 cm which appears contiguous with the larger intrapelvic collection. 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 3. Please see dedicated CT of the abdomen and pelvis for further delineation of the intrapelvic findings. Electronically Signed   By: Davina Poke M.D.   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 Patient presented to the hospital with tachycardia and altered mental status. Hemoglobin was 2.7. Patient was found to have small pulmonary emboli and the left lower extremity DVT. CT also demonstrated a pseudoaneurysm in the right pelvis adjacent to the large complex pelvic collection. Patient has been on pressors and requires treatment of the pelvic pseudoaneurysm. Patient also needs an IVC filter because he is not a candidate for anticoagulation at this time. EXAM: PELVIC ARTERIOGRAPHY WITH ANGIOGRAPHY IN THE RIGHT COMMON ILIAC ARTERY, RIGHT INTERNAL ILIAC ARTERY AND RIGHT INTERNAL ILIAC ARTERY BRANCHES. EMBOLIZATION OF RIGHT INTERNAL ILIAC ARTERY BRANCHES ULTRASOUND GUIDANCE FOR  VASCULAR ACCESS MEDICATIONS: None ANESTHESIA/SEDATION: Patient was intubated and already on sedation from the ICU. CONTRAST:  154 mL Visipaque 320 FLUOROSCOPY TIME:  Fluoroscopy Time: 25 minutes, 123456 mGy COMPLICATIONS: None immediate. PROCEDURE: Informed consent was obtained from the patient's son following explanation of the procedure, risks, benefits and alternatives. The patient's son understands, agrees and consents for the procedure. All questions were  addressed. A time out was performed prior to the initiation of the procedure. Both groins were prepped and draped in sterile fashion. Ultrasound confirmed a patent left common femoral artery. Ultrasound image was saved for documentation. Skin was anesthetized with 1% lidocaine. Using ultrasound guidance, 21 gauge needle was directed into the left common femoral artery and micropuncture dilator set was placed. Five French vascular sheath was placed. C2 catheter was used to cannulate the right common iliac artery. Stiff Glidewire was advanced into the right internal iliac artery and the C2 catheter was placed in the distal common iliac artery. Angiography was performed. The right internal iliac artery was selected with a renegade STC microcatheter. Multiple branches were selected and until the branch supplying the pseudoaneurysm was cannulated. Vessel supplying the pseudoaneurysm was very irregular and neither wire nor catheter could be advanced distal to the pseudoaneurysm. The proximal aspect of the feeding vessel was embolized with 2 Soft Interlock coils, both measuring 2 mm x 4 cm. Follow-up angiography confirmed occlusion of the feeding artery. The adjacent branches were selected with the Renegade catheter and additional angiography was performed. An adjacent branch appeared to have some retrograde filling of the pseudoaneurysm. Therefore, this vessel was treated with Gel-Foam slurry embolization. Decreased flow in the vessel following Gel-Foam embolization  and no clear evidence for retrograde filling of the pseudoaneurysm following the embolization. Follow-up angiogram was performed through the 5 French catheter. Angiogram through the left groin sheath. Left groin sheath was removed with manual compression. Attempted to use an ExoSeal but the device did not deploy. Hemostasis in left groin following manual compression. FINDINGS: Pseudoaneurysm was identified from a branch of the right internal iliac artery. Branch appears to be coming off the anterior division. The main feeding artery may represent the right internal pudendal artery. The feeding vessel is very irregular. Wire and catheter could not be advanced distal to the origin of the pseudoaneurysm. Once the catheter was within the feeding vessel, contrast was preferentially refluxing which made it difficult to consider particle embolization of the feeding artery. As a result, the feeding branch was treated with 2 soft interlock coils proximal to the pseudoaneurysm. The feeding vessel was successfully embolized. However, the follow-up angiogram demonstrated delayed retrograde filling in the region of the pseudoaneurysm. Adjacent branches were selected and additional angiography was performed. An adjacent irregular branch which may represent the inferior gluteal artery appeared to be supplying the pseudoaneurysm through retrograde flow. This artery was embolized with Gelfoam slurry until there was pruning of the distal vasculature and slow flow within this vessel. Final pelvic angiogram demonstrated no significant filling of the pseudoaneurysm. IMPRESSION: Pseudoaneurysm originating from a branch of the right internal iliac artery anterior division. The feeding artery was embolized with coils proximal to pseudoaneurysm. Adjacent vessel was treated with a Gel-Foam slurry to decrease the risk of retrograde filling of the pseudoaneurysm. Electronically Signed   By: Markus Daft M.D.   On: 06/08/2019 18:16   Dg Chest  Port 1 View  Result Date: 06/07/2019 CLINICAL DATA:  Shortness of breath. Rectal carcinoma. EXAM: PORTABLE CHEST 1 VIEW COMPARISON:  06/19/2016 FINDINGS: Right arm PICC line remains in appropriate position. Both lungs are clear. Heart size is normal. IMPRESSION: No active disease. Electronically Signed   By: Marlaine Hind M.D.   On: 06/07/2019 14:27   [place] Guide Roadmapping  Result Date: 06/08/2019 INDICATION: 53 year old male with a history of rectal cancer and chronic pelvic abscess/necrotic fluid  collection with a drain. Patient presented to the hospital with tachycardia and altered mental status. Hemoglobin was 2.7. Patient was found to have small pulmonary emboli and the left lower extremity DVT. CT also demonstrated a pseudoaneurysm in the right pelvis adjacent to the large complex pelvic collection. Patient has been on pressors and requires treatment of the pelvic pseudoaneurysm. Patient also needs an IVC filter because he is not a candidate for anticoagulation at this time. EXAM: PELVIC ARTERIOGRAPHY WITH ANGIOGRAPHY IN THE RIGHT COMMON ILIAC ARTERY, RIGHT INTERNAL ILIAC ARTERY AND RIGHT INTERNAL ILIAC ARTERY BRANCHES. EMBOLIZATION OF RIGHT INTERNAL ILIAC ARTERY BRANCHES ULTRASOUND GUIDANCE FOR VASCULAR ACCESS MEDICATIONS: None ANESTHESIA/SEDATION: Patient was intubated and already on sedation from the ICU. CONTRAST:  154 mL Visipaque 320 FLUOROSCOPY TIME:  Fluoroscopy Time: 25 minutes, 123456 mGy COMPLICATIONS: None immediate. PROCEDURE: Informed consent was obtained from the patient's son following explanation of the procedure, risks, benefits and alternatives. The patient's son understands, agrees and consents for the procedure. All questions were addressed. A time out was performed prior to the initiation of the procedure. Both groins were prepped and draped in sterile fashion. Ultrasound confirmed a patent left common femoral artery. Ultrasound image was saved  for documentation. Skin was anesthetized with 1% lidocaine. Using ultrasound guidance, 21 gauge needle was directed into the left common femoral artery and micropuncture dilator set was placed. Five French vascular sheath was placed. C2 catheter was used to cannulate the right common iliac artery. Stiff Glidewire was advanced into the right internal iliac artery and the C2 catheter was placed in the distal common iliac artery. Angiography was performed. The right internal iliac artery was selected with a renegade STC microcatheter. Multiple branches were selected and until the branch supplying the pseudoaneurysm was cannulated. Vessel supplying the pseudoaneurysm was very irregular and neither wire nor catheter could be advanced distal to the pseudoaneurysm. The proximal aspect of the feeding vessel was embolized with 2 Soft Interlock coils, both measuring 2 mm x 4 cm. Follow-up angiography confirmed occlusion of the feeding artery. The adjacent branches were selected with the Renegade catheter and additional angiography was performed. An adjacent branch appeared to have some retrograde filling of the pseudoaneurysm. Therefore, this vessel was treated with Gel-Foam slurry embolization. Decreased flow in the vessel following Gel-Foam embolization and no clear evidence for retrograde filling of the pseudoaneurysm following the embolization. Follow-up angiogram was performed through the 5 French catheter. Angiogram through the left groin sheath. Left groin sheath was removed with manual compression. Attempted to use an ExoSeal but the device did not deploy. Hemostasis in left groin following manual compression. FINDINGS: Pseudoaneurysm was identified from a branch of the right internal iliac artery. Branch appears to be coming off the anterior division. The main feeding artery may represent the right internal pudendal artery. The feeding vessel is very irregular. Wire and catheter could not be advanced distal to the  origin of the pseudoaneurysm. Once the catheter was within the feeding vessel, contrast was preferentially refluxing which made it difficult to consider particle embolization of the feeding artery. As a result, the feeding branch was treated with 2 soft interlock coils proximal to the pseudoaneurysm. The feeding vessel was successfully embolized. However, the follow-up angiogram demonstrated delayed retrograde filling in the region of the pseudoaneurysm. Adjacent branches were selected and additional angiography was performed. An adjacent irregular branch which may represent the inferior gluteal artery appeared to be supplying the pseudoaneurysm through retrograde flow. This artery was embolized with Gelfoam slurry until there  was pruning of the distal vasculature and slow flow within this vessel. Final pelvic angiogram demonstrated no significant filling of the pseudoaneurysm. IMPRESSION: Pseudoaneurysm originating from a branch of the right internal iliac artery anterior division. The feeding artery was embolized with coils proximal to pseudoaneurysm. Adjacent vessel was treated with a Gel-Foam slurry to decrease the risk of retrograde filling of the pseudoaneurysm. Electronically Signed   By: Markus Daft M.D.   On: 06/08/2019 18:16    Labs:  CBC: Recent Labs    06/08/19 1012 06/08/19 1539 06/08/19 2123 06/09/19 0017 06/09/19 0406  WBC 30.3* 37.6* 34.4*  --  31.0*  HGB 5.6* 8.3* 8.7* 8.5* 8.3*  HCT 16.3* 23.9* 24.9* 25.0* 24.5*  PLT 235 248 205  --  208    COAGS: Recent Labs    12/09/18 1045 06/07/19 2142 06/08/19 1539 06/09/19 0406  INR 1.1 1.4* 1.5* 1.5*    BMP: Recent Labs    06/07/19 1913 06/08/19 0306 06/08/19 0344 06/08/19 1539 06/09/19 0017 06/09/19 0406  NA 133* 139 137 140 139 141  K 4.1 3.7 3.5 3.6 3.4* 3.2*  CL 97* 103  --  110  --  113*  CO2 20* 22  --  21*  --  20*  GLUCOSE 285* 124*  --  127*  --  107*  BUN 29* 25*  --  21*  --  20  CALCIUM 6.9* 6.6*  --   6.2*  --  7.0*  CREATININE 1.74* 1.56*  --  1.41*  --  1.21  GFRNONAA 44* 50*  --  57*  --  >60  GFRAA 51* 58*  --  >60  --  >60    LIVER FUNCTION TESTS: Recent Labs    06/07/19 1913 06/08/19 0306 06/08/19 1539 06/09/19 0406  BILITOT 1.6* 1.3* 1.0 0.7  AST 32 30 22 18   ALT 14 16 15 14   ALKPHOS 130* 123 106 105  PROT 4.1* 4.0* 3.7* 3.8*  ALBUMIN 1.3* 1.2* 1.1* 1.1*    Assessment and Plan:  Rectal cancer with complex surgical history including a pelvic exenteration with subsequent development of 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 Anselm Pancoast. Right TG drain stable with approximately 15 cc dark red fluid in suction bulb. Difficulty with flushing/aspirating drain, in addition patient's bed continuously saturated with serosanguinous fluid (probably coming from fistula, but not sure if coming from right TG incision site). Discussed case with Dr. Kathlene Cote who recommends possible drain upsize/reposition in IR- will place order for this, plan for procedure tentatively for 06/10/2019, schedule permitting.   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 Anselm Pancoast.  Right and left groin incisions stable. Further plans per CCM/CCS- appreciate and agree with management.  PE and LLE DVT who is not a candidate for anticoagulation s/p IVCF placement in IR 06/08/2019 by Dr. Anselm Pancoast. Right groin incision stable. Further plans per CCM/CCS- appreciate and agree with management.   Electronically Signed: Earley Abide, PA-C 06/09/2019, 10:52 AM   I spent a total of 35 Minutes at the the patient's bedside AND on the patient's hospital floor or unit, greater than 50% of which was counseling/coordinating care for right TG drain, right pelvic pseudoaneurysm, and IVCF placement.

## 2019-06-09 NOTE — Progress Notes (Signed)
Patient ID: Jonathan Wright, male   DOB: August 06, 1966, 53 y.o.   MRN: [id number]       Subjective: Patient wide awake on vent.  Texts to [facility] thirsty".  Denies any abdominal pain currently.    Objective: Vital signs in last 24 hours: Temp:  [97.4 F (36.3 C)-99.1 F (37.3 C)] 97.4 F (36.3 C) (10/19 0756) Pulse Rate:  [40-101] 73 (10/19 0600) Resp:  [8-30] 14 (10/19 0600) BP: (67-134)/(54-87) 102/72 (10/19 0600) SpO2:  [83 %-100 %] 100 % (10/19 0745) FiO2 (%):  [30 %-40 %] 40 % (10/19 0800) Weight:  [79.9 kg] 79.9 kg (10/19 0500) Last BM Date: 06/08/19(colostomy)  Intake/Output from previous day: 10/18 0701 - 10/19 0700 In: 3078.6 [I.V.:1312.6; Blood:615; IV Piggyback:1151] Out: 650 [Urine:650] Intake/Output this shift: No intake/output data recorded.  PE: Heart: regular Lungs: CTAB, on vent Abd: soft, loop ileostomy with no output currently.  Urostomy in place in RLQ.  JP drain with dark old bloody output but is malodorous as well.  B groin sites covered with gauze and tegaderm.  Lab Results:  Recent Labs    06/08/19 2123 06/09/19 0017 06/09/19 0406  WBC 34.4*  --  31.0*  HGB 8.7* 8.5* 8.3*  HCT 24.9* 25.0* 24.5*  PLT 205  --  208   BMET Recent Labs    06/08/19 1539 06/09/19 0017 06/09/19 0406  NA 140 139 141  K 3.6 3.4* 3.2*  CL 110  --  113*  CO2 21*  --  20*  GLUCOSE 127*  --  107*  BUN 21*  --  20  CREATININE 1.41*  --  1.21  CALCIUM 6.2*  --  7.0*   PT/INR Recent Labs    06/08/19 1539 06/09/19 0406  LABPROT 17.6* 17.5*  INR 1.5* 1.5*   CMP     Component Value Date/Time   NA 141 06/09/2019 0406   K 3.2 (L) 06/09/2019 0406   CL 113 (H) 06/09/2019 0406   CO2 20 (L) 06/09/2019 0406   GLUCOSE 107 (H) 06/09/2019 0406   BUN 20 06/09/2019 0406   CREATININE 1.21 06/09/2019 0406   CALCIUM 7.0 (L) 06/09/2019 0406   PROT 3.8 (L) 06/09/2019 0406   ALBUMIN 1.1 (L) 06/09/2019 0406   AST 18 06/09/2019 0406   ALT 14 06/09/2019 0406   ALKPHOS 105  06/09/2019 0406   BILITOT 0.7 06/09/2019 0406   GFRNONAA >60 06/09/2019 0406   GFRAA >60 06/09/2019 0406   Lipase     Component Value Date/Time   LIPASE 14 06/19/2016 1707       Studies/Results: Ct Head Wo Contrast  Result Date: 06/08/2019 CLINICAL DATA:  Encephalopathy. EXAM: CT HEAD WITHOUT CONTRAST TECHNIQUE: Contiguous axial images were obtained from the base of the skull through the vertex without intravenous contrast. COMPARISON:  None. FINDINGS: Brain: Prominent CSF density structure in the right posterior fossa is likely an arachnoid cyst, less likely mega cisterna magna. No intracranial hemorrhage, mass effect, or midline shift. No hydrocephalus. The basilar cisterns are patent. No evidence of territorial infarct or acute ischemia. No extra-axial or intracranial fluid collection. Vascular: No hyperdense vessel or unexpected calcification. Skull: No fracture or focal lesion. Sinuses/Orbits: Slight mucosal thickening of ethmoid air cells. No sinus fluid levels. Right mastoid air cells slightly hypoplastic. Visualized orbits are unremarkable. Other: None. IMPRESSION: No acute intracranial abnormality. 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 Electronically Signed   By: Keith Rake M.D.   On: 06/08/2019 00:48   Ct  Angio Chest Pe W And/or Wo Contrast  Result Date: 06/07/2019 CLINICAL DATA:  53 year old male with acute shortness of breath and syncope. EXAM: CT ANGIOGRAPHY CHEST WITH CONTRAST TECHNIQUE: Multidetector CT imaging of the chest was performed using the standard protocol during bolus administration of intravenous contrast. Multiplanar CT image reconstructions and MIPs were obtained to evaluate the vascular anatomy. CONTRAST:  1104mL OMNIPAQUE IOHEXOL 350 MG/ML SOLN COMPARISON:  05/29/2018 and prior CTs FINDINGS: Cardiovascular: This is a technically satisfactory study. Segmental pulmonary emboli are identified within the RIGHT LOWER lobe (at least 2 segments)  and LEFT UPPER lobe (at least 1 segment), best identified on thin sections. Mild cardiomegaly is noted. There is no evidence of thoracic aortic aneurysm or pericardial effusion. Mediastinum/Nodes: An endotracheal tube is identified with tip 2 cm above the carina. An NG 2 is noted entering the stomach. No mediastinal mass or enlarged lymph nodes. Thyroid gland is unremarkable. Lungs/Pleura: Mild to moderate bilateral dependent and bibasilar opacities/atelectasis noted. No evidence of pulmonary mass, suspicious nodule, airspace disease, pleural effusion or pneumothorax. Upper Abdomen: No acute abnormality. Musculoskeletal: No acute or suspicious bony abnormality. Review of the MIP images confirms the above findings. IMPRESSION: 1. Segmental pulmonary emboli within the RIGHT LOWER lobe and LEFT UPPER lobe. 2. Mild to moderate bilateral dependent and bibasilar opacities/atelectasis. 3. Mild cardiomegaly. Critical Value/emergent results were called by telephone at the time of interpretation on 06/07/2019 at 3:24 pm to providerBRIAN MILLER , who verbally acknowledged these results. Electronically Signed   By: Margarette Canada M.D.   On: 06/07/2019 15:29   Ct Abdomen Pelvis W Contrast  Result Date: 06/08/2019 CLINICAL DATA:  Metastatic rectal cancer, sepsis. Syncope. In the media. EXAM: CT ABDOMEN AND PELVIS WITH CONTRAST TECHNIQUE: Multidetector CT imaging of the abdomen and pelvis was performed using the standard protocol following bolus administration of intravenous contrast. Performed in conjunction with CT of the right femur, reported separately. CONTRAST:  29mL OMNIPAQUE IOHEXOL 300 MG/ML  SOLN COMPARISON:  Most recent abdominal CT 03/31/2019. Chest CTA earlier this day. FINDINGS: Lower chest: Dependent basilar opacities have improved from chest CT yesterday. Residual streaky opacities in the left greater than right lower lobe. 5 mm left lower lobe pulmonary nodule series 8, image 14. Enteric tube in place.  Hepatobiliary: Hepatic steatosis. No focal lesion. Distended gallbladder without calcified gallstone. No biliary dilatation. No pericholecystic inflammation. Pancreas: No ductal dilatation or inflammation. Spleen: Normal in size without focal abnormality. Adrenals/Urinary Tract: No adrenal nodule. Chronic left renal atrophy. There is excreted IV contrast within both renal collecting systems from prior contrast administration. Prominence of both ureters without frank hydronephrosis. No significant perinephric edema. Urostomy with ileal diversion, slight left ureteral prominence just proximal to the ureteric anastomosis. Cystectomy. Stomach/Bowel: Enteric tube within the stomach, no gastric wall thickening. No small bowel obstruction. Pelvic small bowel loops intermittently related with chronic pelvic fluid collection. Appendix not clearly visualized. Moderate colonic stool burden. Transverse colonic redundancy. Descending colostomy without peristomal hernia. Prior APR. Vascular/Lymphatic: Filling defect within the left common femoral vein consistent DVT. The left common iliac vein is decompressed just proximal to the iliac confluence. Retroaortic left renal vein. Portal vein is patent. Abdominal aorta is normal in caliber. Rounded enhancing focus in the right presacral space measuring 14 mm is contiguous with the right internal iliac artery in suspicious for pseudoaneurysm, series 7, image 81. No enlarged abdominal lymph nodes. Few prominent bilateral inguinal nodes. Reproductive: Surgically absent prostate gland. Other: The previous left pelvic drainage catheter has been  removed. The right transgluteal pelvic drainage catheter remains in place. Pelvic fluid collections now replaced with mottled air, that tracks into the perineum, left posterior upper thigh, and base of the penis. Difficult to delineate pelvic bowel loops from this mild pelvic fluid collection. No upper abdominal free fluid. Musculoskeletal:  Heterogeneous appearance of the sacrum and coccyx with mixed density, this may represent radiation necrosis, however infiltrating lesion or osteomyelitis. Stable sclerotic lesion in the right iliac bone. Critical Value/emergent results were called by telephone at the time of interpretation on 06/08/2019 at 3:06 am to Dr summer, who verbally acknowledged these results. IMPRESSION: 1. Pelvic fluid collections now replaced with mottled air, air tracks into the perineum and base of the penis. Findings concerning for necrotizing soft tissue infection. Right-sided drainage catheter remains in place. 2. Filling defect in the left common femoral vein consistent with DVT. The left common iliac vein is decompressed just proximal to the iliac confluence. 3. 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 Recommend vascular surgery or interventional radiology consultation given history of anemia. 4. Improved aeration at the lung bases compared to CT yesterday. 5 mm left lower lobe pulmonary nodule. Recommend attention on follow-up. 5. Heterogeneous appearance of the sacrum which may represent post radiation change, however metastatic lesion or osteomyelitis also considered. 6. Hepatic steatosis. Electronically Signed   By: Keith Rake M.D.   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 Patient presented to the hospital with tachycardia and altered mental status. Hemoglobin was 2.7. Patient was found to have small pulmonary emboli and the left lower extremity DVT. CT also demonstrated a pseudoaneurysm in the right pelvis adjacent to the large complex pelvic collection. Patient has been on pressors and requires treatment of the pelvic pseudoaneurysm. Patient also needs an IVC filter because  he is not a candidate for anticoagulation at this time. EXAM: PELVIC ARTERIOGRAPHY WITH ANGIOGRAPHY IN THE RIGHT COMMON ILIAC ARTERY, RIGHT INTERNAL ILIAC ARTERY AND RIGHT INTERNAL ILIAC ARTERY BRANCHES. EMBOLIZATION OF RIGHT INTERNAL ILIAC ARTERY BRANCHES ULTRASOUND GUIDANCE FOR VASCULAR ACCESS MEDICATIONS: None ANESTHESIA/SEDATION: Patient was intubated and already on sedation from the ICU. CONTRAST:  154 mL Visipaque 320 FLUOROSCOPY TIME:  Fluoroscopy Time: 25 minutes, 123456 mGy COMPLICATIONS: None immediate. PROCEDURE: Informed consent was obtained from the patient's son following explanation of the procedure, risks, benefits and alternatives. The patient's son understands, agrees and consents for the procedure. All questions were addressed. A time out was performed prior to the initiation of the procedure. Both groins were prepped and draped in sterile fashion. Ultrasound confirmed a patent left common femoral artery. Ultrasound image was saved for documentation. Skin was anesthetized with 1% lidocaine. Using ultrasound guidance, 21 gauge needle was directed into the left common femoral artery and micropuncture dilator set was placed. Five French vascular sheath was placed. C2 catheter was used to cannulate the right common iliac artery. Stiff Glidewire was advanced into the right internal iliac artery and the C2 catheter was placed in the distal common iliac artery. Angiography was performed. The right internal iliac artery was selected with a renegade STC microcatheter. Multiple branches were selected and until the branch supplying the pseudoaneurysm was cannulated. Vessel supplying the pseudoaneurysm was very irregular and neither wire nor catheter could be advanced distal to the pseudoaneurysm. The proximal aspect of the feeding  vessel was embolized with 2 Soft Interlock coils, both measuring 2 mm x 4 cm. Follow-up angiography confirmed occlusion of the feeding artery. The adjacent branches were selected  with the Renegade catheter and additional angiography was performed. An adjacent branch appeared to have some retrograde filling of the pseudoaneurysm. Therefore, this vessel was treated with Gel-Foam slurry embolization. Decreased flow in the vessel following Gel-Foam embolization and no clear evidence for retrograde filling of the pseudoaneurysm following the embolization. Follow-up angiogram was performed through the 5 French catheter. Angiogram through the left groin sheath. Left groin sheath was removed with manual compression. Attempted to use an ExoSeal but the device did not deploy. Hemostasis in left groin following manual compression. FINDINGS: Pseudoaneurysm was identified from a branch of the right internal iliac artery. Branch appears to be coming off the anterior division. The main feeding artery may represent the right internal pudendal artery. The feeding vessel is very irregular. Wire and catheter could not be advanced distal to the origin of the pseudoaneurysm. Once the catheter was within the feeding vessel, contrast was preferentially refluxing which made it difficult to consider particle embolization of the feeding artery. As a result, the feeding branch was treated with 2 soft interlock coils proximal to the pseudoaneurysm. The feeding vessel was successfully embolized. However, the follow-up angiogram demonstrated delayed retrograde filling in the region of the pseudoaneurysm. Adjacent branches were selected and additional angiography was performed. An adjacent irregular branch which may represent the inferior gluteal artery appeared to be supplying the pseudoaneurysm through retrograde flow. This artery was embolized with Gelfoam slurry until there was pruning of the distal vasculature and slow flow within this vessel. Final pelvic angiogram demonstrated no significant filling of the pseudoaneurysm. IMPRESSION: Pseudoaneurysm originating from a branch of the right internal iliac artery  anterior division. The feeding artery was embolized with coils proximal to pseudoaneurysm. Adjacent vessel was treated with a Gel-Foam slurry to decrease the risk of retrograde filling of the pseudoaneurysm. Electronically Signed   By: Markus Daft M.D.   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 Patient presented to the hospital with tachycardia and altered mental status. Hemoglobin was 2.7. Patient was found to have small pulmonary emboli and the left lower extremity DVT. CT also demonstrated a pseudoaneurysm in the right pelvis adjacent to the large complex pelvic collection. Patient has been on pressors and requires treatment of the pelvic pseudoaneurysm. Patient also needs an IVC filter because he is not a candidate for anticoagulation at this time. EXAM: PELVIC ARTERIOGRAPHY WITH ANGIOGRAPHY IN THE RIGHT COMMON ILIAC ARTERY, RIGHT INTERNAL ILIAC ARTERY AND RIGHT INTERNAL ILIAC ARTERY BRANCHES. EMBOLIZATION OF RIGHT INTERNAL ILIAC ARTERY BRANCHES ULTRASOUND GUIDANCE FOR VASCULAR ACCESS MEDICATIONS: None ANESTHESIA/SEDATION: Patient was intubated and already on sedation from the ICU. CONTRAST:  154 mL Visipaque 320 FLUOROSCOPY TIME:  Fluoroscopy Time: 25 minutes, 123456 mGy COMPLICATIONS: None immediate. PROCEDURE: Informed consent was obtained from the patient's son following explanation of the procedure, risks, benefits and alternatives. The patient's son understands, agrees and consents for the procedure. All questions were addressed. A time out was performed prior to the initiation of the procedure. Both groins were prepped and draped in sterile fashion. Ultrasound confirmed a patent left common femoral artery. Ultrasound image was saved for documentation. Skin was anesthetized with 1% lidocaine. Using ultrasound guidance, 21  gauge needle was  directed into the left common femoral artery and micropuncture dilator set was placed. Five French vascular sheath was placed. C2 catheter was used to cannulate the right common iliac artery. Stiff Glidewire was advanced into the right internal iliac artery and the C2 catheter was placed in the distal common iliac artery. Angiography was performed. The right internal iliac artery was selected with a renegade STC microcatheter. Multiple branches were selected and until the branch supplying the pseudoaneurysm was cannulated. Vessel supplying the pseudoaneurysm was very irregular and neither wire nor catheter could be advanced distal to the pseudoaneurysm. The proximal aspect of the feeding vessel was embolized with 2 Soft Interlock coils, both measuring 2 mm x 4 cm. Follow-up angiography confirmed occlusion of the feeding artery. The adjacent branches were selected with the Renegade catheter and additional angiography was performed. An adjacent branch appeared to have some retrograde filling of the pseudoaneurysm. Therefore, this vessel was treated with Gel-Foam slurry embolization. Decreased flow in the vessel following Gel-Foam embolization and no clear evidence for retrograde filling of the pseudoaneurysm following the embolization. Follow-up angiogram was performed through the 5 French catheter. Angiogram through the left groin sheath. Left groin sheath was removed with manual compression. Attempted to use an ExoSeal but the device did not deploy. Hemostasis in left groin following manual compression. FINDINGS: Pseudoaneurysm was identified from a branch of the right internal iliac artery. Branch appears to be coming off the anterior division. The main feeding artery may represent the right internal pudendal artery. The feeding vessel is very irregular. Wire and catheter could not be advanced distal to the origin of the pseudoaneurysm. Once the catheter was within the feeding vessel, contrast was  preferentially refluxing which made it difficult to consider particle embolization of the feeding artery. As a result, the feeding branch was treated with 2 soft interlock coils proximal to the pseudoaneurysm. The feeding vessel was successfully embolized. However, the follow-up angiogram demonstrated delayed retrograde filling in the region of the pseudoaneurysm. Adjacent branches were selected and additional angiography was performed. An adjacent irregular branch which may represent the inferior gluteal artery appeared to be supplying the pseudoaneurysm through retrograde flow. This artery was embolized with Gelfoam slurry until there was pruning of the distal vasculature and slow flow within this vessel. Final pelvic angiogram demonstrated no significant filling of the pseudoaneurysm. IMPRESSION: Pseudoaneurysm originating from a branch of the right internal iliac artery anterior division. The feeding artery was embolized with coils proximal to pseudoaneurysm. Adjacent vessel was treated with a Gel-Foam slurry to decrease the risk of retrograde filling of the pseudoaneurysm. Electronically Signed   By: Markus Daft M.D.   On: 06/08/2019 18:16   Ir Ivc Filter Plmt / S&i /img Guid/mod Sed  Result Date: 06/08/2019 INDICATION: 53 year old with pulmonary emboli and left lower extremity DVT. Patient has a pelvic pseudoaneurysm that was recently treated and low hemoglobin. Patient is not a candidate for anticoagulation. EXAM: IVC FILTER PLACEMENT; IVC VENOGRAM; ULTRASOUND FOR VASCULAR ACCESS Physician: Stephan Minister. Anselm Pancoast, MD MEDICATIONS: None. ANESTHESIA/SEDATION: Intubated and sedated from the ICU. CONTRAST:  154 Visipaque 320 (combination of filter placement and pelvic angiography) FLUOROSCOPY TIME:  Fluoroscopy Time: 25 minutes, 1,304 mGy (combination of the filter placement and pelvic angiography) COMPLICATIONS: None immediate. PROCEDURE: Informed consent was obtained for an IVC venogram and filter placement.  Ultrasound demonstrated a patent right common femoral vein. Ultrasound images were obtained for documentation. The right groin was prepped and draped in a sterile fashion. Maximal barrier sterile technique was utilized  including caps, mask, sterile gowns, sterile gloves, sterile drape, hand hygiene and skin antiseptic. The skin was anesthetized with 1% lidocaine. A 21 gauge needle was directed into the vein with ultrasound guidance and a micropuncture dilator set was placed. A wire was advanced into the IVC. The filter sheath was advanced over the wire into the IVC. An IVC venogram was performed. Fluoroscopic images were obtained for documentation. A Bard Denali filter was deployed below the lowest renal vein. A follow-up venogram was performed and the vascular sheath was removed with manual compression. FINDINGS: IVC was patent. Bilateral renal veins were identified. 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 The filter was deployed below the left renal vein. Follow-up venogram confirmed placement within the IVC and below the renal veins. IMPRESSION: Successful placement of a retrievable IVC filter. PLAN: This IVC filter is potentially retrievable. The patient will be assessed for filter retrieval by Interventional Radiology in approximately 8-12 weeks. Further recommendations regarding filter retrieval, continued surveillance or declaration of device permanence, will be made at that time. Electronically Signed   By: Markus Daft M.D.   On: 06/08/2019 18:21   Ct Femur Right Wo Contrast  Result Date: 06/08/2019 CLINICAL DATA:  Sepsis. 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 COMPARISON:  CT 12/25/2018 FINDINGS: Bones/Joint/Cartilage Heterogeneous appearance of the visualized distal sacrum and coccyx  suggesting acute on chronic osteomyelitis. Right hip joint is intact. No fracture or dislocation. No destructive bone lesion of the right femur. Joint spaces of the hip and knee are maintained. No knee joint effusion. Ligaments Suboptimally assessed by CT. Muscles and Tendons Normal right lower extremity muscle bulk without atrophy or fatty infiltration. Tendinous structures grossly intact. Soft tissues Along the course of right transgluteal drainage catheter in the right peroneal soft tissues is a fluid and air collection measuring approximately 3.8 x 2.1 cm (series 5, image 48) which appears contiguous with the larger intrapelvic collection. Please see dedicated CT of the abdomen and pelvis for further delineation of the intrapelvic findings IMPRESSION: 1. Heterogeneous appearance of the visualized distal sacrum and coccyx compatible with acute on chronic osteomyelitis. 2. Along the course of right transgluteal drainage catheter is a fluid and air collection measuring approximately 3.8 x 2.1 cm which appears contiguous with the larger intrapelvic collection. 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 3. Please see dedicated CT of the abdomen and pelvis for further delineation of the intrapelvic findings. Electronically Signed   By: Davina Poke M.D.   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 Patient presented to the hospital with tachycardia and altered mental status. Hemoglobin was 2.7. Patient was found to have small pulmonary emboli and the left lower extremity DVT. CT also demonstrated a pseudoaneurysm in the right pelvis adjacent to the large complex pelvic collection. Patient has been on pressors and requires treatment of the pelvic pseudoaneurysm. Patient also needs an IVC filter because he is not a candidate for  anticoagulation at this time. EXAM: PELVIC ARTERIOGRAPHY WITH ANGIOGRAPHY IN THE RIGHT COMMON ILIAC ARTERY, RIGHT INTERNAL ILIAC ARTERY AND RIGHT INTERNAL ILIAC ARTERY BRANCHES. EMBOLIZATION OF RIGHT INTERNAL ILIAC ARTERY BRANCHES ULTRASOUND GUIDANCE FOR VASCULAR ACCESS MEDICATIONS:  None ANESTHESIA/SEDATION: Patient was intubated and already on sedation from the ICU. CONTRAST:  154 mL Visipaque 320 FLUOROSCOPY TIME:  Fluoroscopy Time: 25 minutes, 123456 mGy COMPLICATIONS: None immediate. PROCEDURE: Informed consent was obtained from the patient's son following explanation of the procedure, risks, benefits and alternatives. The patient's son understands, agrees and consents for the procedure. All questions were addressed. A time out was performed prior to the initiation of the procedure. Both groins were prepped and draped in sterile fashion. Ultrasound confirmed a patent left common femoral artery. Ultrasound image was saved for documentation. Skin was anesthetized with 1% lidocaine. Using ultrasound guidance, 21 gauge needle was directed into the left common femoral artery and micropuncture dilator set was placed. Five French vascular sheath was placed. C2 catheter was used to cannulate the right common iliac artery. Stiff Glidewire was advanced into the right internal iliac artery and the C2 catheter was placed in the distal common iliac artery. Angiography was performed. The right internal iliac artery was selected with a renegade STC microcatheter. Multiple branches were selected and until the branch supplying the pseudoaneurysm was cannulated. Vessel supplying the pseudoaneurysm was very irregular and neither wire nor catheter could be advanced distal to the pseudoaneurysm. The proximal aspect of the feeding vessel was embolized with 2 Soft Interlock coils, both measuring 2 mm x 4 cm. Follow-up angiography confirmed occlusion of the feeding artery. The adjacent branches were selected with the Renegade catheter  and additional angiography was performed. An adjacent branch appeared to have some retrograde filling of the pseudoaneurysm. Therefore, this vessel was treated with Gel-Foam slurry embolization. Decreased flow in the vessel following Gel-Foam embolization and no clear evidence for retrograde filling of the pseudoaneurysm following the embolization. Follow-up angiogram was performed through the 5 French catheter. Angiogram through the left groin sheath. Left groin sheath was removed with manual compression. Attempted to use an ExoSeal but the device did not deploy. Hemostasis in left groin following manual compression. FINDINGS: Pseudoaneurysm was identified from a branch of the right internal iliac artery. Branch appears to be coming off the anterior division. The main feeding artery may represent the right internal pudendal artery. The feeding vessel is very irregular. Wire and catheter could not be advanced distal to the origin of the pseudoaneurysm. Once the catheter was within the feeding vessel, contrast was preferentially refluxing which made it difficult to consider particle embolization of the feeding artery. As a result, the feeding branch was treated with 2 soft interlock coils proximal to the pseudoaneurysm. The feeding vessel was successfully embolized. However, the follow-up angiogram demonstrated delayed retrograde filling in the region of the pseudoaneurysm. Adjacent branches were selected and additional angiography was performed. An adjacent irregular branch which may represent the inferior gluteal artery appeared to be supplying the pseudoaneurysm through retrograde flow. This artery was embolized with Gelfoam slurry until there was pruning of the distal vasculature and slow flow within this vessel. Final pelvic angiogram demonstrated no significant filling of the pseudoaneurysm. IMPRESSION: Pseudoaneurysm originating from a branch of the right internal iliac artery anterior division. The feeding  artery was embolized with coils proximal to pseudoaneurysm. Adjacent vessel was treated with a Gel-Foam slurry to decrease the risk of retrograde filling of the pseudoaneurysm. Electronically Signed   By: Markus Daft M.D.   On: 06/08/2019 18:16   Dg Chest Port 1 View  Result Date: 06/07/2019 CLINICAL DATA:  Shortness of breath. Rectal carcinoma. EXAM: PORTABLE CHEST 1 VIEW COMPARISON:  06/19/2016 FINDINGS: Right arm PICC line remains  in appropriate position. Both lungs are clear. Heart size is normal. IMPRESSION: No active disease. Electronically Signed   By: Marlaine Hind M.D.   On: 06/07/2019 14:27   [place] Guide Roadmapping  Result Date: 06/08/2019 INDICATION: 53 year old male with a history of rectal cancer and chronic pelvic abscess/necrotic fluid collection with a drain. Patient presented to the hospital with tachycardia and altered mental status. Hemoglobin was 2.7. Patient was found to have small pulmonary emboli and the left lower extremity DVT. CT also demonstrated a pseudoaneurysm in the right pelvis adjacent to the large complex pelvic collection. Patient has been on pressors and requires treatment of the pelvic pseudoaneurysm. Patient also needs an IVC filter because he is not a candidate for anticoagulation at this time. EXAM: PELVIC ARTERIOGRAPHY WITH ANGIOGRAPHY IN THE RIGHT COMMON ILIAC ARTERY, RIGHT INTERNAL ILIAC ARTERY AND RIGHT INTERNAL ILIAC ARTERY BRANCHES. EMBOLIZATION OF RIGHT INTERNAL ILIAC ARTERY BRANCHES ULTRASOUND GUIDANCE FOR VASCULAR ACCESS MEDICATIONS: None ANESTHESIA/SEDATION: Patient was intubated and already on sedation from the ICU. CONTRAST:  154 mL Visipaque 320 FLUOROSCOPY TIME:  Fluoroscopy Time: 25 minutes, 123456 mGy COMPLICATIONS: None immediate. PROCEDURE: Informed consent was obtained from the patient's son following explanation of the procedure, risks, benefits and alternatives. The patient's son understands, agrees and consents  for the procedure. All questions were addressed. A time out was performed prior to the initiation of the procedure. Both groins were prepped and draped in sterile fashion. Ultrasound confirmed a patent left common femoral artery. Ultrasound image was saved for documentation. Skin was anesthetized with 1% lidocaine. Using ultrasound guidance, 21 gauge needle was directed into the left common femoral artery and micropuncture dilator set was placed. Five French vascular sheath was placed. C2 catheter was used to cannulate the right common iliac artery. Stiff Glidewire was advanced into the right internal iliac artery and the C2 catheter was placed in the distal common iliac artery. Angiography was performed. The right internal iliac artery was selected with a renegade STC microcatheter. Multiple branches were selected and until the branch supplying the pseudoaneurysm was cannulated. Vessel supplying the pseudoaneurysm was very irregular and neither wire nor catheter could be advanced distal to the pseudoaneurysm. The proximal aspect of the feeding vessel was embolized with 2 Soft Interlock coils, both measuring 2 mm x 4 cm. Follow-up angiography confirmed occlusion of the feeding artery. The adjacent branches were selected with the Renegade catheter and additional angiography was performed. An adjacent branch appeared to have some retrograde filling of the pseudoaneurysm. Therefore, this vessel was treated with Gel-Foam slurry embolization. Decreased flow in the vessel following Gel-Foam embolization and no clear evidence for retrograde filling of the pseudoaneurysm following the embolization. Follow-up angiogram was performed through the 5 French catheter. Angiogram through the left groin sheath. Left groin sheath was removed with manual compression. Attempted to use an ExoSeal but the device did not deploy. Hemostasis in left groin following manual compression. FINDINGS: Pseudoaneurysm was identified from a branch of  the right internal iliac artery. Branch appears to be coming off the anterior division. The main feeding artery may represent the right internal pudendal artery. The feeding vessel is very irregular. Wire and catheter could not be advanced distal to the origin of the pseudoaneurysm. Once the catheter was within the feeding vessel, contrast was preferentially refluxing which made it difficult to consider particle embolization of the feeding artery. As a result, the feeding branch was treated with 2 soft interlock coils proximal  to the pseudoaneurysm. The feeding vessel was successfully embolized. However, the follow-up angiogram demonstrated delayed retrograde filling in the region of the pseudoaneurysm. Adjacent branches were selected and additional angiography was performed. An adjacent irregular branch which may represent the inferior gluteal artery appeared to be supplying the pseudoaneurysm through retrograde flow. This artery was embolized with Gelfoam slurry until there was pruning of the distal vasculature and slow flow within this vessel. Final pelvic angiogram demonstrated no significant filling of the pseudoaneurysm. IMPRESSION: Pseudoaneurysm originating from a branch of the right internal iliac artery anterior division. The feeding artery was embolized with coils proximal to pseudoaneurysm. Adjacent vessel was treated with a Gel-Foam slurry to decrease the risk of retrograde filling of the pseudoaneurysm. Electronically Signed   By: Markus Daft M.D.   On: 06/08/2019 18:16    Anti-infectives: Anti-infectives (From admission, onward)   Start     Dose/Rate Route Frequency Ordered Stop   06/08/19 2200  vancomycin (VANCOCIN) 1,250 mg in sodium chloride 0.9 % 250 mL IVPB  Status:  Discontinued     1,250 mg 166.7 mL/hr over 90 Minutes Intravenous Every 24 hours 06/08/19 0729 06/08/19 1347   06/08/19 2100  vancomycin (VANCOCIN) 1,250 mg in sodium chloride 0.9 % 250 mL IVPB  Status:  Discontinued      1,250 mg 166.7 mL/hr over 90 Minutes Intravenous  Once 06/07/19 2103 06/08/19 0729   06/08/19 1600  linezolid (ZYVOX) IVPB 600 mg     600 mg 300 mL/hr over 60 Minutes Intravenous Every 12 hours 06/08/19 1347     06/08/19 1400  vancomycin (VANCOCIN) 50 mg/mL oral solution 500 mg     500 mg Oral Every 6 hours 06/08/19 1348     06/08/19 1400  metroNIDAZOLE (FLAGYL) IVPB 500 mg     500 mg 100 mL/hr over 60 Minutes Intravenous Every 8 hours 06/08/19 1348     06/08/19 1400  meropenem (MERREM) 2 g in sodium chloride 0.9 % 100 mL IVPB     2 g 200 mL/hr over 30 Minutes Intravenous Every 8 hours 06/08/19 1356     06/07/19 2130  piperacillin-tazobactam (ZOSYN) IVPB 3.375 g  Status:  Discontinued     3.375 g 12.5 mL/hr over 240 Minutes Intravenous Every 8 hours 06/07/19 2103 06/08/19 1347   06/07/19 2115  vancomycin (VANCOCIN) 500 mg in sodium chloride 0.9 % 100 mL IVPB     500 mg 100 mL/hr over 60 Minutes Intravenous  Once 06/07/19 2103 06/07/19 2345   06/07/19 1515  vancomycin (VANCOCIN) IVPB 1000 mg/200 mL premix     1,000 mg 200 mL/hr over 60 Minutes Intravenous  Once 06/07/19 1512 06/07/19 1819   06/07/19 1515  piperacillin-tazobactam (ZOSYN) IVPB 3.375 g     3.375 g 100 mL/hr over 30 Minutes Intravenous  Once 06/07/19 1512 06/07/19 1618       Assessment/Plan 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 Crisoforo Oxford at [facility] fistulas controlled by drain, although this looks mostly like thin old blood currently; however, is very malodorous. -plan to likely extubate today.  Patient says he was eating at home prior to admission so suspect he could be started on clears once extubated.  Will discuss with MD in setting of fistulas.   -IR to likely upsize drain once extubated to  get this  working better  FEN - NPO for now on vent VTE - on hold due to anemia ID - Merrem, zyvox, flagyl, vanc   LOS: 2 days    Henreitta Cea , [facility] 06/09/2019, 8:25 AM Please see Amion for pager number during day hours 7:00am-4:30pm

## 2019-06-09 NOTE — Procedures (Signed)
Extubation Procedure Note  Patient Details:   Name: Jonathan Wright DOB: 12/24/1965 MRN: [id number]   Airway Documentation:    Vent end date: 06/09/19 Vent end time: 0841   Evaluation  O2 sats: stable throughout Complications: No apparent complications Patient did tolerate procedure well. Bilateral Breath Sounds: Clear, Diminished   Patient extubated to 3L [place]. Patient met all weaning parameters along with positive cuff leak. Patient able to speak & cough post extubation.  Hopper, Justin David 06/09/2019, 8:46 AM  

## 2019-06-09 NOTE — Progress Notes (Signed)
Initial Nutrition Assessment  DOCUMENTATION CODES:   Not applicable  INTERVENTION:   - Once diet advanced, Ensure Enlive po TID, each supplement provides 350 kcal and 20 grams of protein  - Recommend MVI with minerals daily  NUTRITION DIAGNOSIS:   Increased nutrient needs related to cancer and cancer related treatments as evidenced by estimated needs.  GOAL:   Patient will meet greater than or equal to 90% of their needs  MONITOR:   Diet advancement, Labs, Weight trends, Skin, I & O's  REASON FOR ASSESSMENT:   Consult Assessment of nutrition requirement/status  ASSESSMENT:   53 year old male who presented to the ED on 10/17 with AMS and respiratory distress. PMH recurrent metastatic stage IIIb rectal adenocarcinoma s/p multiple treatments at [facility] (chemotherapy, s/p robotic assisted coloanal anastomosis, s/p diverting ileostomy), s/p pelvic exenteration and longstanding chronic pelvic abscess with fistulization. Work-up revealing severe anemia, sepsis, acute PE, AKI, and severe acidosis. Pt required intubation, pressor support, and PRBC transfusion.   10/18 - s/p iliac pseudoaneurysm embolization and IVC filter placement 10/19 - extubated  Unable to speak with pt at this time due to enteric precautions. Pt remains NPO after extubation.  RD will follow for diet advancement and supplement as appropriate.  Weight up 13 lbs since admit. Suspect weight gain related to positive fluid balance. Pt with deep pitting edema to BLE.  Reviewed weight history in chart. Weight has fluctuated between 69-79 kg over the last 1 year and appears to be trending up.  Medications reviewed and include: IV Zyvox , IV Merrem, IV Flagyl, IV KCl 10 mEq x 6 runs  Drips: NS: 10 ml/hr Protonix: 25 ml/hr  Labs reviewed: potassium 3.2, hemoglobin 8.3 CBG's: 83-109 x 24 hours  UOP: 650 ml x 24 hours I/O's: +8.9 L since admit  NUTRITION - FOCUSED PHYSICAL EXAM:  Unable to complete at  this time.  Diet Order:   Diet Order            Diet NPO time specified  Diet effective now              EDUCATION NEEDS:   No education needs have been identified at this time  Skin:  Skin Assessment: Skin Integrity Issues: Skin Integrity Issues: Other: open wound left buttocks (old JP drain site)  Last BM:  06/09/19 colostomy  Height:   Ht Readings from Last 1 Encounters:  06/07/19 6' (1.829 m)    Weight:   Wt Readings from Last 1 Encounters:  06/09/19 79.9 kg    Ideal Body Weight:  80.9 kg  BMI:  Body mass index is 23.89 kg/m.  Estimated Nutritional Needs:   Kcal:  2200-2400  Protein:  110-130 grams  Fluid:  >/= 2.0 L    Gaynell Face, MS, RD, LDN Inpatient Clinical Dietitian Pager: [phone] Weekend/After Hours: [phone]

## 2019-06-09 NOTE — Progress Notes (Signed)
NAME:  Jonathan Wright, MRN:  [id number], DOB:  [birth year], LOS: 2 ADMISSION DATE:  06/07/2019, CONSULTATION DATE:  06/07/2019 REFERRING MD:  Dr. Sabra Heck, CHIEF COMPLAINT:  Hypotension  Brief History   53 year old male with hx of metastatic rectal cancer presenting with respiratory distress and syncopal episode found to be hypoxic and hypotensive with workup showing patient severely anemic (Hgb 2.7), sepsis with WBC 84.8, acute PE, AKI, severe acidosis.  He remains a full code at this time.  He was intubated and requiring vasopressor support and transfused with 2 units of PRBC.  Transferred from [facility] to [facility] for higher level of care.   History of present illness   HPI obtained from medical chart review as patient is intubated and sedated on mechanical ventilation.    53 year old male with history of stage IIIB rectal adenocarcinoma, mT2N2M0, diagnosed in 08/2015 and underwent 5Gyx 5 and diverting ileostomy at [facility] initially.  He is currently followed by Dr. Delton Coombes (at [facility]) with most recent chemotherapy 04/23/2019 with palliative FOLFIRI and bevacizumab with no reported no bleeding, nausea, vomiting, or bowel changes and poorly controlled pain despite his fentanyl patches and oxycodone at that time. Additionally in September, he had reported foul smelling discharge leaking from the sides of the drainage tubes for 2 weeks. Of note, patient had CT guided drain placement by IR in April and then a left parasacral drain at [facility] in July with ongoing bloody drainage. [facility] on [facility] left buttock was DC in August at sometime. Drain on left buttock from [facility] still in place. Also of note patient with colostomy and urostomy placed in 2019 with his multiple revisions and surgeries. Had flap dissection on right inner thigh in 2019 that continues to drain fluid and seems to be [progressing per the partner.  CT in August demonstrated progression of left pelvic floor fluid collection/ lesion and fistula and  therefore chemo restarted.  He was noted to have a hemoglobin of 9 with his microcytic anemia in September.  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 GOC were approached with patient in September, however he refused hospice care at that time.   He presented from home today with acute decompensation with respiratory distress and witnessed syncopal episode and noted to be extremely pale, hypoxic in the 80's.  In ER, he required fluids and vasopressor support for ongoing hypotension.  Noted to be afebrile.  Labs noted WBC 84.8K, Hgb 2.7, Hct 9.9, platelets 729, Na 128, K 5.7, CO2 <7, glucose 315, sCr 2.27, BUN 36, alk phos 139, t bili 0.1, hs trop 9- 15, LA 5.3.  Coags, UA pending.  CTA PE showed segmental PE in the RLL and LUL with bilateral bibasilar opacities/ atelectasis.  He remained altered in the ER with ongoing shock.  Code status discussed with patient's significant other who wished for ongoing aggressive care as long as there "was hope".  Therefore he was intubated.  Transfused with 2 units of emergency released blood.  He was noted to be severely acidotic with ABG 6.88/ 24.2/ 63.1 and undetectable bicarb. He was placed on bicarb gtt. Reports of rectal bleeding, and blood around colostomy and urostomy sites.  He was transferred to [facility] for higher level of care, PCCM accepted.   Past Medical History  Metastatic Stage IIIb rectal adenocarcinoma, recurrent UTI, microcytic anemia, anxiety, depression  Significant Hospital Events   10/17 Admitted/ tx to [facility] from [facility] 10/18  IR pelvic arteriogram with embolization of right internal iliac artery branch supplying the pseudoaneurysm and and gelfoam embolization of an adjacent branch with retrograde filling of the pseudoaneurysm, IVC filter placement.  Consults:  IR Surgery  Procedures:  10/17 ETT >> 10/18  Significant Diagnostic Tests:  10/17 CTA PE >> Segmental pulmonary emboli within the  RIGHT LOWER lobe and LEFT UPPER lobe. Mild to moderate bilateral dependent and bibasilar opacities/atelectasis. Mild cardiomegaly. 10/17 CTH >> no acute process. 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 10/17 CTA a/p >> pelvic fluid collections now replaced with mottled air, air tracks into the perineum and base of the penis, findings concerning for necrotizing soft tissue infection.  Filling defect in the left common femoral vein consistent with DVT.  The left common iliac vein is decompressed just proximal to the iliac confluence.  Right internal iliac artery pseudoaneurysm measuring 39m in the region of presacral fluid collection.  Improved aeration at the lung bases. 553mLLL nodule.   Micro Data:  10/17 SARS CoV2 >> neg 10/17 MRSA PCR >> 10/17 BCx 2 >> 10/17 UC >> 10/17 fluid cx from drains 10/17 C.Diff > positive  Antimicrobials:  10/17 vanc >> 10/17 zosyn >>  Interim history/subjective:  Embolization 10/18. Weaning well this AM, eager for extubation.  Objective   Blood pressure 117/71, pulse 81, temperature (!) 97.4 F (36.3 C), temperature source Oral, resp. rate 19, height 6' (1.829 m), weight 79.9 kg, SpO2 99 %.    Vent Mode: PRVC FiO2 (%):  [30 %-40 %] 40 % Set Rate:  [14 bmp] 14 bmp Vt Set:  [620 mL] 620 mL PEEP:  [5 cmH20] 5 cmH20 Pressure Support:  [5 cmH20] 5 cmH20 Plateau Pressure:  [9 cmH20-17 cmH20] 9 cmH20   Intake/Output Summary (Last 24 hours) at 06/09/2019 0900 Last data filed at 06/09/2019 0800 Gross per 24 hour  Intake 3072.98 ml  Output 650 ml  Net 2422.98 ml   Filed Weights   06/07/19 1800 06/08/19 0427 06/09/19 0500  Weight: 73.3 kg 73.3 kg 79.9 kg   Physical Exam: General: Adult male, resting in bed, in NAD. Neuro: Awake on mechanical ventilation.  MAE's, no deficits. HEENT: [place]/AT. Sclerae anicteric. ETT in place. Cardiovascular: RRR, no M/R/G.  Lungs: Respirations even and unlabored.  CTA bilaterally, No W/R/R. Abdomen:  Ileostomy in place, urostomy in place.  BS x 4, soft, NT/ND.  Musculoskeletal: No gross deformities, no edema.  Skin: Intact, warm, no rashes.   Assessment & Plan:   [age]ear old male with recurrent rectal cancer s/p chemoradiation complicated by chronic pelvic abscesses with [facility]. Currently only has one right transgluteal drain in place. He was transferred from [facility] for acute blood loss anemia, septic shock and pulmonary embolism. Overnight abdominal imaging revealed air/fluid tracking into perineum concerning for necrotizing soft tissue infection, right internal iliac artery pseudoaneurysm measuring 1469mnd left common femoral vein DVT. Surgery consulted and deemed further intervention may worsen his current wound issues. Discussed case with IR who will evaluate patient for pelvic abscess management and pseudoaneurysm.  Acute blood loss anemia secondary to suspected pseudoaneurysm vs GI bleed in setting of acute left common femoral DVT and segmental pulmonary emboli - S/p IR embolization 10/18. - Appreciate IR assistance - Trend CBC and transfuse Hg goal >7  Septic/Hemorrhagic shock secondary to recurrent pelvic  abscess - Management as above for bleed - Continue empiric antibiotics - Follow-up culture data  Hypokalemia - 6 runs K now - Follow BMP  Acute respiratory insufficiency - s/p intubation 10/17. Tolerating SBT well this AM. - Extubate now - Bronchial hygiene  Pulmonary embolism - s/p IVC filter placement 10/18. - Continue IVC - No anticoagulation given bleed / hemorrhage.  Recurrent rectal adenocarcinoma on palliative chemo c/b chronic pelvic abscesses and fistulization -Patient wishes to remain aggressive with care -Will likely need transfer to tertiary center for definitive care - surgery  following  Best practice:  Diet: Defer to surgery, likely OK to start with clears Pain/Anxiety/Delirium protocol (if indicated): Duragesic patch at 158mg/hr (on 3023m/hr as outpatient) VAP protocol (if indicated): yes DVT prophylaxis: Holding anticoagulation GI prophylaxis: PPI  Glucose control: SSI if glucose consistently > 180 Mobility: BR Code Status: Full  Family Communication: Significant other, ClSabino [phone] to contact spouse by phone. No answer. Left message with callback number. Disposition: ICU  Will observe in ICU through this afternoon to ensure remains stable post extubation.  If so, will transfer out to med surg and ask [facility] to assume care in AM 10/20 with PCCM off at that time.   CC time: 35 min.   RaMontey HoraPABluffviewulmonary & Critical Care Medicine Pager: [phone] If no answer, [phone] - [phone]/19/2020, 9:30 AM

## 2019-06-09 NOTE — Progress Notes (Signed)
VASCULAR LAB PRELIMINARY  PRELIMINARY  PRELIMINARY  PRELIMINARY  Bilateral lower extremity venous duplex completed.    Preliminary report:  See CV proc for preliminary results.     Zaden Sako, RVT 06/09/2019, 11:33 AM

## 2019-06-10 ENCOUNTER — Inpatient Hospital Stay (HOSPITAL_COMMUNITY): Payer: BC Managed Care – PPO

## 2019-06-10 ENCOUNTER — Encounter (HOSPITAL_COMMUNITY): Payer: Self-pay | Admitting: Radiology

## 2019-06-10 DIAGNOSIS — I729 Aneurysm of unspecified site: Secondary | ICD-10-CM

## 2019-06-10 DIAGNOSIS — R092 Respiratory arrest: Secondary | ICD-10-CM

## 2019-06-10 DIAGNOSIS — D649 Anemia, unspecified: Secondary | ICD-10-CM

## 2019-06-10 DIAGNOSIS — C20 Malignant neoplasm of rectum: Secondary | ICD-10-CM

## 2019-06-10 DIAGNOSIS — I2699 Other pulmonary embolism without acute cor pulmonale: Secondary | ICD-10-CM

## 2019-06-10 HISTORY — PX: IR CATHETER TUBE CHANGE: IMG717

## 2019-06-10 LAB — CK
Total CK: 32 U/L — ABNORMAL LOW (ref 49–397)
Total CK: 22 U/L — ABNORMAL LOW (ref 49–397)

## 2019-06-10 LAB — BASIC METABOLIC PANEL
Anion gap: 7 (ref 5–15)
BUN: 17 mg/dL (ref 6–20)
CO2: 20 mmol/L — ABNORMAL LOW (ref 22–32)
Calcium: 7.3 mg/dL — ABNORMAL LOW (ref 8.9–10.3)
Chloride: 108 mmol/L (ref 98–111)
Creatinine, Ser: 1.04 mg/dL (ref 0.61–1.24)
GFR calc Af Amer: >60 mL/min (ref >60)
GFR calc non Af Amer: >60 mL/min (ref >60)
Glucose, Bld: 111 mg/dL — ABNORMAL HIGH (ref 70–99)
Potassium: 3.8 mmol/L (ref 3.5–5.1)
Sodium: 135 mmol/L (ref 135–145)

## 2019-06-10 LAB — CBC
HCT: 26.9 % — ABNORMAL LOW (ref 39.0–52.0)
Hemoglobin: 8.9 g/dL — ABNORMAL LOW (ref 13.0–17.0)
MCH: 30.4 pg (ref 26.0–34.0)
MCHC: 33.1 g/dL (ref 30.0–36.0)
MCV: 91.8 fL (ref 80.0–100.0)
Platelets: 234 10*3/uL (ref 150–400)
RBC: 2.93 MIL/uL — ABNORMAL LOW (ref 4.22–5.81)
RDW: 19.9 % — ABNORMAL HIGH (ref 11.5–15.5)
WBC: 32.9 10*3/uL — ABNORMAL HIGH (ref 4.0–10.5)
nRBC: 0.1 % (ref 0.0–0.2)

## 2019-06-10 LAB — GLUCOSE, CAPILLARY
Glucose-Capillary: 123 mg/dL — ABNORMAL HIGH (ref 70–99)
Glucose-Capillary: 79 mg/dL (ref 70–99)
Glucose-Capillary: 80 mg/dL (ref 70–99)
Glucose-Capillary: 86 mg/dL (ref 70–99)
Glucose-Capillary: 97 mg/dL (ref 70–99)

## 2019-06-10 LAB — MAGNESIUM: Magnesium: 2 mg/dL (ref 1.7–2.4)

## 2019-06-10 LAB — PHOSPHORUS: Phosphorus: 2.1 mg/dL — ABNORMAL LOW (ref 2.5–4.6)

## 2019-06-10 LAB — PROTIME-INR
INR: 1.5 — ABNORMAL HIGH (ref 0.8–1.2)
Prothrombin Time: 17.9 seconds — ABNORMAL HIGH (ref 11.4–15.2)

## 2019-06-10 MED ORDER — OXYCODONE HCL 5 MG PO TABS
20.0000 mg | ORAL_TABLET | ORAL | Status: DC | PRN
  Administered 2019-06-10 – 2019-06-11 (×6): 20 mg via ORAL
  Filled 2019-06-10 (×6): qty 4

## 2019-06-10 MED ORDER — LIDOCAINE HCL (PF) 1 % IJ SOLN
INTRAMUSCULAR | Status: AC | PRN
  Administered 2019-06-10: 5 mL

## 2019-06-10 MED ORDER — IOHEXOL 300 MG/ML  SOLN
50.0000 mL | Freq: Once | INTRAMUSCULAR | Status: AC | PRN
  Administered 2019-06-10: 10 mL

## 2019-06-10 MED ORDER — LACTATED RINGERS IV BOLUS
1000.0000 mL | Freq: Once | INTRAVENOUS | Status: AC
  Administered 2019-06-10: 1000 mL via INTRAVENOUS

## 2019-06-10 MED ORDER — FENTANYL 100 MCG/HR TD PT72
1.0000 | MEDICATED_PATCH | TRANSDERMAL | Status: DC
  Administered 2019-06-10: 1 via TRANSDERMAL
  Filled 2019-06-10: qty 3

## 2019-06-10 MED ORDER — FENTANYL CITRATE (PF) 100 MCG/2ML IJ SOLN
100.0000 ug | Freq: Once | INTRAMUSCULAR | Status: AC
  Administered 2019-06-10: 100 ug via INTRAVENOUS

## 2019-06-10 MED ORDER — FENTANYL CITRATE (PF) 100 MCG/2ML IJ SOLN
25.0000 ug | INTRAMUSCULAR | Status: DC | PRN
  Administered 2019-06-10: 50 ug via INTRAVENOUS
  Filled 2019-06-10: qty 2

## 2019-06-10 MED ORDER — FENTANYL 100 MCG/HR TD PT72
1.0000 | MEDICATED_PATCH | TRANSDERMAL | Status: DC
  Administered 2019-06-10: 1 via TRANSDERMAL

## 2019-06-10 MED ORDER — LIDOCAINE HCL 1 % IJ SOLN
INTRAMUSCULAR | Status: AC
  Filled 2019-06-10: qty 20

## 2019-06-10 MED ORDER — OXYCODONE HCL 5 MG PO TABS
20.0000 mg | ORAL_TABLET | Freq: Four times a day (QID) | ORAL | Status: DC | PRN
  Administered 2019-06-10: 20 mg via ORAL
  Filled 2019-06-10: qty 4

## 2019-06-10 NOTE — Progress Notes (Signed)
[place] Progress Note Patient Name: Jonathan Wright DOB: 1965-10-29 MRN: [id number]   Date of Service  06/10/2019  HPI/Events of Note  Patient c/o increased pain after being cleaned and turned. Patient states that he thinks "something happened internally". He is scheduled to have his chronic pelvic drain changed this morning by IR.   eICU Interventions  Will order: 1. Fentanyl 100 mcg IV now (extra dose). 2. Will ask ground team to evaluate patient at bedside.         Chiyo Fay Eugene 06/10/2019, 3:58 AM

## 2019-06-10 NOTE — Progress Notes (Signed)
Patient scheduled for upsize/exchange of drain today in IR.  Otherwise continue current management.  His diet may be advanced to solid food after his procedure if he is otherwise doing well.  He will need to follow up with [facility] upon discharge.  We will continue to follow.  Henreitta Cea 8:40 AM 06/10/2019

## 2019-06-10 NOTE — Progress Notes (Signed)
PCCM INTERVAL PROGRESS NOTE  Called to bedside to evaluate patient with rectal pain near the site of his flap/fistula . Pain described as sharp stabbing pressure. Alleviated by new position change. Turned patient with nurse and she describes increased drainage from rectum/fistula. Patient describes this happening several times in the past, where he will feel pressure increase, and then release out the back. Never this painful though. Have reviewed surgery note, which describes no further surgical options for him at this time, so will defer calling surgery. Pain is improved after [facility] ordered fentanyl. He is going for drain upsize sometime today. Will attempt to manage his pain and continue to monitor.    Georgann Housekeeper, AGACNP-BC [place] Pager [phone] or [phone]  06/10/2019 4:16 AM

## 2019-06-10 NOTE — Procedures (Signed)
Interventional Radiology Procedure Note  Procedure: Exchange/upsizing of pelvic drain  Complications: None  Estimated Blood Loss: < 10 mL  Findings: 12 Fr drain posterior to sacrum at time of exchange. New 14 Fr drain placed over wire and formed in presacral space with return of bloody fluid. Attached to suction bulb drainage.  Venetia Night. Kathlene Cote, M.D Pager:  [phone]

## 2019-06-10 NOTE — Progress Notes (Addendum)
NAME:  Jonathan Wright, MRN:  [id number], DOB:  12-16-1965, LOS: 3 ADMISSION DATE:  06/07/2019, CONSULTATION DATE:  06/07/2019 REFERRING MD:  Dr. Sabra Heck, CHIEF COMPLAINT:  Hypotension  Brief History   53 year old male with hx of metastatic rectal cancer presenting with respiratory distress and syncopal episode found to be hypoxic and hypotensive with workup showing patient severely anemic (Hgb 2.7), sepsis with WBC 84.8, acute PE, AKI, severe acidosis.  He remains a full code at this time.  He was intubated and requiring vasopressor support and transfused with 2 units of PRBC.  Transferred from [facility] to [facility] for higher level of care.   History of present illness   HPI obtained from medical chart review as patient is intubated and sedated on mechanical ventilation.    53 year old male with history of stage IIIB rectal adenocarcinoma, mT2N2M0, diagnosed in 08/2015 and underwent 5Gyx 5 and diverting ileostomy at [facility] initially.  He is currently followed by Dr. Delton Coombes (at [facility]) with most recent chemotherapy 04/23/2019 with palliative FOLFIRI and bevacizumab with no reported no bleeding, nausea, vomiting, or bowel changes and poorly controlled pain despite his fentanyl patches and oxycodone at that time. Additionally in September, he had reported foul smelling discharge leaking from the sides of the drainage tubes for 2 weeks. Of note, patient had CT guided drain placement by IR in April and then a left parasacral drain at [facility] in July with ongoing bloody drainage. [facility] was DC in August at sometime. Drain on left buttock from [facility] still in place. Also of note patient with colostomy and urostomy placed in 2019 with his multiple revisions and surgeries. Had flap dissection on right inner thigh in 2019 that continues to drain fluid and seems to be [progressing per the partner.  CT in August demonstrated progression of left pelvic floor fluid collection/ lesion and fistula and  therefore chemo restarted.  He was noted to have a hemoglobin of 9 with his microcytic anemia in September.  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 [facility] were approached with patient in September, however he refused hospice care at that time.   He presented from home today with acute decompensation with respiratory distress and witnessed syncopal episode and noted to be extremely pale, hypoxic in the 80's.  In ER, he required fluids and vasopressor support for ongoing hypotension.  Noted to be afebrile.  Labs noted WBC 84.8K, Hgb 2.7, Hct 9.9, platelets 729, Na 128, K 5.7, CO2 <7, glucose 315, sCr 2.27, BUN 36, alk phos 139, t bili 0.1, hs trop 9- 15, LA 5.3.  Coags, UA pending.  CTA PE showed segmental PE in the RLL and LUL with bilateral bibasilar opacities/ atelectasis.  He remained altered in the ER with ongoing shock.  Code status discussed with patient's significant other who wished for ongoing aggressive care as long as there "was hope".  Therefore he was intubated.  Transfused with 2 units of emergency released blood.  He was noted to be severely acidotic with ABG 6.88/ 24.2/ 63.1 and undetectable bicarb. He was placed on bicarb gtt. Reports of rectal bleeding, and blood around colostomy and urostomy sites.  He was transferred to [facility] for higher level of care, PCCM accepted.   Past Medical History  Metastatic Stage IIIb rectal adenocarcinoma, recurrent UTI, microcytic anemia, anxiety, depression  Significant Hospital Events   10/17 Admitted/ tx to [facility] from [facility] 10/18  IR pelvic arteriogram with embolization of right internal iliac artery branch supplying the pseudoaneurysm and and gelfoam embolization of an adjacent branch with retrograde filling of the pseudoaneurysm, IVC filter placement.  Consults:  IR Surgery  Procedures:  10/17 ETT >> 10/18  Significant Diagnostic Tests:  10/17 CTA PE >> Segmental pulmonary emboli within the  RIGHT LOWER lobe and LEFT UPPER lobe. Mild to moderate bilateral dependent and bibasilar opacities/atelectasis. Mild cardiomegaly. 10/17 CTH >> no acute process. 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 10/17 CTA a/p >> pelvic fluid collections now replaced with mottled air, air tracks into the perineum and base of the penis, findings concerning for necrotizing soft tissue infection.  Filling defect in the left common femoral vein consistent with DVT.  The left common iliac vein is decompressed just proximal to the iliac confluence.  Right internal iliac artery pseudoaneurysm measuring 62m in the region of presacral fluid collection.  Improved aeration at the lung bases. 553mLLL nodule.   Micro Data:  10/17 SARS CoV2 >> neg 10/17 MRSA PCR >> 10/17 BCx 2 >> 10/17 UC >> 10/17 fluid cx from drains > klebsiella PNA 10/17 C.Diff > positive  Antimicrobials:  10/17 vanc >> 10/17 zosyn >> 10/18 zyvox >> 10/18 flagyl >>   Interim history/subjective:  Tolerated extubation well.  Had rectal pain overnight after position change, now better but still present.  Objective   Blood pressure (!) 82/50, pulse (!) 25, temperature 98.7 F (37.1 C), temperature source Oral, resp. rate (!) 28, height 6' (1.829 m), weight 79.9 kg, SpO2 (!) 82 %.        Intake/Output Summary (Last 24 hours) at 06/10/2019 0853 Last data filed at 06/10/2019 060539ross per 24 hour  Intake 2025.38 ml  Output 950 ml  Net 1075.38 ml   Filed Weights   06/07/19 1800 06/08/19 0427 06/09/19 0500  Weight: 73.3 kg 73.3 kg 79.9 kg   Physical Exam: General: Adult male, resting in bed, in NAD. Neuro: A&O x 3, no deficits. HEENT: [place]/AT. Sclerae anicteric. Cardiovascular: RRR, no M/R/G.  Lungs: Respirations even and unlabored.  CTA bilaterally, No W/R/R. Abdomen: Ileostomy in place, urostomy in place.  BS x 4, soft, NT/ND.  Musculoskeletal: No gross deformities, no edema.  Skin: Intact, warm, no  rashes.   Assessment & Plan:   [age]ear old male with recurrent rectal cancer s/p chemoradiation complicated by chronic pelvic abscesses with [facility] has also had several drains placed with [facility]. Currently only has one right transgluteal drain in place. He was transferred from [facility] for acute blood loss anemia, septic shock and pulmonary embolism. Overnight abdominal imaging revealed air/fluid tracking into perineum concerning for necrotizing soft tissue infection, right internal iliac artery pseudoaneurysm measuring 1480mnd left common femoral vein DVT. Surgery consulted and deemed further intervention may worsen his current wound issues. Discussed case with IR who will evaluate patient for pelvic abscess management and pseudoaneurysm.  Acute blood loss anemia secondary to suspected pseudoaneurysm vs GI bleed in setting of acute left common femoral DVT and segmental pulmonary emboli - S/p IR embolization 10/18. - Appreciate IR assistance, for upsize/exchange of drain today - Trend CBC and transfuse Hg goal >7  Septic/Hemorrhagic shock secondary to recurrent pelvic abscess - Management as above for bleed - Continue empiric antibiotics - Follow-up culture data  Pulmonary embolism - s/p IVC filter placement 10/18. -  Continue IVC - No anticoagulation given bleed / hemorrhage.  Recurrent rectal adenocarcinoma on palliative chemo c/b chronic pelvic abscesses and fistulization -Patient wishes to remain aggressive with care -Will likely need transfer / follow up at [facility] for definitive care - surgery following  Best practice:  Diet: Clears Pain/Anxiety/Delirium protocol (if indicated): Duragesic patch at 328mg/hr (same as outpatient regimen) VAP protocol (if indicated): yes DVT prophylaxis: Holding anticoagulation GI prophylaxis: PPI  Glucose control: SSI if glucose  consistently > 180 Mobility: BR Code Status: Full  Family Communication: Significant other, CSabino [phone] Attempted to contact spouse by phone. No answer. Disposition:  Transfer to med surge and ask [facility] to assume care in AM 10/21 with PCCM off at that time.   RMontey Hora PPolkPulmonary & Critical Care Medicine Pager: [phone]  If no answer, [phone]/20/2020, 9:11 AM  Attending Note:  53year old male with extensive rectal cancer who presents to PCCM with hypotension and c diff positive.  C/o pain this AM.  On exam, diffusely tender abdomen.  I reviewed CXR myself, no acute disease noted.  Discussed with PCCM-NP.  Septic shock:  - Continue abx  - Off pressors  - Continue drains  Acute respiratory failure:  - Extubated 10/19  - Titrate O2 for sat of 88-92%  - Monitor for airway protection  Abdominal sepsis:  - Drain to be upsized via IR today  - Continue abx  Pain:  - Maintain on fentanyl for now  Cancer:  - Will likely need H/O involvement when more stable.  [facility]  Patient seen and examined, agree with above note.  I dictated the care and orders written for this patient under my direction.  YRush Farmer MBulger

## 2019-06-11 ENCOUNTER — Ambulatory Visit (HOSPITAL_COMMUNITY): Payer: BC Managed Care – PPO | Admitting: Hematology

## 2019-06-11 ENCOUNTER — Ambulatory Visit (HOSPITAL_COMMUNITY): Payer: BC Managed Care – PPO

## 2019-06-11 DIAGNOSIS — K651 Peritoneal abscess: Secondary | ICD-10-CM

## 2019-06-11 LAB — BPAM RBC
Blood Product Expiration Date: 202011052359
Blood Product Expiration Date: 202011052359
Blood Product Expiration Date: 202011062359
Blood Product Expiration Date: 202011072359
Blood Product Expiration Date: 202011072359
ISSUE DATE / TIME: 202010151330
ISSUE DATE / TIME: 202010172218
ISSUE DATE / TIME: 202010181050
ISSUE DATE / TIME: 202010181611
Unit Type and Rh: 6200
Unit Type and Rh: 6200
Unit Type and Rh: 6200
Unit Type and Rh: 6200
Unit Type and Rh: 6200

## 2019-06-11 LAB — GLUCOSE, CAPILLARY
Glucose-Capillary: 94 mg/dL (ref 70–99)
Glucose-Capillary: 119 mg/dL — ABNORMAL HIGH (ref 70–99)
Glucose-Capillary: 80 mg/dL (ref 70–99)
Glucose-Capillary: 96 mg/dL (ref 70–99)
Glucose-Capillary: 105 mg/dL — ABNORMAL HIGH (ref 70–99)
Glucose-Capillary: 94 mg/dL (ref 70–99)

## 2019-06-11 LAB — TYPE AND SCREEN
ABO/RH(D): A POS
Antibody Screen: NEGATIVE
Unit division: 0
Unit division: 0
Unit division: 0
Unit division: 0
Unit division: 0

## 2019-06-11 LAB — AEROBIC/ANAEROBIC CULTURE W GRAM STAIN (SURGICAL/DEEP WOUND)

## 2019-06-11 LAB — PROTIME-INR
INR: 1.4 — ABNORMAL HIGH (ref 0.8–1.2)
Prothrombin Time: 17.1 seconds — ABNORMAL HIGH (ref 11.4–15.2)

## 2019-06-11 LAB — CK
Total CK: 26 U/L — ABNORMAL LOW (ref 49–397)
Total CK: 89 U/L (ref 49–397)

## 2019-06-11 MED ORDER — HYDROMORPHONE HCL 1 MG/ML IJ SOLN
1.0000 mg | INTRAMUSCULAR | Status: DC | PRN
  Administered 2019-06-11 – 2019-06-12 (×6): 2 mg via INTRAVENOUS
  Administered 2019-06-13: 1 mg via INTRAVENOUS
  Administered 2019-06-13 – 2019-06-16 (×9): 2 mg via INTRAVENOUS
  Filled 2019-06-11 (×2): qty 2
  Filled 2019-06-11: qty 1
  Filled 2019-06-11 (×14): qty 2

## 2019-06-11 MED ORDER — OXYCODONE HCL 5 MG PO TABS
30.0000 mg | ORAL_TABLET | ORAL | Status: DC | PRN
  Administered 2019-06-11 (×2): 30 mg via ORAL
  Filled 2019-06-11 (×2): qty 6

## 2019-06-11 MED ORDER — OXYCODONE HCL 5 MG PO TABS
40.0000 mg | ORAL_TABLET | ORAL | Status: DC | PRN
  Administered 2019-06-11 – 2019-06-12 (×5): 40 mg via ORAL
  Filled 2019-06-11 (×5): qty 8

## 2019-06-11 MED ORDER — HYDROMORPHONE HCL 1 MG/ML IJ SOLN
2.0000 mg | Freq: Once | INTRAMUSCULAR | Status: AC
  Administered 2019-06-11: 2 mg via INTRAVENOUS
  Filled 2019-06-11: qty 2

## 2019-06-11 MED ORDER — HYDROMORPHONE HCL 1 MG/ML IJ SOLN
1.0000 mg | INTRAMUSCULAR | Status: DC | PRN
  Administered 2019-06-11 (×2): 1 mg via INTRAVENOUS
  Filled 2019-06-11 (×2): qty 1

## 2019-06-11 MED ORDER — FLUOXETINE HCL 20 MG PO CAPS: 40.0000 mg | ORAL_CAPSULE | Freq: Every day | ORAL | Status: DC

## 2019-06-11 MED ORDER — PANTOPRAZOLE SODIUM 40 MG PO TBEC
40.0000 mg | DELAYED_RELEASE_TABLET | Freq: Two times a day (BID) | ORAL | Status: DC
  Administered 2019-06-11 – 2019-06-17 (×13): 40 mg via ORAL
  Filled 2019-06-11 (×13): qty 1

## 2019-06-11 NOTE — Progress Notes (Signed)
Palliative Medicine RN Note: Patient is asking to create an HCPOA. Placed order per Dr Domingo Cocking for [place] for creation of AD.  Marjie Skiff Daylah Sayavong, RN, BSN, [facility] Palliative Medicine Team 06/11/2019 10:35 AM Office [phone]

## 2019-06-11 NOTE — Consult Note (Signed)
Consultation Note Date: 06/11/2019   Patient Name: Jonathan Wright  DOB: 01-22-1966  MRN: [id number]  Age / Sex: 53 y.o., male  PCP: Deland Pretty, MD Referring Physician: Hosie Poisson, MD  Reason for Consultation: Pain control and Psychosocial/spiritual support  HPI/Patient Profile: 53 y.o. male  with past medical history of depression, metastatic rectal cancer currently followed by Dr. Delton Coombes, chronic pelvic abscess with fistuilization s/p parasacral drain, prior therapy at [facility] and pelvic exenteration at [facility] admitted on 06/07/2019 with sepsis, DVT, subsegmental PE, acute respiratory failure requiring intubation (now extubated).  Palliative care consulted for pain management.   Chart reviewed including personal review of pertinent labs and imaging.  Reviewed MAR and current pain regimen.  Met today with Jonathan Wright.  Discussed his pain at length.  His pain is predominantly in his lower abdomen and near current drains.  Pain is constant with acute worsening.  Some relief from pain medication, but feels that current regimen does not always get rid of pain.  Pain worsens with certain movements or personal care such as bathing.  He reports being largely bedbound now due to pain.  Has been using fentnayl 382mg/hr as well as rescue medication of oxycodone 211mevery 4 hours as needed at home.  SUMMARY OF RECOMMENDATIONS   - Full scope treatment - He would like to create and advance directive naming his significant other, [name], as his HCPOA.  [place]. - Long discussion regarding pain management and use of opioids for cancer related pain.  Discussed use of medication as a tool to increase functional status in uncurable malignancy.  In the last 24 hours, he has been utilizing fentanyl 30089mhr (24 hour oral morphine equivalent  of 600m4m oral morphine) and oxycodone 52mg73m doses (24 hour oral morphine equivalent of 210mg 90mral morphine) for total oral morphine equivalent of roughly 810mg. 29mrescue dosing is often approx 10% of total 24 hour dose, he will benefit from increase in his breakthrough dose of oxycodone.   Begin with increase of 50% to 30mg Q 73murs, but will likely need to increase this further.  Will also plan for second line rescue medication of 1mg of I70milaudid to be given 1 hour after oral medication if oral route is ineffective.  Will check in the evening as I think may need to increase rescue doses further, but will be conservative with increases to start to see how he tolerates. - Major concern will be who will f/u for pain management of d/c.  His is certainly a complicated case that will need aggressive symptom management on discharge.  ? [facility] if their palliative care service may be able to see him as OP. - Prozac currently on hold as taking linezolid.  Consider restart prozac once no longer taking linezolid.  Appreciate pharmacy assistance in monitoring med interactions.  Code Status/Advance Care Planning:  Full code   Symptom Management:   As above  Psycho-social/Spiritual:   Desire for further Chaplaincy support:yes- for creation of advance directive  Additional Recommendations: Caregiving  Support/Resources  Prognosis:   Guarded  Discharge Planning: To Be Determined      Primary Diagnoses: Present on Admission: . Respiratory arrest ([place]) . Sepsis ([place])   I have reviewed the medical record, interviewed the patient and family, and examined the patient. The following aspects are pertinent.  Past Medical History:  Diagnosis Date  . Anxiety   . Depression   . Rectal cancer ([place]) 12/17/2015   Social History   Socioeconomic History  . Marital status: Divorced    Spouse name: Not on file  . Number of children: Not on file  .  Years of education: Not on file  . Highest education level: Not on file  Occupational History  . Not on file  Social Needs  . Financial resource strain: Not on file  . Food insecurity    Worry: Not on file    Inability: Not on file  . Transportation needs    Medical: Not on file    Non-medical: Not on file  Tobacco Use  . Smoking status: Never Smoker  . Smokeless tobacco: Never Used  Substance and Sexual Activity  . Alcohol use: Yes    Comment: occ.   . Drug use: No  . Sexual activity: Not on file  Lifestyle  . Physical activity    Days per week: Not on file    Minutes per session: Not on file  . Stress: Not on file  Relationships  . Social Herbalist on phone: Not on file    Gets together: Not on file    Attends religious service: Not on file    Active member of club or organization: Not on file    Attends meetings of clubs or organizations: Not on file    Relationship status: Not on file  Other Topics Concern  . Not on file  Social History Narrative  . Not on file   Family History  Problem Relation Age of Onset  . Rectal cancer Neg Hx    Scheduled Meds: . sodium chloride   Intravenous Once  . chlorhexidine gluconate (MEDLINE KIT)  15 mL Mouth Rinse BID  . Chlorhexidine Gluconate Cloth  6 each Topical Daily  . fentaNYL  1 patch Transdermal Q72H   And  . fentaNYL  1 patch Transdermal Q72H   And  . fentaNYL  1 patch Transdermal Q72H  . pantoprazole  40 mg Oral BID  . vancomycin  500 mg Oral Q6H   Continuous Infusions: . sodium chloride Stopped (06/11/19 1424)  . linezolid (ZYVOX) IV Stopped (06/11/19 1720)  . meropenem (MERREM) IV Stopped (06/11/19 1454)  . metronidazole Stopped (06/11/19 1524)   PRN Meds:.HYDROmorphone (DILAUDID) injection, oxyCODONE, prochlorperazine Medications Prior to Admission:  Prior to Admission medications   Medication Sig Start Date End Date Taking? Authorizing Provider  ALPRAZolam Duanne Moron) 1 MG tablet Take 0.5 tablets  (0.5 mg total) by mouth 2 (two) times daily as needed for anxiety. 06/04/19  Yes Derek Jack, MD  fentaNYL (DURAGESIC) 100 MCG/HR Place 3 patches onto the skin every 3 (three) days. 05/29/19  Yes Derek Jack, MD  FLUoxetine (PROZAC) 40 MG capsule TAKE 1 CAPSULE(40 MG) BY MOUTH DAILY 04/21/19  Yes Derek Jack, MD  Oxycodone HCl 20 MG TABS Take 1 tablet (20 mg total) by mouth every 3 (three) hours  as needed (severe pain). 06/04/19  Yes Derek Jack, MD  prochlorperazine (COMPAZINE) 10 MG tablet Take 1 tablet (10 mg total) by mouth every 6 (six) hours as needed for nausea or vomiting. 05/21/19  Yes Derek Jack, MD  sulfamethoxazole-trimethoprim (BACTRIM DS) 800-160 MG tablet Take 1 tablet by mouth 2 (two) times daily. 05/21/19  Yes Derek Jack, MD  ondansetron (ZOFRAN ODT) 4 MG disintegrating tablet Take 1 tablet (4 mg total) by mouth every 8 (eight) hours as needed for nausea or vomiting. Patient not taking: Reported on 06/07/2019 03/03/19   Derek Jack, MD   No Known Allergies Review of Systems  Constitutional: Positive for activity change and unexpected weight change.  Gastrointestinal: Positive for abdominal pain.  Musculoskeletal: Positive for back pain.  Neurological: Positive for weakness.  Psychiatric/Behavioral: Positive for dysphoric mood. The patient is nervous/anxious.    Physical Exam General: Alert, awake, in no acute distress. Chronically ill appearing Heart: Regular rate and rhythm. No murmur appreciated. Lungs: Good air movement, clear Abdomen: Soft, ileostomy, urostomy, and pelvic drain noted.  Ext: + edema Skin: Warm and dry Neuro: Grossly intact, nonfocal.  Vital Signs: BP 122/78 (BP Location: Right Arm)   Pulse 95   Temp 99.3 F (37.4 C) (Oral)   Resp 16   Ht 6' (1.829 m)   Wt 79.9 kg   SpO2 97%   BMI 23.89 kg/m  Pain Scale: 0-10   Pain Score: 6    SpO2: SpO2: 97 % O2 Device:SpO2: 97 % O2 Flow  Rate: .O2 Flow Rate (L/min): 3 L/min  IO: Intake/output summary:   Intake/Output Summary (Last 24 hours) at 06/11/2019 2152 Last data filed at 06/11/2019 1900 Gross per 24 hour  Intake 1956.1 ml  Output 1770 ml  Net 186.1 ml    LBM: Last BM Date: 06/10/19 Baseline Weight: Weight: 74 kg Most recent weight: Weight: 79.9 kg     Palliative Assessment/Data:     Time In: 0900 Time Out: 1030 Time Total: 90 Greater than 50%  of this time was spent counseling and coordinating care related to the above assessment and plan.  Signed by: Micheline Rough, MD   Please contact Palliative Medicine Team phone at [phone] for questions and concerns.  For individual provider: See Shea Evans

## 2019-06-11 NOTE — Progress Notes (Signed)
Patient ID: Jonathan Wright, male   DOB: [birth year], 53 y.o.   MRN: [id number]       Subjective: Patient feels ok today.  Seems as if his pain is somewhat controlled currently, but still complains more of a pressure type pain in his pelvis.  Tolerating a solid diet with no issues.  Objective: Vital signs in last 24 hours: Temp:  [97.8 F (36.6 C)-99 F (37.2 C)] 99 F (37.2 C) (10/21 0700) Pulse Rate:  [85-112] 101 (10/21 1000) Resp:  [0-26] 11 (10/21 0700) BP: (82-136)/(51-81) 136/81 (10/21 1000) SpO2:  [94 %-100 %] 100 % (10/21 1000) Last BM Date: 06/10/19  Intake/Output from previous day: 10/20 0701 - 10/21 0700 In: 3102.6 [I.V.:605.6; IV Piggyback:2497] Out: 1545 [Urine:1475; Drains:70] Intake/Output this shift: Total I/O In: 510 [P.O.:480; I.V.:30] Out: -   PE: Abd: soft, minimally tender, urostomy on right side, colostomy on left.  Perc drain present in back with old blood looking output.  Lab Results:  Recent Labs    06/09/19 1023 06/10/19 0419  WBC 33.6* 32.9*  HGB 8.8* 8.9*  HCT 25.9* 26.9*  PLT 193 234   BMET Recent Labs    06/09/19 0406 06/10/19 0419  NA 141 135  K 3.2* 3.8  CL 113* 108  CO2 20* 20*  GLUCOSE 107* 111*  BUN 20 17  CREATININE 1.21 1.04  CALCIUM 7.0* 7.3*   PT/INR Recent Labs    06/10/19 0419 06/11/19 0500  LABPROT 17.9* 17.1*  INR 1.5* 1.4*   CMP     Component Value Date/Time   NA 135 06/10/2019 0419   K 3.8 06/10/2019 0419   CL 108 06/10/2019 0419   CO2 20 (L) 06/10/2019 0419   GLUCOSE 111 (H) 06/10/2019 0419   BUN 17 06/10/2019 0419   CREATININE 1.04 06/10/2019 0419   CALCIUM 7.3 (L) 06/10/2019 0419   PROT 3.8 (L) 06/09/2019 0406   ALBUMIN 1.1 (L) 06/09/2019 0406   AST 18 06/09/2019 0406   ALT 14 06/09/2019 0406   ALKPHOS 105 06/09/2019 0406   BILITOT 0.7 06/09/2019 0406   GFRNONAA >60 06/10/2019 0419   GFRAA >60 06/10/2019 0419   Lipase     Component Value Date/Time   LIPASE 14 06/19/2016 1707        Studies/Results: Ir Catheter Tube Change  Result Date: 06/10/2019 INDICATION: 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 Recent coiling of a bleeding pseudoaneurysm of a branch of the right internal iliac artery. The indwelling presacral abscess drainage catheter requires exchange and up sizing due to leakage fluid around the drain and through the pelvic floor. EXAM: IR CATHETER TUBE CHANGE MEDICATIONS: None ANESTHESIA/SEDATION: None FLUOROSCOPY TIME:  1 minutes and 18 seconds.  35.3 mGy. CONTRAST:  10 mL Omnipaque [id number] COMPLICATIONS: None immediate. PROCEDURE: Informed written consent was obtained from the patient after a thorough discussion of the procedural risks, benefits and alternatives. All questions were addressed. Maximal Sterile Barrier Technique was utilized including caps, mask, sterile gowns, sterile gloves, sterile drape, hand hygiene and skin antiseptic. A timeout was performed prior to the initiation of the procedure. With the patient in a decubitus position, the pre-existing drainage catheter was inspected under fluoroscopy and injected with contrast material. The catheter was then removed. A 5 French catheter was advanced through the tract and into the presacral pelvic space. The catheter was removed over a guidewire. A new 11 French percutaneous drainage catheter was advanced into the presacral space. Catheter position was confirmed by fluoroscopy. The  catheter was attached to a suction bulb. It was secured at the skin with a Prolene retention suture, StatLock device and overlying dressing. FINDINGS: At the time of the procedure, the pelvic drain had retracted posteriorly with the tip located just posterior to the tip of the sacrum. Catheter size was increased from 12 [place] to 1 French and the new catheter positioned deeper in the presacral pelvis. There was immediate return of bloody fluid from the drain. IMPRESSION: Exchange and up sizing of posterior pelvic  drainage catheter. The indwelling 12 French catheter had retracted posterior to the presacral space. A new 14 French drainage catheter was advanced into the presacral space with return of bloody fluid. This drain was attached to a suction bulb. Electronically Signed   By: Aletta Edouard M.D.   On: 06/10/2019 15:46   Vas [place] Lower Extremity Venous (dvt)  Result Date: 06/10/2019  Lower Venous Study Indications: Swelling, and pulmonary embolism.  Risk Factors: Cancer Colorectal cancer. pseudoaneurysm of the iliac artery repaired 10/18. Limitations: Bandages. Comparison Study: No prior study on file Performing Technologist: Sharion Dove RVS  Examination Guidelines: A complete evaluation includes B-mode imaging, spectral Doppler, color Doppler, and power Doppler as needed of all accessible portions of each vessel. Bilateral testing is considered an integral part of a complete examination. Limited examinations for reoccurring indications may be performed as noted.  +---------+---------------+---------+-----------+----------+--------------+  RIGHT     Compressibility Phasicity Spontaneity Properties Thrombus Aging  +---------+---------------+---------+-----------+----------+--------------+  CFV       Full            Yes       Yes                                    +---------+---------------+---------+-----------+----------+--------------+  SFJ       Full                                                             +---------+---------------+---------+-----------+----------+--------------+  FV Prox   Full                                                             +---------+---------------+---------+-----------+----------+--------------+  FV Mid    Full                                                             +---------+---------------+---------+-----------+----------+--------------+  FV Distal Full                                                              +---------+---------------+---------+-----------+----------+--------------+  PFV       Full                                                             +---------+---------------+---------+-----------+----------+--------------+  POP       Full            Yes       Yes                                    +---------+---------------+---------+-----------+----------+--------------+  PTV       Full                                                             +---------+---------------+---------+-----------+----------+--------------+  PERO      Full                                                             +---------+---------------+---------+-----------+----------+--------------+   +---------+---------------+---------+-----------+----------+--------------+  LEFT      Compressibility Phasicity Spontaneity Properties Thrombus Aging  +---------+---------------+---------+-----------+----------+--------------+  CFV       None                                             Acute           +---------+---------------+---------+-----------+----------+--------------+  SFJ       Full            Yes       Yes                                    +---------+---------------+---------+-----------+----------+--------------+  FV Prox   None                                             Acute           +---------+---------------+---------+-----------+----------+--------------+  FV Mid    None                                             Acute           +---------+---------------+---------+-----------+----------+--------------+  FV Distal None                                             Acute           +---------+---------------+---------+-----------+----------+--------------+  PFV       None                                             Acute           +---------+---------------+---------+-----------+----------+--------------+  POP       None                                             Acute            +---------+---------------+---------+-----------+----------+--------------+  PTV       None                                             Acute           +---------+---------------+---------+-----------+----------+--------------+  PERO      None                                             Acute           +---------+---------------+---------+-----------+----------+--------------+     Summary: Right: There is no evidence of deep vein thrombosis in the lower extremity. Left: Findings consistent with acute deep vein thrombosis involving the left common femoral vein, left femoral vein, left proximal profunda vein, left popliteal vein, left posterior tibial veins, and left peroneal veins. Unable to image EIV and CIV secondary to abdmonial bandaging and recent surgeries  *See table(s) above for measurements and observations. Electronically signed by Ruta Hinds MD on 06/10/2019 at 11:21:51 AM.    Final     Anti-infectives: Anti-infectives (From admission, onward)   Start     Dose/Rate Route Frequency Ordered Stop   06/08/19 2200  vancomycin (VANCOCIN) 1,250 mg in sodium chloride 0.9 % 250 mL IVPB  Status:  Discontinued     1,250 mg 166.7 mL/hr over 90 Minutes Intravenous Every 24 hours 06/08/19 0729 06/08/19 1347   06/08/19 2100  vancomycin (VANCOCIN) 1,250 mg in sodium chloride 0.9 % 250 mL IVPB  Status:  Discontinued     1,250 mg 166.7 mL/hr over 90 Minutes Intravenous  Once 06/07/19 2103 06/08/19 0729   06/08/19 1600  linezolid (ZYVOX) IVPB 600 mg     600 mg 300 mL/hr over 60 Minutes Intravenous Every 12 hours 06/08/19 1347     06/08/19 1400  vancomycin (VANCOCIN) 50 mg/mL oral solution 500 mg     500 mg Oral Every 6 hours 06/08/19 1348     06/08/19 1400  metroNIDAZOLE (FLAGYL) IVPB 500 mg     500 mg 100 mL/hr over 60 Minutes Intravenous Every 8 hours 06/08/19 1348     06/08/19 1400  meropenem (MERREM) 2 g in sodium chloride 0.9 % 100 mL IVPB     2 g 200 mL/hr over 30 Minutes Intravenous Every 8  hours 06/08/19 1356     06/07/19 2130  piperacillin-tazobactam (ZOSYN) IVPB 3.375 g  Status:  Discontinued     3.375 g 12.5 mL/hr over 240 Minutes Intravenous Every 8 hours 06/07/19 2103 06/08/19 1347   06/07/19 2115  vancomycin (VANCOCIN) 500 mg in sodium chloride 0.9 % 100 mL IVPB     500 mg 100 mL/hr over 60 Minutes Intravenous  Once 06/07/19 2103 06/07/19 2345   06/07/19 1515  vancomycin (VANCOCIN) IVPB 1000 mg/200 mL premix     1,000 mg 200 mL/hr over 60 Minutes Intravenous  Once 06/07/19 1512 06/07/19 1819  06/07/19 1515  piperacillin-tazobactam (ZOSYN) IVPB 3.375 g     3.375 g 100 mL/hr over 30 Minutes Intravenous  Once 06/07/19 1512 06/07/19 1618       Assessment/Plan 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 Crisoforo Oxford at [facility] fistulas controlled by drain, which has been upsized yesterday -tolerating a regular diet -no further surgical needs as he is surgically stable.  Outpatient follow up at [facility] -defer abx therapy to medical team -we will sign off at this time  FEN - regular diet VTE - on hold due to anemia ID - Merrem, zyvox, flagyl, vanc   LOS: 4 days    Henreitta Cea , [facility] Surgery 06/11/2019, 10:44 AM Please see Amion for pager number during day hours 7:00am-4:30pm

## 2019-06-11 NOTE — Progress Notes (Signed)
Supervising Physician: Jacqulynn Cadet  Patient Status:  [facility] - In-pt  Chief Complaint: Pelvic fluid collection  Subjective: Alert, significant other at bedside.  States his pain is under better control than it was at home.  Still having significant drainage.   Allergies: Patient has no known allergies.  Medications: Prior to Admission medications   Medication Sig Start Date End Date Taking? Authorizing Provider  ALPRAZolam Duanne Moron) 1 MG tablet Take 0.5 tablets (0.5 mg total) by mouth 2 (two) times daily as needed for anxiety. 06/04/19  Yes Derek Jack, MD  fentaNYL (DURAGESIC) 100 MCG/HR Place 3 patches onto the skin every 3 (three) days. 05/29/19  Yes Derek Jack, MD  FLUoxetine (PROZAC) 40 MG capsule TAKE 1 CAPSULE(40 MG) BY MOUTH DAILY 04/21/19  Yes Derek Jack, MD  Oxycodone HCl 20 MG TABS Take 1 tablet (20 mg total) by mouth every 3 (three) hours as needed (severe pain). 06/04/19  Yes Derek Jack, MD  prochlorperazine (COMPAZINE) 10 MG tablet Take 1 tablet (10 mg total) by mouth every 6 (six) hours as needed for nausea or vomiting. 05/21/19  Yes Derek Jack, MD  sulfamethoxazole-trimethoprim (BACTRIM DS) 800-160 MG tablet Take 1 tablet by mouth 2 (two) times daily. 05/21/19  Yes Derek Jack, MD  ondansetron (ZOFRAN ODT) 4 MG disintegrating tablet Take 1 tablet (4 mg total) by mouth every 8 (eight) hours as needed for nausea or vomiting. Patient not taking: Reported on 06/07/2019 03/03/19   Derek Jack, MD     Vital Signs: BP (!) 112/98 (BP Location: Right Leg)    Pulse 95    Temp 98 F (36.7 C) (Oral)    Resp 11    Ht 6' (1.829 m)    Wt 176 lb 2.4 oz (79.9 kg)    SpO2 100%    BMI 23.89 kg/m   Physical Exam  NAD, alert, resting in bed.  Abdomen:  Soft, non-distended. TG/pelvic drain in place with dark bloody output.   Imaging: Ct Head Wo Contrast  Result Date: 06/08/2019 CLINICAL DATA:  Encephalopathy.  EXAM: CT HEAD WITHOUT CONTRAST TECHNIQUE: Contiguous axial images were obtained from the base of the skull through the vertex without intravenous contrast. COMPARISON:  None. FINDINGS: Brain: Prominent CSF density structure in the right posterior fossa is likely an arachnoid cyst, less likely mega cisterna magna. No intracranial hemorrhage, mass effect, or midline shift. No hydrocephalus. The basilar cisterns are patent. No evidence of territorial infarct or acute ischemia. No extra-axial or intracranial fluid collection. Vascular: No hyperdense vessel or unexpected calcification. Skull: No fracture or focal lesion. Sinuses/Orbits: Slight mucosal thickening of ethmoid air cells. No sinus fluid levels. Right mastoid air cells slightly hypoplastic. Visualized orbits are unremarkable. Other: None. IMPRESSION: No acute intracranial abnormality. 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 Electronically Signed   By: Keith Rake M.D.   On: 06/08/2019 00:48   Ct Abdomen Pelvis W Contrast  Result Date: 06/08/2019 CLINICAL DATA:  Metastatic rectal cancer, sepsis. Syncope. In the media. EXAM: CT ABDOMEN AND PELVIS WITH CONTRAST TECHNIQUE: Multidetector CT imaging of the abdomen and pelvis was performed using the standard protocol following bolus administration of intravenous contrast. Performed in conjunction with CT of the right femur, reported separately. CONTRAST:  9mL OMNIPAQUE IOHEXOL 300 MG/ML  SOLN COMPARISON:  Most recent abdominal CT 03/31/2019. Chest CTA earlier this day. FINDINGS: Lower chest: Dependent basilar opacities have improved from chest CT yesterday. Residual streaky opacities in the left greater than right lower lobe.  5 mm left lower lobe pulmonary nodule series 8, image 14. Enteric tube in place. Hepatobiliary: Hepatic steatosis. No focal lesion. Distended gallbladder without calcified gallstone. No biliary dilatation. No pericholecystic inflammation. Pancreas: No ductal dilatation  or inflammation. Spleen: Normal in size without focal abnormality. Adrenals/Urinary Tract: No adrenal nodule. Chronic left renal atrophy. There is excreted IV contrast within both renal collecting systems from prior contrast administration. Prominence of both ureters without frank hydronephrosis. No significant perinephric edema. Urostomy with ileal diversion, slight left ureteral prominence just proximal to the ureteric anastomosis. Cystectomy. Stomach/Bowel: Enteric tube within the stomach, no gastric wall thickening. No small bowel obstruction. Pelvic small bowel loops intermittently related with chronic pelvic fluid collection. Appendix not clearly visualized. Moderate colonic stool burden. Transverse colonic redundancy. Descending colostomy without peristomal hernia. Prior APR. Vascular/Lymphatic: Filling defect within the left common femoral vein consistent DVT. The left common iliac vein is decompressed just proximal to the iliac confluence. Retroaortic left renal vein. Portal vein is patent. Abdominal aorta is normal in caliber. Rounded enhancing focus in the right presacral space measuring 14 mm is contiguous with the right internal iliac artery in suspicious for pseudoaneurysm, series 7, image 81. No enlarged abdominal lymph nodes. Few prominent bilateral inguinal nodes. Reproductive: Surgically absent prostate gland. Other: The previous left pelvic drainage catheter has been removed. The right transgluteal pelvic drainage catheter remains in place. Pelvic fluid collections now replaced with mottled air, that tracks into the perineum, left posterior upper thigh, and base of the penis. Difficult to delineate pelvic bowel loops from this mild pelvic fluid collection. No upper abdominal free fluid. Musculoskeletal: Heterogeneous appearance of the sacrum and coccyx with mixed density, this may represent radiation necrosis, however infiltrating lesion or osteomyelitis. Stable sclerotic lesion in the right iliac  bone. Critical Value/emergent results were called by telephone at the time of interpretation on 06/08/2019 at 3:06 am to Dr summer, who verbally acknowledged these results. IMPRESSION: 1. Pelvic fluid collections now replaced with mottled air, air tracks into the perineum and base of the penis. Findings concerning for necrotizing soft tissue infection. Right-sided drainage catheter remains in place. 2. Filling defect in the left common femoral vein consistent with DVT. The left common iliac vein is decompressed just proximal to the iliac confluence. 3. 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 Recommend vascular surgery or interventional radiology consultation given history of anemia. 4. Improved aeration at the lung bases compared to CT yesterday. 5 mm left lower lobe pulmonary nodule. Recommend attention on follow-up. 5. Heterogeneous appearance of the sacrum which may represent post radiation change, however metastatic lesion or osteomyelitis also considered. 6. Hepatic steatosis. Electronically Signed   By: Keith Rake M.D.   On: 06/08/2019 03:06   Ir Angiogram Pelvis Selective Or Supraselective  Result Date: 06/09/2019 INDICATION: 53 year old with pulmonary emboli and left lower extremity DVT. Patient has a pelvic pseudoaneurysm that was recently treated and low hemoglobin. Patient is not a candidate for anticoagulation.  EXAM: IVC FILTER PLACEMENT; IVC VENOGRAM; ULTRASOUND FOR VASCULAR ACCESS  Physician: Stephan Minister. Anselm Pancoast, MD  MEDICATIONS: None.  ANESTHESIA/SEDATION: Intubated and sedated from the ICU.  CONTRAST:  154 Visipaque 320 (combination of filter placement and pelvic angiography)  FLUOROSCOPY TIME:  Fluoroscopy Time: 25 minutes, 1,304 mGy (combination of the filter placement and pelvic angiography)  COMPLICATIONS: None immediate.  PROCEDURE: Informed consent was obtained for an IVC venogram and filter placement. Ultrasound  demonstrated a patent right common femoral vein.  Ultrasound images were obtained for documentation. The right groin was prepped and draped in a sterile fashion. Maximal barrier sterile technique was utilized including caps, mask, sterile gowns, sterile gloves, sterile drape, hand hygiene and skin antiseptic. The skin was anesthetized with 1% lidocaine. A 21 gauge needle was directed into the vein with ultrasound guidance and a micropuncture dilator set was placed. A wire was advanced into the IVC. The filter sheath was advanced over the wire into the IVC. An IVC venogram was performed. Fluoroscopic images were obtained for documentation. A Bard Denali filter was deployed below the lowest renal vein. A follow-up venogram was performed and the vascular sheath was removed with manual compression.  FINDINGS: IVC was patent. Bilateral renal veins were identified. 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 The filter was deployed below the left renal vein. Follow-up venogram confirmed placement within the IVC and below the renal veins.  IMPRESSION: Successful placement of a retrievable IVC filter.  PLAN: This IVC filter is potentially retrievable. The patient will be assessed for filter retrieval by Interventional Radiology in approximately 8-12 weeks. Further recommendations regarding filter retrieval, continued surveillance or declaration of device permanence, will be made at that time.   Electronically Signed   By: Markus Daft M.D.   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 Patient presented to the hospital with tachycardia and altered mental status. Hemoglobin was 2.7. Patient was found to have small pulmonary emboli and the left lower extremity DVT. CT also demonstrated a pseudoaneurysm in the  right pelvis adjacent to the large complex pelvic collection. Patient has been on pressors and requires treatment of the pelvic pseudoaneurysm. Patient also needs an IVC filter because he is not a candidate for anticoagulation at this time. EXAM: PELVIC ARTERIOGRAPHY WITH ANGIOGRAPHY IN THE RIGHT COMMON ILIAC ARTERY, RIGHT INTERNAL ILIAC ARTERY AND RIGHT INTERNAL ILIAC ARTERY BRANCHES. EMBOLIZATION OF RIGHT INTERNAL ILIAC ARTERY BRANCHES ULTRASOUND GUIDANCE FOR VASCULAR ACCESS MEDICATIONS: None ANESTHESIA/SEDATION: Patient was intubated and already on sedation from the ICU. CONTRAST:  154 mL Visipaque 320 FLUOROSCOPY TIME:  Fluoroscopy Time: 25 minutes, 123456 mGy COMPLICATIONS: None immediate. PROCEDURE: Informed consent was obtained from the patient's son following explanation of the procedure, risks, benefits and alternatives. The patient's son understands, agrees and consents for the procedure. All questions were addressed. A time out was performed prior to the initiation of the procedure. Both groins were prepped and draped in sterile fashion. Ultrasound confirmed a patent left common femoral artery. Ultrasound image was saved for documentation. Skin was anesthetized with 1% lidocaine. Using ultrasound guidance, 21 gauge needle was directed into the left common femoral artery and micropuncture dilator set was placed. Five French vascular sheath was placed. C2 catheter was used to cannulate the right common iliac artery. Stiff Glidewire was advanced into the right internal iliac artery and the C2 catheter was placed in the distal common iliac artery. Angiography was performed. The right internal iliac artery was selected with a renegade STC microcatheter. Multiple branches were selected and until the branch supplying the pseudoaneurysm was cannulated. Vessel supplying the pseudoaneurysm was very irregular and neither wire nor catheter could be advanced distal to the pseudoaneurysm. The proximal aspect of the  feeding vessel was embolized with 2 Soft Interlock coils, both measuring 2 mm x 4 cm. Follow-up angiography confirmed occlusion  of the feeding artery. The adjacent branches were selected with the Renegade catheter and additional angiography was performed. An adjacent branch appeared to have some retrograde filling of the pseudoaneurysm. Therefore, this vessel was treated with Gel-Foam slurry embolization. Decreased flow in the vessel following Gel-Foam embolization and no clear evidence for retrograde filling of the pseudoaneurysm following the embolization. Follow-up angiogram was performed through the 5 French catheter. Angiogram through the left groin sheath. Left groin sheath was removed with manual compression. Attempted to use an ExoSeal but the device did not deploy. Hemostasis in left groin following manual compression. FINDINGS: Pseudoaneurysm was identified from a branch of the right internal iliac artery. Branch appears to be coming off the anterior division. The main feeding artery may represent the right internal pudendal artery. The feeding vessel is very irregular. Wire and catheter could not be advanced distal to the origin of the pseudoaneurysm. Once the catheter was within the feeding vessel, contrast was preferentially refluxing which made it difficult to consider particle embolization of the feeding artery. As a result, the feeding branch was treated with 2 soft interlock coils proximal to the pseudoaneurysm. The feeding vessel was successfully embolized. However, the follow-up angiogram demonstrated delayed retrograde filling in the region of the pseudoaneurysm. Adjacent branches were selected and additional angiography was performed. An adjacent irregular branch which may represent the inferior gluteal artery appeared to be supplying the pseudoaneurysm through retrograde flow. This artery was embolized with Gelfoam slurry until there was pruning of the distal vasculature and slow flow within  this vessel. Final pelvic angiogram demonstrated no significant filling of the pseudoaneurysm. IMPRESSION: Pseudoaneurysm originating from a branch of the right internal iliac artery anterior division. The feeding artery was embolized with coils proximal to pseudoaneurysm. Adjacent vessel was treated with a Gel-Foam slurry to decrease the risk of retrograde filling of the pseudoaneurysm. Electronically Signed   By: Markus Daft M.D.   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 Patient presented to the hospital with tachycardia and altered mental status. Hemoglobin was 2.7. Patient was found to have small pulmonary emboli and the left lower extremity DVT. CT also demonstrated a pseudoaneurysm in the right pelvis adjacent to the large complex pelvic collection. Patient has been on pressors and requires treatment of the pelvic pseudoaneurysm. Patient also needs an IVC filter because he is not a candidate for anticoagulation at this time. EXAM: PELVIC ARTERIOGRAPHY WITH ANGIOGRAPHY IN THE RIGHT COMMON ILIAC ARTERY, RIGHT INTERNAL ILIAC ARTERY AND RIGHT INTERNAL ILIAC ARTERY BRANCHES. EMBOLIZATION OF RIGHT INTERNAL ILIAC ARTERY BRANCHES ULTRASOUND GUIDANCE FOR VASCULAR ACCESS MEDICATIONS: None ANESTHESIA/SEDATION: Patient was intubated and already on sedation from the ICU. CONTRAST:  154 mL Visipaque 320 FLUOROSCOPY TIME:  Fluoroscopy Time: 25 minutes, 123456 mGy COMPLICATIONS: None immediate. PROCEDURE: Informed consent was obtained from the patient's son following explanation of the procedure, risks, benefits and alternatives. The patient's son understands, agrees and consents for the procedure. All questions were addressed. A time out was performed prior to the initiation of the procedure. Both groins were prepped and draped in sterile  fashion. Ultrasound confirmed a patent left common femoral artery. Ultrasound image was saved for documentation. Skin was anesthetized with 1% lidocaine. Using ultrasound guidance, 21 gauge needle was directed into the left common femoral artery and micropuncture dilator set was placed. Five French vascular sheath was placed. C2  catheter was used to cannulate the right common iliac artery. Stiff Glidewire was advanced into the right internal iliac artery and the C2 catheter was placed in the distal common iliac artery. Angiography was performed. The right internal iliac artery was selected with a renegade STC microcatheter. Multiple branches were selected and until the branch supplying the pseudoaneurysm was cannulated. Vessel supplying the pseudoaneurysm was very irregular and neither wire nor catheter could be advanced distal to the pseudoaneurysm. The proximal aspect of the feeding vessel was embolized with 2 Soft Interlock coils, both measuring 2 mm x 4 cm. Follow-up angiography confirmed occlusion of the feeding artery. The adjacent branches were selected with the Renegade catheter and additional angiography was performed. An adjacent branch appeared to have some retrograde filling of the pseudoaneurysm. Therefore, this vessel was treated with Gel-Foam slurry embolization. Decreased flow in the vessel following Gel-Foam embolization and no clear evidence for retrograde filling of the pseudoaneurysm following the embolization. Follow-up angiogram was performed through the 5 French catheter. Angiogram through the left groin sheath. Left groin sheath was removed with manual compression. Attempted to use an ExoSeal but the device did not deploy. Hemostasis in left groin following manual compression. FINDINGS: Pseudoaneurysm was identified from a branch of the right internal iliac artery. Branch appears to be coming off the anterior division. The main feeding artery may represent the right internal pudendal artery.  The feeding vessel is very irregular. Wire and catheter could not be advanced distal to the origin of the pseudoaneurysm. Once the catheter was within the feeding vessel, contrast was preferentially refluxing which made it difficult to consider particle embolization of the feeding artery. As a result, the feeding branch was treated with 2 soft interlock coils proximal to the pseudoaneurysm. The feeding vessel was successfully embolized. However, the follow-up angiogram demonstrated delayed retrograde filling in the region of the pseudoaneurysm. Adjacent branches were selected and additional angiography was performed. An adjacent irregular branch which may represent the inferior gluteal artery appeared to be supplying the pseudoaneurysm through retrograde flow. This artery was embolized with Gelfoam slurry until there was pruning of the distal vasculature and slow flow within this vessel. Final pelvic angiogram demonstrated no significant filling of the pseudoaneurysm. IMPRESSION: Pseudoaneurysm originating from a branch of the right internal iliac artery anterior division. The feeding artery was embolized with coils proximal to pseudoaneurysm. Adjacent vessel was treated with a Gel-Foam slurry to decrease the risk of retrograde filling of the pseudoaneurysm. Electronically Signed   By: Markus Daft M.D.   On: 06/08/2019 18:16   Ir Ivc Filter Plmt / S&i /img Guid/mod Sed  Result Date: 06/08/2019 INDICATION: 53 year old with pulmonary emboli and left lower extremity DVT. Patient has a pelvic pseudoaneurysm that was recently treated and low hemoglobin. Patient is not a candidate for anticoagulation. EXAM: IVC FILTER PLACEMENT; IVC VENOGRAM; ULTRASOUND FOR VASCULAR ACCESS Physician: Stephan Minister. Anselm Pancoast, MD MEDICATIONS: None. ANESTHESIA/SEDATION: Intubated and sedated from the ICU. CONTRAST:  154 Visipaque 320 (combination of filter placement and pelvic angiography) FLUOROSCOPY TIME:  Fluoroscopy Time: 25 minutes, 1,304 mGy  (combination of the filter placement and pelvic angiography) COMPLICATIONS: None immediate. PROCEDURE: Informed consent was obtained for an IVC venogram and filter placement. Ultrasound demonstrated a patent right common femoral vein. Ultrasound images were obtained for documentation. The right groin was prepped and draped in a sterile fashion. Maximal barrier sterile technique was utilized including caps, mask, sterile gowns, sterile gloves, sterile drape, hand hygiene and skin antiseptic. The skin was anesthetized with 1%  lidocaine. A 21 gauge needle was directed into the vein with ultrasound guidance and a micropuncture dilator set was placed. A wire was advanced into the IVC. The filter sheath was advanced over the wire into the IVC. An IVC venogram was performed. Fluoroscopic images were obtained for documentation. A Bard Denali filter was deployed below the lowest renal vein. A follow-up venogram was performed and the vascular sheath was removed with manual compression. FINDINGS: IVC was patent. Bilateral renal veins were identified. 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 The filter was deployed below the left renal vein. Follow-up venogram confirmed placement within the IVC and below the renal veins. IMPRESSION: Successful placement of a retrievable IVC filter. PLAN: This IVC filter is potentially retrievable. The patient will be assessed for filter retrieval by Interventional Radiology in approximately 8-12 weeks. Further recommendations regarding filter retrieval, continued surveillance or declaration of device permanence, will be made at that time. Electronically Signed   By: Markus Daft M.D.   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 Recent coiling of a bleeding pseudoaneurysm of a branch of the right internal iliac artery. The indwelling presacral abscess drainage  catheter requires exchange and up sizing due to leakage fluid around the drain and through the pelvic floor. EXAM: IR CATHETER TUBE CHANGE MEDICATIONS: None ANESTHESIA/SEDATION: None FLUOROSCOPY TIME:  1 minutes and 18 seconds.  35.3 mGy. CONTRAST:  10 mL Omnipaque [id number] COMPLICATIONS: None immediate. PROCEDURE: Informed written consent was obtained from the patient after a thorough discussion of the procedural risks, benefits and alternatives. All questions were addressed. Maximal Sterile Barrier Technique was utilized including caps, mask, sterile gowns, sterile gloves, sterile drape, hand hygiene and skin antiseptic. A timeout was performed prior to the initiation of the procedure. With the patient in a decubitus position, the pre-existing drainage catheter was inspected under fluoroscopy and injected with contrast material. The catheter was then removed. A 5 French catheter was advanced through the tract and into the presacral pelvic space. The catheter was removed over a guidewire. A new 50 French percutaneous drainage catheter was advanced into the presacral space. Catheter position was confirmed by fluoroscopy. The catheter was attached to a suction bulb. It was secured at the skin with a Prolene retention suture, StatLock device and overlying dressing. FINDINGS: At the time of the procedure, the pelvic drain had retracted posteriorly with the tip located just posterior to the tip of the sacrum. Catheter size was increased from 12 [place] to 31 French and the new catheter positioned deeper in the presacral pelvis. There was immediate return of bloody fluid from the drain. IMPRESSION: Exchange and up sizing of posterior pelvic drainage catheter. The indwelling 12 French catheter had retracted posterior to the presacral space. A new 14 French drainage catheter was advanced into the presacral space with return of bloody fluid. This drain was attached to a suction bulb. Electronically Signed   By: Aletta Edouard  M.D.   On: 06/10/2019 15:46   Ct Femur Right Wo Contrast  Result Date: 06/08/2019 CLINICAL DATA:  Sepsis. 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 COMPARISON:  CT 12/25/2018 FINDINGS: Bones/Joint/Cartilage Heterogeneous appearance of the visualized distal sacrum and coccyx suggesting acute on chronic osteomyelitis. Right hip joint is  intact. No fracture or dislocation. No destructive bone lesion of the right femur. Joint spaces of the hip and knee are maintained. No knee joint effusion. Ligaments Suboptimally assessed by CT. Muscles and Tendons Normal right lower extremity muscle bulk without atrophy or fatty infiltration. Tendinous structures grossly intact. Soft tissues Along the course of right transgluteal drainage catheter in the right peroneal soft tissues is a fluid and air collection measuring approximately 3.8 x 2.1 cm (series 5, image 48) which appears contiguous with the larger intrapelvic collection. Please see dedicated CT of the abdomen and pelvis for further delineation of the intrapelvic findings IMPRESSION: 1. Heterogeneous appearance of the visualized distal sacrum and coccyx compatible with acute on chronic osteomyelitis. 2. Along the course of right transgluteal drainage catheter is a fluid and air collection measuring approximately 3.8 x 2.1 cm which appears contiguous with the larger intrapelvic collection. 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 3. Please see dedicated CT of the abdomen and pelvis for further delineation of the intrapelvic findings. Electronically Signed   By: Davina Poke M.D.   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 Patient presented to the hospital with tachycardia and altered mental status. Hemoglobin was 2.7. Patient was found to have small pulmonary emboli and the left lower extremity DVT. CT also demonstrated a pseudoaneurysm in the right pelvis adjacent to the large complex pelvic collection. Patient has been on pressors and requires treatment of the pelvic pseudoaneurysm. Patient also needs an IVC filter because he is not a candidate for anticoagulation at this time. EXAM: PELVIC ARTERIOGRAPHY WITH ANGIOGRAPHY IN THE RIGHT COMMON ILIAC ARTERY, RIGHT INTERNAL ILIAC ARTERY AND RIGHT INTERNAL ILIAC ARTERY BRANCHES. EMBOLIZATION OF RIGHT INTERNAL ILIAC ARTERY BRANCHES ULTRASOUND GUIDANCE FOR VASCULAR ACCESS MEDICATIONS: None ANESTHESIA/SEDATION: Patient was intubated and already on sedation from the ICU. CONTRAST:  154 mL Visipaque 320 FLUOROSCOPY TIME:  Fluoroscopy Time: 25 minutes, 123456 mGy COMPLICATIONS: None immediate. PROCEDURE: Informed consent was obtained from the patient's son following explanation of the procedure, risks, benefits and alternatives. The patient's son understands, agrees and consents for the procedure. All questions were addressed. A time out was performed prior to the initiation of the procedure. Both groins were prepped and draped in sterile fashion. Ultrasound confirmed a patent left common femoral artery. Ultrasound image was saved for documentation. Skin was anesthetized with 1% lidocaine. Using ultrasound guidance, 21 gauge needle was directed into the left common femoral artery and micropuncture dilator set was placed. Five French vascular sheath was placed. C2 catheter was used to cannulate the right common iliac artery. Stiff Glidewire was advanced into the right internal iliac artery and the C2 catheter was placed in the distal common iliac artery. Angiography was performed. The right internal iliac artery was selected with a renegade STC microcatheter. Multiple  branches were selected and until the branch supplying the pseudoaneurysm was cannulated. Vessel supplying the pseudoaneurysm was very irregular and neither wire nor catheter could be advanced distal to the pseudoaneurysm. The proximal aspect of the feeding vessel was embolized with 2 Soft Interlock coils, both measuring 2 mm x 4 cm. Follow-up angiography confirmed occlusion of the feeding artery. The adjacent branches were selected with the Renegade catheter and additional angiography was performed. An adjacent branch appeared to have some retrograde filling of the pseudoaneurysm. Therefore, this vessel was treated with Gel-Foam  slurry embolization. Decreased flow in the vessel following Gel-Foam embolization and no clear evidence for retrograde filling of the pseudoaneurysm following the embolization. Follow-up angiogram was performed through the 5 French catheter. Angiogram through the left groin sheath. Left groin sheath was removed with manual compression. Attempted to use an ExoSeal but the device did not deploy. Hemostasis in left groin following manual compression. FINDINGS: Pseudoaneurysm was identified from a branch of the right internal iliac artery. Branch appears to be coming off the anterior division. The main feeding artery may represent the right internal pudendal artery. The feeding vessel is very irregular. Wire and catheter could not be advanced distal to the origin of the pseudoaneurysm. Once the catheter was within the feeding vessel, contrast was preferentially refluxing which made it difficult to consider particle embolization of the feeding artery. As a result, the feeding branch was treated with 2 soft interlock coils proximal to the pseudoaneurysm. The feeding vessel was successfully embolized. However, the follow-up angiogram demonstrated delayed retrograde filling in the region of the pseudoaneurysm. Adjacent branches were selected and additional angiography was performed. An adjacent  irregular branch which may represent the inferior gluteal artery appeared to be supplying the pseudoaneurysm through retrograde flow. This artery was embolized with Gelfoam slurry until there was pruning of the distal vasculature and slow flow within this vessel. Final pelvic angiogram demonstrated no significant filling of the pseudoaneurysm. IMPRESSION: Pseudoaneurysm originating from a branch of the right internal iliac artery anterior division. The feeding artery was embolized with coils proximal to pseudoaneurysm. Adjacent vessel was treated with a Gel-Foam slurry to decrease the risk of retrograde filling of the pseudoaneurysm. Electronically Signed   By: Markus Daft M.D.   On: 06/08/2019 18:16   [place] Guide Roadmapping  Result Date: 06/08/2019 INDICATION: 53 year old male with a history of rectal cancer and chronic pelvic abscess/necrotic fluid collection with a drain. Patient presented to the hospital with tachycardia and altered mental status. Hemoglobin was 2.7. Patient was found to have small pulmonary emboli and the left lower extremity DVT. CT also demonstrated a pseudoaneurysm in the right pelvis adjacent to the large complex pelvic collection. Patient has been on pressors and requires treatment of the pelvic pseudoaneurysm. Patient also needs an IVC filter because he is not a candidate for anticoagulation at this time. EXAM: PELVIC ARTERIOGRAPHY WITH ANGIOGRAPHY IN THE RIGHT COMMON ILIAC ARTERY, RIGHT INTERNAL ILIAC ARTERY AND RIGHT INTERNAL ILIAC ARTERY BRANCHES. EMBOLIZATION OF RIGHT INTERNAL ILIAC ARTERY BRANCHES ULTRASOUND GUIDANCE FOR VASCULAR ACCESS MEDICATIONS: None ANESTHESIA/SEDATION: Patient was intubated and already on sedation from the ICU. CONTRAST:  154 mL Visipaque 320 FLUOROSCOPY TIME:  Fluoroscopy Time: 25 minutes, 123456 mGy COMPLICATIONS: None immediate. PROCEDURE: Informed consent was obtained from the patient's son following explanation of the  procedure, risks, benefits and alternatives. The patient's son understands, agrees and consents for the procedure. All questions were addressed. A time out was performed prior to the initiation of the procedure. Both groins were prepped and draped in sterile fashion. Ultrasound confirmed a patent left common femoral artery. Ultrasound image was saved for documentation. Skin was anesthetized with 1% lidocaine. Using ultrasound guidance, 21 gauge needle was directed into the left common femoral artery and micropuncture dilator set was placed. Five French vascular sheath was placed. C2 catheter was used to cannulate the right common iliac artery. Stiff Glidewire was advanced into the right internal iliac artery and the C2 catheter was placed in the distal common iliac  artery. Angiography was performed. The right internal iliac artery was selected with a renegade STC microcatheter. Multiple branches were selected and until the branch supplying the pseudoaneurysm was cannulated. Vessel supplying the pseudoaneurysm was very irregular and neither wire nor catheter could be advanced distal to the pseudoaneurysm. The proximal aspect of the feeding vessel was embolized with 2 Soft Interlock coils, both measuring 2 mm x 4 cm. Follow-up angiography confirmed occlusion of the feeding artery. The adjacent branches were selected with the Renegade catheter and additional angiography was performed. An adjacent branch appeared to have some retrograde filling of the pseudoaneurysm. Therefore, this vessel was treated with Gel-Foam slurry embolization. Decreased flow in the vessel following Gel-Foam embolization and no clear evidence for retrograde filling of the pseudoaneurysm following the embolization. Follow-up angiogram was performed through the 5 French catheter. Angiogram through the left groin sheath. Left groin sheath was removed with manual compression. Attempted to use an ExoSeal but the device did not deploy. Hemostasis in  left groin following manual compression. FINDINGS: Pseudoaneurysm was identified from a branch of the right internal iliac artery. Branch appears to be coming off the anterior division. The main feeding artery may represent the right internal pudendal artery. The feeding vessel is very irregular. Wire and catheter could not be advanced distal to the origin of the pseudoaneurysm. Once the catheter was within the feeding vessel, contrast was preferentially refluxing which made it difficult to consider particle embolization of the feeding artery. As a result, the feeding branch was treated with 2 soft interlock coils proximal to the pseudoaneurysm. The feeding vessel was successfully embolized. However, the follow-up angiogram demonstrated delayed retrograde filling in the region of the pseudoaneurysm. Adjacent branches were selected and additional angiography was performed. An adjacent irregular branch which may represent the inferior gluteal artery appeared to be supplying the pseudoaneurysm through retrograde flow. This artery was embolized with Gelfoam slurry until there was pruning of the distal vasculature and slow flow within this vessel. Final pelvic angiogram demonstrated no significant filling of the pseudoaneurysm. IMPRESSION: Pseudoaneurysm originating from a branch of the right internal iliac artery anterior division. The feeding artery was embolized with coils proximal to pseudoaneurysm. Adjacent vessel was treated with a Gel-Foam slurry to decrease the risk of retrograde filling of the pseudoaneurysm. Electronically Signed   By: Markus Daft M.D.   On: 06/08/2019 18:16   Vas [place] Lower Extremity Venous (dvt)  Result Date: 06/10/2019  Lower Venous Study Indications: Swelling, and pulmonary embolism.  Risk Factors: Cancer Colorectal cancer. pseudoaneurysm of the iliac artery repaired 10/18. Limitations: Bandages. Comparison Study: No prior study on file Performing Technologist: Sharion Dove RVS   Examination Guidelines: A complete evaluation includes B-mode imaging, spectral Doppler, color Doppler, and power Doppler as needed of all accessible portions of each vessel. Bilateral testing is considered an integral part of a complete examination. Limited examinations for reoccurring indications may be performed as noted.  +---------+---------------+---------+-----------+----------+--------------+  RIGHT     Compressibility Phasicity Spontaneity Properties Thrombus Aging  +---------+---------------+---------+-----------+----------+--------------+  CFV       Full            Yes       Yes                                    +---------+---------------+---------+-----------+----------+--------------+  SFJ       Full                                                             +---------+---------------+---------+-----------+----------+--------------+  FV Prox   Full                                                             +---------+---------------+---------+-----------+----------+--------------+  FV Mid    Full                                                             +---------+---------------+---------+-----------+----------+--------------+  FV Distal Full                                                             +---------+---------------+---------+-----------+----------+--------------+  PFV       Full                                                             +---------+---------------+---------+-----------+----------+--------------+  POP       Full            Yes       Yes                                    +---------+---------------+---------+-----------+----------+--------------+  PTV       Full                                                             +---------+---------------+---------+-----------+----------+--------------+  PERO      Full                                                             +---------+---------------+---------+-----------+----------+--------------+    +---------+---------------+---------+-----------+----------+--------------+  LEFT      Compressibility Phasicity Spontaneity Properties Thrombus Aging  +---------+---------------+---------+-----------+----------+--------------+  CFV       None                                             Acute           +---------+---------------+---------+-----------+----------+--------------+  SFJ       Full            Yes       Yes                                    +---------+---------------+---------+-----------+----------+--------------+  FV Prox   None                                             Acute           +---------+---------------+---------+-----------+----------+--------------+  FV Mid    None                                             Acute           +---------+---------------+---------+-----------+----------+--------------+  FV Distal None                                             Acute           +---------+---------------+---------+-----------+----------+--------------+  PFV       None                                             Acute           +---------+---------------+---------+-----------+----------+--------------+  POP       None                                             Acute           +---------+---------------+---------+-----------+----------+--------------+  PTV       None                                             Acute           +---------+---------------+---------+-----------+----------+--------------+  PERO      None                                             Acute           +---------+---------------+---------+-----------+----------+--------------+     Summary: Right: There is no evidence of deep vein thrombosis in the lower extremity. Left: Findings consistent with acute deep vein thrombosis involving the left common femoral vein, left femoral vein, left proximal profunda vein, left popliteal vein, left posterior tibial veins, and left peroneal veins. Unable to image EIV and CIV secondary to  abdmonial bandaging and recent surgeries  *See table(s) above for measurements and observations. Electronically signed by Ruta Hinds MD on 06/10/2019 at 11:21:51 AM.    Final     Labs:  CBC: Recent Labs    06/08/19 2123 06/09/19 0017 06/09/19 0406 06/09/19 1023 06/10/19 0419  WBC 34.4*  --  31.0* 33.6* 32.9*  HGB 8.7* 8.5* 8.3* 8.8* 8.9*  HCT 24.9* 25.0* 24.5* 25.9* 26.9*  PLT 205  --  208 193 234    COAGS: Recent Labs    06/08/19 1539 06/09/19 0406 06/10/19 0419 06/11/19  0500  INR 1.5* 1.5* 1.5* 1.4*    BMP: Recent Labs    06/08/19 0306  06/08/19 1539 06/09/19 0017 06/09/19 0406 06/10/19 0419  NA 139   < > 140 139 141 135  K 3.7   < > 3.6 3.4* 3.2* 3.8  CL 103  --  110  --  113* 108  CO2 22  --  21*  --  20* 20*  GLUCOSE 124*  --  127*  --  107* 111*  BUN 25*  --  21*  --  20 17  CALCIUM 6.6*  --  6.2*  --  7.0* 7.3*  CREATININE 1.56*  --  1.41*  --  1.21 1.04  GFRNONAA 50*  --  57*  --  >60 >60  GFRAA 58*  --  >60  --  >60 >60   < > = values in this interval not displayed.    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  Recently upsized to 14Fr for better drainage 10/20 Clinically improved.  Vital signs stable.  WBC remains elevated as of last lab draw. Dark, bloody fluid in JP bulb. No other obvious source of anemia.  Continue current management.  IR following.   Electronically Signed: Docia Barrier, PA 06/11/2019, 4:06 PM   I spent a total of 15 Minutes at the the patient's bedside AND on the patient's hospital floor or unit, greater than 50% of which was counseling/coordinating care for  metastatic rectal cancer, pelvic fluid collection.

## 2019-06-11 NOTE — Progress Notes (Signed)
PROGRESS NOTE    Jonathan Wright  LFY:[id number] DOB: 13-Jul-1966 DOA: 06/07/2019 PCP: Deland Pretty, MD    Brief Narrative:   53 year old gentleman with prior history of metastatic stage IIIb rectal cancer s/p diverting ileostomy with [name] follows up with oncology Dr. Delton Coombes at [facility] with most recent chemotherapy on April 23, 2019, was found to be hypoxic and hypotensive severely anemic ,  sepsis, with acute PE AKI.  He was transferred from [facility] to [facility] and admitted by [facility] for further management.  In September 2020 he was found to have foul-smelling discharge leaking from the sites of the draining tubes, 1 placed by IR at [facility], another one placed at [facility] in July 2020.  He was also found to have a urostomy and a colostomy placed in 2019 with multiple revisions and surgeries.  He underwent flap dissection on the right inner thigh in 2019 that continues to drain.  He was admitted by [facility] for sepsis and transferred to [facility] on 06/11/2019.  Assessment & Plan:   Active Problems:   Sepsis ([place])   Severe anemia   Respiratory arrest ([place])   Pseudoaneurysm ([place])   Other pulmonary embolism without acute cor pulmonale (HCC)   Sepsis with hemorrhagic shock secondary to recurrent pelvic abscesses Resolved. Follow culture data.  Persistent leukocytosis at 32,000.  Repeat CBC and CMP tomorrow.  Acute left common femoral DVT and segmental pulmonary emboli S/p IR embolization on 06/08/2019.  S/p IVC filter/2018.  Acute blood loss anemia secondary to suspected pseudoaneurysm versus GI bleed Transfuse to keep hemoglobin greater than 7.  Continue with PPI twice daily. Hemoglobin stable around 8.9   Recurrent metastatic rectal cancer s/p pelvic exenteration, longstanding chronic pelvic abscesses with fistulization, s/p pelvic radiation with JP drain in place Upsized IR drain.  Patient is able to tolerate regular diet without any nausea or vomiting. Continue  with broad-spectrum IV antibiotics for another 24 hours. Recommend outpatient follow-up at [facility]   AKI probably secondary to hemorrhagic shock Resolved.     DVT prophylaxis: SCDs Code Status: Full code Family Communication: None at bedside Disposition Plan: Pending PT evaluation   Consultants:   General surgery  IR  Procedures: (Upsizing of the pelvic drain by IR on 06/10/2019 ETT 10/17 extubated on 10/18 CT angio of the chest show segmental pulmonary embolism.   Antimicrobials: IV vancomycin, IV meropenem, IV Flagyl, IV Zyvox Subjective: Patient reports feeling better today denies any nausea or vomiting.  Persistent pain controlled with pain medication.  Objective: Vitals:   06/11/19 0900 06/11/19 1000 06/11/19 1100 06/11/19 1200  BP: 117/68 136/81 121/79 (!) 112/98  Pulse: 93 (!) 101 94 95  Resp:      Temp:   99.3 F (37.4 C)   TempSrc:   Oral   SpO2: 100% 100% 100% 100%  Weight:      Height:        Intake/Output Summary (Last 24 hours) at 06/11/2019 1254 Last data filed at 06/11/2019 1200 Gross per 24 hour  Intake 2464.28 ml  Output 1445 ml  Net 1019.28 ml   Filed Weights   06/07/19 1800 06/08/19 0427 06/09/19 0500  Weight: 73.3 kg 73.3 kg 79.9 kg    Examination:  General exam: Appears calm and comfortable  Respiratory system: Clear to auscultation. Respiratory effort normal. Cardiovascular system: S1 & S2 heard, RRR.  Gastrointestinal system: Abdomen is soft, bowel sounds present, nondistended, ileostomy in place, urostomy in place, pelvic drain present. Central nervous  system: Alert and oriented. No focal neurological deficits. Extremities: Pedal edema present Skin: No rashes,  Psychiatry: Mood & affect appropriate.     Data Reviewed: I have personally reviewed following labs and imaging studies  CBC: Recent Labs  Lab 06/07/19 1420 06/07/19 1913  06/08/19 1539 06/08/19 2123 06/09/19 0017 06/09/19 0406 06/09/19 1023  06/10/19 0419  WBC 84.8* 53.8*   < > 37.6* 34.4*  --  31.0* 33.6* 32.9*  NEUTROABS 53.8* 51.6*  --   --   --   --   --   --   --   HGB 2.7* 7.3*   < > 8.3* 8.7* 8.5* 8.3* 8.8* 8.9*  HCT 9.9* 20.8*   < > 23.9* 24.9* 25.0* 24.5* 25.9* 26.9*  MCV 115.1* 92.9   < > 89.2 88.0  --  88.4 90.2 91.8  PLT 729* 388   < > 248 205  --  208 193 234   < > = values in this interval not displayed.   Basic Metabolic Panel: Recent Labs  Lab 06/07/19 1913 06/08/19 0306 06/08/19 0344 06/08/19 1539 06/09/19 0017 06/09/19 0406 06/10/19 0419  NA 133* 139 137 140 139 141 135  K 4.1 3.7 3.5 3.6 3.4* 3.2* 3.8  CL 97* 103  --  110  --  113* 108  CO2 20* 22  --  21*  --  20* 20*  GLUCOSE 285* 124*  --  127*  --  107* 111*  BUN 29* 25*  --  21*  --  20 17  CREATININE 1.74* 1.56*  --  1.41*  --  1.21 1.04  CALCIUM 6.9* 6.6*  --  6.2*  --  7.0* 7.3*  MG 1.9 1.8  --   --   --  2.0 2.0  PHOS 4.7* 3.4  --   --   --  3.3 2.1*   GFR: Estimated Creatinine Clearance: 91.2 mL/min (by C-G formula based on SCr of 1.04 mg/dL). Liver Function Tests: Recent Labs  Lab 06/07/19 1420 06/07/19 1913 06/08/19 0306 06/08/19 1539 06/09/19 0406  AST 31 32 30 22 18   ALT <5 14 16 15 14   ALKPHOS 139* 130* 123 106 105  BILITOT 0.1* 1.6* 1.3* 1.0 0.7  PROT 4.5* 4.1* 4.0* 3.7* 3.8*  ALBUMIN 1.4* 1.3* 1.2* 1.1* 1.1*   No results for input(s): LIPASE, AMYLASE in the last 168 hours. No results for input(s): AMMONIA in the last 168 hours. Coagulation Profile: Recent Labs  Lab 06/07/19 2142 06/08/19 1539 06/09/19 0406 06/10/19 0419 06/11/19 0500  INR 1.4* 1.5* 1.5* 1.5* 1.4*   Cardiac Enzymes: Recent Labs  Lab 06/09/19 0406 06/09/19 1822 06/10/19 0419 06/10/19 1700 06/11/19 0500  CKTOTAL 90 51 32* 22* 26*   BNP (last 3 results) No results for input(s): PROBNP in the last 8760 hours. HbA1C: No results for input(s): HGBA1C in the last 72 hours. CBG: Recent Labs  Lab 06/10/19 1949 06/10/19 2349  06/11/19 0350 06/11/19 0737 06/11/19 1127  GLUCAP 97 86 94 119* 80   Lipid Profile: No results for input(s): CHOL, HDL, LDLCALC, TRIG, CHOLHDL, LDLDIRECT in the last 72 hours. Thyroid Function Tests: No results for input(s): TSH, T4TOTAL, FREET4, T3FREE, THYROIDAB in the last 72 hours. Anemia Panel: No results for input(s): VITAMINB12, FOLATE, FERRITIN, TIBC, IRON, RETICCTPCT in the last 72 hours. Sepsis Labs: Recent Labs  Lab 06/07/19 1913 06/07/19 2142 06/08/19 0748  LATICACIDVEN 5.3* 5.0* 1.5    Recent Results (from the past 240 hour(s))  Blood  culture (routine x 2)     Status: None (Preliminary result)   Collection Time: 06/07/19  3:24 PM   Specimen: BLOOD LEFT HAND  Result Value Ref Range Status   Specimen Description   Final    BLOOD LEFT HAND BOTTLES DRAWN AEROBIC AND ANAEROBIC   Special Requests Blood Culture adequate volume  Final   Culture   Final    NO GROWTH 4 DAYS Performed at [facility], [address], [place], [place] [address]    Report Status PENDING  Incomplete  Blood culture (routine x 2)     Status: None (Preliminary result)   Collection Time: 06/07/19  3:31 PM   Specimen: BLOOD RIGHT HAND  Result Value Ref Range Status   Specimen Description BLOOD RIGHT HAND  Final   Special Requests   Final    BOTTLES DRAWN AEROBIC ONLY Blood Culture adequate volume   Culture   Final    NO GROWTH 4 DAYS Performed at [facility], [address], [place], [place] [address]    Report Status PENDING  Incomplete  SARS Coronavirus 2 by RT PCR (hospital order, performed in [place] hospital lab) Nasopharyngeal Nasopharyngeal Swab     Status: None   Collection Time: 06/07/19  3:50 PM   Specimen: Nasopharyngeal Swab  Result Value Ref Range Status   SARS Coronavirus 2 NEGATIVE NEGATIVE Final    Comment: (NOTE) If result is NEGATIVE SARS-CoV-2 target nucleic acids are NOT DETECTED. The SARS-CoV-2 RNA is generally detectable in upper and lower  respiratory  specimens during the acute phase of infection. The lowest  concentration of SARS-CoV-2 viral copies this assay can detect is 250  copies / mL. A negative result does not preclude SARS-CoV-2 infection  and should not be used as the sole basis for treatment or other  patient management decisions.  A negative result may occur with  improper specimen collection / handling, submission of specimen other  than nasopharyngeal swab, presence of viral mutation(s) within the  areas targeted by this assay, and inadequate number of viral copies  (<250 copies / mL). A negative result must be combined with clinical  observations, patient history, and epidemiological information. If result is POSITIVE SARS-CoV-2 target nucleic acids are DETECTED. The SARS-CoV-2 RNA is generally detectable in upper and lower  respiratory specimens dur ing the acute phase of infection.  Positive  results are indicative of active infection with SARS-CoV-2.  Clinical  correlation with patient history and other diagnostic information is  necessary to determine patient infection status.  Positive results do  not rule out bacterial infection or co-infection with other viruses. If result is PRESUMPTIVE POSTIVE SARS-CoV-2 nucleic acids MAY BE PRESENT.   A presumptive positive result was obtained on the submitted specimen  and confirmed on repeat testing.  While 2019 novel coronavirus  (SARS-CoV-2) nucleic acids may be present in the submitted sample  additional confirmatory testing may be necessary for epidemiological  and / or clinical management purposes  to differentiate between  SARS-CoV-2 and other Sarbecovirus currently known to infect humans.  If clinically indicated additional testing with an alternate test  methodology [id number]) is advised. The SARS-CoV-2 RNA is generally  detectable in upper and lower respiratory sp ecimens during the acute  phase of infection. The expected result is Negative. Fact Sheet for  Patients:  StrictlyIdeas.no Fact Sheet for Healthcare Providers: BankingDealers.co.za This test is not yet approved or cleared by the [place] FDA and has been authorized for detection and/or diagnosis  of SARS-CoV-2 by FDA under an Emergency Use Authorization (EUA).  This EUA will remain in effect (meaning this test can be used) for the duration of the COVID-19 declaration under Section 564(b)(1) of the Act, 21 U.S.C. section 360bbb-3(b)(1), unless the authorization is terminated or revoked sooner. Performed at [facility], [address] [place], [place] [address]   MRSA PCR Screening     Status: None   Collection Time: 06/07/19  6:01 PM   Specimen: Nasopharyngeal  Result Value Ref Range Status   MRSA by PCR NEGATIVE NEGATIVE Final    Comment:        The GeneXpert MRSA Assay (FDA approved for NASAL specimens only), is one component of a comprehensive MRSA colonization surveillance program. It is not intended to diagnose MRSA infection nor to guide or monitor treatment for MRSA infections. Performed at [facility], [place] [address], [place], [place] [address]   C difficile quick scan w PCR reflex     Status: Abnormal   Collection Time: 06/07/19  8:24 PM   Specimen: STOOL  Result Value Ref Range Status   C Diff antigen POSITIVE (A) NEGATIVE Final   C Diff toxin NEGATIVE NEGATIVE Final   C Diff interpretation Results are indeterminate. See PCR results.  Final    Comment: Performed at [facility], [place] [address] [place], [place] [address]  C. Diff by PCR, Reflexed     Status: Abnormal   Collection Time: 06/07/19  8:24 PM  Result Value Ref Range Status   Toxigenic C. Difficile by PCR POSITIVE (A) NEGATIVE Final    Comment: Positive for toxigenic C. difficile with little to no toxin production. Only treat if clinical presentation suggests symptomatic illness. Performed at [facility], [place] [address]  [place], [place] [address]   Aerobic/Anaerobic Culture (surgical/deep wound)     Status: None   Collection Time: 06/07/19  8:35 PM   Specimen: Fluid; Other  Result Value Ref Range Status   Specimen Description FLUID  Final   Special Requests JP DRAIN FLUID FROM RIGHT BUTTOCK  Final   Gram Stain   Final    RARE WBC PRESENT, PREDOMINANTLY PMN ABUNDANT GRAM NEGATIVE RODS FEW GRAM POSITIVE COCCI FEW GRAM POSITIVE RODS Performed at [facility], [place] [address] [place], [place] [address]    Culture   Final    ABUNDANT KLEBSIELLA PNEUMONIAE MODERATE ENTEROCOCCUS FAECIUM MODERATE ESCHERICHIA COLI MIXED ANAEROBIC FLORA PRESENT.  CALL LAB IF FURTHER IID REQUIRED.    Report Status 06/11/2019 FINAL  Final   Organism ID, Bacteria KLEBSIELLA PNEUMONIAE  Final   Organism ID, Bacteria ENTEROCOCCUS FAECIUM  Final   Organism ID, Bacteria ESCHERICHIA COLI  Final      Susceptibility   Escherichia coli - MIC*    AMPICILLIN >=32 RESISTANT Resistant     CEFAZOLIN <=4 SENSITIVE Sensitive     CEFEPIME <=1 SENSITIVE Sensitive     CEFTAZIDIME 4 SENSITIVE Sensitive     CEFTRIAXONE <=1 SENSITIVE Sensitive     CIPROFLOXACIN >=4 RESISTANT Resistant     GENTAMICIN <=1 SENSITIVE Sensitive     IMIPENEM <=0.25 SENSITIVE Sensitive     TRIMETH/SULFA >=320 RESISTANT Resistant     AMPICILLIN/SULBACTAM >=32 RESISTANT Resistant     Extended ESBL NEGATIVE Sensitive     * MODERATE ESCHERICHIA COLI   Enterococcus faecium - MIC*    AMPICILLIN <=2 SENSITIVE Sensitive     VANCOMYCIN <=0.5 SENSITIVE Sensitive     GENTAMICIN  SYNERGY SENSITIVE Sensitive     * MODERATE ENTEROCOCCUS FAECIUM   Klebsiella pneumoniae - MIC*    AMPICILLIN >=32 RESISTANT Resistant     CEFAZOLIN <=4 SENSITIVE Sensitive     CEFEPIME <=1 SENSITIVE Sensitive     CEFTAZIDIME <=1 SENSITIVE Sensitive     CEFTRIAXONE <=1 SENSITIVE Sensitive     CIPROFLOXACIN 1 SENSITIVE Sensitive     GENTAMICIN <=1 SENSITIVE Sensitive     IMIPENEM <=0.25  SENSITIVE Sensitive     TRIMETH/SULFA >=320 RESISTANT Resistant     AMPICILLIN/SULBACTAM 4 SENSITIVE Sensitive     PIP/TAZO 8 SENSITIVE Sensitive     Extended ESBL NEGATIVE Sensitive     * ABUNDANT KLEBSIELLA PNEUMONIAE         Radiology Studies: Ir Catheter Tube Change  Result Date: 06/10/2019 INDICATION: 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 Recent coiling of a bleeding pseudoaneurysm of a branch of the right internal iliac artery. The indwelling presacral abscess drainage catheter requires exchange and up sizing due to leakage fluid around the drain and through the pelvic floor. EXAM: IR CATHETER TUBE CHANGE MEDICATIONS: None ANESTHESIA/SEDATION: None FLUOROSCOPY TIME:  1 minutes and 18 seconds.  35.3 mGy. CONTRAST:  10 mL Omnipaque [id number] COMPLICATIONS: None immediate. PROCEDURE: Informed written consent was obtained from the patient after a thorough discussion of the procedural risks, benefits and alternatives. All questions were addressed. Maximal Sterile Barrier Technique was utilized including caps, mask, sterile gowns, sterile gloves, sterile drape, hand hygiene and skin antiseptic. A timeout was performed prior to the initiation of the procedure. With the patient in a decubitus position, the pre-existing drainage catheter was inspected under fluoroscopy and injected with contrast material. The catheter was then removed. A 5 French catheter was advanced through the tract and into the presacral pelvic space. The catheter was removed over a guidewire. A new 4 French percutaneous drainage catheter was advanced into the presacral space. Catheter position was confirmed by fluoroscopy. The catheter was attached to a suction bulb. It was secured at the skin with a Prolene retention suture, StatLock device and overlying dressing. FINDINGS: At the time of the procedure, the pelvic drain had retracted posteriorly with the tip located just posterior to the tip of the sacrum. Catheter  size was increased from 12 [place] to 25 French and the new catheter positioned deeper in the presacral pelvis. There was immediate return of bloody fluid from the drain. IMPRESSION: Exchange and up sizing of posterior pelvic drainage catheter. The indwelling 12 French catheter had retracted posterior to the presacral space. A new 14 French drainage catheter was advanced into the presacral space with return of bloody fluid. This drain was attached to a suction bulb. Electronically Signed   By: Aletta Edouard M.D.   On: 06/10/2019 15:46        Scheduled Meds:  sodium chloride   Intravenous Once   chlorhexidine gluconate (MEDLINE KIT)  15 mL Mouth Rinse BID   Chlorhexidine Gluconate Cloth  6 each Topical Daily   fentaNYL  1 patch Transdermal Q72H   And   fentaNYL  1 patch Transdermal Q72H   And   fentaNYL  1 patch Transdermal Q72H   pantoprazole  40 mg Oral BID   vancomycin  500 mg Oral Q6H   Continuous Infusions:  sodium chloride 10 mL/hr at 06/11/19 1200   linezolid (ZYVOX) IV Stopped (06/11/19 0610)   meropenem (MERREM) IV Stopped (06/11/19 0542)   metronidazole Stopped (06/11/19 7897)     LOS:  4 days        Hosie Poisson, MD [facility] Pager [phone]  If 7PM-7AM, please contact night-coverage [email or web address] Password [email or web address] 06/11/2019, 12:54 PM

## 2019-06-11 NOTE — Progress Notes (Signed)
Responded to spiritual care consult to support patient and assist with AD.  Nurse will give patient document and notify Chaplain when ready.  Will follow as needed.   Jaclynn Major, [place], [facility], Pager [phone]

## 2019-06-12 LAB — GLUCOSE, CAPILLARY
Glucose-Capillary: 103 mg/dL — ABNORMAL HIGH (ref 70–99)
Glucose-Capillary: 114 mg/dL — ABNORMAL HIGH (ref 70–99)
Glucose-Capillary: 123 mg/dL — ABNORMAL HIGH (ref 70–99)
Glucose-Capillary: 82 mg/dL (ref 70–99)
Glucose-Capillary: 91 mg/dL (ref 70–99)
Glucose-Capillary: 97 mg/dL (ref 70–99)

## 2019-06-12 LAB — BASIC METABOLIC PANEL
Anion gap: 6 (ref 5–15)
BUN: 8 mg/dL (ref 6–20)
CO2: 21 mmol/L — ABNORMAL LOW (ref 22–32)
Calcium: 7 mg/dL — ABNORMAL LOW (ref 8.9–10.3)
Chloride: 106 mmol/L (ref 98–111)
Creatinine, Ser: 0.91 mg/dL (ref 0.61–1.24)
GFR calc Af Amer: >60 mL/min (ref >60)
GFR calc non Af Amer: >60 mL/min (ref >60)
Glucose, Bld: 132 mg/dL — ABNORMAL HIGH (ref 70–99)
Potassium: 3.6 mmol/L (ref 3.5–5.1)
Sodium: 133 mmol/L — ABNORMAL LOW (ref 135–145)

## 2019-06-12 LAB — PROTIME-INR
INR: 1.2 (ref 0.8–1.2)
Prothrombin Time: 15.4 seconds — ABNORMAL HIGH (ref 11.4–15.2)

## 2019-06-12 LAB — CULTURE, BLOOD (ROUTINE X 2)
Culture: NO GROWTH
Culture: NO GROWTH
Special Requests: ADEQUATE
Special Requests: ADEQUATE

## 2019-06-12 LAB — CBC
HCT: 27.1 % — ABNORMAL LOW (ref 39.0–52.0)
Hemoglobin: 8.6 g/dL — ABNORMAL LOW (ref 13.0–17.0)
MCH: 29.8 pg (ref 26.0–34.0)
MCHC: 31.7 g/dL (ref 30.0–36.0)
MCV: 93.8 fL (ref 80.0–100.0)
Platelets: 296 10*3/uL (ref 150–400)
RBC: 2.89 MIL/uL — ABNORMAL LOW (ref 4.22–5.81)
RDW: 19.8 % — ABNORMAL HIGH (ref 11.5–15.5)
WBC: 20 10*3/uL — ABNORMAL HIGH (ref 4.0–10.5)
nRBC: 0.1 % (ref 0.0–0.2)

## 2019-06-12 LAB — CK
Total CK: 108 U/L (ref 49–397)
Total CK: 44 U/L — ABNORMAL LOW (ref 49–397)

## 2019-06-12 MED ORDER — PROCHLORPERAZINE EDISYLATE 10 MG/2ML IJ SOLN
10.0000 mg | Freq: Four times a day (QID) | INTRAMUSCULAR | Status: DC
  Administered 2019-06-13 (×5): 10 mg via INTRAVENOUS
  Filled 2019-06-12 (×5): qty 2

## 2019-06-12 MED ORDER — ADULT MULTIVITAMIN W/MINERALS CH
1.0000 | ORAL_TABLET | Freq: Every day | ORAL | Status: DC
  Administered 2019-06-12 – 2019-06-17 (×6): 1 via ORAL
  Filled 2019-06-12 (×6): qty 1

## 2019-06-12 MED ORDER — ALPRAZOLAM 0.5 MG PO TABS
0.5000 mg | ORAL_TABLET | Freq: Every evening | ORAL | Status: DC | PRN
  Administered 2019-06-12: 0.5 mg via ORAL
  Filled 2019-06-12: qty 1

## 2019-06-12 MED ORDER — OXYCODONE HCL 5 MG PO TABS
40.0000 mg | ORAL_TABLET | ORAL | Status: DC
  Administered 2019-06-12 – 2019-06-13 (×7): 40 mg via ORAL
  Filled 2019-06-12 (×7): qty 8

## 2019-06-12 MED ORDER — FENTANYL 100 MCG/HR TD PT72
1.0000 | MEDICATED_PATCH | TRANSDERMAL | Status: DC
  Administered 2019-06-12 – 2019-06-16 (×3): 1 via TRANSDERMAL
  Filled 2019-06-12 (×4): qty 1

## 2019-06-12 MED ORDER — ENSURE ENLIVE PO LIQD
237.0000 mL | Freq: Two times a day (BID) | ORAL | Status: DC
  Administered 2019-06-12 – 2019-06-17 (×10): 237 mL via ORAL

## 2019-06-12 MED ORDER — FUROSEMIDE 10 MG/ML IJ SOLN
20.0000 mg | Freq: Two times a day (BID) | INTRAMUSCULAR | Status: DC
  Administered 2019-06-12 – 2019-06-14 (×5): 20 mg via INTRAVENOUS
  Filled 2019-06-12 (×5): qty 2

## 2019-06-12 NOTE — Progress Notes (Signed)
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 Delton Coombes at [facility] with most recent chemotherapy on April 23, 2019, was found to be hypoxic and hypotensive severely anemic ,  sepsis, with acute PE AKI.  He was transferred from [facility] to [facility] and admitted by [facility] for further management.  In September 2020 he was found to have foul-smelling discharge leaking from the sites of the draining tubes, 1 placed by IR at [facility], another one placed at [facility] in July 2020.  He was also found to have a urostomy and a colostomy placed in 2019 with multiple revisions and surgeries.  He underwent flap dissection on the right inner thigh in 2019 that continues to drain.  He was admitted by [facility] for sepsis and transferred to [facility] on 06/11/2019.  Interim history Patient reports having persistent pain in the back and around the wounds and drains and requesting IV pain medication.  PT eval recommending home health PT with home health aide.  Assessment & Plan:   Active Problems:   Sepsis ([place])   Severe anemia   Respiratory arrest ([place])   Pseudoaneurysm ([place])   Other pulmonary embolism without acute cor pulmonale (HCC)   Sepsis with hemorrhagic shock secondary to recurrent pelvic abscesses Resolved. Pelvic abscess cultures revealed E. coli, Klebsiella, Enterobacter sensitive to meropenem. Low-grade temp of 99.3 this morning and leukocytosis improved to 20,000s.   Repeat CBC and CMP tomorrow.  Acute left common femoral DVT and segmental pulmonary emboli S/p IR embolization on 06/08/2019.  S/p IVC filter/2018. Patient denies any chest pain or shortness of breath at this time.  Acute blood loss anemia secondary to suspected pseudoaneurysm versus GI bleed  Transfuse to keep hemoglobin greater than 7.  Continue with PPI twice daily. Hemoglobin stable around 8.  No bleeding.   Recurrent metastatic rectal cancer s/p pelvic exenteration, longstanding chronic pelvic abscesses with fistulization, s/p pelvic radiation with JP drain in place Upsized IR drain.  She is able to tolerate regular diet without any nausea or vomiting but he reports abdominal pain requiring IV pain medication. Recommend outpatient follow-up at [facility]   AKI probably secondary to hemorrhagic shock Resolved. creatinins is 0.91 today.   Currently waiting for PT eval and possibly home with home health PT once his pain is optimally controlled.   Mild hyponatremia:  Possibly from fluid overload.  Two doses of IV lasix ordered for today.    C diff infection? Toxigenic C difficile PCR is positive- on oral vancomycin.   DVT prophylaxis: SCDs Code Status: Full code Family Communication: None at bedside Disposition Plan: Pending PT evaluation   Consultants:   General surgery  IR  Procedures: (Upsizing of the pelvic drain by IR on 06/10/2019 ETT 10/17 extubated on 10/18 CT angio of the chest show segmental pulmonary embolism.   Antimicrobials:oral vancomycin for  c diff and Iv meropenam for klebsiella .  Subjective: Reports worse abdominal pain and reports feeling heavy.  Nauseated, no vomiting.  Significant drainage  Objective: Vitals:   06/12/19 0400 06/12/19 0800 06/12/19 1147 06/12/19 1200  BP: 119/78 125/72  130/84  Pulse: 84   88  Resp:      Temp: 98.9 F (37.2 C)  99 F (37.2 C)   TempSrc: Oral  Oral   SpO2: 97%     Weight:      Height:        Intake/Output Summary (Last 24 hours) at 06/12/2019 1416 Last data filed at 06/12/2019 1023 Gross per 24 hour  Intake 1844.05 ml  Output 3460 ml  Net -1615.95 ml   Filed Weights   06/07/19 1800 06/08/19 0427 06/09/19 0500  Weight: 73.3 kg 73.3 kg 79.9 kg    Examination:  General  exam: Mild distress from pain. Respiratory system: Air entry fair bilateral, no wheezing or rhonchi Cardiovascular system: S1-S2 heard, regular rate rhythm Gastrointestinal system: Abdomen is soft, nontender nondistended bowel sounds are present ileostomy is in place, urostomy in place, pelvic drain present.   Central nervous system: Alert and oriented, grossly nonfocal Extremities: 2+ pedal edema present Skin: No rashes seen Psychiatry: Mood is appropriate    Data Reviewed: I have personally reviewed following labs and imaging studies  CBC: Recent Labs  Lab 06/07/19 1420 06/07/19 1913  06/08/19 2123 06/09/19 0017 06/09/19 0406 06/09/19 1023 06/10/19 0419 06/12/19 0425  WBC 84.8* 53.8*   < > 34.4*  --  31.0* 33.6* 32.9* 20.0*  NEUTROABS 53.8* 51.6*  --   --   --   --   --   --   --   HGB 2.7* 7.3*   < > 8.7* 8.5* 8.3* 8.8* 8.9* 8.6*  HCT 9.9* 20.8*   < > 24.9* 25.0* 24.5* 25.9* 26.9* 27.1*  MCV 115.1* 92.9   < > 88.0  --  88.4 90.2 91.8 93.8  PLT 729* 388   < > 205  --  208 193 234 296   < > = values in this interval not displayed.   Basic Metabolic Panel: Recent Labs  Lab 06/07/19 1913 06/08/19 0306  06/08/19 1539 06/09/19 0017 06/09/19 0406 06/10/19 0419 06/12/19 0425  NA 133* 139   < > 140 139 141 135 133*  K 4.1 3.7   < > 3.6 3.4* 3.2* 3.8 3.6  CL 97* 103  --  110  --  113* 108 106  CO2 20* 22  --  21*  --  20* 20* 21*  GLUCOSE 285* 124*  --  127*  --  107* 111* 132*  BUN 29* 25*  --  21*  --  20 17 8   CREATININE 1.74* 1.56*  --  1.41*  --  1.21 1.04 0.91  CALCIUM 6.9* 6.6*  --  6.2*  --  7.0* 7.3* 7.0*  MG 1.9 1.8  --   --   --  2.0 2.0  --   PHOS 4.7* 3.4  --   --   --  3.3 2.1*  --    < > = values in this interval not displayed.   GFR: Estimated Creatinine Clearance: 104.2 mL/min (by C-G formula based on SCr of 0.91 mg/dL). Liver Function Tests: Recent Labs  Lab 06/07/19 1420 06/07/19 1913 06/08/19 0306 06/08/19 1539 06/09/19 0406  AST 31 32 30  22 18   ALT <5 14 16 15 14   ALKPHOS 139* 130* 123 106 105  BILITOT 0.1* 1.6* 1.3* 1.0 0.7  PROT 4.5* 4.1* 4.0* 3.7* 3.8*  ALBUMIN 1.4* 1.3* 1.2* 1.1* 1.1*   No results for input(s): LIPASE, AMYLASE in the last 168 hours. No results for input(s): AMMONIA in the last 168 hours. Coagulation Profile: Recent Labs  Lab 06/08/19 1539 06/09/19 0406 06/10/19 0419 06/11/19 0500 06/12/19 0425  INR 1.5* 1.5* 1.5* 1.4* 1.2  Cardiac Enzymes: Recent Labs  Lab 06/10/19 0419 06/10/19 1700 06/11/19 0500 06/11/19 1700 06/12/19 0425  CKTOTAL 32* 22* 26* 89 108   BNP (last 3 results) No results for input(s): PROBNP in the last 8760 hours. HbA1C: No results for input(s): HGBA1C in the last 72 hours. CBG: Recent Labs  Lab 06/11/19 1931 06/11/19 2329 06/12/19 0414 06/12/19 0849 06/12/19 1144  GLUCAP 105* 94 103* 123* 82   Lipid Profile: No results for input(s): CHOL, HDL, LDLCALC, TRIG, CHOLHDL, LDLDIRECT in the last 72 hours. Thyroid Function Tests: No results for input(s): TSH, T4TOTAL, FREET4, T3FREE, THYROIDAB in the last 72 hours. Anemia Panel: No results for input(s): VITAMINB12, FOLATE, FERRITIN, TIBC, IRON, RETICCTPCT in the last 72 hours. Sepsis Labs: Recent Labs  Lab 06/07/19 1913 06/07/19 2142 06/08/19 0748  LATICACIDVEN 5.3* 5.0* 1.5    Recent Results (from the past 240 hour(s))  Blood culture (routine x 2)     Status: None   Collection Time: 06/07/19  3:24 PM   Specimen: BLOOD LEFT HAND  Result Value Ref Range Status   Specimen Description   Final    BLOOD LEFT HAND BOTTLES DRAWN AEROBIC AND ANAEROBIC   Special Requests Blood Culture adequate volume  Final   Culture   Final    NO GROWTH 5 DAYS Performed at [facility], [address], [place], [place] [address]    Report Status 06/12/2019 FINAL  Final  Blood culture (routine x 2)     Status: None   Collection Time: 06/07/19  3:31 PM   Specimen: BLOOD RIGHT HAND  Result Value Ref Range Status   Specimen  Description BLOOD RIGHT HAND  Final   Special Requests   Final    BOTTLES DRAWN AEROBIC ONLY Blood Culture adequate volume   Culture   Final    NO GROWTH 5 DAYS Performed at [facility], [address] [place], [place] [address]    Report Status 06/12/2019 FINAL  Final  SARS Coronavirus 2 by RT PCR (hospital order, performed in [place] hospital lab) Nasopharyngeal Nasopharyngeal Swab     Status: None   Collection Time: 06/07/19  3:50 PM   Specimen: Nasopharyngeal Swab  Result Value Ref Range Status   SARS Coronavirus 2 NEGATIVE NEGATIVE Final    Comment: (NOTE) If result is NEGATIVE SARS-CoV-2 target nucleic acids are NOT DETECTED. The SARS-CoV-2 RNA is generally detectable in upper and lower  respiratory specimens during the acute phase of infection. The lowest  concentration of SARS-CoV-2 viral copies this assay can detect is 250  copies / mL. A negative result does not preclude SARS-CoV-2 infection  and should not be used as the sole basis for treatment or other  patient management decisions.  A negative result may occur with  improper specimen collection / handling, submission of specimen other  than nasopharyngeal swab, presence of viral mutation(s) within the  areas targeted by this assay, and inadequate number of viral copies  (<250 copies / mL). A negative result must be combined with clinical  observations, patient history, and epidemiological information. If result is POSITIVE SARS-CoV-2 target nucleic acids are DETECTED. The SARS-CoV-2 RNA is generally detectable in upper and lower  respiratory specimens dur ing the acute phase of infection.  Positive  results are indicative of active infection with SARS-CoV-2.  Clinical  correlation with patient history and other diagnostic information is  necessary to determine patient infection status.  Positive results do  not rule out bacterial infection or co-infection with  other viruses. If result is PRESUMPTIVE POSTIVE  SARS-CoV-2 nucleic acids MAY BE PRESENT.   A presumptive positive result was obtained on the submitted specimen  and confirmed on repeat testing.  While 2019 novel coronavirus  (SARS-CoV-2) nucleic acids may be present in the submitted sample  additional confirmatory testing may be necessary for epidemiological  and / or clinical management purposes  to differentiate between  SARS-CoV-2 and other Sarbecovirus currently known to infect humans.  If clinically indicated additional testing with an alternate test  methodology [phone]) is advised. The SARS-CoV-2 RNA is generally  detectable in upper and lower respiratory sp ecimens during the acute  phase of infection. The expected result is Negative. Fact Sheet for Patients:  StrictlyIdeas.no Fact Sheet for Healthcare Providers: BankingDealers.co.za This test is not yet approved or cleared by the [place] FDA and has been authorized for detection and/or diagnosis of SARS-CoV-2 by FDA under an Emergency Use Authorization (EUA).  This EUA will remain in effect (meaning this test can be used) for the duration of the COVID-19 declaration under Section 564(b)(1) of the Act, 21 U.S.C. section 360bbb-3(b)(1), unless the authorization is terminated or revoked sooner. Performed at [facility], [address] [place], [place] [address]   MRSA PCR Screening     Status: None   Collection Time: 06/07/19  6:01 PM   Specimen: Nasopharyngeal  Result Value Ref Range Status   MRSA by PCR NEGATIVE NEGATIVE Final    Comment:        The GeneXpert MRSA Assay (FDA approved for NASAL specimens only), is one component of a comprehensive MRSA colonization surveillance program. It is not intended to diagnose MRSA infection nor to guide or monitor treatment for MRSA infections. Performed at [facility], [place] [address], [place], [place] [address]   C difficile quick scan w PCR reflex     Status:  Abnormal   Collection Time: 06/07/19  8:24 PM   Specimen: STOOL  Result Value Ref Range Status   C Diff antigen POSITIVE (A) NEGATIVE Final   C Diff toxin NEGATIVE NEGATIVE Final   C Diff interpretation Results are indeterminate. See PCR results.  Final    Comment: Performed at [facility], [place] [address] [place], [place] [address]  C. Diff by PCR, Reflexed     Status: Abnormal   Collection Time: 06/07/19  8:24 PM  Result Value Ref Range Status   Toxigenic C. Difficile by PCR POSITIVE (A) NEGATIVE Final    Comment: Positive for toxigenic C. difficile with little to no toxin production. Only treat if clinical presentation suggests symptomatic illness. Performed at [facility], [place] [address], [place], [place] [address]   Aerobic/Anaerobic Culture (surgical/deep wound)     Status: None   Collection Time: 06/07/19  8:35 PM   Specimen: Fluid; Other  Result Value Ref Range Status   Specimen Description FLUID  Final   Special Requests JP DRAIN FLUID FROM RIGHT BUTTOCK  Final   Gram Stain   Final    RARE WBC PRESENT, PREDOMINANTLY PMN ABUNDANT GRAM NEGATIVE RODS FEW GRAM POSITIVE COCCI FEW GRAM POSITIVE RODS Performed at [facility], [place] [address], [place], [place] [address]    Culture   Final    ABUNDANT KLEBSIELLA PNEUMONIAE MODERATE ENTEROCOCCUS FAECIUM MODERATE ESCHERICHIA COLI MIXED ANAEROBIC FLORA PRESENT.  CALL LAB IF FURTHER IID REQUIRED.    Report Status 06/11/2019 FINAL  Final   Organism ID, Bacteria KLEBSIELLA PNEUMONIAE  Final   Organism ID, Bacteria ENTEROCOCCUS FAECIUM  Final   Organism ID, Bacteria ESCHERICHIA COLI  Final      Susceptibility   Escherichia coli - MIC*    AMPICILLIN >=32 RESISTANT Resistant     CEFAZOLIN <=4 SENSITIVE Sensitive     CEFEPIME <=1 SENSITIVE Sensitive     CEFTAZIDIME 4 SENSITIVE Sensitive     CEFTRIAXONE <=1 SENSITIVE Sensitive     CIPROFLOXACIN >=4 RESISTANT Resistant     GENTAMICIN <=1 SENSITIVE Sensitive      IMIPENEM <=0.25 SENSITIVE Sensitive     TRIMETH/SULFA >=320 RESISTANT Resistant     AMPICILLIN/SULBACTAM >=32 RESISTANT Resistant     Extended ESBL NEGATIVE Sensitive     * MODERATE ESCHERICHIA COLI   Enterococcus faecium - MIC*    AMPICILLIN <=2 SENSITIVE Sensitive     VANCOMYCIN <=0.5 SENSITIVE Sensitive     GENTAMICIN SYNERGY SENSITIVE Sensitive     * MODERATE ENTEROCOCCUS FAECIUM   Klebsiella pneumoniae - MIC*    AMPICILLIN >=32 RESISTANT Resistant     CEFAZOLIN <=4 SENSITIVE Sensitive     CEFEPIME <=1 SENSITIVE Sensitive     CEFTAZIDIME <=1 SENSITIVE Sensitive     CEFTRIAXONE <=1 SENSITIVE Sensitive     CIPROFLOXACIN 1 SENSITIVE Sensitive     GENTAMICIN <=1 SENSITIVE Sensitive     IMIPENEM <=0.25 SENSITIVE Sensitive     TRIMETH/SULFA >=320 RESISTANT Resistant     AMPICILLIN/SULBACTAM 4 SENSITIVE Sensitive     PIP/TAZO 8 SENSITIVE Sensitive     Extended ESBL NEGATIVE Sensitive     * ABUNDANT KLEBSIELLA PNEUMONIAE         Radiology Studies: Ir Catheter Tube Change  Result Date: 06/10/2019 INDICATION: 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 Recent coiling of a bleeding pseudoaneurysm of a branch of the right internal iliac artery. The indwelling presacral abscess drainage catheter requires exchange and up sizing due to leakage fluid around the drain and through the pelvic floor. EXAM: IR CATHETER TUBE CHANGE MEDICATIONS: None ANESTHESIA/SEDATION: None FLUOROSCOPY TIME:  1 minutes and 18 seconds.  35.3 mGy. CONTRAST:  10 mL Omnipaque [id number] COMPLICATIONS: None immediate. PROCEDURE: Informed written consent was obtained from the patient after a thorough discussion of the procedural risks, benefits and alternatives. All questions were addressed. Maximal Sterile Barrier Technique was utilized including caps, mask, sterile gowns, sterile gloves, sterile drape, hand hygiene and skin antiseptic. A timeout was performed prior to the initiation of the procedure. With the  patient in a decubitus position, the pre-existing drainage catheter was inspected under fluoroscopy and injected with contrast material. The catheter was then removed. A 5 French catheter was advanced through the tract and into the presacral pelvic space. The catheter was removed over a guidewire. A new 100 French percutaneous drainage catheter was advanced into the presacral space. Catheter position was confirmed by fluoroscopy. The catheter was attached to a suction bulb. It was secured at the skin with a Prolene retention suture, StatLock device and overlying dressing. FINDINGS: At the time of the procedure, the pelvic drain had retracted posteriorly with the tip located just posterior to the tip of the sacrum. Catheter size was increased from 12 [place] to 61 French and the new catheter positioned deeper in the presacral pelvis. There was immediate return of bloody fluid from the drain. IMPRESSION: Exchange and up sizing of posterior pelvic drainage catheter. The indwelling 12 French catheter had retracted posterior to the presacral space. A new 14 French drainage catheter was advanced into the  presacral space with return of bloody fluid. This drain was attached to a suction bulb. Electronically Signed   By: Aletta Edouard M.D.   On: 06/10/2019 15:46        Scheduled Meds: . sodium chloride   Intravenous Once  . chlorhexidine gluconate (MEDLINE KIT)  15 mL Mouth Rinse BID  . Chlorhexidine Gluconate Cloth  6 each Topical Daily  . feeding supplement (ENSURE ENLIVE)  237 mL Oral BID BM  . fentaNYL  1 patch Transdermal Q72H   And  . fentaNYL  1 patch Transdermal Q72H   And  . fentaNYL  1 patch Transdermal Q72H  . furosemide  20 mg Intravenous BID  . multivitamin with minerals  1 tablet Oral Daily  . pantoprazole  40 mg Oral BID  . vancomycin  500 mg Oral Q6H   Continuous Infusions: . sodium chloride Stopped (06/11/19 1424)  . meropenem (MERREM) IV 2 g (06/12/19 1230)     LOS: 5 days         Hosie Poisson, MD [facility] Pager [phone]  If 7PM-7AM, please contact night-coverage [email or web address] Password [email or web address] 06/12/2019, 2:16 PM

## 2019-06-12 NOTE — Progress Notes (Signed)
Nutrition Follow-up  DOCUMENTATION CODES:   Not applicable  INTERVENTION:   -Ensure Enlive po BID, each supplement provides 350 kcal and 20 grams of protein -Magic cup TID with meals, each supplement provides 290 kcal and 9 grams of protein -MVI with minerals daily  NUTRITION DIAGNOSIS:   Increased nutrient needs related to cancer and cancer related treatments as evidenced by estimated needs.  Ongoing  GOAL:   Patient will meet greater than or equal to 90% of their needs  Progressing   MONITOR:   PO intake, Supplement acceptance, Labs, Weight trends, Skin, I & O's  REASON FOR ASSESSMENT:   Consult Assessment of nutrition requirement/status  ASSESSMENT:   53 year old male who presented to the ED on 10/17 with AMS and respiratory distress. PMH recurrent metastatic stage IIIb rectal adenocarcinoma s/p multiple treatments at [facility] (chemotherapy, s/p robotic assisted coloanal anastomosis, s/p diverting ileostomy), s/p pelvic exenteration and longstanding chronic pelvic abscess with fistulization. Work-up revealing severe anemia, sepsis, acute PE, AKI, and severe acidosis. Pt required intubation, pressor support, and PRBC transfusion.  10/20- s/p upsize of pelvic drain, advanced to regular diet  Reviewed I/O's: -634 ml x 24 hours and +11 since admission  Drain output: 10 ml x 24 hours  Colostomy output: 2.6 L x 24 hours  Pt remains on enteric precautions. RD did not enter room in an effort to preserve PPE.   Per chart review, pt is tolerating solid foods well. Noted meal completion 75% yesterday morning. Given increased nutritional needs, pt would greatly benefit from addition of nutritional supplements.  Palliative care following for pain control.   Labs reviewed: Na: 133, CBGS: 94-105.  Diet Order:   Diet Order            Diet regular Room service appropriate? Yes; Fluid consistency: Thin  Diet effective now              EDUCATION NEEDS:   No  education needs have been identified at this time  Skin:  Skin Assessment: Skin Integrity Issues: Skin Integrity Issues:: Other (Comment) Other: open wound left buttocks (old JP drain site)  Last BM:  06/12/19 (775 ml via colostomy)  Height:   Ht Readings from Last 1 Encounters:  06/07/19 6' (1.829 m)    Weight:   Wt Readings from Last 1 Encounters:  06/09/19 79.9 kg    Ideal Body Weight:  80.9 kg  BMI:  Body mass index is 23.89 kg/m.  Estimated Nutritional Needs:   Kcal:  2200-2400  Protein:  110-130 grams  Fluid:  >/= 2.0 L    Saafir Abdullah A. Jimmye Norman, RD, LDN, [place] Registered Dietitian II Certified Diabetes Care and Education Specialist Pager: [phone] After hours Pager: [phone]

## 2019-06-12 NOTE — Evaluation (Signed)
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 Pt intubated and requiring vasopressor support and transfused with 2 units of PRBC.  Transferred from [facility] to [facility] for higher level of care. 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 Pt is currently limited in safe mobility by sacral pain and increased drainage with upright, increased LE edema, and generalized weakness and decreased endurance. Evaluation limited to bed level today. Given ROM and strength in the bed anticipate pt will be able to mobilize with increased assistance. PT recommending maximal HH services at discharge to improve mobility and self care. PT will continue to follow acutely.      Follow Up Recommendations Home health PT;Supervision/Assistance - 24 hour([facility])    Equipment Recommendations  Other (comment);Wheelchair (measurements PT);Wheelchair cushion (measurements PT)(tilt in space for sacrum relief)    Recommendations for Other Services OT consult     Precautions / Restrictions Precautions Precautions: Fall      Mobility  Bed Mobility               General bed mobility comments: unable to attempt to increased sacral pain             Pertinent Vitals/Pain Pain Assessment: 0-10 Pain Score: 6  Pain Location: abdomen, sacral region,  LE swelling  Pain Descriptors / Indicators: Aching;Throbbing;Sharp;Shooting Pain Intervention(s): Limited activity within patient's tolerance;Monitored during session;Repositioned    Home Living Family/patient expects to be discharged to:: Private residence Living Arrangements: Spouse/significant other Available Help at Discharge: Friend(s);Available 24 hours/day Type of Home: House Home Access: Stairs to enter Entrance Stairs-Rails: None Entrance Stairs-Number of Steps: 1 Home Layout: Two level;Bed/bath upstairs;Able to live on main level with bedroom/bathroom Home Equipment: None      Prior Function Level of Independence: Needs assistance   Gait / Transfers Assistance Needed: 1 month ago, independent in household distance ambulation, last 2 weeks physical assist for transport to car for appointments, and very limited household ambulation  ADL's / Homemaking Assistance Needed: 1 month ago independent with bathing and dressing, last 2 weeks requires assist           Extremity/Trunk Assessment   Upper Extremity Assessment Upper Extremity Assessment: Generalized weakness    Lower Extremity Assessment Lower Extremity Assessment: RLE deficits/detail;LLE deficits/detail RLE Deficits / Details: ROM WFL, strength grossly 3+/5 RLE Sensation: WNL RLE Coordination: WNL LLE Deficits / Details: LLE DVT, ROM WLF, strength 3+/5 LLE Sensation: WNL LLE Coordination: WNL       Communication   Communication: No difficulties  Cognition Arousal/Alertness: Awake/alert Behavior During Therapy: WFL for tasks assessed/performed Overall Cognitive Status: Within Functional Limits for tasks assessed                                        General Comments General comments (skin integrity, edema, etc.): +2 pitting  edema in bilateral LE L>R    Exercises General Exercises - Lower Extremity Ankle Circles/Pumps: AROM;Both;20 reps;Supine Quad Sets: AROM;Both;10  reps;Supine Short Arc Quad: AROM;Both;10 reps;Supine Heel Slides: AROM;Both;10 reps;Supine Hip Flexion/Marching: AROM;Both;10 reps;Supine   Assessment/Plan    PT Assessment Patient needs continued PT services  PT Problem List Decreased strength;Decreased activity tolerance;Decreased balance;Decreased mobility;Pain       PT Treatment Interventions DME instruction;Gait training;Functional mobility training;Stair training;Therapeutic activities;Therapeutic exercise;Balance training;Cognitive remediation;Patient/family education    PT Goals (Current goals can be found in the Care Plan section)  Acute Rehab PT Goals Patient Stated Goal: get up walking again PT Goal Formulation: With patient/family Time For Goal Achievement: 06/26/19 Potential to Achieve Goals: Good    Frequency Min 3X/week    AM-PAC PT "6 Clicks" Mobility  Outcome Measure Help needed turning from your back to your side while in a flat bed without using bedrails?: None Help needed moving from lying on your back to sitting on the side of a flat bed without using bedrails?: A Lot Help needed moving to and from a bed to a chair (including a wheelchair)?: A Lot Help needed standing up from a chair using your arms (e.g., wheelchair or bedside chair)?: A Lot Help needed to walk in hospital room?: Total Help needed climbing 3-5 steps with a railing? : Total 6 Click Score: 12    End of Session   Activity Tolerance: Patient limited by pain Patient left: in bed;with call bell/phone within reach;with family/visitor present Nurse Communication: Mobility status PT Visit Diagnosis: Other abnormalities of gait and mobility (R26.89);Muscle weakness (generalized) (M62.81);Difficulty in walking, not elsewhere classified (R26.2);Pain Pain - part of body: (sacrum. abdomen)    Time: [id number] PT Time Calculation (min) (ACUTE ONLY): 42 min   Charges:   PT Evaluation $PT Eval Moderate Complexity: 1 Mod PT Treatments $Therapeutic  Exercise: 8-22 mins $Self Care/Home Management: 8-22        Yusra Ravert B. Migdalia Dk PT, DPT Acute Rehabilitation Services Pager [phone] Office [phone]   [place] 06/12/2019, 12:25 PM

## 2019-06-12 NOTE — Progress Notes (Signed)
Daily Progress Note   Patient Name: Jonathan Wright       Date: 06/12/2019 DOB: [birth year]  Age: 53 y.o. MRN#: [id number] Attending Physician: Hosie Poisson, MD Primary Care Physician: Deland Pretty, MD Admit Date: 06/07/2019  Reason for Consultation/Follow-up: Pain control  Subjective: I met today with patient and his significant other, [name].    He was much more withdrawn today and reports that this Is due to much poorer pain control.  [name] reports that "this is what happens" after 48 hours with fentanyl patch in place.  We reviewed pain again in detail and discussed options moving forward for pain overnight.  Length of Stay: 5  Current Medications: Scheduled Meds:  . sodium chloride   Intravenous Once  . chlorhexidine gluconate (MEDLINE KIT)  15 mL Mouth Rinse BID  . Chlorhexidine Gluconate Cloth  6 each Topical Daily  . feeding supplement (ENSURE ENLIVE)  237 mL Oral BID BM  . fentaNYL  1 patch Transdermal Q48H   And  . fentaNYL  1 patch Transdermal Q48H   And  . fentaNYL  1 patch Transdermal Q48H  . furosemide  20 mg Intravenous BID  . multivitamin with minerals  1 tablet Oral Daily  . oxyCODONE  40 mg Oral Q3H  . pantoprazole  40 mg Oral BID  . [START ON 06/13/2019] prochlorperazine  10 mg Intravenous Q6H  . vancomycin  500 mg Oral Q6H    Continuous Infusions: . sodium chloride Stopped (06/11/19 1424)  . meropenem (MERREM) IV Stopped (06/12/19 1300)    PRN Meds: ALPRAZolam, HYDROmorphone (DILAUDID) injection  Physical Exam         General: Alert, but withdrawn and not very interactive. Chronically ill appearing Heart: Regular rate and rhythm. No murmur appreciated. Lungs: Good air movement, clear Abdomen: Soft, ileostomy, urostomy, and pelvic drain noted.   Ext: + edema Skin: Warm and dry Neuro: Grossly intact, nonfocal.   Vital Signs: BP 125/79 (BP Location: Right Arm)   Pulse 78   Temp 99.3 F (37.4 C) (Oral)   Resp 16   Ht 6' (1.829 m)   Wt 79.9 kg   SpO2 99%   BMI 23.89 kg/m  SpO2: SpO2: 99 % O2 Device: O2 Device: Room Air O2 Flow Rate: O2 Flow Rate (L/min): 3 L/min  Intake/output summary:  Intake/Output Summary (Last 24 hours) at 06/12/2019 2129 Last data filed at 06/12/2019 2000 Gross per 24 hour  Intake 1439.86 ml  Output 5660 ml  Net -4220.14 ml   LBM: Last BM Date: 06/10/19 Baseline Weight: Weight: 74 kg Most recent weight: Weight: 79.9 kg       Palliative Assessment/Data:      Patient Active Problem List   Diagnosis Date Noted  . Pseudoaneurysm ([place])   . Other pulmonary embolism without acute cor pulmonale ([place])   . Respiratory arrest ([place]) 06/07/2019  . Goals of care, counseling/discussion 10/11/2018  . Lymphadenopathy, inguinal   . Sepsis ([place]) 06/19/2016  . UTI (urinary tract infection) 06/19/2016  . Perirectal abscess 06/19/2016  . Severe anemia 06/19/2016  . Hyponatremia 06/19/2016  . Depression with anxiety 06/19/2016  . Rectal bleeding 06/19/2016  . Fever of unknown origin (FUO) 06/19/2016  . Sepsis, unspecified organism ([place]) 06/19/2016  . S/P PICC central line placement 01/13/2016  . Rectal cancer ([place]) 12/17/2015  . Internal hemorrhoids 12/10/2013    Palliative Care Assessment & Plan   Patient Profile: 53 y.o. male  with past medical history of depression, metastatic rectal cancer currently followed by Dr. 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  Palliative care consulted for pain management.    Assessment: Patient Active Problem List   Diagnosis  Date Noted  . Pseudoaneurysm ([place])   . Other pulmonary embolism without acute cor pulmonale ([place])   . Respiratory arrest ([place]) 06/07/2019  . Goals of care, counseling/discussion 10/11/2018  . Lymphadenopathy, inguinal   . Sepsis ([place]) 06/19/2016  . UTI (urinary tract infection) 06/19/2016  . Perirectal abscess 06/19/2016  . Severe anemia 06/19/2016  . Hyponatremia 06/19/2016  . Depression with anxiety 06/19/2016  . Rectal bleeding 06/19/2016  . Fever of unknown origin (FUO) 06/19/2016  . Sepsis, unspecified organism ([place]) 06/19/2016  . S/P PICC central line placement 01/13/2016  . Rectal cancer ([place]) 12/17/2015  . Internal hemorrhoids 12/10/2013     Recommendations/Plan:  Pain: He appears to be much worse on exam today.  His partner reports that his pain seems to worsen after 48 hours on fentanyl patch.  Has been getting oxycodone and dilaudid regularly.  Discussed options of replacing patches (he may be someone who requires patch change very 48 rather than every 72 hours) and continuing with current regimen otherwise vs using PCA overnight.  He requested to continue oxycodone but have it scheduled tonight and refuse if he does not need it rather than needing to request it.  Anxiety/poor sleep: Uses alprazolam as needed at home.  Addition of alprazolam as needed at night for sleep.  Goals of Care and Additional Recommendations:  Limitations on Scope of Treatment: Full Scope Treatment  Code Status:    Code Status Orders  (From admission, onward)         Start     Ordered   06/07/19 1855  Full code  Continuous     06/07/19 1857        Code Status History    Date Active Date Inactive Code Status Order ID Comments User Context   06/19/2016 2054 06/23/2016 1309 Full Code [id number]  Vianne Bulls, MD ED   Advance Care Planning Activity       Prognosis:   Unable to determine  Discharge Planning:  To  Be Determined  Care plan was discussed with patient,  significant other  Thank you for allowing the Palliative Medicine Team to assist in the care of this patient.   Time In: 1900 Time Out: 1945 Total Time 45 Prolonged Time Billed  No       Greater than 50%  of this time was spent counseling and coordinating care related to the above assessment and plan.  Micheline Rough, MD  Please contact Palliative Medicine Team phone at [phone] for questions and concerns.

## 2019-06-13 ENCOUNTER — Encounter (HOSPITAL_COMMUNITY): Payer: BC Managed Care – PPO

## 2019-06-13 LAB — CBC WITH DIFFERENTIAL/PLATELET
Abs Immature Granulocytes: 0.25 10*3/uL — ABNORMAL HIGH (ref 0.00–0.07)
Basophils Absolute: 0 10*3/uL (ref 0.0–0.1)
Basophils Relative: 0 %
Eosinophils Absolute: 0.3 10*3/uL (ref 0.0–0.5)
Eosinophils Relative: 2 %
HCT: 29.4 % — ABNORMAL LOW (ref 39.0–52.0)
Hemoglobin: 9.2 g/dL — ABNORMAL LOW (ref 13.0–17.0)
Immature Granulocytes: 2 %
Lymphocytes Relative: 6 %
Lymphs Abs: 1 10*3/uL (ref 0.7–4.0)
MCH: 29.5 pg (ref 26.0–34.0)
MCHC: 31.3 g/dL (ref 30.0–36.0)
MCV: 94.2 fL (ref 80.0–100.0)
Monocytes Absolute: 1.7 10*3/uL — ABNORMAL HIGH (ref 0.1–1.0)
Monocytes Relative: 11 %
Neutro Abs: 12 10*3/uL — ABNORMAL HIGH (ref 1.7–7.7)
Neutrophils Relative %: 79 %
Platelets: 314 10*3/uL (ref 150–400)
RBC: 3.12 MIL/uL — ABNORMAL LOW (ref 4.22–5.81)
RDW: 20 % — ABNORMAL HIGH (ref 11.5–15.5)
WBC: 15.2 10*3/uL — ABNORMAL HIGH (ref 4.0–10.5)
nRBC: 0 % (ref 0.0–0.2)

## 2019-06-13 LAB — COMPREHENSIVE METABOLIC PANEL
ALT: 13 U/L (ref 0–44)
AST: 21 U/L (ref 15–41)
Albumin: 1.2 g/dL — ABNORMAL LOW (ref 3.5–5.0)
Alkaline Phosphatase: 107 U/L (ref 38–126)
Anion gap: 9 (ref 5–15)
BUN: 7 mg/dL (ref 6–20)
CO2: 23 mmol/L (ref 22–32)
Calcium: 7.4 mg/dL — ABNORMAL LOW (ref 8.9–10.3)
Chloride: 104 mmol/L (ref 98–111)
Creatinine, Ser: 0.77 mg/dL (ref 0.61–1.24)
GFR calc Af Amer: >60 mL/min (ref >60)
GFR calc non Af Amer: >60 mL/min (ref >60)
Glucose, Bld: 92 mg/dL (ref 70–99)
Potassium: 3.9 mmol/L (ref 3.5–5.1)
Sodium: 136 mmol/L (ref 135–145)
Total Bilirubin: 0.4 mg/dL (ref 0.3–1.2)
Total Protein: 4.3 g/dL — ABNORMAL LOW (ref 6.5–8.1)

## 2019-06-13 LAB — PROTIME-INR
INR: 1.3 — ABNORMAL HIGH (ref 0.8–1.2)
Prothrombin Time: 16.3 seconds — ABNORMAL HIGH (ref 11.4–15.2)

## 2019-06-13 LAB — CK: Total CK: 41 U/L — ABNORMAL LOW (ref 49–397)

## 2019-06-13 LAB — GLUCOSE, CAPILLARY: Glucose-Capillary: 94 mg/dL (ref 70–99)

## 2019-06-13 MED ORDER — HYDROMORPHONE HCL 2 MG PO TABS
16.0000 mg | ORAL_TABLET | ORAL | Status: DC
  Administered 2019-06-13 – 2019-06-14 (×8): 16 mg via ORAL
  Filled 2019-06-13 (×9): qty 8

## 2019-06-13 MED ORDER — PROCHLORPERAZINE EDISYLATE 10 MG/2ML IJ SOLN
10.0000 mg | Freq: Four times a day (QID) | INTRAMUSCULAR | Status: DC | PRN
  Administered 2019-06-14 – 2019-06-16 (×3): 10 mg via INTRAVENOUS
  Filled 2019-06-13 (×5): qty 2

## 2019-06-13 NOTE — TOC Progression Note (Signed)
Transition of Care [facility]) - Progression Note    Patient Details  Name: Jonathan Wright MRN: [id number] Date of Birth: 12/07/1965  [facility]) CM/SW Contact  Bartholomew Crews, RN Phone Number: [phone] 06/13/2019, 4:40 PM  Clinical Narrative:    Received call back from [facility] who declined to offer services d/t no capacity to cover [place] at this time. [facility] - out of network with patient's plan. [facility] - out of network. [facility] - does not cover [place]. [facility] declined to offer services. [facility] - no response to message at this time. [facility] - no capacity for [place]. [facility] declined stating no capacity for at least a week and patient's usually decline services d/t 30% copay with no out of pocket limit. [facility] is out of network. [facility] has no capacity for [employer] area.   At this point there is no home health option for this patient.   Expected Discharge Plan: [place]    Expected Discharge Plan and Services Expected Discharge Plan: [place]   Discharge Planning Services: CM Consult Post Acute Care Choice: [place] arrangements for the past 2 months: Single Family Home                 DME Arranged: [name] DME Agency: NA       HH Arranged: RN, PT           Social Determinants of Health (SDOH) Interventions    Readmission Risk Interventions No flowsheet data found.

## 2019-06-13 NOTE — Progress Notes (Signed)
PROGRESS NOTE    Jonathan Wright  ZOX:[id number] DOB: 09-30-1965 DOA: 06/07/2019 PCP: Deland Pretty, MD    Brief Narrative:   53 year old gentleman with prior history of metastatic stage IIIb rectal cancer s/p diverting ileostomy with [name] follows up with oncology Dr. Delton Coombes at [facility] with most recent chemotherapy on April 23, 2019, was found to be hypoxic and hypotensive severely anemic ,  sepsis, with acute PE AKI.  He was transferred from [facility] to [facility] and admitted by [facility] for further management.  In September 2020 he was found to have foul-smelling discharge leaking from the sites of the draining tubes, 1 placed by IR at [facility], another one placed at [facility] in July 2020.  He was also found to have a urostomy and a colostomy placed in 2019 with multiple revisions and surgeries.  He underwent flap dissection on the right inner thigh in 2019 that continues to drain.  He was admitted by [facility] for sepsis and transferred to [facility] on 06/11/2019.  Interim history Plan to complete 7 days of IV meropenem and discharged on oral vancomycin.  Patient's pain better controlled with 300 MCG of fentanyl and oxycodone.  Assessment & Plan:   Active Problems:   Sepsis ([place])   Severe anemia   Respiratory arrest ([place])   Pseudoaneurysm ([place])   Other pulmonary embolism without acute cor pulmonale (HCC)   Sepsis with hemorrhagic shock secondary to recurrent pelvic abscesses Resolved. Pelvic abscess cultures revealed E. coli, Klebsiella, Enterobacter sensitive to meropenem. No fever overnight and leukocytosis has improved to 15,000.  Will complete 7 days of IV meropenem and plan for discharge tomorrow.  Acute left common femoral DVT and segmental pulmonary emboli S/p IR embolization on 06/08/2019.  S/p IVC filter/2018. Patient denies any chest pain or shortness of breath at this time.  Acute blood loss anemia secondary to suspected pseudoaneurysm versus GI bleed  Transfuse to keep hemoglobin greater than 7.  Continue with PPI twice daily. Hemoglobin stable between 8 and 9   Recurrent metastatic rectal cancer s/p pelvic exenteration, longstanding chronic pelvic abscesses with fistulization, s/p pelvic radiation with JP drain in place Upsized IR drain. Recommend outpatient follow-up at [facility] He is able to tolerate regular diet without any nausea, vomiting.  Abdominal pain controlled with 100 MCG of fentanyl and 40 mg of oxycodone every 3 hours.  Appreciate palliative care input.   AKI probably secondary to hemorrhagic shock Resolved  Currently waiting for PT eval and possibly home with home health PT once his pain is optimally controlled.   Mild hyponatremia:  Possibly from fluid overload.  Resolved with IV Lasix.  Continue another day of IV Lasix.  C diff infection? Toxigenic C difficile PCR is positive- on oral vancomycin and  complete the course  DVT prophylaxis: SCDs Code Status: Full code Family Communication: None at bedside Disposition Plan: DC home tomorrow after IV meropenem dose   Consultants:   General surgery  IR  Procedures: (Upsizing of the pelvic drain by IR on 06/10/2019 ETT 10/17 extubated on 10/18 CT angio of the chest show segmental pulmonary embolism.   Antimicrobials:oral vancomycin for  c diff and Iv meropenam for klebsiella .  Subjective: Patient reports abdominal pain has improved and his pedal edema and lower extremity edema is improving. Objective: Vitals:   06/13/19 0746 06/13/19 0800 06/13/19 1146 06/13/19 1200  BP:  129/77  132/81  Pulse:      Resp:      Temp: 98.9  F (37.2 C)  99.2 F (37.3 C)   TempSrc: Oral  Oral   SpO2:      Weight:      Height:        Intake/Output Summary (Last 24 hours) at 06/13/2019 1436 Last data filed at 06/13/2019 1200 Gross per 24 hour  Intake 737 ml  Output 4730 ml  Net -3993 ml   Filed Weights   06/08/19 0427 06/09/19 0500 06/13/19 0500   Weight: 73.3 kg 79.9 kg 80.8 kg    Examination:  General exam: Alert and comfortable Respiratory system: Air entry fair, no wheezing or rhonchi Cardiovascular system: S1 S2 heard regular rate rhythm Gastrointestinal system: Abdomen is soft, nontender, bowel sounds are adequate.  Ileostomy in place, urostomy in place and pelvic drain present  Central nervous system: Alert and oriented, grossly nonfocal Extremities: 2+ leg edema present left greater than right probably from left lower extremity DVT. Skin: No rashes seen Psychiatry: Mood is appropriate    Data Reviewed: I have personally reviewed following labs and imaging studies  CBC: Recent Labs  Lab 06/07/19 1420 06/07/19 1913  06/09/19 0406 06/09/19 1023 06/10/19 0419 06/12/19 0425 06/13/19 0505  WBC 84.8* 53.8*   < > 31.0* 33.6* 32.9* 20.0* 15.2*  NEUTROABS 53.8* 51.6*  --   --   --   --   --  12.0*  HGB 2.7* 7.3*   < > 8.3* 8.8* 8.9* 8.6* 9.2*  HCT 9.9* 20.8*   < > 24.5* 25.9* 26.9* 27.1* 29.4*  MCV 115.1* 92.9   < > 88.4 90.2 91.8 93.8 94.2  PLT 729* 388   < > 208 193 234 296 314   < > = values in this interval not displayed.   Basic Metabolic Panel: Recent Labs  Lab 06/07/19 1913 06/08/19 0306  06/08/19 1539 06/09/19 0017 06/09/19 0406 06/10/19 0419 06/12/19 0425 06/13/19 0505  NA 133* 139   < > 140 139 141 135 133* 136  K 4.1 3.7   < > 3.6 3.4* 3.2* 3.8 3.6 3.9  CL 97* 103  --  110  --  113* 108 106 104  CO2 20* 22  --  21*  --  20* 20* 21* 23  GLUCOSE 285* 124*  --  127*  --  107* 111* 132* 92  BUN 29* 25*  --  21*  --  20 17 8 7   CREATININE 1.74* 1.56*  --  1.41*  --  1.21 1.04 0.91 0.77  CALCIUM 6.9* 6.6*  --  6.2*  --  7.0* 7.3* 7.0* 7.4*  MG 1.9 1.8  --   --   --  2.0 2.0  --   --   PHOS 4.7* 3.4  --   --   --  3.3 2.1*  --   --    < > = values in this interval not displayed.   GFR: Estimated Creatinine Clearance: 118.6 mL/min (by C-G formula based on SCr of 0.77 mg/dL). Liver Function Tests:  Recent Labs  Lab 06/07/19 1913 06/08/19 0306 06/08/19 1539 06/09/19 0406 06/13/19 0505  AST 32 30 22 18 21   ALT 14 16 15 14 13   ALKPHOS 130* 123 106 105 107  BILITOT 1.6* 1.3* 1.0 0.7 0.4  PROT 4.1* 4.0* 3.7* 3.8* 4.3*  ALBUMIN 1.3* 1.2* 1.1* 1.1* 1.2*   No results for input(s): LIPASE, AMYLASE in the last 168 hours. No results for input(s): AMMONIA in the last 168 hours. Coagulation Profile: Recent Labs  Lab 06/09/19 0406 06/10/19 0419 06/11/19 0500 06/12/19 0425 06/13/19 0505  INR 1.5* 1.5* 1.4* 1.2 1.3*   Cardiac Enzymes: Recent Labs  Lab 06/11/19 0500 06/11/19 1700 06/12/19 0425 06/12/19 1706 06/13/19 0505  CKTOTAL 26* 89 108 44* 41*   BNP (last 3 results) No results for input(s): PROBNP in the last 8760 hours. HbA1C: No results for input(s): HGBA1C in the last 72 hours. CBG: Recent Labs  Lab 06/12/19 1144 06/12/19 1536 06/12/19 1946 06/12/19 2335 06/13/19 0334  GLUCAP 82 97 114* 91 94   Lipid Profile: No results for input(s): CHOL, HDL, LDLCALC, TRIG, CHOLHDL, LDLDIRECT in the last 72 hours. Thyroid Function Tests: No results for input(s): TSH, T4TOTAL, FREET4, T3FREE, THYROIDAB in the last 72 hours. Anemia Panel: No results for input(s): VITAMINB12, FOLATE, FERRITIN, TIBC, IRON, RETICCTPCT in the last 72 hours. Sepsis Labs: Recent Labs  Lab 06/07/19 1913 06/07/19 2142 06/08/19 0748  LATICACIDVEN 5.3* 5.0* 1.5    Recent Results (from the past 240 hour(s))  Blood culture (routine x 2)     Status: None   Collection Time: 06/07/19  3:24 PM   Specimen: BLOOD LEFT HAND  Result Value Ref Range Status   Specimen Description   Final    BLOOD LEFT HAND BOTTLES DRAWN AEROBIC AND ANAEROBIC   Special Requests Blood Culture adequate volume  Final   Culture   Final    NO GROWTH 5 DAYS Performed at [facility], [address] [place], [place] [address]    Report Status 06/12/2019 FINAL  Final  Blood culture (routine x 2)     Status: None    Collection Time: 06/07/19  3:31 PM   Specimen: BLOOD RIGHT HAND  Result Value Ref Range Status   Specimen Description BLOOD RIGHT HAND  Final   Special Requests   Final    BOTTLES DRAWN AEROBIC ONLY Blood Culture adequate volume   Culture   Final    NO GROWTH 5 DAYS Performed at [facility], [address] [place], [place] [address]    Report Status 06/12/2019 FINAL  Final  SARS Coronavirus 2 by RT PCR (hospital order, performed in [place] hospital lab) Nasopharyngeal Nasopharyngeal Swab     Status: None   Collection Time: 06/07/19  3:50 PM   Specimen: Nasopharyngeal Swab  Result Value Ref Range Status   SARS Coronavirus 2 NEGATIVE NEGATIVE Final    Comment: (NOTE) If result is NEGATIVE SARS-CoV-2 target nucleic acids are NOT DETECTED. The SARS-CoV-2 RNA is generally detectable in upper and lower  respiratory specimens during the acute phase of infection. The lowest  concentration of SARS-CoV-2 viral copies this assay can detect is 250  copies / mL. A negative result does not preclude SARS-CoV-2 infection  and should not be used as the sole basis for treatment or other  patient management decisions.  A negative result may occur with  improper specimen collection / handling, submission of specimen other  than nasopharyngeal swab, presence of viral mutation(s) within the  areas targeted by this assay, and inadequate number of viral copies  (<250 copies / mL). A negative result must be combined with clinical  observations, patient history, and epidemiological information. If result is POSITIVE SARS-CoV-2 target nucleic acids are DETECTED. The SARS-CoV-2 RNA is generally detectable in upper and lower  respiratory specimens dur ing the acute phase of infection.  Positive  results are indicative of active infection with SARS-CoV-2.  Clinical  correlation with patient history and other diagnostic information is  necessary to determine patient infection status.  Positive results do   not rule out bacterial infection or co-infection with other viruses. If result is PRESUMPTIVE POSTIVE SARS-CoV-2 nucleic acids MAY BE PRESENT.   A presumptive positive result was obtained on the submitted specimen  and confirmed on repeat testing.  While 2019 novel coronavirus  (SARS-CoV-2) nucleic acids may be present in the submitted sample  additional confirmatory testing may be necessary for epidemiological  and / or clinical management purposes  to differentiate between  SARS-CoV-2 and other Sarbecovirus currently known to infect humans.  If clinically indicated additional testing with an alternate test  methodology [phone]) is advised. The SARS-CoV-2 RNA is generally  detectable in upper and lower respiratory sp ecimens during the acute  phase of infection. The expected result is Negative. Fact Sheet for Patients:  StrictlyIdeas.no Fact Sheet for Healthcare Providers: BankingDealers.co.za This test is not yet approved or cleared by the [place] FDA and has been authorized for detection and/or diagnosis of SARS-CoV-2 by FDA under an Emergency Use Authorization (EUA).  This EUA will remain in effect (meaning this test can be used) for the duration of the COVID-19 declaration under Section 564(b)(1) of the Act, 21 U.S.C. section 360bbb-3(b)(1), unless the authorization is terminated or revoked sooner. Performed at [facility], [address], [place], [place] [address]   MRSA PCR Screening     Status: None   Collection Time: 06/07/19  6:01 PM   Specimen: Nasopharyngeal  Result Value Ref Range Status   MRSA by PCR NEGATIVE NEGATIVE Final    Comment:        The GeneXpert MRSA Assay (FDA approved for NASAL specimens only), is one component of a comprehensive MRSA colonization surveillance program. It is not intended to diagnose MRSA infection nor to guide or monitor treatment for MRSA infections. Performed at [facility], [place] [address], [place], [place] [address]   C difficile quick scan w PCR reflex     Status: Abnormal   Collection Time: 06/07/19  8:24 PM   Specimen: STOOL  Result Value Ref Range Status   C Diff antigen POSITIVE (A) NEGATIVE Final   C Diff toxin NEGATIVE NEGATIVE Final   C Diff interpretation Results are indeterminate. See PCR results.  Final    Comment: Performed at [facility], [place] [address], [place], [place] [address]  C. Diff by PCR, Reflexed     Status: Abnormal   Collection Time: 06/07/19  8:24 PM  Result Value Ref Range Status   Toxigenic C. Difficile by PCR POSITIVE (A) NEGATIVE Final    Comment: Positive for toxigenic C. difficile with little to no toxin production. Only treat if clinical presentation suggests symptomatic illness. Performed at [facility], [place] [address], [place], [place] [address]   Aerobic/Anaerobic Culture (surgical/deep wound)     Status: None   Collection Time: 06/07/19  8:35 PM   Specimen: Fluid; Other  Result Value Ref Range Status   Specimen Description FLUID  Final   Special Requests JP DRAIN FLUID FROM RIGHT BUTTOCK  Final   Gram Stain   Final    RARE WBC PRESENT, PREDOMINANTLY PMN ABUNDANT GRAM NEGATIVE RODS FEW GRAM POSITIVE COCCI FEW GRAM POSITIVE RODS Performed at [facility], [place] [address], [place], [place] [address]    Culture   Final    ABUNDANT KLEBSIELLA PNEUMONIAE MODERATE ENTEROCOCCUS FAECIUM MODERATE ESCHERICHIA COLI MIXED ANAEROBIC FLORA PRESENT.  CALL LAB IF FURTHER  IID REQUIRED.    Report Status 06/11/2019 FINAL  Final   Organism ID, Bacteria KLEBSIELLA PNEUMONIAE  Final   Organism ID, Bacteria ENTEROCOCCUS FAECIUM  Final   Organism ID, Bacteria ESCHERICHIA COLI  Final      Susceptibility   Escherichia coli - MIC*    AMPICILLIN >=32 RESISTANT Resistant     CEFAZOLIN <=4 SENSITIVE Sensitive     CEFEPIME <=1 SENSITIVE Sensitive     CEFTAZIDIME 4 SENSITIVE Sensitive     CEFTRIAXONE <=1  SENSITIVE Sensitive     CIPROFLOXACIN >=4 RESISTANT Resistant     GENTAMICIN <=1 SENSITIVE Sensitive     IMIPENEM <=0.25 SENSITIVE Sensitive     TRIMETH/SULFA >=320 RESISTANT Resistant     AMPICILLIN/SULBACTAM >=32 RESISTANT Resistant     Extended ESBL NEGATIVE Sensitive     * MODERATE ESCHERICHIA COLI   Enterococcus faecium - MIC*    AMPICILLIN <=2 SENSITIVE Sensitive     VANCOMYCIN <=0.5 SENSITIVE Sensitive     GENTAMICIN SYNERGY SENSITIVE Sensitive     * MODERATE ENTEROCOCCUS FAECIUM   Klebsiella pneumoniae - MIC*    AMPICILLIN >=32 RESISTANT Resistant     CEFAZOLIN <=4 SENSITIVE Sensitive     CEFEPIME <=1 SENSITIVE Sensitive     CEFTAZIDIME <=1 SENSITIVE Sensitive     CEFTRIAXONE <=1 SENSITIVE Sensitive     CIPROFLOXACIN 1 SENSITIVE Sensitive     GENTAMICIN <=1 SENSITIVE Sensitive     IMIPENEM <=0.25 SENSITIVE Sensitive     TRIMETH/SULFA >=320 RESISTANT Resistant     AMPICILLIN/SULBACTAM 4 SENSITIVE Sensitive     PIP/TAZO 8 SENSITIVE Sensitive     Extended ESBL NEGATIVE Sensitive     * ABUNDANT KLEBSIELLA PNEUMONIAE         Radiology Studies: No results found.      Scheduled Meds: . sodium chloride   Intravenous Once  . chlorhexidine gluconate (MEDLINE KIT)  15 mL Mouth Rinse BID  . Chlorhexidine Gluconate Cloth  6 each Topical Daily  . feeding supplement (ENSURE ENLIVE)  237 mL Oral BID BM  . fentaNYL  1 patch Transdermal Q48H   And  . fentaNYL  1 patch Transdermal Q48H   And  . fentaNYL  1 patch Transdermal Q48H  . furosemide  20 mg Intravenous BID  . multivitamin with minerals  1 tablet Oral Daily  . oxyCODONE  40 mg Oral Q3H  . pantoprazole  40 mg Oral BID  . prochlorperazine  10 mg Intravenous Q6H  . vancomycin  500 mg Oral Q6H   Continuous Infusions: . sodium chloride Stopped (06/11/19 1424)  . meropenem (MERREM) IV 2 g (06/13/19 1414)     LOS: 6 days        Hosie Poisson, MD [facility] Pager [phone]  If 7PM-7AM, please  contact night-coverage [email or web address] Password [email or web address] 06/13/2019, 2:36 PM

## 2019-06-13 NOTE — Evaluation (Signed)
Occupational Therapy Evaluation Patient Details Name: Jonathan Wright MRN: [id number] DOB: [birth year] Today's Date: 06/13/2019    History of Present Illness 53 year old male with hx of metastatic rectal cancer metastatic stage IIIb rectal cancer s/p diverting ileostomy, presenting 06/07/19 with respiratory distress and syncopal episode found to be hypoxic and hypotensive with workup showing patient severely anemic (Hgb 2.7), sepsis with WBC 84.8, acute PE, AKI, severe acidosis. Pt intubated and requiring vasopressor support and transfused with 2 units of PRBC.  Transferred from [facility] to [facility] for higher level of care. S/p 06/08/19 embolization of Right pelvic pseudoaneurysm involving the right internal iliac branch and IVCF placement due to PE and LLE DVT  Extubated 06/09/19   Clinical Impression   Pt PTA: Pt was independent and mobilizing well until mid September and has been decreasing in function  Since. Pt with supportive partner and family who assist pt. Pt currently performing ADL functional mobility using RW with minA +2 for IV/lines and pt with a few LOB episodes with poor ability to right reactions. Pt modA to maxA for LB ADL as pt in too severe of pain to perform peri care. Pt set-upA for UB ADL. Pt performing static sitting and standing >2 mins minguardA. Pt would benefit from continued OT skilled services for ADL, mobility and safety. OT following acutely.       Follow Up Recommendations  Home health OT;Supervision - Intermittent    Equipment Recommendations  (RW)    Recommendations for Other Services       Precautions / Restrictions Precautions Precautions: Fall Precaution Comments: 1 bottom drain Restrictions Weight Bearing Restrictions: No      Mobility Bed Mobility Overal bed mobility: Needs Assistance Bed Mobility: Rolling;Sidelying to Sit;Sit to Supine;Sit to Sidelying Rolling: Min assist Sidelying to sit: Min assist   Sit to supine: Min assist Sit to sidelying:  Min assist General bed mobility comments: minA due to eleavtion of trunk and BLE management  Transfers Overall transfer level: Needs assistance Equipment used: Rolling walker (2 wheeled) Transfers: Sit to/from Stand Sit to Stand: Min assist;+2 safety/equipment         General transfer comment: +2 for safe ambulation due to unsteadiness with gait with LOB epsiodes requiring assist to steady pt as pt with limited righting reactions.    Balance Overall balance assessment: Needs assistance   Sitting balance-Leahy Scale: Good       Standing balance-Leahy Scale: Poor Standing balance comment:  pt reliant on external support                           ADL either performed or assessed with clinical judgement   ADL Overall ADL's : Needs assistance/impaired Eating/Feeding: Modified independent;Sitting;Bed level   Grooming: Modified independent;Sitting;Bed level   Upper Body Bathing: Set up;Sitting;Bed level   Lower Body Bathing: Moderate assistance;Cueing for safety;Sitting/lateral leans;Sit to/from stand   Upper Body Dressing : Set up;Sitting   Lower Body Dressing: Moderate assistance;Sitting/lateral leans;Sit to/from stand;Cueing for safety   Toilet Transfer: Minimal assistance;+2 for physical assistance;+2 for safety/equipment;Ambulation;RW   Toileting- Clothing Manipulation and Hygiene: Moderate assistance;Cueing for safety;Sit to/from stand;Sitting/lateral lean       Functional mobility during ADLs: Minimal assistance;+2 for physical assistance;+2 for safety/equipment General ADL Comments: Pt set-upA for UB ADL and modA for LB ADL. Pt with very supportive family and will have great support at home. Pt ambulating today with RW.     Vision Baseline Vision/History: Wears glasses  Wears Glasses: At all times Vision Assessment?: No apparent visual deficits     Perception     Praxis      Pertinent Vitals/Pain Pain Assessment: 0-10 Pain Score: 6  Pain  Location: abdomen, sacral region, LE swelling  Pain Descriptors / Indicators: Aching;Throbbing;Sharp;Shooting Pain Intervention(s): Limited activity within patient's tolerance     Hand Dominance Right   Extremity/Trunk Assessment Upper Extremity Assessment Upper Extremity Assessment: Generalized weakness   Lower Extremity Assessment Lower Extremity Assessment: Generalized weakness   Cervical / Trunk Assessment Cervical / Trunk Assessment: Normal   Communication Communication Communication: No difficulties   Cognition Arousal/Alertness: Awake/alert Behavior During Therapy: WFL for tasks assessed/performed Overall Cognitive Status: Within Functional Limits for tasks assessed                                     General Comments  edema in LLE >RLE    Exercises     Shoulder Instructions      Home Living Family/patient expects to be discharged to:: Private residence Living Arrangements: Spouse/significant other Available Help at Discharge: Friend(s);Available 24 hours/day Type of Home: House Home Access: Stairs to enter [employer] of Steps: 1 Entrance Stairs-Rails: None Home Layout: Two level;Bed/bath upstairs;Able to live on main level with bedroom/bathroom           Bathroom Accessibility: Yes   Home Equipment: None          Prior Functioning/Environment Level of Independence: Needs assistance  Gait / Transfers Assistance Needed: 1 month ago, independent in household distance ambulation, last 2 weeks physical assist for transport to car for appointments, and very limited household ambulation ADL's / Homemaking Assistance Needed: 1 month ago independent with bathing and dressing, last 2 weeks requires assist            OT Problem List: Decreased strength;Decreased activity tolerance;Impaired balance (sitting and/or standing);Pain      OT Treatment/Interventions: Self-care/ADL training;Therapeutic exercise;Energy conservation;DME  and/or AE instruction;Therapeutic activities;Patient/family education;Balance training    OT Goals(Current goals can be found in the care plan section) Acute Rehab OT Goals Patient Stated Goal: get up walking again OT Goal Formulation: With patient Time For Goal Achievement: 06/27/19 Potential to Achieve Goals: Good ADL Goals Pt Will Perform Lower Body Dressing: with min assist;sitting/lateral leans;with adaptive equipment;sit to/from stand Pt Will Transfer to Toilet: with min guard assist;ambulating;bedside commode Pt Will Perform Toileting - Clothing Manipulation and hygiene: with min guard assist;sitting/lateral leans;sit to/from stand  OT Frequency: Min 1X/week   Barriers to D/C:            Co-evaluation              AM-PAC OT "6 Clicks" Daily Activity     Outcome Measure Help from another person eating meals?: None Help from another person taking care of personal grooming?: None Help from another person toileting, which includes using toliet, bedpan, or urinal?: A Lot Help from another person bathing (including washing, rinsing, drying)?: A Lot Help from another person to put on and taking off regular upper body clothing?: A Little Help from another person to put on and taking off regular lower body clothing?: A Lot 6 Click Score: 17   End of Session Equipment Utilized During Treatment: Gait belt;Rolling walker Nurse Communication: Mobility status  Activity Tolerance: Patient tolerated treatment well Patient left: in bed;with call bell/phone within reach;with nursing/sitter in room;with family/visitor present  OT Visit  Diagnosis: Unsteadiness on feet (R26.81);Muscle weakness (generalized) (M62.81);Pain Pain - part of body: (bottom)                Time: [id number] OT Time Calculation (min): 36 min Charges:  OT General Charges $OT Visit: 1 Visit OT Evaluation $OT Eval Moderate Complexity: 1 Mod  Darryl Nestle) Marsa Aris OTR/L Acute Rehabilitation Services Pager:  [phone] Office: [place] 06/13/2019, 3:34 PM

## 2019-06-13 NOTE — Progress Notes (Signed)
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 Pt intubated and requiring vasopressor support and transfused with 2 units of PRBC.  Transferred from [facility] to [facility] for higher level of care. 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 Pt is limited in safe mobility by pain in presence of decreased strength and balance. Pt requires min A for bed mobility, minax2 for transfers and min-modAx2 for ambulation of 12 feet with RW. PT recommends HHPT to build strength and balance. PT will continue to follow acutely.    Follow Up Recommendations  Home health PT;Supervision/Assistance - 24 hour     Equipment Recommendations  Rolling walker with 5" wheels    Recommendations for Other Services OT consult     Precautions / Restrictions Precautions Precautions: Fall Precaution Comments: 1 bottom drain Restrictions Weight Bearing Restrictions: No    Mobility  Bed Mobility Overal bed mobility: Needs Assistance Bed Mobility: Rolling;Sidelying to Sit;Sit to Supine;Sit to Sidelying Rolling: Min assist Sidelying to sit: Min assist   Sit to supine: Min assist Sit to sidelying: Min assist General bed mobility comments: minA due to eleavtion of trunk and BLE management  Transfers Overall transfer level: Needs assistance Equipment used: Rolling walker (2  wheeled) Transfers: Sit to/from Stand Sit to Stand: Min assist;+2 safety/equipment         General transfer comment: +2 for safe ambulation due to unsteadiness with gait with LOB epsiodes requiring assist to steady pt as pt with limited righting reactions.  Ambulation/Gait Ambulation/Gait assistance: Min assist;Mod assist Gait Distance (Feet): 12 Feet Assistive device: Rolling walker (2 wheeled) Gait Pattern/deviations: Step-through pattern;Decreased step length - right;Decreased step length - left;Antalgic Gait velocity: slowed Gait velocity interpretation: <1.31 ft/sec, indicative of household ambulator General Gait Details: +2 for management of lines and pt, minA for straight line ambulation, modA for LoB with turning with RW, pt with drainage requiring cleaning x2 with ambulation        Balance Overall balance assessment: Needs assistance   Sitting balance-Leahy Scale: Good       Standing balance-Leahy Scale: Poor Standing balance comment:  pt reliant on external support                            Cognition Arousal/Alertness: Awake/alert Behavior During Therapy: WFL for tasks assessed/performed Overall Cognitive Status: Within Functional Limits for tasks assessed                                           General Comments General comments (skin integrity, edema, etc.): edema in LLE >RLE      Pertinent Vitals/Pain Pain Assessment: 0-10 Pain Score: 6  Pain Location: abdomen, sacral region, LE swelling  Pain Descriptors / Indicators: Aching;Throbbing;Sharp;Shooting Pain Intervention(s): Limited activity within patient's tolerance;Monitored  during session;Repositioned    Home Living Family/patient expects to be discharged to:: Private residence Living Arrangements: Spouse/significant other Available Help at Discharge: Friend(s);Available 24 hours/day Type of Home: House Home Access: Stairs to enter Entrance Stairs-Rails: None Home  Layout: Two level;Bed/bath upstairs;Able to live on main level with bedroom/bathroom Home Equipment: None      Prior Function Level of Independence: Needs assistance  Gait / Transfers Assistance Needed: 1 month ago, independent in household distance ambulation, last 2 weeks physical assist for transport to car for appointments, and very limited household ambulation ADL's / Homemaking Assistance Needed: 1 month ago independent with bathing and dressing, last 2 weeks requires assist     PT Goals (current goals can now be found in the care plan section) Acute Rehab PT Goals Patient Stated Goal: get up walking again PT Goal Formulation: With patient/family Time For Goal Achievement: 06/26/19 Potential to Achieve Goals: Good Progress towards PT goals: Progressing toward goals    Frequency    Min 3X/week      PT Plan Equipment recommendations need to be updated;Current plan remains appropriate       AM-PAC PT "6 Clicks" Mobility   Outcome Measure  Help needed turning from your back to your side while in a flat bed without using bedrails?: None Help needed moving from lying on your back to sitting on the side of a flat bed without using bedrails?: A Little Help needed moving to and from a bed to a chair (including a wheelchair)?: A Little Help needed standing up from a chair using your arms (e.g., wheelchair or bedside chair)?: A Little Help needed to walk in hospital room?: A Lot Help needed climbing 3-5 steps with a railing? : Total 6 Click Score: 16    End of Session Equipment Utilized During Treatment: Gait belt Activity Tolerance: Patient limited by pain Patient left: in bed;with call bell/phone within reach;with family/visitor present Nurse Communication: Mobility status PT Visit Diagnosis: Other abnormalities of gait and mobility (R26.89);Muscle weakness (generalized) (M62.81);Difficulty in walking, not elsewhere classified (R26.2);Pain Pain - part of body: (sacrum  abdomen)     Time: [id number] PT Time Calculation (min) (ACUTE ONLY): 38 min  Charges:  $Gait Training: 8-22 mins                     Willard Madrigal B. Migdalia Dk PT, DPT Acute Rehabilitation Services Pager [phone] Office [phone]    [place] 06/13/2019, 5:42 PM

## 2019-06-13 NOTE — Progress Notes (Signed)
Pharmacy Antibiotic Note  Jonathan Wright is a 53 y.o. male with metastatic rectal CA and recurrent pelvic abscesses admitted on 06/07/2019 with GIB and septic shock. Pharmacy has been consulted to dose Meropenem - day #6. Also continuing on vancomycin PO for Cdiff. Linezolid and Flagyl IV have been d/c'd.  Plan: Meropenem 2g IV every 8 hours Vancomycin 500mg  PO Q6h Follow renal function, culture results, LOT, and antibiotic de-escalation plans    Height: 6' (182.9 cm) Weight: 178 lb 2.1 oz (80.8 kg) IBW/kg (Calculated) : 77.6  Temp (24hrs), Avg:99.1 F (37.3 C), Min:98.8 F (37.1 C), Max:99.6 F (37.6 C)  Recent Labs  Lab 06/07/19 1913 06/07/19 2142  06/08/19 0748  06/08/19 1539  06/09/19 0406 06/09/19 1023 06/10/19 0419 06/12/19 0425 06/13/19 0505  WBC 53.8*  --    < >  --    < > 37.6*   < > 31.0* 33.6* 32.9* 20.0* 15.2*  CREATININE 1.74*  --    < >  --   --  1.41*  --  1.21  --  1.04 0.91 0.77  LATICACIDVEN 5.3* 5.0*  --  1.5  --   --   --   --   --   --   --   --    < > = values in this interval not displayed.    Estimated Creatinine Clearance: 118.6 mL/min (by C-G formula based on SCr of 0.77 mg/dL).    No Known Allergies  Antimicrobials this admission: Vanc IV 10/17 x 1 Zosyn 10/17 >> 10/18 Meropenem 10/18 >> Vanc PO 10/18 >> Flagyl IV 10/18>>10/22  Microbiology results: 10/17 COVID >> neg 10/17 MRSA PCR >> neg 10/17 JP drain >> abundant kleb pneumo, E.faecium, Ecoli 10/17 CDiff >> antigen +, toxin - >> PCR positive - start Vanc po PT 10/17 BCx >> negative   Elicia Lamp, PharmD, BCPS Please check AMION for all [place] contact numbers Clinical Pharmacist 06/13/2019 11:05 AM

## 2019-06-13 NOTE — Progress Notes (Signed)
Daily Progress Note   Patient Name: Jonathan Wright       Date: 06/13/2019 DOB: 01-02-1966  Age: 53 y.o. MRN#: [id number] Attending Physician: Hosie Poisson, MD Primary Care Physician: Deland Pretty, MD Admit Date: 06/07/2019  Reason for Consultation/Follow-up: Pain control  Subjective: I met today with Jonathan Wright.    He was in much better spirits and engaged in conversation today.  Reports pain "much better" since changing fentanyl patches.  We reviewed his breakthrough usage that included oxycodone 92m x 8 doses and a total of 149mof IV dilaudid.   Length of Stay: 6  Current Medications: Scheduled Meds:  . sodium chloride   Intravenous Once  . chlorhexidine gluconate (MEDLINE KIT)  15 mL Mouth Rinse BID  . Chlorhexidine Gluconate Cloth  6 each Topical Daily  . feeding supplement (ENSURE ENLIVE)  237 mL Oral BID BM  . fentaNYL  1 patch Transdermal Q48H   And  . fentaNYL  1 patch Transdermal Q48H   And  . fentaNYL  1 patch Transdermal Q48H  . furosemide  20 mg Intravenous BID  . HYDROmorphone  16 mg Oral Q3H  . multivitamin with minerals  1 tablet Oral Daily  . pantoprazole  40 mg Oral BID  . prochlorperazine  10 mg Intravenous Q6H  . vancomycin  500 mg Oral Q6H    Continuous Infusions: . sodium chloride Stopped (06/11/19 1424)  . meropenem (MERREM) IV 2 g (06/13/19 2138)    PRN Meds: ALPRAZolam, HYDROmorphone (DILAUDID) injection  Physical Exam         General: Alert and interactive. Chronically ill appearing Heart: Regular rate and rhythm. No murmur appreciated. Lungs: Good air movement, clear Abdomen: Soft, ileostomy, urostomy, and pelvic drain noted.  Ext: + edema Skin: Warm and dry Neuro: Grossly intact, nonfocal.   Vital Signs: BP 101/72   Pulse 82    Temp 98.1 F (36.7 C) (Oral)   Resp 16   Ht 6' (1.829 m)   Wt 80.8 kg   SpO2 97%   BMI 24.16 kg/m  SpO2: SpO2: 97 % O2 Device: O2 Device: Room Air O2 Flow Rate: O2 Flow Rate (L/min): 3 L/min  Intake/output summary:   Intake/Output Summary (Last 24 hours) at 06/13/2019 2209 Last data filed at 06/13/2019 1800 Gross per 24 hour  Intake  857 ml  Output 4383 ml  Net -3526 ml   LBM: Last BM Date: 06/13/19 Baseline Weight: Weight: 74 kg Most recent weight: Weight: 80.8 kg       Palliative Assessment/Data:      Patient Active Problem List   Diagnosis Date Noted  . Pseudoaneurysm ([place])   . Other pulmonary embolism without acute cor pulmonale ([place])   . Respiratory arrest ([place]) 06/07/2019  . Goals of care, counseling/discussion 10/11/2018  . Lymphadenopathy, inguinal   . Sepsis ([place]) 06/19/2016  . UTI (urinary tract infection) 06/19/2016  . Perirectal abscess 06/19/2016  . Severe anemia 06/19/2016  . Hyponatremia 06/19/2016  . Depression with anxiety 06/19/2016  . Rectal bleeding 06/19/2016  . Fever of unknown origin (FUO) 06/19/2016  . Sepsis, unspecified organism ([place]) 06/19/2016  . S/P PICC central line placement 01/13/2016  . Rectal cancer ([place]) 12/17/2015  . Internal hemorrhoids 12/10/2013    Palliative Care Assessment & Plan   Patient Profile: 53 y.o. male  with past medical history of depression, metastatic rectal cancer currently followed by Dr. 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 and pelvic exenteration at [facility] admitted on 06/07/2019 with sepsis, DVT, subsegmental PE, acute respiratory failure requiring intubation (now extubated).  Palliative care consulted for pain management.    Assessment: Patient Active Problem List   Diagnosis Date Noted  . Pseudoaneurysm ([place])   . Other pulmonary embolism without acute cor pulmonale ([place])   . Respiratory arrest ([place]) 06/07/2019   . Goals of care, counseling/discussion 10/11/2018  . Lymphadenopathy, inguinal   . Sepsis ([place]) 06/19/2016  . UTI (urinary tract infection) 06/19/2016  . Perirectal abscess 06/19/2016  . Severe anemia 06/19/2016  . Hyponatremia 06/19/2016  . Depression with anxiety 06/19/2016  . Rectal bleeding 06/19/2016  . Fever of unknown origin (FUO) 06/19/2016  . Sepsis, unspecified organism ([place]) 06/19/2016  . S/P PICC central line placement 01/13/2016  . Rectal cancer ([place]) 12/17/2015  . Internal hemorrhoids 12/10/2013     Recommendations/Plan:  Pain: He appears to be better on exam today.  His partner reports that his pain seems to worsen after 48 hours on fentanyl patch and that is consistent with how he has improved today.  Has still been getting PO oxycodone and IV dilaudid regularly.  Note plan for discharge soon and we discussed needing to be off of IV opioids.  Discussed options of increasing oxycodone to 30m vs rotation of rescue opioid to dilaudid PO.  He would like to see if his pain is better controlled on PO dilaudid.  Stop oxycodone and start dilaudid 18mQ 3 hours.  He expressed preference for scheduling and declining rather than prn.  Anxiety/poor sleep: Uses alprazolam as needed at home.  Continue alprazolam as needed at night for sleep.  Goals of Care and Additional Recommendations:  Limitations on Scope of Treatment: Full Scope Treatment  Code Status:    Code Status Orders  (From admission, onward)         Start     Ordered   06/07/19 1855  Full code  Continuous     06/07/19 1857        Code Status History    Date Active Date Inactive Code Status Order ID Comments User Context   06/19/2016 2054 06/23/2016 1309 Full Code [id number] BullsMD ED   Advance Care Planning Activity       Prognosis:   Unable to determine  Discharge  Planning:  To Be Determined  Care plan was discussed with patient, significant other  Thank you for allowing the  Palliative Medicine Team to assist in the care of this patient.   Time In: 1600 Time Out: 1640 Total Time 40 Prolonged Time Billed  No       Greater than 50%  of this time was spent counseling and coordinating care related to the above assessment and plan.  Micheline Rough, MD  Please contact Palliative Medicine Team phone at [phone] for questions and concerns.

## 2019-06-13 NOTE — TOC Initial Note (Signed)
Transition of Care [facility]) - Initial/Assessment Note    Patient Details  Name: Jonathan Wright MRN: [id number] Date of Birth: April 17, 1966  Transition of Care [facility]) CM/SW Contact:    Jonathan Crews, RN Phone Number: [phone] 06/13/2019, 1:22 PM  Clinical Narrative:                 Spoke with patient at the bedside. PTA home with significant other who is well versed with patient's care needs. Discussed recommendations for [facility] PT, RN - in agreement. Referral placed to [facility] - pending acceptance. Patient will require PTAR transport at discharge d/t unable to sit up without extreme pain. TOC following for transition of care needs.   Expected Discharge Plan: [place]     Patient Goals and CMS Choice Patient states their goals for this hospitalization and ongoing recovery are:: return home CMS Medicare.gov Compare Post Acute Care list provided to:: Patient Choice offered to / list presented to : Patient  Expected Discharge Plan and Services Expected Discharge Plan: [place]   Discharge Planning Services: CM Consult Post Acute Care Choice: [place] arrangements for the past 2 months: Single Family Home                 DME Arranged: [name] DME Agency: NA       HH Arranged: RN, PT          Prior Living Arrangements/Services Living arrangements for the past 2 months: Single Family Home Lives with:: Self, Significant Other Patient language and need for interpreter reviewed:: Yes Do you feel safe going back to the place where you live?: Yes      Need for Family Participation in Patient Care: Yes (Comment) Care giver support system in place?: Yes (comment) Current home services: DME Criminal Activity/Legal Involvement Pertinent to Current Situation/Hospitalization: No - Comment as needed  Activities of Daily Living      Permission Sought/Granted Permission sought to share information with : Family Supports Permission granted  to share information with : Yes, Verbal Permission Granted  Share Information with NAME: Jonathan Wright     Permission granted to share info w Relationship: significant other  Permission granted to share info w Contact Information: [phone]  Emotional Assessment Appearance:: Appears stated age Attitude/Demeanor/Rapport: Engaged Affect (typically observed): Accepting Orientation: : Oriented to Situation, Oriented to  Time, Oriented to Place, Oriented to Self Alcohol / Substance Use: Not Applicable Psych Involvement: No (comment)  Admission diagnosis:  Respiratory arrest ([place]) [R09.2] Severe anemia [D64.9] Patient Active Problem List   Diagnosis Date Noted  . Pseudoaneurysm ([place])   . Other pulmonary embolism without acute cor pulmonale ([place])   . Respiratory arrest ([place]) 06/07/2019  . Goals of care, counseling/discussion 10/11/2018  . Lymphadenopathy, inguinal   . Sepsis ([place]) 06/19/2016  . UTI (urinary tract infection) 06/19/2016  . Perirectal abscess 06/19/2016  . Severe anemia 06/19/2016  . Hyponatremia 06/19/2016  . Depression with anxiety 06/19/2016  . Rectal bleeding 06/19/2016  . Fever of unknown origin (FUO) 06/19/2016  . Sepsis, unspecified organism ([place]) 06/19/2016  . S/P PICC central line placement 01/13/2016  . Rectal cancer ([place]) 12/17/2015  . Internal hemorrhoids 12/10/2013   PCP:  Deland Pretty, MD Pharmacy:   [place], [place] [address] AT [place] [place] [place] [address] Phone: [phone] Fax: [place],  [place] [address] Phone: [phone] Fax: [phone]  [place], [place] [id number] [place] [address] Phone: [phone] Fax: [phone]     Social Determinants of Health (SDOH) Interventions     Readmission Risk Interventions No flowsheet data found.

## 2019-06-14 LAB — PROTIME-INR
INR: 1.3 — ABNORMAL HIGH (ref 0.8–1.2)
Prothrombin Time: 15.8 seconds — ABNORMAL HIGH (ref 11.4–15.2)

## 2019-06-14 LAB — HEMOGLOBIN AND HEMATOCRIT, BLOOD
HCT: 26.2 % — ABNORMAL LOW (ref 39.0–52.0)
Hemoglobin: 8.2 g/dL — ABNORMAL LOW (ref 13.0–17.0)

## 2019-06-14 LAB — BASIC METABOLIC PANEL
Anion gap: 10 (ref 5–15)
BUN: 7 mg/dL (ref 6–20)
CO2: 26 mmol/L (ref 22–32)
Calcium: 6.2 mg/dL — CL (ref 8.9–10.3)
Chloride: 103 mmol/L (ref 98–111)
Creatinine, Ser: 0.55 mg/dL — ABNORMAL LOW (ref 0.61–1.24)
GFR calc Af Amer: >60 mL/min (ref >60)
GFR calc non Af Amer: >60 mL/min (ref >60)
Glucose, Bld: 133 mg/dL — ABNORMAL HIGH (ref 70–99)
Potassium: 3.1 mmol/L — ABNORMAL LOW (ref 3.5–5.1)
Sodium: 139 mmol/L (ref 135–145)

## 2019-06-14 LAB — GLUCOSE, CAPILLARY: Glucose-Capillary: 114 mg/dL — ABNORMAL HIGH (ref 70–99)

## 2019-06-14 MED ORDER — OXYCODONE HCL 5 MG PO TABS
60.0000 mg | ORAL_TABLET | ORAL | Status: DC
  Administered 2019-06-14 – 2019-06-17 (×26): 60 mg via ORAL
  Filled 2019-06-14 (×26): qty 12

## 2019-06-14 MED ORDER — FUROSEMIDE 10 MG/ML IJ SOLN
20.0000 mg | Freq: Every day | INTRAMUSCULAR | Status: DC
  Administered 2019-06-15 – 2019-06-16 (×2): 20 mg via INTRAVENOUS
  Filled 2019-06-14 (×2): qty 2

## 2019-06-14 MED ORDER — CHLORHEXIDINE GLUCONATE 0.12 % MT SOLN
OROMUCOSAL | Status: AC
  Filled 2019-06-14: qty 15

## 2019-06-14 NOTE — Progress Notes (Signed)
Received page about critically low potassium of 6.2 corrected calcium at 8.4. Will recheck with am labs.   Lovey Newcomer, NP [facility] 7p-7a [phone]

## 2019-06-14 NOTE — Progress Notes (Signed)
PROGRESS NOTE    Jonathan Wright  IWO:[id number] DOB: 07-17-1966 DOA: 06/07/2019 PCP: Deland Pretty, MD    Brief Narrative:   53 year old gentleman with prior history of metastatic stage IIIb rectal cancer s/p diverting ileostomy with [name] follows up with oncology Dr. Delton Coombes at [facility] with most recent chemotherapy on April 23, 2019, was found to be hypoxic and hypotensive severely anemic ,  sepsis, with acute PE AKI.  He was transferred from [facility] to [facility] and admitted by [facility] for further management.  In September 2020 he was found to have foul-smelling discharge leaking from the sites of the draining tubes, 1 placed by IR at [facility], another one placed at [facility] in July 2020.  He was also found to have a urostomy and a colostomy placed in 2019 with multiple revisions and surgeries.  He underwent flap dissection on the right inner thigh in 2019 that continues to drain.  He was admitted by [facility] for sepsis and transferred to [facility] on 06/11/2019. He has completed 7 days of IV meropenem and continues to be on oral vancomycin to complete the course of C. difficile. His pain regimen is being managed by Dr. Domingo Cocking with palliative care.  Plan to discharge the patient with home care services once they are arranged.  Assessment & Plan:   Active Problems:   Sepsis ([place])   Severe anemia   Respiratory arrest ([place])   Pseudoaneurysm ([place])   Other pulmonary embolism without acute cor pulmonale (HCC)   Sepsis with hemorrhagic shock secondary to recurrent pelvic abscesses Resolved. Pelvic abscess cultures revealed E. coli, Klebsiella, Enterobacter sensitive to meropenem. No fever overnight and leukocytosis has improved to 15,000.  Completed 7 days of IV meropenem and plan for discharge home with home care services once they are arranged.  Acute left common femoral DVT and segmental pulmonary emboli S/p IR embolization on 06/08/2019.  S/p IVC filter/2018. Patient denies  any chest pain or shortness of breath.  Acute blood loss anemia secondary to suspected pseudoaneurysm versus GI bleed Transfuse to keep hemoglobin greater than 7.  Continue with PPI twice daily. Hemoglobin stable around 9.   Recurrent metastatic rectal cancer s/p pelvic exenteration, longstanding chronic pelvic abscesses with fistulization, s/p pelvic radiation with JP drain in place Upsized IR drain. Recommend outpatient follow-up at [facility] He is able to tolerate regular diet without any nausea, vomiting.  Abdominal pain controlled with 300 MCG of fentanyl and 40 mg of oxycodone every 3 hours.  Appreciate palliative care input. Appreciate pain management by palliative care.  AKI probably secondary to hemorrhagic shock Resolved  Currently waiting for PT eval and possibly home with home health PT once his pain is optimally controlled.  And home health services are arranged  Mild hyponatremia:  Possibly from fluid overload.  Resolved with IV Lasix  C diff infection? Toxigenic C difficile PCR is positive- on oral vancomycin and  complete the course  DVT prophylaxis: SCDs Code Status: Full code Family Communication: None at bedside Disposition Plan: DC home when pain is optimally controlled and home health services are arranged.   Consultants:   General surgery  IR  Procedures: (Upsizing of the pelvic drain by IR on 06/10/2019 ETT 10/17 extubated on 10/18 CT angio of the chest show segmental pulmonary embolism.   Antimicrobials:oral vancomycin for  c diff and Iv meropenam for klebsiella .  Subjective: Patient appears to be in good spirits.  Denies he is having trouble with Dilaudid and would  like to switch to oral oxycodone. Objective: Vitals:   06/14/19 0737 06/14/19 0800 06/14/19 1147 06/14/19 1621  BP:  111/70    Pulse:      Resp:      Temp: 99 F (37.2 C)  98.7 F (37.1 C) 99.2 F (37.3 C)  TempSrc: Oral  Oral Oral  SpO2:      Weight:      Height:         Intake/Output Summary (Last 24 hours) at 06/14/2019 1700 Last data filed at 06/14/2019 1000 Gross per 24 hour  Intake 199.94 ml  Output 6478 ml  Net -6278.06 ml   Filed Weights   06/08/19 0427 06/09/19 0500 06/13/19 0500  Weight: 73.3 kg 79.9 kg 80.8 kg    Examination:  General exam: Alert and comfortable Respiratory system: Air entry fair bilateral, no wheezing or rhonchi Cardiovascular system: S1-S2 heard, regular rate rhythm Gastrointestinal system: Abdomen is soft, nontender, nondistended bowel sounds are adequate. Ileostomy and urostomy in place with pelvic drain.   Central nervous system: alert and oriented.  Extremities: 2+ leg edema.  Skin: no rashes.  Psychiatry: mood is appropriate.     Data Reviewed: I have personally reviewed following labs and imaging studies  CBC: Recent Labs  Lab 06/07/19 1913  06/09/19 0406 06/09/19 1023 06/10/19 0419 06/12/19 0425 06/13/19 0505  WBC 53.8*   < > 31.0* 33.6* 32.9* 20.0* 15.2*  NEUTROABS 51.6*  --   --   --   --   --  12.0*  HGB 7.3*   < > 8.3* 8.8* 8.9* 8.6* 9.2*  HCT 20.8*   < > 24.5* 25.9* 26.9* 27.1* 29.4*  MCV 92.9   < > 88.4 90.2 91.8 93.8 94.2  PLT 388   < > 208 193 234 296 314   < > = values in this interval not displayed.   Basic Metabolic Panel: Recent Labs  Lab 06/07/19 1913 06/08/19 0306  06/08/19 1539 06/09/19 0017 06/09/19 0406 06/10/19 0419 06/12/19 0425 06/13/19 0505  NA 133* 139   < > 140 139 141 135 133* 136  K 4.1 3.7   < > 3.6 3.4* 3.2* 3.8 3.6 3.9  CL 97* 103  --  110  --  113* 108 106 104  CO2 20* 22  --  21*  --  20* 20* 21* 23  GLUCOSE 285* 124*  --  127*  --  107* 111* 132* 92  BUN 29* 25*  --  21*  --  20 17 8 7   CREATININE 1.74* 1.56*  --  1.41*  --  1.21 1.04 0.91 0.77  CALCIUM 6.9* 6.6*  --  6.2*  --  7.0* 7.3* 7.0* 7.4*  MG 1.9 1.8  --   --   --  2.0 2.0  --   --   PHOS 4.7* 3.4  --   --   --  3.3 2.1*  --   --    < > = values in this interval not displayed.   GFR:  Estimated Creatinine Clearance: 118.6 mL/min (by C-G formula based on SCr of 0.77 mg/dL). Liver Function Tests: Recent Labs  Lab 06/07/19 1913 06/08/19 0306 06/08/19 1539 06/09/19 0406 06/13/19 0505  AST 32 30 22 18 21   ALT 14 16 15 14 13   ALKPHOS 130* 123 106 105 107  BILITOT 1.6* 1.3* 1.0 0.7 0.4  PROT 4.1* 4.0* 3.7* 3.8* 4.3*  ALBUMIN 1.3* 1.2* 1.1* 1.1* 1.2*   No results for  input(s): LIPASE, AMYLASE in the last 168 hours. No results for input(s): AMMONIA in the last 168 hours. Coagulation Profile: Recent Labs  Lab 06/10/19 0419 06/11/19 0500 06/12/19 0425 06/13/19 0505 06/14/19 0418  INR 1.5* 1.4* 1.2 1.3* 1.3*   Cardiac Enzymes: Recent Labs  Lab 06/11/19 0500 06/11/19 1700 06/12/19 0425 06/12/19 1706 06/13/19 0505  CKTOTAL 26* 89 108 44* 41*   BNP (last 3 results) No results for input(s): PROBNP in the last 8760 hours. HbA1C: No results for input(s): HGBA1C in the last 72 hours. CBG: Recent Labs  Lab 06/12/19 1144 06/12/19 1536 06/12/19 1946 06/12/19 2335 06/13/19 0334  GLUCAP 82 97 114* 91 94   Lipid Profile: No results for input(s): CHOL, HDL, LDLCALC, TRIG, CHOLHDL, LDLDIRECT in the last 72 hours. Thyroid Function Tests: No results for input(s): TSH, T4TOTAL, FREET4, T3FREE, THYROIDAB in the last 72 hours. Anemia Panel: No results for input(s): VITAMINB12, FOLATE, FERRITIN, TIBC, IRON, RETICCTPCT in the last 72 hours. Sepsis Labs: Recent Labs  Lab 06/07/19 1913 06/07/19 2142 06/08/19 0748  LATICACIDVEN 5.3* 5.0* 1.5    Recent Results (from the past 240 hour(s))  Blood culture (routine x 2)     Status: None   Collection Time: 06/07/19  3:24 PM   Specimen: BLOOD LEFT HAND  Result Value Ref Range Status   Specimen Description   Final    BLOOD LEFT HAND BOTTLES DRAWN AEROBIC AND ANAEROBIC   Special Requests Blood Culture adequate volume  Final   Culture   Final    NO GROWTH 5 DAYS Performed at [facility], [address]  [place], [place] [address]    Report Status 06/12/2019 FINAL  Final  Blood culture (routine x 2)     Status: None   Collection Time: 06/07/19  3:31 PM   Specimen: BLOOD RIGHT HAND  Result Value Ref Range Status   Specimen Description BLOOD RIGHT HAND  Final   Special Requests   Final    BOTTLES DRAWN AEROBIC ONLY Blood Culture adequate volume   Culture   Final    NO GROWTH 5 DAYS Performed at [facility], [address], [place], [place] [address]    Report Status 06/12/2019 FINAL  Final  SARS Coronavirus 2 by RT PCR (hospital order, performed in [facility] hospital lab) Nasopharyngeal Nasopharyngeal Swab     Status: None   Collection Time: 06/07/19  3:50 PM   Specimen: Nasopharyngeal Swab  Result Value Ref Range Status   SARS Coronavirus 2 NEGATIVE NEGATIVE Final    Comment: (NOTE) If result is NEGATIVE SARS-CoV-2 target nucleic acids are NOT DETECTED. The SARS-CoV-2 RNA is generally detectable in upper and lower  respiratory specimens during the acute phase of infection. The lowest  concentration of SARS-CoV-2 viral copies this assay can detect is 250  copies / mL. A negative result does not preclude SARS-CoV-2 infection  and should not be used as the sole basis for treatment or other  patient management decisions.  A negative result may occur with  improper specimen collection / handling, submission of specimen other  than nasopharyngeal swab, presence of viral mutation(s) within the  areas targeted by this assay, and inadequate number of viral copies  (<250 copies / mL). A negative result must be combined with clinical  observations, patient history, and epidemiological information. If result is POSITIVE SARS-CoV-2 target nucleic acids are DETECTED. The SARS-CoV-2 RNA is generally detectable in upper and lower  respiratory specimens dur ing the acute phase of infection.  Positive  results are indicative of active infection with SARS-CoV-2.  Clinical  correlation with patient  history and other diagnostic information is  necessary to determine patient infection status.  Positive results do  not rule out bacterial infection or co-infection with other viruses. If result is PRESUMPTIVE POSTIVE SARS-CoV-2 nucleic acids MAY BE PRESENT.   A presumptive positive result was obtained on the submitted specimen  and confirmed on repeat testing.  While 2019 novel coronavirus  (SARS-CoV-2) nucleic acids may be present in the submitted sample  additional confirmatory testing may be necessary for epidemiological  and / or clinical management purposes  to differentiate between  SARS-CoV-2 and other Sarbecovirus currently known to infect humans.  If clinically indicated additional testing with an alternate test  methodology [id number]) is advised. The SARS-CoV-2 RNA is generally  detectable in upper and lower respiratory sp ecimens during the acute  phase of infection. The expected result is Negative. Fact Sheet for Patients:  StrictlyIdeas.no Fact Sheet for Healthcare Providers: BankingDealers.co.za This test is not yet approved or cleared by the [place] FDA and has been authorized for detection and/or diagnosis of SARS-CoV-2 by FDA under an Emergency Use Authorization (EUA).  This EUA will remain in effect (meaning this test can be used) for the duration of the COVID-19 declaration under Section 564(b)(1) of the Act, 21 U.S.C. section 360bbb-3(b)(1), unless the authorization is terminated or revoked sooner. Performed at [facility], [address] [place], [place] [address]   MRSA PCR Screening     Status: None   Collection Time: 06/07/19  6:01 PM   Specimen: Nasopharyngeal  Result Value Ref Range Status   MRSA by PCR NEGATIVE NEGATIVE Final    Comment:        The GeneXpert MRSA Assay (FDA approved for NASAL specimens only), is one component of a comprehensive MRSA colonization surveillance program. It is not  intended to diagnose MRSA infection nor to guide or monitor treatment for MRSA infections. Performed at [facility], [place] [address], [place], [place] [address]   C difficile quick scan w PCR reflex     Status: Abnormal   Collection Time: 06/07/19  8:24 PM   Specimen: STOOL  Result Value Ref Range Status   C Diff antigen POSITIVE (A) NEGATIVE Final   C Diff toxin NEGATIVE NEGATIVE Final   C Diff interpretation Results are indeterminate. See PCR results.  Final    Comment: Performed at [facility], [place] [address], [place], [place] [address]  C. Diff by PCR, Reflexed     Status: Abnormal   Collection Time: 06/07/19  8:24 PM  Result Value Ref Range Status   Toxigenic C. Difficile by PCR POSITIVE (A) NEGATIVE Final    Comment: Positive for toxigenic C. difficile with little to no toxin production. Only treat if clinical presentation suggests symptomatic illness. Performed at [facility], [place] [address], [place], [place] [address]   Aerobic/Anaerobic Culture (surgical/deep wound)     Status: None   Collection Time: 06/07/19  8:35 PM   Specimen: Fluid; Other  Result Value Ref Range Status   Specimen Description FLUID  Final   Special Requests JP DRAIN FLUID FROM RIGHT BUTTOCK  Final   Gram Stain   Final    RARE WBC PRESENT, PREDOMINANTLY PMN ABUNDANT GRAM NEGATIVE RODS FEW GRAM POSITIVE COCCI FEW GRAM POSITIVE RODS Performed at [facility], [place] [address], [place], [place] [address]    Culture  Final    ABUNDANT KLEBSIELLA PNEUMONIAE MODERATE ENTEROCOCCUS FAECIUM MODERATE ESCHERICHIA COLI MIXED ANAEROBIC FLORA PRESENT.  CALL LAB IF FURTHER IID REQUIRED.    Report Status 06/11/2019 FINAL  Final   Organism ID, Bacteria KLEBSIELLA PNEUMONIAE  Final   Organism ID, Bacteria ENTEROCOCCUS FAECIUM  Final   Organism ID, Bacteria ESCHERICHIA COLI  Final      Susceptibility   Escherichia coli - MIC*    AMPICILLIN >=32 RESISTANT Resistant     CEFAZOLIN <=4  SENSITIVE Sensitive     CEFEPIME <=1 SENSITIVE Sensitive     CEFTAZIDIME 4 SENSITIVE Sensitive     CEFTRIAXONE <=1 SENSITIVE Sensitive     CIPROFLOXACIN >=4 RESISTANT Resistant     GENTAMICIN <=1 SENSITIVE Sensitive     IMIPENEM <=0.25 SENSITIVE Sensitive     TRIMETH/SULFA >=320 RESISTANT Resistant     AMPICILLIN/SULBACTAM >=32 RESISTANT Resistant     Extended ESBL NEGATIVE Sensitive     * MODERATE ESCHERICHIA COLI   Enterococcus faecium - MIC*    AMPICILLIN <=2 SENSITIVE Sensitive     VANCOMYCIN <=0.5 SENSITIVE Sensitive     GENTAMICIN SYNERGY SENSITIVE Sensitive     * MODERATE ENTEROCOCCUS FAECIUM   Klebsiella pneumoniae - MIC*    AMPICILLIN >=32 RESISTANT Resistant     CEFAZOLIN <=4 SENSITIVE Sensitive     CEFEPIME <=1 SENSITIVE Sensitive     CEFTAZIDIME <=1 SENSITIVE Sensitive     CEFTRIAXONE <=1 SENSITIVE Sensitive     CIPROFLOXACIN 1 SENSITIVE Sensitive     GENTAMICIN <=1 SENSITIVE Sensitive     IMIPENEM <=0.25 SENSITIVE Sensitive     TRIMETH/SULFA >=320 RESISTANT Resistant     AMPICILLIN/SULBACTAM 4 SENSITIVE Sensitive     PIP/TAZO 8 SENSITIVE Sensitive     Extended ESBL NEGATIVE Sensitive     * ABUNDANT KLEBSIELLA PNEUMONIAE         Radiology Studies: No results found.      Scheduled Meds: . sodium chloride   Intravenous Once  . chlorhexidine gluconate (MEDLINE KIT)  15 mL Mouth Rinse BID  . Chlorhexidine Gluconate Cloth  6 each Topical Daily  . feeding supplement (ENSURE ENLIVE)  237 mL Oral BID BM  . fentaNYL  1 patch Transdermal Q48H   And  . fentaNYL  1 patch Transdermal Q48H   And  . fentaNYL  1 patch Transdermal Q48H  . furosemide  20 mg Intravenous BID  . multivitamin with minerals  1 tablet Oral Daily  . oxyCODONE  60 mg Oral Q3H  . pantoprazole  40 mg Oral BID  . vancomycin  500 mg Oral Q6H   Continuous Infusions: . sodium chloride Stopped (06/11/19 1424)  . meropenem (MERREM) IV Stopped (06/14/19 0711)     LOS: 7 days         Hosie Poisson, MD [facility] Pager [phone]  If 7PM-7AM, please contact night-coverage [email or web address] Password [email or web address] 06/14/2019, 5:00 PM

## 2019-06-14 NOTE — Progress Notes (Signed)
Daily Progress Note   Patient Name: Jonathan Wright       Date: 06/14/2019 DOB: [birth year]  Age: 53 y.o. MRN#: [id number] Attending Physician: Hosie Poisson, MD Primary Care Physician: Deland Pretty, MD Admit Date: 06/07/2019  Reason for Consultation/Follow-up: Pain control  Subjective: I met today with Jonathan Wright.  His partner Tarri Fuller was also at the bedside.  He remained in good spirits and engaged in conversation today.  Reports pain "is not too bad today".    Discussed transition from oxycodone to Dilaudid for rescue medication.  He states that he does not feel the Dilaudid works any better than oxycodone and his preference would be to try and see if the oxycodone dose of 60 mg that we discussed yesterday works better than currently ordered p.o. Dilaudid.  I told him that I thought this was reasonable, and then tomorrow we can make a decision if discharge medication for rescue would be either oxycodone 60 mg or Dilaudid 16 mg every 3 hours as needed.  His overall need for IV rescue medication certainly decreased with transition to oral Dilaudid.  If this does go up with rotation back to oxycodone, I told her that I was going to recommend discharge home with the Dilaudid 16 mg p.o. for rescue medication.  We also had another discussion regarding long-term pain management and that he is currently using fentanyl patch with what appears to be better success since transitioning to every 48 hour dosing.  I talked with him about methadone as a possible alternative if he continues to have uncontrolled pain on fentanyl patches.  We discussed the risk versus benefit of this, particularly in light of his slightly prolonged QTC of 459 on most recent ECG.  For now, he would like to continue with plan for  fentanyl 300 mcg/h as his long-acting medication.  We reviewed his breakthrough usage that included Dilaudid 107m x 8 doses and a total of 235mof IV dilaudid.   Length of Stay: 7  Current Medications: Scheduled Meds:  . sodium chloride   Intravenous Once  . chlorhexidine gluconate (MEDLINE KIT)  15 mL Mouth Rinse BID  . Chlorhexidine Gluconate Cloth  6 each Topical Daily  . feeding supplement (ENSURE ENLIVE)  237 mL Oral BID BM  . fentaNYL  1 patch Transdermal Q48H  And  . fentaNYL  1 patch Transdermal Q48H   And  . fentaNYL  1 patch Transdermal Q48H  . furosemide  20 mg Intravenous BID  . multivitamin with minerals  1 tablet Oral Daily  . oxyCODONE  60 mg Oral Q3H  . pantoprazole  40 mg Oral BID  . vancomycin  500 mg Oral Q6H    Continuous Infusions: . sodium chloride Stopped (06/11/19 1424)  . meropenem (MERREM) IV Stopped (06/14/19 0711)    PRN Meds: ALPRAZolam, HYDROmorphone (DILAUDID) injection, prochlorperazine  Physical Exam         General: Alert and interactive. Chronically ill appearing Heart: Regular rate and rhythm. No murmur appreciated. Lungs: Good air movement, clear Abdomen: Soft, ileostomy, urostomy, and pelvic drain noted.  Ext: + edema Skin: Warm and dry Neuro: Grossly intact, nonfocal.   Vital Signs: BP 111/70 (BP Location: Left Arm)   Pulse 85   Temp 99.2 F (37.3 C) (Oral)   Resp 16   Ht 6' (1.829 m)   Wt 80.8 kg   SpO2 96%   BMI 24.16 kg/m  SpO2: SpO2: 96 % O2 Device: O2 Device: Room Air O2 Flow Rate: O2 Flow Rate (L/min): 3 L/min  Intake/output summary:   Intake/Output Summary (Last 24 hours) at 06/14/2019 1629 Last data filed at 06/14/2019 1000 Gross per 24 hour  Intake 319.94 ml  Output 6478 ml  Net -6158.06 ml   LBM: Last BM Date: 06/14/19 Baseline Weight: Weight: 74 kg Most recent weight: Weight: 80.8 kg       Palliative Assessment/Data:      Patient Active Problem List   Diagnosis Date Noted  .  Pseudoaneurysm ([place])   . Other pulmonary embolism without acute cor pulmonale ([place])   . Respiratory arrest ([place]) 06/07/2019  . Goals of care, counseling/discussion 10/11/2018  . Lymphadenopathy, inguinal   . Sepsis ([place]) 06/19/2016  . UTI (urinary tract infection) 06/19/2016  . Perirectal abscess 06/19/2016  . Severe anemia 06/19/2016  . Hyponatremia 06/19/2016  . Depression with anxiety 06/19/2016  . Rectal bleeding 06/19/2016  . Fever of unknown origin (FUO) 06/19/2016  . Sepsis, unspecified organism ([place]) 06/19/2016  . S/P PICC central line placement 01/13/2016  . Rectal cancer ([place]) 12/17/2015  . Internal hemorrhoids 12/10/2013    Palliative Care Assessment & Plan   Patient Profile: 53 y.o. male  with past medical history of depression, metastatic rectal cancer currently followed by Dr. Delton Coombes, chronic pelvic abscess with fistuilization s/p parasacral drain, prior therapy at [facility] and pelvic exenteration at [facility] admitted on 06/07/2019 with sepsis, DVT, subsegmental PE, acute respiratory failure requiring intubation (now extubated).  Palliative care consulted for pain management.    Assessment: Patient Active Problem List   Diagnosis Date Noted  . Pseudoaneurysm ([place])   . Other pulmonary embolism without acute cor pulmonale ([place])   . Respiratory arrest ([place]) 06/07/2019  . Goals of care, counseling/discussion 10/11/2018  . Lymphadenopathy, inguinal   . Sepsis ([place]) 06/19/2016  . UTI (urinary tract infection) 06/19/2016  . Perirectal abscess 06/19/2016  . Severe anemia 06/19/2016  . Hyponatremia 06/19/2016  . Depression with anxiety 06/19/2016  . Rectal bleeding 06/19/2016  . Fever of unknown origin (FUO) 06/19/2016  . Sepsis, unspecified organism ([place]) 06/19/2016  . S/P PICC central line placement 01/13/2016  . Rectal cancer ([place]) 12/17/2015  . Internal hemorrhoids 12/10/2013     Recommendations/Plan:  Pain: His pain remains better  controlled today.  His partner reports that his pain seems  to worsen after 48 hours on fentanyl patch and that is consistent with how he improved following patch change yesterday.  We did a trial of rotation of rescue opioid to Dilaudid last night, but he thinks that he may do better overall with rotation back to oxycodone.  We will therefore plan for trial of oxycodone 60 mg every 3 hours and he will continue to decline dose if he does not need it at that time (he and his partner felt that it was too long to get prn medication when requested as needed here in the hospital and therefore requested it be scheduled and he can refuse).  If pain is better controlled on this regimen, will likely discharge home on fentanyl 300 mcg/h with a patch change every 48 hours and oxycodone 60 mg every 3 hours as needed for breakthrough pain.  If pain is not better controlled on this regimen, will likely discharge home on fentanyl 300 mcg/h with patch change every 48 hours and Dilaudid 16 mg p.o. every 3 hours as needed for breakthrough pain.  Anxiety/poor sleep: Uses alprazolam as needed at home.  Continue alprazolam as needed at night for sleep.  Goals of Care and Additional Recommendations:  Limitations on Scope of Treatment: Full Scope Treatment  Code Status:    Code Status Orders  (From admission, onward)         Start     Ordered   06/07/19 1855  Full code  Continuous     06/07/19 1857        Code Status History    Date Active Date Inactive Code Status Order ID Comments User Context   06/19/2016 2054 06/23/2016 1309 Full Code [id number]  Vianne Bulls, MD ED   Advance Care Planning Activity       Prognosis:   Unable to determine  Discharge Planning:  To Be Determined  Care plan was discussed with patient, significant other  Thank you for allowing the Palliative Medicine Team to assist in the care of this patient.   Time In: 1200 Time Out: 1240 Total Time 40 Prolonged Time Billed  No        Greater than 50%  of this time was spent counseling and coordinating care related to the above assessment and plan.  Micheline Rough, MD  Please contact Palliative Medicine Team phone at [phone] for questions and concerns.

## 2019-06-15 LAB — COMPREHENSIVE METABOLIC PANEL
ALT: 13 U/L (ref 0–44)
AST: 14 U/L — ABNORMAL LOW (ref 15–41)
Albumin: 1.4 g/dL — ABNORMAL LOW (ref 3.5–5.0)
Alkaline Phosphatase: 111 U/L (ref 38–126)
Anion gap: 6 (ref 5–15)
BUN: 5 mg/dL — ABNORMAL LOW (ref 6–20)
CO2: 26 mmol/L (ref 22–32)
Calcium: 7.9 mg/dL — ABNORMAL LOW (ref 8.9–10.3)
Chloride: 104 mmol/L (ref 98–111)
Creatinine, Ser: 0.8 mg/dL (ref 0.61–1.24)
GFR calc Af Amer: >60 mL/min (ref >60)
GFR calc non Af Amer: >60 mL/min (ref >60)
Glucose, Bld: 112 mg/dL — ABNORMAL HIGH (ref 70–99)
Potassium: 4.1 mmol/L (ref 3.5–5.1)
Sodium: 136 mmol/L (ref 135–145)
Total Bilirubin: 0.2 mg/dL — ABNORMAL LOW (ref 0.3–1.2)
Total Protein: 4.8 g/dL — ABNORMAL LOW (ref 6.5–8.1)

## 2019-06-15 LAB — CBC
HCT: 30.6 % — ABNORMAL LOW (ref 39.0–52.0)
Hemoglobin: 9.5 g/dL — ABNORMAL LOW (ref 13.0–17.0)
MCH: 29.6 pg (ref 26.0–34.0)
MCHC: 31 g/dL (ref 30.0–36.0)
MCV: 95.3 fL (ref 80.0–100.0)
Platelets: 309 10*3/uL (ref 150–400)
RBC: 3.21 MIL/uL — ABNORMAL LOW (ref 4.22–5.81)
RDW: 19.2 % — ABNORMAL HIGH (ref 11.5–15.5)
WBC: 12.8 10*3/uL — ABNORMAL HIGH (ref 4.0–10.5)
nRBC: 0 % (ref 0.0–0.2)

## 2019-06-15 LAB — PROTIME-INR
INR: 1.2 (ref 0.8–1.2)
Prothrombin Time: 15.4 seconds — ABNORMAL HIGH (ref 11.4–15.2)

## 2019-06-15 NOTE — Progress Notes (Signed)
Daily Progress Note   Patient Name: Jonathan Wright       Date: 06/15/2019 DOB: Nov 12, 1965  Age: 53 y.o. MRN#: [id number] Attending Physician: Hosie Poisson, MD Primary Care Physician: Deland Pretty, MD Admit Date: 06/07/2019  Reason for Consultation/Follow-up: Pain control  Subjective: I met today with Jonathan Wright.  His partner Tarri Fuller was also at the bedside.  He remained in good spirits and engaged in conversation today.  Reports pain "doing ok, I think".    Discussed transition back from dilaudid to oxycodone for rescue medication.  He feels the oxycodone dose of 60 mg is as or more effective than p.o. Dilaudid and would prefer to continue with it.  He did have some increased need for IV backup today, but he thinks that was due to extra movement from needed care rather than that the dilaudid was more effective.    I talked again with him about methadone as a possible alternative if he continues to have uncontrolled pain on fentanyl patches. For now, he would like to continue with plan for fentanyl 300 mcg/h as his long-acting medication.  Length of Stay: 8  Current Medications: Scheduled Meds:  . Chlorhexidine Gluconate Cloth  6 each Topical Daily  . feeding supplement (ENSURE ENLIVE)  237 mL Oral BID BM  . fentaNYL  1 patch Transdermal Q48H   And  . fentaNYL  1 patch Transdermal Q48H   And  . fentaNYL  1 patch Transdermal Q48H  . furosemide  20 mg Intravenous Daily  . multivitamin with minerals  1 tablet Oral Daily  . oxyCODONE  60 mg Oral Q3H  . pantoprazole  40 mg Oral BID  . vancomycin  500 mg Oral Q6H    Continuous Infusions: . sodium chloride Stopped (06/11/19 1424)    PRN Meds: ALPRAZolam, HYDROmorphone (DILAUDID) injection, prochlorperazine  Physical Exam          General: Alert and interactive. Chronically ill appearing Heart: Regular rate and rhythm. No murmur appreciated. Lungs: Good air movement, clear Abdomen: Soft, ileostomy, urostomy, and pelvic drain noted.  Ext: + edema Skin: Warm and dry Neuro: Grossly intact, nonfocal.   Vital Signs: BP 108/74 (BP Location: Left Arm)   Pulse 96   Temp 98.6 F (37 C) (Oral)   Resp 16   Ht 5' 10"  (1.778  m)   Wt 70.8 kg   SpO2 98%   BMI 22.38 kg/m  SpO2: SpO2: 98 % O2 Device: O2 Device: Room Air O2 Flow Rate: O2 Flow Rate (L/min): 3 L/min  Intake/output summary:   Intake/Output Summary (Last 24 hours) at 06/15/2019 2036 Last data filed at 06/15/2019 1700 Gross per 24 hour  Intake 1380 ml  Output 3820 ml  Net -2440 ml   LBM: Last BM Date: 06/15/19 Baseline Weight: Weight: 74 kg Most recent weight: Weight: 70.8 kg       Palliative Assessment/Data:      Patient Active Problem List   Diagnosis Date Noted  . Pseudoaneurysm ([place])   . Other pulmonary embolism without acute cor pulmonale ([place])   . Respiratory arrest ([place]) 06/07/2019  . Goals of care, counseling/discussion 10/11/2018  . Lymphadenopathy, inguinal   . Sepsis ([place]) 06/19/2016  . UTI (urinary tract infection) 06/19/2016  . Perirectal abscess 06/19/2016  . Severe anemia 06/19/2016  . Hyponatremia 06/19/2016  . Depression with anxiety 06/19/2016  . Rectal bleeding 06/19/2016  . Fever of unknown origin (FUO) 06/19/2016  . Sepsis, unspecified organism ([place]) 06/19/2016  . S/P PICC central line placement 01/13/2016  . Rectal cancer ([place]) 12/17/2015  . Internal hemorrhoids 12/10/2013    Palliative Care Assessment & Plan   Patient Profile: 53 y.o. male  with past medical history of depression, metastatic rectal cancer currently followed by Dr. 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  Palliative care consulted for pain management.    Assessment: Patient Active Problem List   Diagnosis Date Noted  . Pseudoaneurysm ([place])   . Other pulmonary embolism without acute cor pulmonale ([place])   . Respiratory arrest ([place]) 06/07/2019  . Goals of care, counseling/discussion 10/11/2018  . Lymphadenopathy, inguinal   . Sepsis ([place]) 06/19/2016  . UTI (urinary tract infection) 06/19/2016  . Perirectal abscess 06/19/2016  . Severe anemia 06/19/2016  . Hyponatremia 06/19/2016  . Depression with anxiety 06/19/2016  . Rectal bleeding 06/19/2016  . Fever of unknown origin (FUO) 06/19/2016  . Sepsis, unspecified organism ([place]) 06/19/2016  . S/P PICC central line placement 01/13/2016  . Rectal cancer ([place]) 12/17/2015  . Internal hemorrhoids 12/10/2013     Recommendations/Plan:  Pain: His pain remains better controlled today. If pain remains controlled tonight, will likely discharge home on fentanyl 300 mcg/h with a patch change every 48 hours and oxycodone 60 mg every 3 hours as needed for breakthrough pain.   Anxiety/poor sleep: Uses alprazolam as needed at home.  Continue alprazolam as needed at night for sleep.  Goals of Care and Additional Recommendations:  Limitations on Scope of Treatment: Full Scope Treatment  Code Status:    Code Status Orders  (From admission, onward)         Start     Ordered   06/07/19 1855  Full code  Continuous     06/07/19 1857        Code Status History    Date Active Date Inactive Code Status Order ID Comments User Context   06/19/2016 2054 06/23/2016 1309 Full Code [id number]  Vianne Bulls, MD ED   Advance Care Planning Activity       Prognosis:   Unable to determine  Discharge Planning:  To Be Determined  Care plan was discussed with patient, significant other  Thank you for allowing the Palliative Medicine Team to  assist in the care of this patient.   Total Time 20 Prolonged Time Billed  No       Greater than 50%  of this time was spent counseling and coordinating care related to the above assessment and plan.  Micheline Rough, MD  Please contact Palliative Medicine Team phone at [phone] for questions and concerns.

## 2019-06-15 NOTE — Progress Notes (Signed)
  CRITICAL VALUE ALERT  Critical Value:  Calcium 6.2   Date & Time Notied:  10/24 0800 Provider Notified: [facility] via Amion  Orders Received/Actions taken: Awaiting orders

## 2019-06-15 NOTE — Progress Notes (Signed)
PROGRESS NOTE    GENTLE BOGLE  [id number] DOB: November 03, 1965 DOA: 06/07/2019 PCP: Deland Pretty, MD    Brief Narrative:   53 year old gentleman with prior history of metastatic stage IIIb rectal cancer s/p diverting ileostomy with [name] follows up with oncology Dr. Delton Coombes at [facility] with most recent chemotherapy on April 23, 2019, was found to be hypoxic and hypotensive severely anemic ,  sepsis, with acute PE AKI.  He was transferred from [facility] to [facility] and admitted by [facility] for further management.  In September 2020 he was found to have foul-smelling discharge leaking from the sites of the draining tubes, 1 placed by IR at [facility], another one placed at [facility] in July 2020.  He was also found to have a urostomy and a colostomy placed in 2019 with multiple revisions and surgeries.  He underwent flap dissection on the right inner thigh in 2019 that continues to drain.  He was admitted by [facility] for sepsis and transferred to [facility] on 06/11/2019. He has completed 7 days of IV meropenem and continues to be on oral vancomycin to complete the course of C. difficile. His pain regimen is being managed by Dr. Domingo Cocking with palliative care.  Plan to discharge the patient with home care services once they are arranged.  Assessment & Plan:   Active Problems:   Sepsis ([place])   Severe anemia   Respiratory arrest ([place])   Pseudoaneurysm ([place])   Other pulmonary embolism without acute cor pulmonale (HCC)   Sepsis with hemorrhagic shock secondary to recurrent pelvic abscesses Resolved. Pelvic abscess cultures revealed E. coli, Klebsiella, Enterobacter sensitive to meropenem. No fever overnight and leukocytosis has improved to 15,000.  Completed 7 days of IV meropenem and plan for discharge home with home care services once they are arranged.  Acute left common femoral DVT and segmental pulmonary emboli S/p IR embolization on 06/08/2019.  S/p IVC filter/2018. Patient denies  any chest pain or shortness of breath  Acute blood loss anemia secondary to suspected pseudoaneurysm versus GI bleed Transfuse to keep hemoglobin greater than 7.  Continue with PPI twice daily. Hemoglobin is stable.  Patient's family would like H&H to be checked by home health nurse on discharge.   Recurrent metastatic rectal cancer s/p pelvic exenteration, longstanding chronic pelvic abscesses with fistulization, s/p pelvic radiation with JP drain in place Upsized IR drain. Recommend outpatient follow-up at [facility] Is able to tolerate diet without any nausea vomiting or abdominal pain.  He is on 300 MCG of fentanyl and oxycodone and Dilaudid. Appreciate pain management by palliative care.  AKI probably secondary to hemorrhagic shock Resolved  Currently waiting for PT eval and possibly home with home health PT once his pain is optimally controlled.  And home health services are arranged  Mild hyponatremia:  Resolved with IV Lasix.  C diff infection? Toxigenic C difficile PCR is positive- on oral vancomycin and  complete the course. Continue with oral vancomycin.  DVT prophylaxis: SCDs Code Status: Full code Family Communication: None at bedside Disposition Plan: DC home when pain is optimally controlled and home health services are arranged.   Consultants:   General surgery  IR  Procedures: (Upsizing of the pelvic drain by IR on 06/10/2019 ETT 10/17 extubated on 10/18 CT angio of the chest show segmental pulmonary embolism.   Antimicrobials:oral vancomycin for  c diff and Iv meropenam for klebsiella .  Subjective: No new complaints discussed about home health services and possible discharge tomorrow when  they are arranged. Objective: Vitals:   06/14/19 2100 06/15/19 0500 06/15/19 0842 06/15/19 1208  BP: 114/84  104/76   Pulse: 100  (!) 106   Resp: 15     Temp:   99 F (37.2 C) 98.6 F (37 C)  TempSrc:   Oral Oral  SpO2: 97%  98%   Weight:  71.4 kg     Height:        Intake/Output Summary (Last 24 hours) at 06/15/2019 1512 Last data filed at 06/15/2019 1500 Gross per 24 hour  Intake 760 ml  Output 3925 ml  Net -3165 ml   Filed Weights   06/09/19 0500 06/13/19 0500 06/15/19 0500  Weight: 79.9 kg 80.8 kg 71.4 kg    Examination:  General exam: alert and comfortable.  Respiratory system: air entry fair , no wheezing or rhonchi.  Cardiovascular system: s1s2. RRR.  Gastrointestinal system: abd is soft, non tender , non distended bowel sounds  are good. Ileostomy and urostomy in place with pelvic drain. Central nervous system: alert and oriented.  Extremities: Pedal edema is improving Skin: No rashes, JP drain in place, ileostomy in place. Psychiatry: Mood is appropriate    Data Reviewed: I have personally reviewed following labs and imaging studies  CBC: Recent Labs  Lab 06/09/19 1023 06/10/19 0419 06/12/19 0425 06/13/19 0505 06/14/19 1836 06/15/19 0324  WBC 33.6* 32.9* 20.0* 15.2*  --  12.8*  NEUTROABS  --   --   --  12.0*  --   --   HGB 8.8* 8.9* 8.6* 9.2* 8.2* 9.5*  HCT 25.9* 26.9* 27.1* 29.4* 26.2* 30.6*  MCV 90.2 91.8 93.8 94.2  --  95.3  PLT 193 234 296 314  --  [id number]   Basic Metabolic Panel: Recent Labs  Lab 06/09/19 0406 06/10/19 0419 06/12/19 0425 06/13/19 0505 06/14/19 1844 06/15/19 0324  NA 141 135 133* 136 139 136  K 3.2* 3.8 3.6 3.9 3.1* 4.1  CL 113* 108 106 104 103 104  CO2 20* 20* 21* 23 26 26   GLUCOSE 107* 111* 132* 92 133* 112*  BUN 20 17 8 7 7  5*  CREATININE 1.21 1.04 0.91 0.77 0.55* 0.80  CALCIUM 7.0* 7.3* 7.0* 7.4* 6.2* 7.9*  MG 2.0 2.0  --   --   --   --   PHOS 3.3 2.1*  --   --   --   --    GFR: Estimated Creatinine Clearance: 109.1 mL/min (by C-G formula based on SCr of 0.8 mg/dL). Liver Function Tests: Recent Labs  Lab 06/08/19 1539 06/09/19 0406 06/13/19 0505 06/15/19 0324  AST 22 18 21  14*  ALT 15 14 13 13   ALKPHOS 106 105 107 111  BILITOT 1.0 0.7 0.4 0.2*  PROT 3.7*  3.8* 4.3* 4.8*  ALBUMIN 1.1* 1.1* 1.2* 1.4*   No results for input(s): LIPASE, AMYLASE in the last 168 hours. No results for input(s): AMMONIA in the last 168 hours. Coagulation Profile: Recent Labs  Lab 06/11/19 0500 06/12/19 0425 06/13/19 0505 06/14/19 0418 06/15/19 0324  INR 1.4* 1.2 1.3* 1.3* 1.2   Cardiac Enzymes: Recent Labs  Lab 06/11/19 0500 06/11/19 1700 06/12/19 0425 06/12/19 1706 06/13/19 0505  CKTOTAL 26* 89 108 44* 41*   BNP (last 3 results) No results for input(s): PROBNP in the last 8760 hours. HbA1C: No results for input(s): HGBA1C in the last 72 hours. CBG: Recent Labs  Lab 06/12/19 1536 06/12/19 1946 06/12/19 2335 06/13/19 0334 06/14/19 1938  GLUCAP 97 114*  91 94 114*   Lipid Profile: No results for input(s): CHOL, HDL, LDLCALC, TRIG, CHOLHDL, LDLDIRECT in the last 72 hours. Thyroid Function Tests: No results for input(s): TSH, T4TOTAL, FREET4, T3FREE, THYROIDAB in the last 72 hours. Anemia Panel: No results for input(s): VITAMINB12, FOLATE, FERRITIN, TIBC, IRON, RETICCTPCT in the last 72 hours. Sepsis Labs: No results for input(s): PROCALCITON, LATICACIDVEN in the last 168 hours.  Recent Results (from the past 240 hour(s))  Blood culture (routine x 2)     Status: None   Collection Time: 06/07/19  3:24 PM   Specimen: BLOOD LEFT HAND  Result Value Ref Range Status   Specimen Description   Final    BLOOD LEFT HAND BOTTLES DRAWN AEROBIC AND ANAEROBIC   Special Requests Blood Culture adequate volume  Final   Culture   Final    NO GROWTH 5 DAYS Performed at [facility], [address] [place], [place] [address]    Report Status 06/12/2019 FINAL  Final  Blood culture (routine x 2)     Status: None   Collection Time: 06/07/19  3:31 PM   Specimen: BLOOD RIGHT HAND  Result Value Ref Range Status   Specimen Description BLOOD RIGHT HAND  Final   Special Requests   Final    BOTTLES DRAWN AEROBIC ONLY Blood Culture adequate volume   Culture    Final    NO GROWTH 5 DAYS Performed at [facility], [address], [place], [place] [address]    Report Status 06/12/2019 FINAL  Final  SARS Coronavirus 2 by RT PCR (hospital order, performed in [place] hospital lab) Nasopharyngeal Nasopharyngeal Swab     Status: None   Collection Time: 06/07/19  3:50 PM   Specimen: Nasopharyngeal Swab  Result Value Ref Range Status   SARS Coronavirus 2 NEGATIVE NEGATIVE Final    Comment: (NOTE) If result is NEGATIVE SARS-CoV-2 target nucleic acids are NOT DETECTED. The SARS-CoV-2 RNA is generally detectable in upper and lower  respiratory specimens during the acute phase of infection. The lowest  concentration of SARS-CoV-2 viral copies this assay can detect is 250  copies / mL. A negative result does not preclude SARS-CoV-2 infection  and should not be used as the sole basis for treatment or other  patient management decisions.  A negative result may occur with  improper specimen collection / handling, submission of specimen other  than nasopharyngeal swab, presence of viral mutation(s) within the  areas targeted by this assay, and inadequate number of viral copies  (<250 copies / mL). A negative result must be combined with clinical  observations, patient history, and epidemiological information. If result is POSITIVE SARS-CoV-2 target nucleic acids are DETECTED. The SARS-CoV-2 RNA is generally detectable in upper and lower  respiratory specimens dur ing the acute phase of infection.  Positive  results are indicative of active infection with SARS-CoV-2.  Clinical  correlation with patient history and other diagnostic information is  necessary to determine patient infection status.  Positive results do  not rule out bacterial infection or co-infection with other viruses. If result is PRESUMPTIVE POSTIVE SARS-CoV-2 nucleic acids MAY BE PRESENT.   A presumptive positive result was obtained on the submitted specimen  and confirmed on repeat  testing.  While 2019 novel coronavirus  (SARS-CoV-2) nucleic acids may be present in the submitted sample  additional confirmatory testing may be necessary for epidemiological  and / or clinical management purposes  to differentiate between  SARS-CoV-2 and other Sarbecovirus currently known  to infect humans.  If clinically indicated additional testing with an alternate test  methodology [phone]) is advised. The SARS-CoV-2 RNA is generally  detectable in upper and lower respiratory sp ecimens during the acute  phase of infection. The expected result is Negative. Fact Sheet for Patients:  StrictlyIdeas.no Fact Sheet for Healthcare Providers: BankingDealers.co.za This test is not yet approved or cleared by the [place] FDA and has been authorized for detection and/or diagnosis of SARS-CoV-2 by FDA under an Emergency Use Authorization (EUA).  This EUA will remain in effect (meaning this test can be used) for the duration of the COVID-19 declaration under Section 564(b)(1) of the Act, 21 U.S.C. section 360bbb-3(b)(1), unless the authorization is terminated or revoked sooner. Performed at [facility], [address], [place], [place] [address]   MRSA PCR Screening     Status: None   Collection Time: 06/07/19  6:01 PM   Specimen: Nasopharyngeal  Result Value Ref Range Status   MRSA by PCR NEGATIVE NEGATIVE Final    Comment:        The GeneXpert MRSA Assay (FDA approved for NASAL specimens only), is one component of a comprehensive MRSA colonization surveillance program. It is not intended to diagnose MRSA infection nor to guide or monitor treatment for MRSA infections. Performed at [facility], [place] [address] [place], [place] [address]   C difficile quick scan w PCR reflex     Status: Abnormal   Collection Time: 06/07/19  8:24 PM   Specimen: STOOL  Result Value Ref Range Status   C Diff antigen POSITIVE (A) NEGATIVE  Final   C Diff toxin NEGATIVE NEGATIVE Final   C Diff interpretation Results are indeterminate. See PCR results.  Final    Comment: Performed at [facility], [place] [address] [place], [place] [address]  C. Diff by PCR, Reflexed     Status: Abnormal   Collection Time: 06/07/19  8:24 PM  Result Value Ref Range Status   Toxigenic C. Difficile by PCR POSITIVE (A) NEGATIVE Final    Comment: Positive for toxigenic C. difficile with little to no toxin production. Only treat if clinical presentation suggests symptomatic illness. Performed at [facility], [place] [address], [place], [place] [address]   Aerobic/Anaerobic Culture (surgical/deep wound)     Status: None   Collection Time: 06/07/19  8:35 PM   Specimen: Fluid; Other  Result Value Ref Range Status   Specimen Description FLUID  Final   Special Requests JP DRAIN FLUID FROM RIGHT BUTTOCK  Final   Gram Stain   Final    RARE WBC PRESENT, PREDOMINANTLY PMN ABUNDANT GRAM NEGATIVE RODS FEW GRAM POSITIVE COCCI FEW GRAM POSITIVE RODS Performed at [facility], [place] [address], [place], [place] [address]    Culture   Final    ABUNDANT KLEBSIELLA PNEUMONIAE MODERATE ENTEROCOCCUS FAECIUM MODERATE ESCHERICHIA COLI MIXED ANAEROBIC FLORA PRESENT.  CALL LAB IF FURTHER IID REQUIRED.    Report Status 06/11/2019 FINAL  Final   Organism ID, Bacteria KLEBSIELLA PNEUMONIAE  Final   Organism ID, Bacteria ENTEROCOCCUS FAECIUM  Final   Organism ID, Bacteria ESCHERICHIA COLI  Final      Susceptibility   Escherichia coli - MIC*    AMPICILLIN >=32 RESISTANT Resistant     CEFAZOLIN <=4 SENSITIVE Sensitive     CEFEPIME <=1 SENSITIVE Sensitive     CEFTAZIDIME 4 SENSITIVE Sensitive     CEFTRIAXONE <=1 SENSITIVE Sensitive     CIPROFLOXACIN >=  4 RESISTANT Resistant     GENTAMICIN <=1 SENSITIVE Sensitive     IMIPENEM <=0.25 SENSITIVE Sensitive     TRIMETH/SULFA >=320 RESISTANT Resistant     AMPICILLIN/SULBACTAM >=32 RESISTANT Resistant      Extended ESBL NEGATIVE Sensitive     * MODERATE ESCHERICHIA COLI   Enterococcus faecium - MIC*    AMPICILLIN <=2 SENSITIVE Sensitive     VANCOMYCIN <=0.5 SENSITIVE Sensitive     GENTAMICIN SYNERGY SENSITIVE Sensitive     * MODERATE ENTEROCOCCUS FAECIUM   Klebsiella pneumoniae - MIC*    AMPICILLIN >=32 RESISTANT Resistant     CEFAZOLIN <=4 SENSITIVE Sensitive     CEFEPIME <=1 SENSITIVE Sensitive     CEFTAZIDIME <=1 SENSITIVE Sensitive     CEFTRIAXONE <=1 SENSITIVE Sensitive     CIPROFLOXACIN 1 SENSITIVE Sensitive     GENTAMICIN <=1 SENSITIVE Sensitive     IMIPENEM <=0.25 SENSITIVE Sensitive     TRIMETH/SULFA >=320 RESISTANT Resistant     AMPICILLIN/SULBACTAM 4 SENSITIVE Sensitive     PIP/TAZO 8 SENSITIVE Sensitive     Extended ESBL NEGATIVE Sensitive     * ABUNDANT KLEBSIELLA PNEUMONIAE         Radiology Studies: No results found.      Scheduled Meds: . Chlorhexidine Gluconate Cloth  6 each Topical Daily  . feeding supplement (ENSURE ENLIVE)  237 mL Oral BID BM  . fentaNYL  1 patch Transdermal Q48H   And  . fentaNYL  1 patch Transdermal Q48H   And  . fentaNYL  1 patch Transdermal Q48H  . furosemide  20 mg Intravenous Daily  . multivitamin with minerals  1 tablet Oral Daily  . oxyCODONE  60 mg Oral Q3H  . pantoprazole  40 mg Oral BID  . vancomycin  500 mg Oral Q6H   Continuous Infusions: . sodium chloride Stopped (06/11/19 1424)     LOS: 8 days        Hosie Poisson, MD [facility] Pager [phone]  If 7PM-7AM, please contact night-coverage [email or web address] Password [email or web address] 06/15/2019, 3:12 PM

## 2019-06-15 NOTE — TOC Progression Note (Signed)
Transition of Care [facility]) - Progression Note    Patient Details  Name: Jonathan Wright MRN: [id number] Date of Birth: [birth year]  [facility] CM/SW Contact  Bartholomew Crews, RN Phone Number: [phone] 06/15/2019, 4:53 PM  Clinical Narrative:    Received call from MD about [facility] needs. Discussed difficulty with finding an accepting home health agency. Spoke with patient and significant other, Jonathan Wright, at bedside to discuss home health needs. Patient and Jonathan Wright wanting PT and a RN to do labs for the oncologist. Discussed that the only agency to consider case expects that patient will have a copay of 30% which may be around $70/visit. Patient is agreeable to this but states that his insurance company has told him that his out of pocket expense is maxed at $5000. Patient verbalized acceptance that he may have a copay for each visit and is in agreement. Patient and Jonathan Wright are appreciative of any assistance.   Spoke with Malachy Mood at [place] [facility] to discuss case again. Advised that patient is accepting of copay. [facility] referral for RN and PT pending. Anticipate transition home tomorrow, and will need PTAR transport d/t unable to sit up.   TOC following for transitions.   Expected Discharge Plan: [place]    Expected Discharge Plan and Services Expected Discharge Plan: [place]   Discharge Planning Services: CM Consult Post Acute Care Choice: [place] arrangements for the past 2 months: Single Family Home                 DME Arranged: [name] DME Agency: NA       HH Arranged: RN, PT           Social Determinants of Health (SDOH) Interventions    Readmission Risk Interventions No flowsheet data found.

## 2019-06-15 NOTE — Plan of Care (Signed)
  Problem: Nutrition: Goal: Adequate nutrition will be maintained Outcome: Progressing   Problem: Elimination: Goal: Will not experience complications related to bowel motility Outcome: Progressing Goal: Will not experience complications related to urinary retention Outcome: Progressing   Problem: Pain Managment: Goal: General experience of comfort will improve Outcome: Progressing Note: Current pain regimen is appropriate for pain control   Problem: Safety: Goal: Ability to remain free from injury will improve Outcome: Progressing   Problem: Activity: Goal: Risk for activity intolerance will decrease Outcome: Not Progressing Note: Pt is able to move around himself in bed, but he is still unable to get out of bed.

## 2019-06-15 NOTE — TOC Progression Note (Signed)
Transition of Care [facility]) - Progression Note    Patient Details  Name: Jonathan Wright MRN: [id number] Date of Birth: Dec 26, 1965  Transition of Care [facility]) CM/SW Contact  Bartholomew Crews, RN Phone Number: [phone] 06/15/2019, 9:39 AM  Clinical Narrative:    Received message back from [facility] late Friday evening that they are unable to accept patient for [facility] services.    Expected Discharge Plan: [place]    Expected Discharge Plan and Services Expected Discharge Plan: [place]   Discharge Planning Services: CM Consult Post Acute Care Choice: [place] arrangements for the past 2 months: Single Family Home                 DME Arranged: [name] DME Agency: NA       HH Arranged: RN, PT           Social Determinants of Health (SDOH) Interventions    Readmission Risk Interventions No flowsheet data found.

## 2019-06-16 ENCOUNTER — Inpatient Hospital Stay (HOSPITAL_COMMUNITY): Payer: BC Managed Care – PPO

## 2019-06-16 DIAGNOSIS — Z515 Encounter for palliative care: Secondary | ICD-10-CM

## 2019-06-16 DIAGNOSIS — G893 Neoplasm related pain (acute) (chronic): Secondary | ICD-10-CM

## 2019-06-16 LAB — CBC
HCT: 32.3 % — ABNORMAL LOW (ref 39.0–52.0)
Hemoglobin: 10.2 g/dL — ABNORMAL LOW (ref 13.0–17.0)
MCH: 30.2 pg (ref 26.0–34.0)
MCHC: 31.6 g/dL (ref 30.0–36.0)
MCV: 95.6 fL (ref 80.0–100.0)
Platelets: 413 10*3/uL — ABNORMAL HIGH (ref 150–400)
RBC: 3.38 MIL/uL — ABNORMAL LOW (ref 4.22–5.81)
RDW: 19.4 % — ABNORMAL HIGH (ref 11.5–15.5)
WBC: 11.5 10*3/uL — ABNORMAL HIGH (ref 4.0–10.5)
nRBC: 0 % (ref 0.0–0.2)

## 2019-06-16 LAB — BASIC METABOLIC PANEL
Anion gap: 9 (ref 5–15)
BUN: 8 mg/dL (ref 6–20)
CO2: 25 mmol/L (ref 22–32)
Calcium: 7.8 mg/dL — ABNORMAL LOW (ref 8.9–10.3)
Chloride: 100 mmol/L (ref 98–111)
Creatinine, Ser: 0.93 mg/dL (ref 0.61–1.24)
GFR calc Af Amer: >60 mL/min (ref >60)
GFR calc non Af Amer: >60 mL/min (ref >60)
Glucose, Bld: 120 mg/dL — ABNORMAL HIGH (ref 70–99)
Potassium: 3.6 mmol/L (ref 3.5–5.1)
Sodium: 134 mmol/L — ABNORMAL LOW (ref 135–145)

## 2019-06-16 LAB — PROTIME-INR
INR: 1.1 (ref 0.8–1.2)
Prothrombin Time: 14.1 seconds (ref 11.4–15.2)

## 2019-06-16 MED ORDER — ENSURE ENLIVE PO LIQD: 237.0000 mL | Freq: Two times a day (BID) | ORAL | 12 refills | Status: AC

## 2019-06-16 MED ORDER — ADULT MULTIVITAMIN W/MINERALS CH: 1.0000 | ORAL_TABLET | Freq: Every day | ORAL | Status: AC

## 2019-06-16 MED ORDER — ZINC OXIDE 40 % EX OINT: TOPICAL_OINTMENT | CUTANEOUS | 0 refills | Status: AC

## 2019-06-16 MED ORDER — ZINC OXIDE 40 % EX OINT
TOPICAL_OINTMENT | CUTANEOUS | Status: DC
  Administered 2019-06-16: 22:00:00 via TOPICAL
  Administered 2019-06-16: 1 via TOPICAL
  Administered 2019-06-17 (×2): via TOPICAL
  Filled 2019-06-16 (×2): qty 57

## 2019-06-16 MED ORDER — GERHARDT'S BUTT CREAM
TOPICAL_CREAM | CUTANEOUS | Status: DC
  Administered 2019-06-16: 1 via TOPICAL
  Administered 2019-06-16 – 2019-06-17 (×5): via TOPICAL
  Filled 2019-06-16 (×2): qty 1

## 2019-06-16 MED ORDER — FENTANYL 100 MCG/HR TD PT72: 3.0000 | MEDICATED_PATCH | TRANSDERMAL | 0 refills | Status: DC

## 2019-06-16 MED ORDER — OXYCODONE HCL 30 MG PO TABS: 60.0000 mg | ORAL_TABLET | ORAL | 0 refills | Status: DC | PRN

## 2019-06-16 MED ORDER — GERHARDT'S BUTT CREAM: 1.0000 "application " | TOPICAL_CREAM | CUTANEOUS | Status: AC

## 2019-06-16 NOTE — Progress Notes (Signed)
For possible DC tomorrow.  Reviewed skin care regimen (Desitin Max strength alternating with Gerhardts butt cream) with [name].  He is very involved in his care and has been doing a great job with the drain and fistula drainage management.  Virtually nothing is coming out of the ileostomy and everything is coming out of the fistula.  Placed a rectangular foam dressing under the right JP drain tube to protect the skin underneath.  Urostomy is working well with 2400 total ml output today.

## 2019-06-16 NOTE — Progress Notes (Signed)
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 Delton Coombes at [facility] [place] with most recent chemotherapy on April 23, 2019, was found to be hypoxic and hypotensive severely anemic ,  sepsis, with acute PE AKI.  He was transferred from [facility] to [facility] and admitted by [facility] for further management.  In September 2020 he was found to have foul-smelling discharge leaking from the sites of the draining tubes, 1 placed by IR at [facility], another one placed at [facility] in July 2020.  He was also found to have a urostomy and a colostomy placed in 2019 with multiple revisions and surgeries.  He underwent flap dissection on the right inner thigh in 2019 that continues to drain.  He was admitted by [facility] for sepsis and transferred to [facility] on 06/11/2019. He has completed 7 days of IV meropenem and continues to be on oral vancomycin to complete the course of C. difficile. His pain regimen is being managed by Dr. Domingo Cocking with palliative care.  Plan to discharge the patient with home care services once they are arranged. Earlier this am, there was increased leakage around one of the drains and felt the drain is disloged by wound care. IR consulted and sacral imaging ordered which showed Pigtail drainage catheter is noted in the presacral space. Focal lucency is seen involving midportion of the sacrum which corresponds to abnormality seen on prior CT scan and may represent either osteomyelitis or metastatic disease.  The skin sutures have def come loose but IR reviwed the images and felt the catheter remains in place and continues to drain, hence no plans to reposition the drain at this time.   Assessment & Plan:   Active Problems:   Sepsis ([place])   Severe anemia   Respiratory  arrest ([place])   Pseudoaneurysm ([place])   Other pulmonary embolism without acute cor pulmonale (HCC)   Sepsis with hemorrhagic shock secondary to recurrent pelvic abscesses Resolved. Pelvic abscess cultures revealed E. coli, Klebsiella, Enterobacter sensitive to meropenem. No fever overnight and leukocytosis has improved to 15,000.  Completed 7 days of IV meropenem and plan for discharge home with home care services once they are arranged. Earlier this am, there was increased leakage around one of the drains and felt the drain is disloged by wound care. IR consulted and sacral imaging ordered which showed Pigtail drainage catheter is noted in the presacral space. Focal lucency is seen involving midportion of the sacrum which corresponds to abnormality seen on prior CT scan and may represent either osteomyelitis or metastatic disease.  The skin sutures have def come loose but IR reviwed the images and felt the catheter remains in place and continues to drain, hence no plans to reposition the drain at this time.     Acute left common femoral DVT and segmental pulmonary emboli S/p IR embolization on 06/08/2019.  S/p IVC filter/2018. Patient denies any chest pain or shortness of breath  Acute blood loss anemia secondary to suspected pseudoaneurysm versus GI bleed Transfuse to keep hemoglobin greater than 7.  Continue with PPI twice daily. Hemoglobin remains stable.   Recurrent metastatic rectal cancer s/p pelvic exenteration, longstanding chronic pelvic abscesses with fistulization, s/p pelvic radiation with JP drain in place Upsized IR drain. Recommend outpatient follow-up at [facility]  Is able to tolerate diet without any nausea vomiting or abdominal pain.  He is on 300 MCG of fentanyl and oxycodone and Dilaudid. Appreciate pain management by palliative care.  AKI probably secondary to hemorrhagic shock Resolved  Currently waiting for home health services to be arranged for discharge  in the morning.  Mild hyponatremia:  Resolved with IV Lasix.  Discontinued IV Lasix.  C diff infection? Toxigenic C difficile PCR is positive- on oral vancomycin and  complete the course. Continue with oral vancomycin.  To complete 14-day course.  DVT prophylaxis: SCDs Code Status: Full code Family Communication: None at bedside Disposition Plan: Possible discharge home tomorrow  Consultants:   General surgery  IR  Palliative care consult  Procedures: (Upsizing of the pelvic drain by IR on 06/10/2019 ETT 10/17 extubated on 10/18 CT angio of the chest show segmental pulmonary embolism.   Antimicrobials:oral vancomycin for  c diff and Iv meropenam for klebsiella .  Subjective: Patient appears to be in good spirits, no chest pain or shortness of breath no nausea vomiting. Objective: Vitals:   06/15/19 2131 06/16/19 0006 06/16/19 0357 06/16/19 1637  BP: 97/72 110/85 110/76 109/69  Pulse: 96 (!) 103 (!) 102 (!) 106  Resp: 12 18 17 18   Temp:  98.4 F (36.9 C) 98.8 F (37.1 C) 98.3 F (36.8 C)  TempSrc:    Oral  SpO2: 100% 99% 100% 100%  Weight:      Height:        Intake/Output Summary (Last 24 hours) at 06/16/2019 1759 Last data filed at 06/16/2019 1638 Gross per 24 hour  Intake 757 ml  Output 600 ml  Net 157 ml   Filed Weights   06/13/19 0500 06/15/19 0500 06/15/19 1804  Weight: 80.8 kg 71.4 kg 70.8 kg    Examination:  General exam: Alert and comfortable. Respiratory system: Bilateral air entry fair, no wheezing or rhonchi Cardiovascular system: S1-S2 heard, regular rate rhythm, no JVD Gastrointestinal system: Abdomen is soft, nontender, nondistended, bowel sounds are good pelvic drain in place, urostomy and ileostomy in place.  Central nervous system: alert and oriented.  Nonfocal Extremities: Pedal edema is improving Skin: JP drain in place, ileostomy in place. Psychiatry: Mood is appropriate    Data Reviewed: I have personally reviewed following  labs and imaging studies  CBC: Recent Labs  Lab 06/10/19 0419 06/12/19 0425 06/13/19 0505 06/14/19 1836 06/15/19 0324 06/16/19 1630  WBC 32.9* 20.0* 15.2*  --  12.8* 11.5*  NEUTROABS  --   --  12.0*  --   --   --   HGB 8.9* 8.6* 9.2* 8.2* 9.5* 10.2*  HCT 26.9* 27.1* 29.4* 26.2* 30.6* 32.3*  MCV 91.8 93.8 94.2  --  95.3 95.6  PLT 234 296 314  --  309 123XX123*   Basic Metabolic Panel: Recent Labs  Lab 06/10/19 0419 06/12/19 0425 06/13/19 0505 06/14/19 1844 06/15/19 0324 06/16/19 1630  NA 135 133* 136 139 136 134*  K 3.8 3.6 3.9 3.1* 4.1 3.6  CL 108 106 104 103 104 100  CO2 20* 21* 23 26 26 25   GLUCOSE 111* 132* 92 133* 112* 120*  BUN 17 8 7 7  5* 8  CREATININE 1.04 0.91 0.77 0.55* 0.80 0.93  CALCIUM 7.3* 7.0* 7.4* 6.2* 7.9* 7.8*  MG 2.0  --   --   --   --   --   PHOS 2.1*  --   --   --   --   --  GFR: Estimated Creatinine Clearance: 93 mL/min (by C-G formula based on SCr of 0.93 mg/dL). Liver Function Tests: Recent Labs  Lab 06/13/19 0505 06/15/19 0324  AST 21 14*  ALT 13 13  ALKPHOS 107 111  BILITOT 0.4 0.2*  PROT 4.3* 4.8*  ALBUMIN 1.2* 1.4*   No results for input(s): LIPASE, AMYLASE in the last 168 hours. No results for input(s): AMMONIA in the last 168 hours. Coagulation Profile: Recent Labs  Lab 06/12/19 0425 06/13/19 0505 06/14/19 0418 06/15/19 0324 06/16/19 0521  INR 1.2 1.3* 1.3* 1.2 1.1   Cardiac Enzymes: Recent Labs  Lab 06/11/19 0500 06/11/19 1700 06/12/19 0425 06/12/19 1706 06/13/19 0505  CKTOTAL 26* 89 108 44* 41*   BNP (last 3 results) No results for input(s): PROBNP in the last 8760 hours. HbA1C: No results for input(s): HGBA1C in the last 72 hours. CBG: Recent Labs  Lab 06/12/19 1536 06/12/19 1946 06/12/19 2335 06/13/19 0334 06/14/19 1938  GLUCAP 97 114* 91 94 114*   Lipid Profile: No results for input(s): CHOL, HDL, LDLCALC, TRIG, CHOLHDL, LDLDIRECT in the last 72 hours. Thyroid Function Tests: No results for  input(s): TSH, T4TOTAL, FREET4, T3FREE, THYROIDAB in the last 72 hours. Anemia Panel: No results for input(s): VITAMINB12, FOLATE, FERRITIN, TIBC, IRON, RETICCTPCT in the last 72 hours. Sepsis Labs: No results for input(s): PROCALCITON, LATICACIDVEN in the last 168 hours.  Recent Results (from the past 240 hour(s))  Blood culture (routine x 2)     Status: None   Collection Time: 06/07/19  3:24 PM   Specimen: BLOOD LEFT HAND  Result Value Ref Range Status   Specimen Description   Final    BLOOD LEFT HAND BOTTLES DRAWN AEROBIC AND ANAEROBIC   Special Requests Blood Culture adequate volume  Final   Culture   Final    NO GROWTH 5 DAYS Performed at [facility], [address], [place], [place] [address]    Report Status 06/12/2019 FINAL  Final  Blood culture (routine x 2)     Status: None   Collection Time: 06/07/19  3:31 PM   Specimen: BLOOD RIGHT HAND  Result Value Ref Range Status   Specimen Description BLOOD RIGHT HAND  Final   Special Requests   Final    BOTTLES DRAWN AEROBIC ONLY Blood Culture adequate volume   Culture   Final    NO GROWTH 5 DAYS Performed at [facility], [address] [place], [place] [address]    Report Status 06/12/2019 FINAL  Final  SARS Coronavirus 2 by RT PCR (hospital order, performed in [place] hospital lab) Nasopharyngeal Nasopharyngeal Swab     Status: None   Collection Time: 06/07/19  3:50 PM   Specimen: Nasopharyngeal Swab  Result Value Ref Range Status   SARS Coronavirus 2 NEGATIVE NEGATIVE Final    Comment: (NOTE) If result is NEGATIVE SARS-CoV-2 target nucleic acids are NOT DETECTED. The SARS-CoV-2 RNA is generally detectable in upper and lower  respiratory specimens during the acute phase of infection. The lowest  concentration of SARS-CoV-2 viral copies this assay can detect is 250  copies / mL. A negative result does not preclude SARS-CoV-2 infection  and should not be used as the sole basis for treatment or other  patient  management decisions.  A negative result may occur with  improper specimen collection / handling, submission of specimen other  than nasopharyngeal swab, presence of viral mutation(s) within the  areas targeted by this assay, and inadequate number of viral copies  (<  250 copies / mL). A negative result must be combined with clinical  observations, patient history, and epidemiological information. If result is POSITIVE SARS-CoV-2 target nucleic acids are DETECTED. The SARS-CoV-2 RNA is generally detectable in upper and lower  respiratory specimens dur ing the acute phase of infection.  Positive  results are indicative of active infection with SARS-CoV-2.  Clinical  correlation with patient history and other diagnostic information is  necessary to determine patient infection status.  Positive results do  not rule out bacterial infection or co-infection with other viruses. If result is PRESUMPTIVE POSTIVE SARS-CoV-2 nucleic acids MAY BE PRESENT.   A presumptive positive result was obtained on the submitted specimen  and confirmed on repeat testing.  While 2019 novel coronavirus  (SARS-CoV-2) nucleic acids may be present in the submitted sample  additional confirmatory testing may be necessary for epidemiological  and / or clinical management purposes  to differentiate between  SARS-CoV-2 and other Sarbecovirus currently known to infect humans.  If clinically indicated additional testing with an alternate test  methodology [phone]) is advised. The SARS-CoV-2 RNA is generally  detectable in upper and lower respiratory sp ecimens during the acute  phase of infection. The expected result is Negative. Fact Sheet for Patients:  StrictlyIdeas.no Fact Sheet for Healthcare Providers: BankingDealers.co.za This test is not yet approved or cleared by the [place] FDA and has been authorized for detection and/or diagnosis of SARS-CoV-2 by FDA under  an Emergency Use Authorization (EUA).  This EUA will remain in effect (meaning this test can be used) for the duration of the COVID-19 declaration under Section 564(b)(1) of the Act, 21 U.S.C. section 360bbb-3(b)(1), unless the authorization is terminated or revoked sooner. Performed at [facility], [address], [place], [place] [address]   MRSA PCR Screening     Status: None   Collection Time: 06/07/19  6:01 PM   Specimen: Nasopharyngeal  Result Value Ref Range Status   MRSA by PCR NEGATIVE NEGATIVE Final    Comment:        The GeneXpert MRSA Assay (FDA approved for NASAL specimens only), is one component of a comprehensive MRSA colonization surveillance program. It is not intended to diagnose MRSA infection nor to guide or monitor treatment for MRSA infections. Performed at [facility], [place] [address] [place], [place] [address]   C difficile quick scan w PCR reflex     Status: Abnormal   Collection Time: 06/07/19  8:24 PM   Specimen: STOOL  Result Value Ref Range Status   C Diff antigen POSITIVE (A) NEGATIVE Final   C Diff toxin NEGATIVE NEGATIVE Final   C Diff interpretation Results are indeterminate. See PCR results.  Final    Comment: Performed at [facility], [place] [address], [place], [place] [address]  C. Diff by PCR, Reflexed     Status: Abnormal   Collection Time: 06/07/19  8:24 PM  Result Value Ref Range Status   Toxigenic C. Difficile by PCR POSITIVE (A) NEGATIVE Final    Comment: Positive for toxigenic C. difficile with little to no toxin production. Only treat if clinical presentation suggests symptomatic illness. Performed at [facility], [place] [address] [place], [place] [address]   Aerobic/Anaerobic Culture (surgical/deep wound)     Status: None   Collection Time: 06/07/19  8:35 PM   Specimen: Fluid; Other  Result Value Ref Range Status   Specimen Description FLUID  Final   Special Requests JP DRAIN FLUID FROM  RIGHT BUTTOCK  Final    Gram Stain   Final    RARE WBC PRESENT, PREDOMINANTLY PMN ABUNDANT GRAM NEGATIVE RODS FEW GRAM POSITIVE COCCI FEW GRAM POSITIVE RODS Performed at [facility], [place] [address], [place], [place] [address]    Culture   Final    ABUNDANT KLEBSIELLA PNEUMONIAE MODERATE ENTEROCOCCUS FAECIUM MODERATE ESCHERICHIA COLI MIXED ANAEROBIC FLORA PRESENT.  CALL LAB IF FURTHER IID REQUIRED.    Report Status 06/11/2019 FINAL  Final   Organism ID, Bacteria KLEBSIELLA PNEUMONIAE  Final   Organism ID, Bacteria ENTEROCOCCUS FAECIUM  Final   Organism ID, Bacteria ESCHERICHIA COLI  Final      Susceptibility   Escherichia coli - MIC*    AMPICILLIN >=32 RESISTANT Resistant     CEFAZOLIN <=4 SENSITIVE Sensitive     CEFEPIME <=1 SENSITIVE Sensitive     CEFTAZIDIME 4 SENSITIVE Sensitive     CEFTRIAXONE <=1 SENSITIVE Sensitive     CIPROFLOXACIN >=4 RESISTANT Resistant     GENTAMICIN <=1 SENSITIVE Sensitive     IMIPENEM <=0.25 SENSITIVE Sensitive     TRIMETH/SULFA >=320 RESISTANT Resistant     AMPICILLIN/SULBACTAM >=32 RESISTANT Resistant     Extended ESBL NEGATIVE Sensitive     * MODERATE ESCHERICHIA COLI   Enterococcus faecium - MIC*    AMPICILLIN <=2 SENSITIVE Sensitive     VANCOMYCIN <=0.5 SENSITIVE Sensitive     GENTAMICIN SYNERGY SENSITIVE Sensitive     * MODERATE ENTEROCOCCUS FAECIUM   Klebsiella pneumoniae - MIC*    AMPICILLIN >=32 RESISTANT Resistant     CEFAZOLIN <=4 SENSITIVE Sensitive     CEFEPIME <=1 SENSITIVE Sensitive     CEFTAZIDIME <=1 SENSITIVE Sensitive     CEFTRIAXONE <=1 SENSITIVE Sensitive     CIPROFLOXACIN 1 SENSITIVE Sensitive     GENTAMICIN <=1 SENSITIVE Sensitive     IMIPENEM <=0.25 SENSITIVE Sensitive     TRIMETH/SULFA >=320 RESISTANT Resistant     AMPICILLIN/SULBACTAM 4 SENSITIVE Sensitive     PIP/TAZO 8 SENSITIVE Sensitive     Extended ESBL NEGATIVE Sensitive     * ABUNDANT KLEBSIELLA PNEUMONIAE         Radiology Studies: Dg Sacrum/coccyx  Result Date:  06/16/2019 CLINICAL DATA:  Intra-abdominal fluid collection with drain in place. History of rectal cancer. EXAM: SACRUM AND COCCYX - 2+ VIEW COMPARISON:  June 08, 2019. FINDINGS: There is again noted pigtail drainage catheter in the presacral space placed through right transgluteal approach. No fracture or dislocation is noted. Postsurgical changes are noted in the pelvis. There is noted focal lucency seen involving the midportion of the sacrum which corresponds to abnormality seen on prior CT scan and may represent either osteomyelitis or metastatic disease. IMPRESSION: Pigtail drainage catheter is noted in the presacral space. Focal lucency is seen involving midportion of the sacrum which corresponds to abnormality seen on prior CT scan and may represent either osteomyelitis or metastatic disease. Electronically Signed   By: Marijo Conception M.D.   On: 06/16/2019 14:08   Dg Abd 1 View  Result Date: 06/16/2019 CLINICAL DATA:  Intra-abdominal fluid collection. EXAM: ABDOMEN - 1 VIEW COMPARISON:  June 08, 2019. FINDINGS: The bowel gas pattern is normal. There is continued presence of percutaneous drainage catheter seen in the pelvis as described on prior CT scan. IVC filter is noted. Surgical clips are noted in the pelvis. IMPRESSION: Continued presence of percutaneous drainage catheter seen in the pelvis as noted on prior CT scan. No evidence of bowel  obstruction or ileus. Electronically Signed   By: Marijo Conception M.D.   On: 06/16/2019 13:13        Scheduled Meds: . Chlorhexidine Gluconate Cloth  6 each Topical Daily  . feeding supplement (ENSURE ENLIVE)  237 mL Oral BID BM  . fentaNYL  1 patch Transdermal Q48H   And  . fentaNYL  1 patch Transdermal Q48H   And  . fentaNYL  1 patch Transdermal Q48H  . Gerhardt's butt cream   Topical Q4H  . liver oil-zinc oxide   Topical Q4H  . multivitamin with minerals  1 tablet Oral Daily  . oxyCODONE  60 mg Oral Q3H  . pantoprazole  40 mg Oral BID   . vancomycin  500 mg Oral Q6H   Continuous Infusions: . sodium chloride Stopped (06/11/19 1424)     LOS: 9 days        Hosie Poisson, MD [facility] Pager [phone]  If 7PM-7AM, please contact night-coverage [email or web address] Password [email or web address] 06/16/2019, 5:59 PM

## 2019-06-16 NOTE — Progress Notes (Signed)
Referring Physician(s): Dr. Karleen Hampshire  Supervising Physician: Arne Cleveland  Patient Status:  Jonathan Wright - In-pt  Chief Complaint: Metastatic rectal cancer,  Chronic fistulas, fluid collection  Subjective: States he feels there is increased leakage around his backside.  Assessed by wound care nurse today who questioned whether transgluteal drain remain in position.  Patient reportedly felt a "pop" during repositioning a few days ago.  Continues to have some output through his drain.  Allergies: Patient has no known allergies.  Medications: Prior to Admission medications   Medication Sig Start Date End Date Taking? Authorizing Provider  ALPRAZolam Duanne Moron) 1 MG tablet Take 0.5 tablets (0.5 mg total) by mouth 2 (two) times daily as needed for anxiety. 06/04/19  Yes Derek Jack, MD  fentaNYL (DURAGESIC) 100 MCG/HR Place 3 patches onto the skin every 3 (three) days. 05/29/19  Yes Derek Jack, MD  FLUoxetine (PROZAC) 40 MG capsule TAKE 1 CAPSULE(40 MG) BY MOUTH DAILY 04/21/19  Yes Derek Jack, MD  Oxycodone HCl 20 MG TABS Take 1 tablet (20 mg total) by mouth every 3 (three) hours as needed (severe pain). 06/04/19  Yes Derek Jack, MD  prochlorperazine (COMPAZINE) 10 MG tablet Take 1 tablet (10 mg total) by mouth every 6 (six) hours as needed for nausea or vomiting. 05/21/19  Yes Derek Jack, MD  sulfamethoxazole-trimethoprim (BACTRIM DS) 800-160 MG tablet Take 1 tablet by mouth 2 (two) times daily. 05/21/19  Yes Derek Jack, MD  feeding supplement, ENSURE ENLIVE, (ENSURE ENLIVE) LIQD Take 237 mLs by mouth 2 (two) times daily between meals. 06/16/19   Hosie Poisson, MD  Hydrocortisone (GERHARDT'S BUTT CREAM) CREA Apply 1 application topically every 4 (four) hours. 06/16/19   Hosie Poisson, MD  liver oil-zinc oxide (DESITIN) 40 % ointment Apply topically every 4 (four) hours. 06/16/19   Hosie Poisson, MD  Multiple Vitamin (MULTIVITAMIN WITH  MINERALS) TABS tablet Take 1 tablet by mouth daily. 06/17/19   Hosie Poisson, MD  ondansetron (ZOFRAN ODT) 4 MG disintegrating tablet Take 1 tablet (4 mg total) by mouth every 8 (eight) hours as needed for nausea or vomiting. Patient not taking: Reported on 06/07/2019 03/03/19   Derek Jack, MD     Vital Signs: BP 110/76    Pulse (!) 102    Temp 98.8 F (37.1 C)    Resp 17    Ht 5\' 10"  (1.778 m)    Wt 156 lb (70.8 kg)    SpO2 100%    BMI 22.38 kg/m   Physical Exam  NAD, alert, resting comfortably. Abdomen:  Areas of leakage noted around open fistulas between the gluteal folds and perineum. TG drain remains in place.  Ski suture has come loose although does not appears to be significantly retracted. Feculent output in JP.   Imaging: Dg Sacrum/coccyx  Result Date: 06/16/2019 CLINICAL DATA:  Intra-abdominal fluid collection with drain in place. History of rectal cancer. EXAM: SACRUM AND COCCYX - 2+ VIEW COMPARISON:  June 08, 2019. FINDINGS: There is again noted pigtail drainage catheter in the presacral space placed through right transgluteal approach. No fracture or dislocation is noted. Postsurgical changes are noted in the pelvis. There is noted focal lucency seen involving the midportion of the sacrum which corresponds to abnormality seen on prior CT scan and may represent either osteomyelitis or metastatic disease. IMPRESSION: Pigtail drainage catheter is noted in the presacral space. Focal lucency is seen involving midportion of the sacrum which corresponds to abnormality seen on prior CT scan and may represent either  osteomyelitis or metastatic disease. Electronically Signed   By: Marijo Conception M.D.   On: 06/16/2019 14:08   Dg Abd 1 View  Result Date: 06/16/2019 CLINICAL DATA:  Intra-abdominal fluid collection. EXAM: ABDOMEN - 1 VIEW COMPARISON:  June 08, 2019. FINDINGS: The bowel gas pattern is normal. There is continued presence of percutaneous drainage catheter seen in  the pelvis as described on prior CT scan. IVC filter is noted. Surgical clips are noted in the pelvis. IMPRESSION: Continued presence of percutaneous drainage catheter seen in the pelvis as noted on prior CT scan. No evidence of bowel obstruction or ileus. Electronically Signed   By: Marijo Conception M.D.   On: 06/16/2019 13:13    Labs:  CBC: Recent Labs    06/10/19 0419 06/12/19 0425 06/13/19 0505 06/14/19 1836 06/15/19 0324  WBC 32.9* 20.0* 15.2*  --  12.8*  HGB 8.9* 8.6* 9.2* 8.2* 9.5*  HCT 26.9* 27.1* 29.4* 26.2* 30.6*  PLT 234 296 314  --  309    COAGS: Recent Labs    06/13/19 0505 06/14/19 0418 06/15/19 0324 06/16/19 0521  INR 1.3* 1.3* 1.2 1.1    BMP: Recent Labs    06/12/19 0425 06/13/19 0505 06/14/19 1844 06/15/19 0324  NA 133* 136 139 136  K 3.6 3.9 3.1* 4.1  CL 106 104 103 104  CO2 21* 23 26 26   GLUCOSE 132* 92 133* 112*  BUN 8 7 7  5*  CALCIUM 7.0* 7.4* 6.2* 7.9*  CREATININE 0.91 0.77 0.55* 0.80  GFRNONAA >60 >60 >60 >60  GFRAA >60 >60 >60 >60    LIVER FUNCTION TESTS: Recent Labs    06/08/19 1539 06/09/19 0406 06/13/19 0505 06/15/19 0324  BILITOT 1.0 0.7 0.4 0.2*  AST 22 18 21  14*  ALT 15 14 13 13   ALKPHOS 106 105 107 111  PROT 3.7* 3.8* 4.3* 4.8*  ALBUMIN 1.1* 1.1* 1.2* 1.4*    Assessment and Plan: 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  Some leakage from around catheter site, minimal.  Feculent material in JP.  Skin suture has come loose and difficult to tell whether drain is retracted.  Discussed with Dr. Vernard Gambles. Ordered 2-view sacrum to assess drain position.   Electronically Signed: Docia Barrier, PA 06/16/2019, 2:43 PM   I spent a total of 15 Minutes at the the patient's bedside AND on the patient's hospital floor or unit, greater than 50% of which was counseling/coordinating care for presacral fluid collection.

## 2019-06-16 NOTE — Consult Note (Signed)
[place] Nurse wound consult note Patient receiving care in [facility] 6N30.  Assisted with assessment by primary RN, Sharee Pimple. Reason for Consult: skin care for rectal fistula Wound type: Patient has a drain at the top of the gluteal fold that the sutures have pulled out of the skin. I have requested that Sharee Pimple ask the attending to have this drain revisited.  Possibly a different type of drain could be inserted?  Also, there is high output liquid effluent from a fistula close to where the anus was and close to the scrotum.  This output is significantly destructive to the skin in the immediate area.  The skin is so burned up right now, it is highly unlikely that a pouch will stick to the area. Dressing procedure/placement/frequency: Alternating use of 40% desitin with Gerhardt's butt cream AFTER cleansing with no rinse spray.  I have put these on alternating 2 hour schedules. Monitor the wound area(s) for worsening of condition such as: Signs/symptoms of infection,  Increase in size,  Development of or worsening of odor, Development of pain, or increased pain at the affected locations.  Notify the medical team if any of these develop.  Thank you for the consult.  Discussed plan of care with the patient and bedside nurse.  [place] nurse will not follow at this time.  Please re-consult the [place] team if needed.  Val Riles, RN, MSN, CWOCN, CNS-BC, pager [phone]

## 2019-06-16 NOTE — TOC Progression Note (Signed)
Transition of Care [facility]) - Progression Note    Patient Details  Name: Jonathan Wright MRN: [id number] Date of Birth: 07/15/1966  [facility] CM/SW Contact  Bartholomew Crews, RN Phone Number: [phone] 06/16/2019, 3:07 PM  Clinical Narrative:    Contacted by [facility] - referral accepted for PT and RN, however, will not have a start of care until the end of the week. MD notified of circumstances. Patient and significant other ok with end of week, but are hopeful to be able to transition home before then. Significant other confident in his caregiving abilities. Patient will need [place] orders for RN and PT with Face to Face. Patient will need PTAR transport at discharge. Updated current NCM of situation. TOC following for transition needs.    Expected Discharge Plan: [place]    Expected Discharge Plan and Services Expected Discharge Plan: [place]   Discharge Planning Services: CM Consult Post Acute Care Choice: [place] arrangements for the past 2 months: Single Family Home                 DME Arranged: [name] DME Agency: NA       HH Arranged: RN, PT           Social Determinants of Health (SDOH) Interventions    Readmission Risk Interventions No flowsheet data found.

## 2019-06-16 NOTE — Progress Notes (Signed)
Imaging reviewed by Dr. Vernard Gambles.  Catheter remains in unchanged position.  Continues to drain as expected.  Flushes easily.  No plans to exchange or reposition drain at this time.  Continue with current management.   Brynda Greathouse, MS RD PA-C

## 2019-06-16 NOTE — Progress Notes (Signed)
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 Pt intubated and requiring vasopressor support and transfused with 2 units of PRBC.  Transferred from [facility] to [facility] for higher level of care. 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 Pt had questions about exercises he could do to reduce edema as well as ability to stand/ambulate, so PT session was split between ambulation and bed-level exercises. The pt was able to demonstrate improvements in ambulation, walking ~40 ft in his room with RW and min guard/supervision with no LOB. The pt does still demo slowed gait speed and reduced B step clearance, but reports no change in pain with mobility. Pt also was able to demo good tolerance for bed-level LE strengthening exercises, with use of sheet for added resistance, and was able to complete exercises without assist. Recommend continued following by skilled PT due to deficits in mobility and endurance as well as general LE strengthening to facilitate mobility and stability following hospitalization.    Follow Up Recommendations  Home health PT;Supervision/Assistance - 24 hour     Equipment Recommendations  Rolling walker with 5" wheels    Recommendations for Other Services       Precautions / Restrictions Precautions Precautions: Fall Precaution Comments: 1 bottom  drain Restrictions Weight Bearing Restrictions: No    Mobility  Bed Mobility Overal bed mobility: Needs Assistance Bed Mobility: Rolling;Sidelying to Sit;Sit to Sidelying Rolling: Min assist Sidelying to sit: Modified independent (Device/Increase time)   Sit to supine: Modified independent (Device/Increase time) Sit to sidelying: Modified independent (Device/Increase time) General bed mobility comments: minA from PT for IV and JP drain management, pt was able to complete all mobility on his own with use of bed rails and increased time.  Transfers Overall transfer level: Needs assistance Equipment used: Rolling walker (2 wheeled) Transfers: Sit to/from Stand Sit to Stand: Min guard         General transfer comment: min guard with supervision of lines and drain, as well as use of RW. Pt was able to stand and maintain balance without physical support from PT, only BUE support from RW  Ambulation/Gait Ambulation/Gait assistance: Min guard Gait Distance (Feet): 40 Feet Assistive device: Rolling walker (2 wheeled) Gait Pattern/deviations: Step-through pattern;Decreased step length - right;Decreased step length - left;Antalgic Gait velocity: slowed Gait velocity interpretation: <1.31 ft/sec, indicative of household ambulator General Gait Details: Pt completed x2 laps around room with use of RW and min guard for safety. Pt was wearing brief that contained drainage to facilitate ambulation without safety risk of drainage. No  LOB with amb today.   Stairs             Wheelchair Mobility    Modified Rankin (Stroke Patients Only)       Balance Overall balance assessment: Needs assistance Sitting-balance support: Bilateral upper extremity supported;Feet supported Sitting balance-Leahy Scale: Good       Standing balance-Leahy Scale:  Poor Standing balance comment:  pt reliant on external support                            Cognition Arousal/Alertness:  Awake/alert Behavior During Therapy: WFL for tasks assessed/performed Overall Cognitive Status: Within Functional Limits for tasks assessed                                        Exercises General Exercises - Lower Extremity Ankle Circles/Pumps: AROM;Both;20 reps;Supine Gluteal Sets: AROM;10 reps;Supine(bridges) Short Arc Quad: AROM;Both;10 reps;Supine Heel Slides: AROM;Both;10 reps;Supine    General Comments General comments (skin integrity, edema, etc.): edema in LLE > RLE      Pertinent Vitals/Pain Pain Assessment: Faces Faces Pain Scale: Hurts little more Pain Location: abdomen, sacral region, LE swelling  Pain Descriptors / Indicators: Aching;Throbbing;Sharp;Shooting Pain Intervention(s): Monitored during session;Repositioned    Home Living                      Prior Function            PT Goals (current goals can now be found in the care plan section) Acute Rehab PT Goals Patient Stated Goal: get up walking again PT Goal Formulation: With patient Time For Goal Achievement: 06/26/19 Potential to Achieve Goals: Good Progress towards PT goals: Progressing toward goals    Frequency    Min 3X/week      PT Plan Current plan remains appropriate    Co-evaluation              AM-PAC PT "6 Clicks" Mobility   Outcome Measure  Help needed turning from your back to your side while in a flat bed without using bedrails?: None Help needed moving from lying on your back to sitting on the side of a flat bed without using bedrails?: A Little Help needed moving to and from a bed to a chair (including a wheelchair)?: A Little Help needed standing up from a chair using your arms (e.g., wheelchair or bedside chair)?: A Little Help needed to walk in hospital room?: A Little Help needed climbing 3-5 steps with a railing? : A Lot 6 Click Score: 18    End of Session Equipment Utilized During Treatment: Gait belt Activity Tolerance: Patient  tolerated treatment well Patient left: in bed;with call bell/phone within reach;with family/visitor present;with bed alarm set Nurse Communication: Mobility status PT Visit Diagnosis: Other abnormalities of gait and mobility (R26.89);Muscle weakness (generalized) (M62.81);Difficulty in walking, not elsewhere classified (R26.2)     Time: 1135-1209 PT Time Calculation (min) (ACUTE ONLY): 34 min  Charges:  $Gait Training: 8-22 mins $Therapeutic Exercise: 8-22 mins                     Mickey Farber, PT, DPT   Acute Rehabilitation Department [phone]    Otho Bellows 06/16/2019, 12:19 PM

## 2019-06-16 NOTE — Progress Notes (Signed)
Daily Progress Note   Patient Name: Jonathan Wright       Date: 06/16/2019 DOB: [birth year]  Age: 53 y.o. MRN#: [id number] Attending Physician: Hosie Poisson, MD Primary Care Physician: Deland Pretty, MD Admit Date: 06/07/2019  Reason for Consultation/Follow-up: Pain control  Subjective: I met today with Jonathan Wright.  [name] was also at the bedside.  He remained in good spirits.  Reports pain has been doing well on current regimen.  He feels the oxycodone dose of 60 mg is as or more effective than p.o. Dilaudid and would prefer to continue with it at the time of discharge.  I talked again with him about methadone as a possible alternative if he continues to have uncontrolled pain on fentanyl patches. For now, he would like to continue with plan for fentanyl 300 mcg/h as his long-acting medication.  Length of Stay: 9  Current Medications: Scheduled Meds:  . Chlorhexidine Gluconate Cloth  6 each Topical Daily  . feeding supplement (ENSURE ENLIVE)  237 mL Oral BID BM  . fentaNYL  1 patch Transdermal Q48H   And  . fentaNYL  1 patch Transdermal Q48H   And  . fentaNYL  1 patch Transdermal Q48H  . Gerhardt's butt cream   Topical Q4H  . liver oil-zinc oxide   Topical Q4H  . multivitamin with minerals  1 tablet Oral Daily  . oxyCODONE  60 mg Oral Q3H  . pantoprazole  40 mg Oral BID  . vancomycin  500 mg Oral Q6H    Continuous Infusions: . sodium chloride Stopped (06/11/19 1424)    PRN Meds: ALPRAZolam, HYDROmorphone (DILAUDID) injection, prochlorperazine  Physical Exam         General: Alert and interactive. Chronically ill appearing Heart: Regular rate and rhythm. No murmur appreciated. Lungs: Good air movement, clear Abdomen: Soft, ileostomy, urostomy, and  pelvic drain noted.  Ext: + edema Skin: Warm and dry Neuro: Grossly intact, nonfocal.   Vital Signs: BP 109/69 (BP Location: Left Arm)   Pulse (!) 106   Temp 98.3 F (36.8 C) (Oral)   Resp 18   Ht _0  (1.778 m)   Wt 70.8 kg   SpO2 100%   BMI 22.38 kg/m  SpO2: SpO2: 100 % O2 Device: O2 Device: Room Air O2 Flow Rate: O2 Flow Rate (  L/min): 3 L/min  Intake/output summary:   Intake/Output Summary (Last 24 hours) at 06/16/2019 1729 Last data filed at 06/16/2019 1638 Gross per 24 hour  Intake 757 ml  Output 600 ml  Net 157 ml   LBM: Last BM Date: 06/16/19 Baseline Weight: Weight: 74 kg Most recent weight: Weight: 70.8 kg       Palliative Assessment/Data:      Patient Active Problem List   Diagnosis Date Noted  . Pseudoaneurysm ([place])   . Other pulmonary embolism without acute cor pulmonale ([place])   . Respiratory arrest ([place]) 06/07/2019  . Goals of care, counseling/discussion 10/11/2018  . Lymphadenopathy, inguinal   . Sepsis ([place]) 06/19/2016  . UTI (urinary tract infection) 06/19/2016  . Perirectal abscess 06/19/2016  . Severe anemia 06/19/2016  . Hyponatremia 06/19/2016  . Depression with anxiety 06/19/2016  . Rectal bleeding 06/19/2016  . Fever of unknown origin (FUO) 06/19/2016  . Sepsis, unspecified organism ([place]) 06/19/2016  . S/P PICC central line placement 01/13/2016  . Rectal cancer ([place]) 12/17/2015  . Internal hemorrhoids 12/10/2013    Palliative Care Assessment & Plan   Patient Profile: 53 y.o. male  with past medical history of depression, metastatic rectal cancer currently followed by Dr. Delton Coombes, chronic pelvic abscess with fistuilization s/p parasacral drain, prior therapy at [facility] and pelvic exenteration at [facility] admitted on 06/07/2019 with sepsis, DVT, subsegmental PE, acute respiratory failure requiring intubation (now extubated).  Palliative care consulted for pain management.    Assessment: Patient Active  Problem List   Diagnosis Date Noted  . Pseudoaneurysm ([place])   . Other pulmonary embolism without acute cor pulmonale ([place])   . Respiratory arrest ([place]) 06/07/2019  . Goals of care, counseling/discussion 10/11/2018  . Lymphadenopathy, inguinal   . Sepsis ([place]) 06/19/2016  . UTI (urinary tract infection) 06/19/2016  . Perirectal abscess 06/19/2016  . Severe anemia 06/19/2016  . Hyponatremia 06/19/2016  . Depression with anxiety 06/19/2016  . Rectal bleeding 06/19/2016  . Fever of unknown origin (FUO) 06/19/2016  . Sepsis, unspecified organism ([place]) 06/19/2016  . S/P PICC central line placement 01/13/2016  . Rectal cancer ([place]) 12/17/2015  . Internal hemorrhoids 12/10/2013     Recommendations/Plan:  Pain: His pain remains better controlled today. Plan for discharge home on fentanyl 300 mcg/h with a patch change every 48 hours and oxycodone 60 mg every 3 hours as needed for breakthrough pain.  I sent these to pharmacy today.  Anxiety/poor sleep: Uses alprazolam as needed at home.  Continue alprazolam as needed at night for sleep.  Goals of Care and Additional Recommendations:  Limitations on Scope of Treatment: Full Scope Treatment  Code Status:    Code Status Orders  (From admission, onward)         Start     Ordered   06/07/19 1855  Full code  Continuous     06/07/19 1857        Code Status History    Date Active Date Inactive Code Status Order ID Comments User Context   06/19/2016 2054 06/23/2016 1309 Full Code [id number]  Vianne Bulls, MD ED   Advance Care Planning Activity       Prognosis:   Unable to determine  Discharge Planning:  To Be Determined  Care plan was discussed with patient, significant other  Thank you for allowing the Palliative Medicine Team to assist in the care of this patient.   Total Time 20 Prolonged Time Billed  No  Greater than 50%  of this time was spent counseling and coordinating care related to the above  assessment and plan.  Micheline Rough, MD  Please contact Palliative Medicine Team phone at [phone] for questions and concerns.

## 2019-06-17 LAB — PROTIME-INR
INR: 1.2 (ref 0.8–1.2)
Prothrombin Time: 15 seconds (ref 11.4–15.2)

## 2019-06-17 MED ORDER — FENTANYL 100 MCG/HR TD PT72
1.0000 | MEDICATED_PATCH | TRANSDERMAL | Status: DC
  Administered 2019-06-17: 1 via TRANSDERMAL

## 2019-06-17 MED ORDER — VANCOMYCIN 50 MG/ML ORAL SOLUTION: 125.0000 mg | Freq: Four times a day (QID) | ORAL | 0 refills | Status: AC

## 2019-06-17 MED ORDER — PANTOPRAZOLE SODIUM 40 MG PO TBEC: 40.0000 mg | DELAYED_RELEASE_TABLET | Freq: Two times a day (BID) | ORAL | 0 refills | Status: DC

## 2019-06-17 MED ORDER — HEPARIN SOD (PORK) LOCK FLUSH 100 UNIT/ML IV SOLN
250.0000 [IU] | INTRAVENOUS | Status: AC | PRN
  Administered 2019-06-17: 250 [IU]
  Filled 2019-06-17: qty 2.5

## 2019-06-17 NOTE — Progress Notes (Signed)
Patient possible discharge today, patient has a double lumen PICC, MD Karleen Hampshire was questioned on the discharge about the double lumen PICC. MD stated wait on the flush and Cap patient may not need PICC line.

## 2019-06-17 NOTE — Progress Notes (Signed)
Responded to spiritual care consult to get AD document signed but spiritual care staff notaary not available at the time of request. Patient being d/c sometime today no one is sure of time.  Patient will form with him and get it done otherwise. I spoke with nurse to contact social work and advised that maybe they could assist.  Chaplain will follow as needed.  Jaclynn Major, [place], [facility], Pager [phone]

## 2019-06-17 NOTE — Progress Notes (Addendum)
Palliative Medicine RN Note: Dr Domingo Cocking attempted to call prior auth for Fentanyl and oxycodone yesterday, but [employer] was closed for the day. I have called this am, but they haven't opened yet, and the message does not say when they open. Will continue to call.  Marjie Skiff Chantea Surace, RN, BSN, [facility] Palliative Medicine Team 06/17/2019 8:01 AM Office [phone]   Palliative Medicine RN Note: [employer] now open. Called [phone] & spoke w Vaughan Basta to initiate prior auth. She will fax needed forms to our office at [phone].  Marjie Skiff Apostolos Blagg, RN, BSN, [facility] Palliative Medicine Team 06/17/2019 8:20 AM Office [phone]   Rec'd forms for prior auth from [place]. Completed forms and faxed back to them at [phone]. Attempted to call [employer] to confirm receipt, but the recording says they are closed for a "departmental event." I called [facility] to update them.  Marjie Skiff Finley Dinkel, RN, BSN, [facility] Palliative Medicine Team 06/17/2019 9:17 AM Office [phone]   Called [facility] to follow up; they have not rec'd PA. I called [employer] back and spoke with [place] C. She states they are working on faxes from 1000 this morning, so this one should be done soon. She is unable to tell me when or to take a verbal pre-auth.   Marjie Skiff Trenee Igoe, RN, BSN, [facility] Palliative Medicine Team 06/17/2019 12:37 PM Office [phone]   Attempted to obtain prior auth electronically. No response yet.  Marjie Skiff Manual Navarra, RN, BSN, [facility] Palliative Medicine Team 06/17/2019 1:01 PM Office [phone]    Rec'd auth for medications, but in the wrong amounts (only 2 pills of oxycodone/day and two fentanyl patches every 3 days). Working on appeal with Seth Bake; she will fax me quantity limit exception forms.  Marjie Skiff Antonio Creswell, RN, BSN, [facility] Palliative Medicine Team 06/17/2019 2:43 PM Office [phone]   Forms did not arrive; seems they were faxed to our team cell phone. I called [employer] again and spoke with Vaughan Basta;  she will fax these to the correct office fax number.  Marjie Skiff Guneet Delpino, RN, BSN, [facility] Palliative Medicine Team 06/17/2019 3:00 PM Office [phone]

## 2019-06-17 NOTE — Progress Notes (Signed)
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 Pt intubated and requiring vasopressor support and transfused with 2 units of PRBC.  Transferred from [facility] to [facility] for higher level of care. 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 Session focus on bed mobility and LB dressing. Provided educiiton on shower DME; pt reports no needs for shower seat at this time but verbalizes understanding of how to proceed with obtaining DME if needs arise. Pt MOD A for LB dressing from supine after posterior pericare; pt able to bridge hips to assist with pulling mesh panties to waist line. Pt likely to DC home today with [place]; feel pt appropriate for DC plan. Will continue to follow acutley per POC.    Follow Up Recommendations  Home health OT;Supervision - Intermittent    Equipment Recommendations  Other (comment)(RW)    Recommendations for Other Services      Precautions / Restrictions Precautions Precautions: Fall Precaution Comments: drain R buttock Restrictions Weight Bearing Restrictions: No       Mobility Bed Mobility Overal bed mobility: Modified Independent Bed Mobility: Rolling Rolling: Supervision         General bed mobility comments: use of rails; rolled x4 to assit with pericare and LB dressing from supine  Transfers Overall transfer level: Needs assistance Equipment  used: Rolling walker (2 wheeled) Transfers: Sit to/from Stand Sit to Stand: Min guard         General transfer comment: declined OOB transfer    Balance Overall balance assessment: Needs assistance Sitting-balance support: Bilateral upper extremity supported;Feet supported Sitting balance-Leahy Scale: Good     Standing balance support: During functional activity;Bilateral upper extremity supported Standing balance-Leahy Scale: Poor                             ADL either performed or assessed with clinical judgement   ADL Overall ADL's : Needs assistance/impaired             Lower Body Bathing: Total assistance;Bed level Lower Body Bathing Details (indicate cue type and reason): total A to posterior pericare post fistula leakage     Lower Body Dressing: Moderate assistance;Bed level Lower Body Dressing Details (indicate cue type and reason): MOD A to don mesh panties in bed; pt able to bridge hips to assist with pulling mesh panties up to waist           Tub/Shower Transfer Details (indicate cue type and reason): discussed shower seat options and use of grab bars to assist with balance in shower; pt reports he feels like he does not need DME currently but verbalized understanding about various options.   General ADL Comments: session limited to bed level d/t pt dc'ing soon and needing clean up after fistula leakage not wanting to transfer OOB; pt with great family support able to assist when needed; pt MOD A LB dressing at bed level; overall, pt  good at directing own care     Vision Baseline Vision/History: Wears glasses Wears Glasses: At all times Vision Assessment?: No apparent visual deficits   Perception     Praxis      Cognition Arousal/Alertness: Awake/alert Behavior During Therapy: WFL for tasks assessed/performed Overall Cognitive Status: Within Functional Limits for tasks assessed                                           Exercises     Shoulder Instructions       General Comments pt partner present throuhgout session providing great support    Pertinent Vitals/ Pain       Pain Assessment: No/denies pain Faces Pain Scale: Hurts little more Pain Location: buttock--fistula/drain site Pain Descriptors / Indicators: Guarding;Sore Pain Intervention(s): Limited activity within patient's tolerance;Monitored during session;Repositioned;Premedicated before session  Home Living                                          Prior Functioning/Environment              Frequency  Min 1X/week        Progress Toward Goals  OT Goals(current goals can now be found in the care plan section)  Progress towards OT goals: Progressing toward goals  Acute Rehab OT Goals Patient Stated Goal: get up walking again OT Goal Formulation: With patient Time For Goal Achievement: 06/27/19 Potential to Achieve Goals: Good  Plan Discharge plan remains appropriate    Co-evaluation                 AM-PAC OT "6 Clicks" Daily Activity     Outcome Measure   Help from another person eating meals?: None Help from another person taking care of personal grooming?: None Help from another person toileting, which includes using toliet, bedpan, or urinal?: A Lot Help from another person bathing (including washing, rinsing, drying)?: A Lot Help from another person to put on and taking off regular upper body clothing?: A Little Help from another person to put on and taking off regular lower body clothing?: A Lot 6 Click Score: 17    End of Session    OT Visit Diagnosis: Unsteadiness on feet (R26.81);Muscle weakness (generalized) (M62.81);Pain   Activity Tolerance Patient tolerated treatment well   Patient Left in bed;with call bell/phone within reach;with family/visitor present;with nursing/sitter in room   Nurse Communication Mobility status        Time: [id number] OT Time Calculation (min):  20 min  Charges: OT General Charges $OT Visit: 1 Visit OT Treatments $Self Care/Home Management : 8-22 mins   [place], [place] [phone] [place] 06/17/2019, 2:34 PM

## 2019-06-17 NOTE — Progress Notes (Signed)
Patient discharged to home with right upper arm PICC in place, per Dr. Karleen Hampshire. Patient and significant other verbalizes understanding of all discharge instructions including fistula care, discharge medications, and follow up MD visits. All questions answered at this time. Awaiting PTAR.

## 2019-06-17 NOTE — Discharge Summary (Signed)
Physician Discharge Summary  [facility] [id number] DOB: 09/29/1965 DOA: 06/07/2019  PCP: Jonathan Pretty, MD  Admit date: 06/07/2019 Discharge date: 06/17/2019  Admitted From: Home.  Disposition:  Home.   Recommendations for Outpatient Follow-up:  1. Follow up with PCP in 1-2 weeks 2. Please obtain BMP/CBC in one week 3. Please follow up with [facility] as recommended.  4. Please follow up with palliative care as outpatient.  5. Please follow up with the surgeon at [facility], Equipment/Devices: wound care.   Discharge Condition:guarded.  CODE STATUS:full code.  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  He was transferred from [facility] to [facility] and admitted by [facility] for further management.  In September 2020 he was found to have foul-smelling discharge leaking from the sites of the draining tubes, 1 placed by IR at [facility], another one placed at [facility] in July 2020.  He was also found to have a urostomy and a colostomy placed in 2019 with multiple revisions and surgeries.  He underwent flap dissection on the right inner thigh in 2019 that continues to drain.  He was admitted by [facility] for sepsis and transferred to [facility] on 06/11/2019. He has completed 7 days of IV meropenem and continues to be on oral vancomycin to complete the course of C. difficile. His pain regimen is being managed by [facility] with palliative care.  Plan to discharge the patient with home care services once they are arranged. Earlier this am, there was increased leakage around one of the drains and felt the drain is disloged by wound care.  IR consulted and sacral imaging ordered which showed Pigtail drainage catheter is noted in the presacral space. Focal lucency is seen involving midportion of the sacrum which corresponds to abnormality seen on prior CT scan and may represent either osteomyelitis or metastatic disease.  The skin sutures have def come loose but IR reviwed the images and felt the catheter remains in place and continues to drain, hence no plans to reposition the drain at this time.    Discharge Diagnoses:  Active Problems:   Sepsis ([place])   Severe anemia   Respiratory arrest ([place])   Pseudoaneurysm ([place])   Other pulmonary embolism without acute cor pulmonale (HCC)  Sepsis with hemorrhagic shock secondary to recurrent pelvic abscesses Resolved. Pelvic abscess cultures revealed E. coli, Klebsiella, Enterobacter sensitive to meropenem. No fever overnight and leukocytosis has improved to 15,000.  Completed 7 days of IV meropenem and plan for discharge home with home care services once they are arranged. Earlier this am, there was increased leakage around one of the drains and felt the drain is disloged by wound care. IR consulted and sacral imaging ordered which showed Pigtail drainage catheter is noted in the presacral space. Focal lucency is seen involving midportion of the sacrum which corresponds to abnormality seen on prior CT scan and may represent either osteomyelitis or metastatic disease.  The skin sutures have def come loose but IR reviwed the images and felt the catheter remains in place and continues to drain, hence no plans to reposition the drain at this time.     Acute left common femoral  DVT and segmental pulmonary emboli S/p IR embolization on 06/08/2019.  S/p IVC filter/2018. Patient denies any chest pain or shortness of breath  Acute blood loss anemia secondary to suspected pseudoaneurysm versus GI bleed Transfuse to keep hemoglobin greater than 7.  Continue with PPI twice daily. Hemoglobin  remains stable.   Recurrent metastatic rectal cancer s/p pelvic exenteration, longstanding chronic pelvic abscesses with fistulization, s/p pelvic radiation with JP drain in place Upsized IR drain. Recommend outpatient follow-up at [facility] Is able to tolerate diet without any nausea vomiting or abdominal pain.  He is on 300 MCG of fentanyl and oxycodone and Dilaudid. Appreciate pain management by palliative care.  AKI probably secondary to hemorrhagic shock Resolved  Currently waiting for home health services to be arranged for discharge in the morning.  Mild hyponatremia:  Resolved with IV Lasix.    C diff infection? Toxigenic C difficile PCR is positive- on oral vancomycin and  complete the course. Continue with oral vancomycin.  To complete 14-day course.   Discharge Instructions  Discharge Instructions    Diet - low sodium heart healthy   Complete by: As directed    Discharge instructions   Complete by: As directed    Please follow up with PCP and [facility] as recommended.   Increase activity slowly   Complete by: As directed      Allergies as of 06/17/2019   No Known Allergies     Medication List    STOP taking these medications   ondansetron 4 MG disintegrating tablet Commonly known as: Zofran ODT   sulfamethoxazole-trimethoprim 800-160 MG tablet Commonly known as: BACTRIM DS     TAKE these medications   ALPRAZolam 1 MG tablet Commonly known as: Xanax Take 0.5 tablets (0.5 mg total) by mouth 2 (two) times daily as needed for anxiety.   feeding supplement (ENSURE ENLIVE) Liqd Take 237 mLs by mouth 2 (two) times daily between meals.   fentaNYL 100 MCG/HR Commonly known as: [place] 3 patches onto the skin every other day. What changed: when to take this   FLUoxetine 40 MG capsule Commonly known as: PROZAC TAKE 1 CAPSULE(40 MG) BY MOUTH DAILY   Gerhardt's butt cream Crea Apply 1 application topically every 4 (four) hours.    liver oil-zinc oxide 40 % ointment Commonly known as: DESITIN Apply topically every 4 (four) hours.   multivitamin with minerals Tabs tablet Take 1 tablet by mouth daily.   oxycodone 30 MG immediate release tablet Commonly known as: ROXICODONE Take 2 tablets (60 mg total) by mouth every 3 (three) hours as needed for severe pain. What changed:   medication strength  how much to take  reasons to take this   pantoprazole 40 MG tablet Commonly known as: PROTONIX Take 1 tablet (40 mg total) by mouth 2 (two) times daily.   prochlorperazine 10 MG tablet Commonly known as: COMPAZINE Take 1 tablet (10 mg total) by mouth every 6 (six) hours as needed for nausea or vomiting.   vancomycin 50 mg/mL  oral solution Commonly known as: VANCOCIN Take 2.5 mLs (125 mg total) by mouth every 6 (six) hours for 4 days.      Follow-up Information    Jyl Heinz, MD. Schedule an appointment as soon as possible for a visit in 3 week(s).   Specialty: Surgery Contact information: [place] [address] [phone]        Jonathan Pretty, MD. Schedule an appointment as soon  as possible for a visit in 1 week(s).   Specialty: Internal Medicine Contact information: [address] [place] [address] [phone]        [facility], [place] Follow up.   Contact information: [place] [address] [phone]          No Known Allergies  Consultations:  Palliative care  IR  gen surgery.    Procedures/Studies: Dg Sacrum/coccyx  Result Date: 06/16/2019 CLINICAL DATA:  Intra-abdominal fluid collection with drain in place. History of rectal cancer. EXAM: SACRUM AND COCCYX - 2+ VIEW COMPARISON:  June 08, 2019. FINDINGS: There is again noted pigtail drainage catheter in the presacral space placed through right transgluteal approach. No fracture or dislocation is noted. Postsurgical changes  are noted in the pelvis. There is noted focal lucency seen involving the midportion of the sacrum which corresponds to abnormality seen on prior CT scan and may represent either osteomyelitis or metastatic disease. IMPRESSION: Pigtail drainage catheter is noted in the presacral space. Focal lucency is seen involving midportion of the sacrum which corresponds to abnormality seen on prior CT scan and may represent either osteomyelitis or metastatic disease. Electronically Signed   By: Marijo Conception M.D.   On: 06/16/2019 14:08   Dg Abd 1 View  Result Date: 06/16/2019 CLINICAL DATA:  Intra-abdominal fluid collection. EXAM: ABDOMEN - 1 VIEW COMPARISON:  June 08, 2019. FINDINGS: The bowel gas pattern is normal. There is continued presence of percutaneous drainage catheter seen in the pelvis as described on prior CT scan. IVC filter is noted. Surgical clips are noted in the pelvis. IMPRESSION: Continued presence of percutaneous drainage catheter seen in the pelvis as noted on prior CT scan. No evidence of bowel obstruction or ileus. Electronically Signed   By: Marijo Conception M.D.   On: 06/16/2019 13:13   Ct Head Wo Contrast  Result Date: 06/08/2019 CLINICAL DATA:  Encephalopathy. EXAM: CT HEAD WITHOUT CONTRAST TECHNIQUE: Contiguous axial images were obtained from the base of the skull through the vertex without intravenous contrast. COMPARISON:  None. FINDINGS: Brain: Prominent CSF density structure in the right posterior fossa is likely an arachnoid cyst, less likely mega cisterna magna. No intracranial hemorrhage, mass effect, or midline shift. No hydrocephalus. The basilar cisterns are patent. No evidence of territorial infarct or acute ischemia. No extra-axial or intracranial fluid collection. Vascular: No hyperdense vessel or unexpected calcification. Skull: No fracture or focal lesion. Sinuses/Orbits: Slight mucosal thickening of ethmoid air cells. No sinus fluid levels. Right mastoid air cells  slightly hypoplastic. Visualized orbits are unremarkable. Other: None. IMPRESSION: No acute intracranial abnormality. 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 Electronically Signed   By: Keith Rake M.D.   On: 06/08/2019 00:48   Ct Angio Chest Pe W And/or Wo Contrast  Result Date: 06/07/2019 CLINICAL DATA:  53 year old male with acute shortness of breath and syncope. EXAM: CT ANGIOGRAPHY CHEST WITH CONTRAST TECHNIQUE: Multidetector CT imaging of the chest was performed using the standard protocol during bolus administration of intravenous contrast. Multiplanar CT image reconstructions and MIPs were obtained to evaluate the vascular anatomy. CONTRAST:  148mL OMNIPAQUE IOHEXOL 350 MG/ML SOLN COMPARISON:  05/29/2018 and prior CTs FINDINGS: Cardiovascular: This is a technically satisfactory study. Segmental pulmonary emboli are identified within the RIGHT LOWER lobe (at least 2 segments) and LEFT UPPER lobe (at least 1 segment), best identified on thin sections. Mild cardiomegaly is noted. There is no evidence of thoracic aortic aneurysm or  pericardial effusion. Mediastinum/Nodes: An endotracheal tube is identified with tip 2 cm above the carina. An NG 2 is noted entering the stomach. No mediastinal mass or enlarged lymph nodes. Thyroid gland is unremarkable. Lungs/Pleura: Mild to moderate bilateral dependent and bibasilar opacities/atelectasis noted. No evidence of pulmonary mass, suspicious nodule, airspace disease, pleural effusion or pneumothorax. Upper Abdomen: No acute abnormality. Musculoskeletal: No acute or suspicious bony abnormality. Review of the MIP images confirms the above findings. IMPRESSION: 1. Segmental pulmonary emboli within the RIGHT LOWER lobe and LEFT UPPER lobe. 2. Mild to moderate bilateral dependent and bibasilar opacities/atelectasis. 3. Mild cardiomegaly. Critical Value/emergent results were called by telephone at the time of interpretation on 06/07/2019 at 3:24 pm  to providerBRIAN MILLER , who verbally acknowledged these results. Electronically Signed   By: Margarette Canada M.D.   On: 06/07/2019 15:29   Ct Abdomen Pelvis W Contrast  Result Date: 06/08/2019 CLINICAL DATA:  Metastatic rectal cancer, sepsis. Syncope. In the media. EXAM: CT ABDOMEN AND PELVIS WITH CONTRAST TECHNIQUE: Multidetector CT imaging of the abdomen and pelvis was performed using the standard protocol following bolus administration of intravenous contrast. Performed in conjunction with CT of the right femur, reported separately. CONTRAST:  26mL OMNIPAQUE IOHEXOL 300 MG/ML  SOLN COMPARISON:  Most recent abdominal CT 03/31/2019. Chest CTA earlier this day. FINDINGS: Lower chest: Dependent basilar opacities have improved from chest CT yesterday. Residual streaky opacities in the left greater than right lower lobe. 5 mm left lower lobe pulmonary nodule series 8, image 14. Enteric tube in place. Hepatobiliary: Hepatic steatosis. No focal lesion. Distended gallbladder without calcified gallstone. No biliary dilatation. No pericholecystic inflammation. Pancreas: No ductal dilatation or inflammation. Spleen: Normal in size without focal abnormality. Adrenals/Urinary Tract: No adrenal nodule. Chronic left renal atrophy. There is excreted IV contrast within both renal collecting systems from prior contrast administration. Prominence of both ureters without frank hydronephrosis. No significant perinephric edema. Urostomy with ileal diversion, slight left ureteral prominence just proximal to the ureteric anastomosis. Cystectomy. Stomach/Bowel: Enteric tube within the stomach, no gastric wall thickening. No small bowel obstruction. Pelvic small bowel loops intermittently related with chronic pelvic fluid collection. Appendix not clearly visualized. Moderate colonic stool burden. Transverse colonic redundancy. Descending colostomy without peristomal hernia. Prior APR. Vascular/Lymphatic: Filling defect within the left  common femoral vein consistent DVT. The left common iliac vein is decompressed just proximal to the iliac confluence. Retroaortic left renal vein. Portal vein is patent. Abdominal aorta is normal in caliber. Rounded enhancing focus in the right presacral space measuring 14 mm is contiguous with the right internal iliac artery in suspicious for pseudoaneurysm, series 7, image 81. No enlarged abdominal lymph nodes. Few prominent bilateral inguinal nodes. Reproductive: Surgically absent prostate gland. Other: The previous left pelvic drainage catheter has been removed. The right transgluteal pelvic drainage catheter remains in place. Pelvic fluid collections now replaced with mottled air, that tracks into the perineum, left posterior upper thigh, and base of the penis. Difficult to delineate pelvic bowel loops from this mild pelvic fluid collection. No upper abdominal free fluid. Musculoskeletal: Heterogeneous appearance of the sacrum and coccyx with mixed density, this may represent radiation necrosis, however infiltrating lesion or osteomyelitis. Stable sclerotic lesion in the right iliac bone. Critical Value/emergent results were called by telephone at the time of interpretation on 06/08/2019 at 3:06 am to Jonathan summer, who verbally acknowledged these results. IMPRESSION: 1. Pelvic fluid collections now replaced with mottled air, air tracks into the perineum and base of the penis.  Findings concerning for necrotizing soft tissue infection. Right-sided drainage catheter remains in place. 2. Filling defect in the left common femoral vein consistent with DVT. The left common iliac vein is decompressed just proximal to the iliac confluence. 3. 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 Recommend vascular surgery or interventional radiology consultation given history of anemia. 4. Improved aeration at the lung bases compared to CT yesterday. 5 mm left lower lobe  pulmonary nodule. Recommend attention on follow-up. 5. Heterogeneous appearance of the sacrum which may represent post radiation change, however metastatic lesion or osteomyelitis also considered. 6. Hepatic steatosis. Electronically Signed   By: Keith Rake M.D.   On: 06/08/2019 03:06   Ir Angiogram Pelvis Selective Or Supraselective  Result Date: 06/09/2019 INDICATION: 53 year old with pulmonary emboli and left lower extremity DVT. Patient has a pelvic pseudoaneurysm that was recently treated and low hemoglobin. Patient is not a candidate for anticoagulation.  EXAM: IVC FILTER PLACEMENT; IVC VENOGRAM; ULTRASOUND FOR VASCULAR ACCESS  Physician: Stephan Minister. Anselm Pancoast, MD  MEDICATIONS: None.  ANESTHESIA/SEDATION: Intubated and sedated from the ICU.  CONTRAST:  154 Visipaque 320 (combination of filter placement and pelvic angiography)  FLUOROSCOPY TIME:  Fluoroscopy Time: 25 minutes, 1,304 mGy (combination of the filter placement and pelvic angiography)  COMPLICATIONS: None immediate.  PROCEDURE: Informed consent was obtained for an IVC venogram and filter placement. Ultrasound demonstrated a patent right common femoral vein. Ultrasound images were obtained for documentation. The right groin was prepped and draped in a sterile fashion. Maximal barrier sterile technique was utilized including caps, mask, sterile gowns, sterile gloves, sterile drape, hand hygiene and skin antiseptic. The skin was anesthetized with 1% lidocaine. A 21 gauge needle was directed into the vein with ultrasound guidance and a micropuncture dilator set was placed. A wire was advanced into the IVC. The filter sheath was advanced over the wire into the IVC. An IVC venogram was performed. Fluoroscopic images were obtained for documentation. A Bard Denali filter was deployed below the lowest renal vein. A follow-up venogram was performed and the vascular sheath was removed with manual compression.  FINDINGS: IVC was patent. Bilateral  renal veins were identified. 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 The filter was deployed below the left renal vein. Follow-up venogram confirmed placement within the IVC and below the renal veins.  IMPRESSION: Successful placement of a retrievable IVC filter.  PLAN: This IVC filter is potentially retrievable. The patient will be assessed for filter retrieval by Interventional Radiology in approximately 8-12 weeks. Further recommendations regarding filter retrieval, continued surveillance or declaration of device permanence, will be made at that time.   Electronically Signed   By: Markus Daft M.D.   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 [facility] with tachycardia and altered mental status. Hemoglobin was 2.7. Patient was found to have small pulmonary emboli and the left lower extremity DVT. CT also demonstrated a pseudoaneurysm in the right pelvis adjacent to the large complex pelvic collection. Patient has been on pressors and requires treatment of the pelvic pseudoaneurysm. Patient also needs an IVC filter because he is not a candidate for anticoagulation at this time. EXAM: PELVIC ARTERIOGRAPHY WITH ANGIOGRAPHY IN THE RIGHT COMMON ILIAC ARTERY, RIGHT INTERNAL ILIAC ARTERY AND RIGHT INTERNAL ILIAC ARTERY  BRANCHES. EMBOLIZATION OF RIGHT INTERNAL ILIAC ARTERY BRANCHES ULTRASOUND GUIDANCE FOR VASCULAR ACCESS MEDICATIONS: None ANESTHESIA/SEDATION: Patient was intubated and already on sedation from the ICU. CONTRAST:  154 mL Visipaque 320 FLUOROSCOPY TIME:  Fluoroscopy Time: 25 minutes, 123456 mGy COMPLICATIONS: None immediate. PROCEDURE: Informed consent was obtained from the patient's son following explanation of the procedure, risks, benefits and alternatives. The  patient's son understands, agrees and consents for the procedure. All questions were addressed. A time out was performed prior to the initiation of the procedure. Both groins were prepped and draped in sterile fashion. Ultrasound confirmed a patent left common femoral artery. Ultrasound image was saved for documentation. Skin was anesthetized with 1% lidocaine. Using ultrasound guidance, 21 gauge needle was directed into the left common femoral artery and micropuncture dilator set was placed. Five French vascular sheath was placed. C2 catheter was used to cannulate the right common iliac artery. Stiff Glidewire was advanced into the right internal iliac artery and the C2 catheter was placed in the distal common iliac artery. Angiography was performed. The right internal iliac artery was selected with a renegade STC microcatheter. Multiple branches were selected and until the branch supplying the pseudoaneurysm was cannulated. Vessel supplying the pseudoaneurysm was very irregular and neither wire nor catheter could be advanced distal to the pseudoaneurysm. The proximal aspect of the feeding vessel was embolized with 2 Soft Interlock coils, both measuring 2 mm x 4 cm. Follow-up angiography confirmed occlusion of the feeding artery. The adjacent branches were selected with the Renegade catheter and additional angiography was performed. An adjacent branch appeared to have some retrograde filling of the pseudoaneurysm. Therefore, this vessel was treated with Gel-Foam slurry embolization. Decreased flow in the vessel following Gel-Foam embolization and no clear evidence for retrograde filling of the pseudoaneurysm following the embolization. Follow-up angiogram was performed through the 5 French catheter. Angiogram through the left groin sheath. Left groin sheath was removed with manual compression. Attempted to use an ExoSeal but the device did not deploy. Hemostasis in left groin following manual compression. FINDINGS:  Pseudoaneurysm was identified from a branch of the right internal iliac artery. Branch appears to be coming off the anterior division. The main feeding artery may represent the right internal pudendal artery. The feeding vessel is very irregular. Wire and catheter could not be advanced distal to the origin of the pseudoaneurysm. Once the catheter was within the feeding vessel, contrast was preferentially refluxing which made it difficult to consider particle embolization of the feeding artery. As a result, the feeding branch was treated with 2 soft interlock coils proximal to the pseudoaneurysm. The feeding vessel was successfully embolized. However, the follow-up angiogram demonstrated delayed retrograde filling in the region of the pseudoaneurysm. Adjacent branches were selected and additional angiography was performed. An adjacent irregular branch which may represent the inferior gluteal artery appeared to be supplying the pseudoaneurysm through retrograde flow. This artery was embolized with Gelfoam slurry until there was pruning of the distal vasculature and slow flow within this vessel. Final pelvic angiogram demonstrated no significant filling of the pseudoaneurysm. IMPRESSION: Pseudoaneurysm originating from a branch of the right internal iliac artery anterior division. The feeding artery was embolized with coils proximal to pseudoaneurysm. Adjacent vessel was treated with a Gel-Foam slurry to decrease the risk of retrograde filling of the pseudoaneurysm. Electronically Signed   By: Markus Daft M.D.   On: 06/08/2019 18:16   Ir Angiogram Selective Each Additional Vessel  Result Date: 06/08/2019 INDICATION: 53 year old male with a history of  rectal cancer and chronic pelvic abscess/necrotic fluid collection with a drain. [facility] to [facility] with tachycardia and altered mental status. Hemoglobin was 2.7. Patient was found to have small pulmonary emboli and the left lower extremity DVT. CT also  demonstrated a pseudoaneurysm in the right pelvis adjacent to the large complex pelvic collection. Patient has been on pressors and requires treatment of the pelvic pseudoaneurysm. Patient also needs an IVC filter because he is not a candidate for anticoagulation at this time. EXAM: PELVIC ARTERIOGRAPHY WITH ANGIOGRAPHY IN THE RIGHT COMMON ILIAC ARTERY, RIGHT INTERNAL ILIAC ARTERY AND RIGHT INTERNAL ILIAC ARTERY BRANCHES. EMBOLIZATION OF RIGHT INTERNAL ILIAC ARTERY BRANCHES ULTRASOUND GUIDANCE FOR VASCULAR ACCESS MEDICATIONS: None ANESTHESIA/SEDATION: Patient was intubated and already on sedation from the ICU. CONTRAST:  154 mL Visipaque 320 FLUOROSCOPY TIME:  Fluoroscopy Time: 25 minutes, 123456 mGy COMPLICATIONS: None immediate. PROCEDURE: Informed consent was obtained from the patient's son following explanation of the procedure, risks, benefits and alternatives. The patient's son understands, agrees and consents for the procedure. All questions were addressed. A time out was performed prior to the initiation of the procedure. Both groins were prepped and draped in sterile fashion. Ultrasound confirmed a patent left common femoral artery. Ultrasound image was saved for documentation. Skin was anesthetized with 1% lidocaine. Using ultrasound guidance, 21 gauge needle was directed into the left common femoral artery and micropuncture dilator set was placed. Five French vascular sheath was placed. C2 catheter was used to cannulate the right common iliac artery. Stiff Glidewire was advanced into the right internal iliac artery and the C2 catheter was placed in the distal common iliac artery. Angiography was performed. The right internal iliac artery was selected with a renegade STC microcatheter. Multiple branches were selected and until the branch supplying the pseudoaneurysm was cannulated. Vessel supplying the pseudoaneurysm was very irregular and neither wire nor catheter could be advanced distal to the  pseudoaneurysm. The proximal aspect of the feeding vessel was embolized with 2 Soft Interlock coils, both measuring 2 mm x 4 cm. Follow-up angiography confirmed occlusion of the feeding artery. The adjacent branches were selected with the Renegade catheter and additional angiography was performed. An adjacent branch appeared to have some retrograde filling of the pseudoaneurysm. Therefore, this vessel was treated with Gel-Foam slurry embolization. Decreased flow in the vessel following Gel-Foam embolization and no clear evidence for retrograde filling of the pseudoaneurysm following the embolization. Follow-up angiogram was performed through the 5 French catheter. Angiogram through the left groin sheath. Left groin sheath was removed with manual compression. Attempted to use an ExoSeal but the device did not deploy. Hemostasis in left groin following manual compression. FINDINGS: Pseudoaneurysm was identified from a branch of the right internal iliac artery. Branch appears to be coming off the anterior division. The main feeding artery may represent the right internal pudendal artery. The feeding vessel is very irregular. Wire and catheter could not be advanced distal to the origin of the pseudoaneurysm. Once the catheter was within the feeding vessel, contrast was preferentially refluxing which made it difficult to consider particle embolization of the feeding artery. As a result, the feeding branch was treated with 2 soft interlock coils proximal to the pseudoaneurysm. The feeding vessel was successfully embolized. However, the follow-up angiogram demonstrated delayed retrograde filling in the region of the pseudoaneurysm. Adjacent branches were selected and additional angiography was performed. An adjacent irregular branch which may represent the inferior gluteal artery appeared to be supplying the pseudoaneurysm through retrograde flow. This artery  was embolized with Gelfoam slurry until there was pruning of the  distal vasculature and slow flow within this vessel. Final pelvic angiogram demonstrated no significant filling of the pseudoaneurysm. IMPRESSION: Pseudoaneurysm originating from a branch of the right internal iliac artery anterior division. The feeding artery was embolized with coils proximal to pseudoaneurysm. Adjacent vessel was treated with a Gel-Foam slurry to decrease the risk of retrograde filling of the pseudoaneurysm. Electronically Signed   By: Markus Daft M.D.   On: 06/08/2019 18:16   Ir Ivc Filter Plmt / S&i /img Guid/mod Sed  Result Date: 06/08/2019 INDICATION: 53 year old with pulmonary emboli and left lower extremity DVT. Patient has a pelvic pseudoaneurysm that was recently treated and low hemoglobin. Patient is not a candidate for anticoagulation. EXAM: IVC FILTER PLACEMENT; IVC VENOGRAM; ULTRASOUND FOR VASCULAR ACCESS Physician: Stephan Minister. Anselm Pancoast, MD MEDICATIONS: None. ANESTHESIA/SEDATION: Intubated and sedated from the ICU. CONTRAST:  154 Visipaque 320 (combination of filter placement and pelvic angiography) FLUOROSCOPY TIME:  Fluoroscopy Time: 25 minutes, 1,304 mGy (combination of the filter placement and pelvic angiography) COMPLICATIONS: None immediate. PROCEDURE: Informed consent was obtained for an IVC venogram and filter placement. Ultrasound demonstrated a patent right common femoral vein. Ultrasound images were obtained for documentation. The right groin was prepped and draped in a sterile fashion. Maximal barrier sterile technique was utilized including caps, mask, sterile gowns, sterile gloves, sterile drape, hand hygiene and skin antiseptic. The skin was anesthetized with 1% lidocaine. A 21 gauge needle was directed into the vein with ultrasound guidance and a micropuncture dilator set was placed. A wire was advanced into the IVC. The filter sheath was advanced over the wire into the IVC. An IVC venogram was performed. Fluoroscopic images were obtained for documentation. A Bard Denali  filter was deployed below the lowest renal vein. A follow-up venogram was performed and the vascular sheath was removed with manual compression. FINDINGS: IVC was patent. Bilateral renal veins were identified. 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 The filter was deployed below the left renal vein. Follow-up venogram confirmed placement within the IVC and below the renal veins. IMPRESSION: Successful placement of a retrievable IVC filter. PLAN: This IVC filter is potentially retrievable. The patient will be assessed for filter retrieval by Interventional Radiology in approximately 8-12 weeks. Further recommendations regarding filter retrieval, continued surveillance or declaration of device permanence, will be made at that time. Electronically Signed   By: Markus Daft M.D.   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 Recent coiling of a bleeding pseudoaneurysm of a branch of the right internal iliac artery. The indwelling presacral abscess drainage catheter requires exchange and up sizing due to leakage fluid around the drain and through the pelvic floor. EXAM: IR CATHETER TUBE CHANGE MEDICATIONS: None ANESTHESIA/SEDATION: None FLUOROSCOPY TIME:  1 minutes and 18 seconds.  35.3 mGy. CONTRAST:  10 mL Omnipaque [id number] COMPLICATIONS: None immediate. PROCEDURE: Informed written consent was obtained from the patient after a thorough discussion of the procedural risks, benefits and alternatives. All questions were addressed. Maximal Sterile Barrier Technique was utilized including caps, mask, sterile gowns, sterile gloves, sterile drape, hand hygiene and skin antiseptic. A timeout was performed prior to the initiation of the procedure. With the patient in a decubitus position, the pre-existing drainage catheter was inspected under fluoroscopy and injected with contrast material. The  catheter was then removed. A 5 French catheter  was advanced through the tract and into the presacral pelvic space. The catheter was removed over a guidewire. A new 55 French percutaneous drainage catheter was advanced into the presacral space. Catheter position was confirmed by fluoroscopy. The catheter was attached to a suction bulb. It was secured at the skin with a Prolene retention suture, StatLock device and overlying dressing. FINDINGS: At the time of the procedure, the pelvic drain had retracted posteriorly with the tip located just posterior to the tip of the sacrum. Catheter size was increased from 12 [place] to 31 French and the new catheter positioned deeper in the presacral pelvis. There was immediate return of bloody fluid from the drain. IMPRESSION: Exchange and up sizing of posterior pelvic drainage catheter. The indwelling 12 French catheter had retracted posterior to the presacral space. A new 14 French drainage catheter was advanced into the presacral space with return of bloody fluid. This drain was attached to a suction bulb. Electronically Signed   By: Aletta Edouard M.D.   On: 06/10/2019 15:46   Ct Femur Right Wo Contrast  Result Date: 06/08/2019 CLINICAL DATA:  Sepsis. 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 COMPARISON:  CT 12/25/2018 FINDINGS: Bones/Joint/Cartilage Heterogeneous appearance of the visualized distal sacrum and coccyx suggesting acute on chronic osteomyelitis. Right hip joint is intact. No fracture or dislocation. No destructive bone lesion of the right femur. Joint spaces of the hip and knee are maintained. No knee joint effusion. Ligaments Suboptimally assessed by CT. Muscles and Tendons Normal right lower extremity muscle bulk without atrophy or fatty infiltration. Tendinous structures  grossly intact. Soft tissues Along the course of right transgluteal drainage catheter in the right peroneal soft tissues is a fluid and air collection measuring approximately 3.8 x 2.1 cm (series 5, image 48) which appears contiguous with the larger intrapelvic collection. Please see dedicated CT of the abdomen and pelvis for further delineation of the intrapelvic findings IMPRESSION: 1. Heterogeneous appearance of the visualized distal sacrum and coccyx compatible with acute on chronic osteomyelitis. 2. Along the course of right transgluteal drainage catheter is a fluid and air collection measuring approximately 3.8 x 2.1 cm which appears contiguous with the larger intrapelvic collection. 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 3. Please see dedicated CT of the abdomen and pelvis for further delineation of the intrapelvic findings. Electronically Signed   By: Davina Poke M.D.   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 Patient presented to the [facility] with tachycardia and altered mental status. Hemoglobin was 2.7. Patient was found to have small pulmonary emboli and the left lower extremity DVT. CT also demonstrated a pseudoaneurysm in the right pelvis adjacent to the large complex pelvic collection. Patient has been on pressors and requires treatment of the pelvic pseudoaneurysm. Patient also needs an IVC filter because he is not a candidate for anticoagulation at this time. EXAM: PELVIC ARTERIOGRAPHY WITH ANGIOGRAPHY IN THE RIGHT COMMON ILIAC ARTERY, RIGHT INTERNAL ILIAC ARTERY AND RIGHT INTERNAL ILIAC ARTERY BRANCHES. EMBOLIZATION OF RIGHT INTERNAL ILIAC ARTERY BRANCHES ULTRASOUND GUIDANCE FOR VASCULAR ACCESS MEDICATIONS: None ANESTHESIA/SEDATION: Patient was intubated and already on sedation from the ICU. CONTRAST:   154 mL Visipaque 320 FLUOROSCOPY TIME:  Fluoroscopy  Time: 25 minutes, 123456 mGy COMPLICATIONS: None immediate. PROCEDURE: Informed consent was obtained from the patient's son following explanation of the procedure, risks, benefits and alternatives. The patient's son understands, agrees and consents for the procedure. All questions were addressed. A time out was performed prior to the initiation of the procedure. Both groins were prepped and draped in sterile fashion. Ultrasound confirmed a patent left common femoral artery. Ultrasound image was saved for documentation. Skin was anesthetized with 1% lidocaine. Using ultrasound guidance, 21 gauge needle was directed into the left common femoral artery and micropuncture dilator set was placed. Five French vascular sheath was placed. C2 catheter was used to cannulate the right common iliac artery. Stiff Glidewire was advanced into the right internal iliac artery and the C2 catheter was placed in the distal common iliac artery. Angiography was performed. The right internal iliac artery was selected with a renegade STC microcatheter. Multiple branches were selected and until the branch supplying the pseudoaneurysm was cannulated. Vessel supplying the pseudoaneurysm was very irregular and neither wire nor catheter could be advanced distal to the pseudoaneurysm. The proximal aspect of the feeding vessel was embolized with 2 Soft Interlock coils, both measuring 2 mm x 4 cm. Follow-up angiography confirmed occlusion of the feeding artery. The adjacent branches were selected with the Renegade catheter and additional angiography was performed. An adjacent branch appeared to have some retrograde filling of the pseudoaneurysm. Therefore, this vessel was treated with Gel-Foam slurry embolization. Decreased flow in the vessel following Gel-Foam embolization and no clear evidence for retrograde filling of the pseudoaneurysm following the embolization. Follow-up angiogram was  performed through the 5 French catheter. Angiogram through the left groin sheath. Left groin sheath was removed with manual compression. Attempted to use an ExoSeal but the device did not deploy. Hemostasis in left groin following manual compression. FINDINGS: Pseudoaneurysm was identified from a branch of the right internal iliac artery. Branch appears to be coming off the anterior division. The main feeding artery may represent the right internal pudendal artery. The feeding vessel is very irregular. Wire and catheter could not be advanced distal to the origin of the pseudoaneurysm. Once the catheter was within the feeding vessel, contrast was preferentially refluxing which made it difficult to consider particle embolization of the feeding artery. As a result, the feeding branch was treated with 2 soft interlock coils proximal to the pseudoaneurysm. The feeding vessel was successfully embolized. However, the follow-up angiogram demonstrated delayed retrograde filling in the region of the pseudoaneurysm. Adjacent branches were selected and additional angiography was performed. An adjacent irregular branch which may represent the inferior gluteal artery appeared to be supplying the pseudoaneurysm through retrograde flow. This artery was embolized with Gelfoam slurry until there was pruning of the distal vasculature and slow flow within this vessel. Final pelvic angiogram demonstrated no significant filling of the pseudoaneurysm. IMPRESSION: Pseudoaneurysm originating from a branch of the right internal iliac artery anterior division. The feeding artery was embolized with coils proximal to pseudoaneurysm. Adjacent vessel was treated with a Gel-Foam slurry to decrease the risk of retrograde filling of the pseudoaneurysm. Electronically Signed   By: Markus Daft M.D.   On: 06/08/2019 18:16   Dg Chest Port 1 View  Result Date: 06/07/2019 CLINICAL DATA:  Shortness of breath. Rectal carcinoma. EXAM: PORTABLE CHEST 1  VIEW COMPARISON:  06/19/2016 FINDINGS: Right arm PICC line remains in appropriate position. Both lungs are clear. Heart size is normal. IMPRESSION: No active disease. Electronically Signed   By: Linus Mako  Kris Hartmann M.D.   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 Patient presented to the [facility] with tachycardia and altered mental status. Hemoglobin was 2.7. Patient was found to have small pulmonary emboli and the left lower extremity DVT. CT also demonstrated a pseudoaneurysm in the right pelvis adjacent to the large complex pelvic collection. Patient has been on pressors and requires treatment of the pelvic pseudoaneurysm. Patient also needs an IVC filter because he is not a candidate for anticoagulation at this time. EXAM: PELVIC ARTERIOGRAPHY WITH ANGIOGRAPHY IN THE RIGHT COMMON ILIAC ARTERY, RIGHT INTERNAL ILIAC ARTERY AND RIGHT INTERNAL ILIAC ARTERY BRANCHES. EMBOLIZATION OF RIGHT INTERNAL ILIAC ARTERY BRANCHES ULTRASOUND GUIDANCE FOR VASCULAR ACCESS MEDICATIONS: None ANESTHESIA/SEDATION: Patient was intubated and already on sedation from the ICU. CONTRAST:  154 mL Visipaque 320 FLUOROSCOPY TIME:  Fluoroscopy Time: 25 minutes, 123456 mGy COMPLICATIONS: None immediate. PROCEDURE: Informed consent was obtained from the patient's son following explanation of the procedure, risks, benefits and alternatives. The patient's son understands, agrees and consents for the procedure. All questions were addressed. A time out was performed prior to the initiation of the procedure. Both groins were prepped and draped in sterile fashion. Ultrasound confirmed a patent left common femoral artery. Ultrasound image was saved for documentation. Skin was anesthetized with 1% lidocaine. Using ultrasound guidance, 21 gauge needle was directed  into the left common femoral artery and micropuncture dilator set was placed. Five French vascular sheath was placed. C2 catheter was used to cannulate the right common iliac artery. Stiff Glidewire was advanced into the right internal iliac artery and the C2 catheter was placed in the distal common iliac artery. Angiography was performed. The right internal iliac artery was selected with a renegade STC microcatheter. Multiple branches were selected and until the branch supplying the pseudoaneurysm was cannulated. Vessel supplying the pseudoaneurysm was very irregular and neither wire nor catheter could be advanced distal to the pseudoaneurysm. The proximal aspect of the feeding vessel was embolized with 2 Soft Interlock coils, both measuring 2 mm x 4 cm. Follow-up angiography confirmed occlusion of the feeding artery. The adjacent branches were selected with the Renegade catheter and additional angiography was performed. An adjacent branch appeared to have some retrograde filling of the pseudoaneurysm. Therefore, this vessel was treated with Gel-Foam slurry embolization. Decreased flow in the vessel following Gel-Foam embolization and no clear evidence for retrograde filling of the pseudoaneurysm following the embolization. Follow-up angiogram was performed through the 5 French catheter. Angiogram through the left groin sheath. Left groin sheath was removed with manual compression. Attempted to use an ExoSeal but the device did not deploy. Hemostasis in left groin following manual compression. FINDINGS: Pseudoaneurysm was identified from a branch of the right internal iliac artery. Branch appears to be coming off the anterior division. The main feeding artery may represent the right internal pudendal artery. The feeding vessel is very irregular. Wire and catheter could not be advanced distal to the origin of the pseudoaneurysm. Once the catheter was within the feeding vessel, contrast was preferentially refluxing  which made it difficult to consider particle embolization of the feeding artery. As a result, the feeding branch was treated with 2 soft interlock coils proximal to the pseudoaneurysm. The feeding vessel was successfully embolized. However, the follow-up angiogram demonstrated delayed retrograde filling in the region of the  pseudoaneurysm. Adjacent branches were selected and additional angiography was performed. An adjacent irregular branch which may represent the inferior gluteal artery appeared to be supplying the pseudoaneurysm through retrograde flow. This artery was embolized with Gelfoam slurry until there was pruning of the distal vasculature and slow flow within this vessel. Final pelvic angiogram demonstrated no significant filling of the pseudoaneurysm. IMPRESSION: Pseudoaneurysm originating from a branch of the right internal iliac artery anterior division. The feeding artery was embolized with coils proximal to pseudoaneurysm. Adjacent vessel was treated with a Gel-Foam slurry to decrease the risk of retrograde filling of the pseudoaneurysm. Electronically Signed   By: Markus Daft M.D.   On: 06/08/2019 18:16   Vas [place] Lower Extremity Venous (dvt)  Result Date: 06/10/2019  Lower Venous Study Indications: Swelling, and pulmonary embolism.  Risk Factors: Cancer Colorectal cancer. pseudoaneurysm of the iliac artery repaired 10/18. Limitations: Bandages. Comparison Study: No prior study on file Performing Technologist: Sharion Dove RVS  Examination Guidelines: A complete evaluation includes B-mode imaging, spectral Doppler, color Doppler, and power Doppler as needed of all accessible portions of each vessel. Bilateral testing is considered an integral part of a complete examination. Limited examinations for reoccurring indications may be performed as noted.  +---------+---------------+---------+-----------+----------+--------------+ RIGHT    CompressibilityPhasicitySpontaneityPropertiesThrombus  Aging +---------+---------------+---------+-----------+----------+--------------+ CFV      Full           Yes      Yes                                 +---------+---------------+---------+-----------+----------+--------------+ SFJ      Full                                                        +---------+---------------+---------+-----------+----------+--------------+ FV Prox  Full                                                        +---------+---------------+---------+-----------+----------+--------------+ FV Mid   Full                                                        +---------+---------------+---------+-----------+----------+--------------+ FV DistalFull                                                        +---------+---------------+---------+-----------+----------+--------------+ PFV      Full                                                        +---------+---------------+---------+-----------+----------+--------------+ POP      Full  Yes      Yes                                 +---------+---------------+---------+-----------+----------+--------------+ PTV      Full                                                        +---------+---------------+---------+-----------+----------+--------------+ PERO     Full                                                        +---------+---------------+---------+-----------+----------+--------------+   +---------+---------------+---------+-----------+----------+--------------+ LEFT     CompressibilityPhasicitySpontaneityPropertiesThrombus Aging +---------+---------------+---------+-----------+----------+--------------+ CFV      None                                         Acute          +---------+---------------+---------+-----------+----------+--------------+ SFJ      Full           Yes      Yes                                  +---------+---------------+---------+-----------+----------+--------------+ FV Prox  None                                         Acute          +---------+---------------+---------+-----------+----------+--------------+ FV Mid   None                                         Acute          +---------+---------------+---------+-----------+----------+--------------+ FV DistalNone                                         Acute          +---------+---------------+---------+-----------+----------+--------------+ PFV      None                                         Acute          +---------+---------------+---------+-----------+----------+--------------+ POP      None                                         Acute          +---------+---------------+---------+-----------+----------+--------------+ PTV      None  Acute          +---------+---------------+---------+-----------+----------+--------------+ PERO     None                                         Acute          +---------+---------------+---------+-----------+----------+--------------+     Summary: Right: There is no evidence of deep vein thrombosis in the lower extremity. Left: Findings consistent with acute deep vein thrombosis involving the left common femoral vein, left femoral vein, left proximal profunda vein, left popliteal vein, left posterior tibial veins, and left peroneal veins. Unable to image EIV and CIV secondary to abdmonial bandaging and recent surgeries  *See table(s) above for measurements and observations. Electronically signed by Ruta Hinds MD on 06/10/2019 at 11:21:51 AM.    Final        Subjective: No new complaints.   Discharge Exam: Vitals:   06/16/19 2137 06/17/19 0536  BP: 93/75 114/70  Pulse: (!) 108 (!) 107  Resp: 18 18  Temp: 98.8 F (37.1 C) 99 F (37.2 C)  SpO2: 99% 100%   Vitals:   06/16/19 0357 06/16/19 1637 06/16/19 2137  06/17/19 0536  BP: 110/76 109/69 93/75 114/70  Pulse: (!) 102 (!) 106 (!) 108 (!) 107  Resp: 17 18 18 18   Temp: 98.8 F (37.1 C) 98.3 F (36.8 C) 98.8 F (37.1 C) 99 F (37.2 C)  TempSrc:  Oral  Oral  SpO2: 100% 100% 99% 100%  Weight:      Height:        General: Pt is alert, awake, not in acute distress Cardiovascular: RRR, S1/S2 +, no rubs, no gallops Respiratory: CTA bilaterally, no wheezing, no rhonchi Abdominal: Soft, NT, ND, bowel sounds + Extremities: no edema, no cyanosis    The results of significant diagnostics from this hospitalization (including imaging, microbiology, ancillary and laboratory) are listed below for reference.     Microbiology: Recent Results (from the past 240 hour(s))  SARS Coronavirus 2 by RT PCR ([facility] order, performed in [facility]) Nasopharyngeal Nasopharyngeal Swab     Status: None   Collection Time: 06/07/19  3:50 PM   Specimen: Nasopharyngeal Swab  Result Value Ref Range Status   SARS Coronavirus 2 NEGATIVE NEGATIVE Final    Comment: (NOTE) If result is NEGATIVE SARS-CoV-2 target nucleic acids are NOT DETECTED. The SARS-CoV-2 RNA is generally detectable in upper and lower  respiratory specimens during the acute phase of infection. The lowest  concentration of SARS-CoV-2 viral copies this assay can detect is 250  copies / mL. A negative result does not preclude SARS-CoV-2 infection  and should not be used as the sole basis for treatment or other  patient management decisions.  A negative result may occur with  improper specimen collection / handling, submission of specimen other  than nasopharyngeal swab, presence of viral mutation(s) within the  areas targeted by this assay, and inadequate number of viral copies  (<250 copies / mL). A negative result must be combined with clinical  observations, patient history, and epidemiological information. If result is POSITIVE SARS-CoV-2 target nucleic acids are DETECTED. The  SARS-CoV-2 RNA is generally detectable in upper and lower  respiratory specimens dur ing the acute phase of infection.  Positive  results are indicative of active infection with SARS-CoV-2.  Clinical  correlation with patient history and other diagnostic information is  necessary  to determine patient infection status.  Positive results do  not rule out bacterial infection or co-infection with other viruses. If result is PRESUMPTIVE POSTIVE SARS-CoV-2 nucleic acids MAY BE PRESENT.   A presumptive positive result was obtained on the submitted specimen  and confirmed on repeat testing.  While 2019 novel coronavirus  (SARS-CoV-2) nucleic acids may be present in the submitted sample  additional confirmatory testing may be necessary for epidemiological  and / or clinical management purposes  to differentiate between  SARS-CoV-2 and other Sarbecovirus currently known to infect humans.  If clinically indicated additional testing with an alternate test  methodology [phone]) is advised. The SARS-CoV-2 RNA is generally  detectable in upper and lower respiratory sp ecimens during the acute  phase of infection. The expected result is Negative. Fact Sheet for Patients:  StrictlyIdeas.no Fact Sheet for Healthcare Providers: BankingDealers.co.za This test is not yet approved or cleared by the [place] FDA and has been authorized for detection and/or diagnosis of SARS-CoV-2 by FDA under an Emergency Use Authorization (EUA).  This EUA will remain in effect (meaning this test can be used) for the duration of the COVID-19 declaration under Section 564(b)(1) of the Act, 21 U.S.C. section 360bbb-3(b)(1), unless the authorization is terminated or revoked sooner. Performed at [facility], [address], [place], [place] [address]   MRSA PCR Screening     Status: None   Collection Time: 06/07/19  6:01 PM   Specimen: Nasopharyngeal  Result Value Ref  Range Status   MRSA by PCR NEGATIVE NEGATIVE Final    Comment:        The GeneXpert MRSA Assay (FDA approved for NASAL specimens only), is one component of a comprehensive MRSA colonization surveillance program. It is not intended to diagnose MRSA infection nor to guide or monitor treatment for MRSA infections. Performed at [facility], [place] [address], [place], [place] [address]   C difficile quick scan w PCR reflex     Status: Abnormal   Collection Time: 06/07/19  8:24 PM   Specimen: STOOL  Result Value Ref Range Status   C Diff antigen POSITIVE (A) NEGATIVE Final   C Diff toxin NEGATIVE NEGATIVE Final   C Diff interpretation Results are indeterminate. See PCR results.  Final    Comment: Performed at [facility], [place] [address] [place], [place] [address]  C. Diff by PCR, Reflexed     Status: Abnormal   Collection Time: 06/07/19  8:24 PM  Result Value Ref Range Status   Toxigenic C. Difficile by PCR POSITIVE (A) NEGATIVE Final    Comment: Positive for toxigenic C. difficile with little to no toxin production. Only treat if clinical presentation suggests symptomatic illness. Performed at [facility], [place] [address], [place], [place] [address]   Aerobic/Anaerobic Culture (surgical/deep wound)     Status: None   Collection Time: 06/07/19  8:35 PM   Specimen: Fluid; Other  Result Value Ref Range Status   Specimen Description FLUID  Final   Special Requests JP DRAIN FLUID FROM RIGHT BUTTOCK  Final   Gram Stain   Final    RARE WBC PRESENT, PREDOMINANTLY PMN ABUNDANT GRAM NEGATIVE RODS FEW GRAM POSITIVE COCCI FEW GRAM POSITIVE RODS Performed at [facility], [place] [address], [place],  [address]    Culture   Final    ABUNDANT KLEBSIELLA PNEUMONIAE MODERATE ENTEROCOCCUS FAECIUM MODERATE ESCHERICHIA COLI MIXED ANAEROBIC FLORA PRESENT.  CALL LAB IF FURTHER IID REQUIRED.  Report Status 06/11/2019 FINAL  Final   Organism ID, Bacteria KLEBSIELLA  PNEUMONIAE  Final   Organism ID, Bacteria ENTEROCOCCUS FAECIUM  Final   Organism ID, Bacteria ESCHERICHIA COLI  Final      Susceptibility   Escherichia coli - MIC*    AMPICILLIN >=32 RESISTANT Resistant     CEFAZOLIN <=4 SENSITIVE Sensitive     CEFEPIME <=1 SENSITIVE Sensitive     CEFTAZIDIME 4 SENSITIVE Sensitive     CEFTRIAXONE <=1 SENSITIVE Sensitive     CIPROFLOXACIN >=4 RESISTANT Resistant     GENTAMICIN <=1 SENSITIVE Sensitive     IMIPENEM <=0.25 SENSITIVE Sensitive     TRIMETH/SULFA >=320 RESISTANT Resistant     AMPICILLIN/SULBACTAM >=32 RESISTANT Resistant     Extended ESBL NEGATIVE Sensitive     * MODERATE ESCHERICHIA COLI   Enterococcus faecium - MIC*    AMPICILLIN <=2 SENSITIVE Sensitive     VANCOMYCIN <=0.5 SENSITIVE Sensitive     GENTAMICIN SYNERGY SENSITIVE Sensitive     * MODERATE ENTEROCOCCUS FAECIUM   Klebsiella pneumoniae - MIC*    AMPICILLIN >=32 RESISTANT Resistant     CEFAZOLIN <=4 SENSITIVE Sensitive     CEFEPIME <=1 SENSITIVE Sensitive     CEFTAZIDIME <=1 SENSITIVE Sensitive     CEFTRIAXONE <=1 SENSITIVE Sensitive     CIPROFLOXACIN 1 SENSITIVE Sensitive     GENTAMICIN <=1 SENSITIVE Sensitive     IMIPENEM <=0.25 SENSITIVE Sensitive     TRIMETH/SULFA >=320 RESISTANT Resistant     AMPICILLIN/SULBACTAM 4 SENSITIVE Sensitive     PIP/TAZO 8 SENSITIVE Sensitive     Extended ESBL NEGATIVE Sensitive     * ABUNDANT KLEBSIELLA PNEUMONIAE     Labs: BNP (last 3 results) No results for input(s): BNP in the last 8760 hours. Basic Metabolic Panel: Recent Labs  Lab 06/12/19 0425 06/13/19 0505 06/14/19 1844 06/15/19 0324 06/16/19 1630  NA 133* 136 139 136 134*  K 3.6 3.9 3.1* 4.1 3.6  CL 106 104 103 104 100  CO2 21* 23 26 26 25   GLUCOSE 132* 92 133* 112* 120*  BUN 8 7 7  5* 8  CREATININE 0.91 0.77 0.55* 0.80 0.93  CALCIUM 7.0* 7.4* 6.2* 7.9* 7.8*   Liver Function Tests: Recent Labs  Lab 06/13/19 0505 06/15/19 0324  AST 21 14*  ALT 13 13  ALKPHOS  107 111  BILITOT 0.4 0.2*  PROT 4.3* 4.8*  ALBUMIN 1.2* 1.4*   No results for input(s): LIPASE, AMYLASE in the last 168 hours. No results for input(s): AMMONIA in the last 168 hours. CBC: Recent Labs  Lab 06/12/19 0425 06/13/19 0505 06/14/19 1836 06/15/19 0324 06/16/19 1630  WBC 20.0* 15.2*  --  12.8* 11.5*  NEUTROABS  --  12.0*  --   --   --   HGB 8.6* 9.2* 8.2* 9.5* 10.2*  HCT 27.1* 29.4* 26.2* 30.6* 32.3*  MCV 93.8 94.2  --  95.3 95.6  PLT 296 314  --  309 413*   Cardiac Enzymes: Recent Labs  Lab 06/11/19 0500 06/11/19 1700 06/12/19 0425 06/12/19 1706 06/13/19 0505  CKTOTAL 26* 89 108 44* 41*   BNP: Invalid input(s): POCBNP CBG: Recent Labs  Lab 06/12/19 1536 06/12/19 1946 06/12/19 2335 06/13/19 0334 06/14/19 1938  GLUCAP 97 114* 91 94 114*   D-Dimer No results for input(s): DDIMER in the last 72 hours. Hgb A1c No results for input(s): HGBA1C in the last 72 hours. Lipid Profile No results for input(s): CHOL, HDL, LDLCALC, TRIG, CHOLHDL,  LDLDIRECT in the last 72 hours. Thyroid function studies No results for input(s): TSH, T4TOTAL, T3FREE, THYROIDAB in the last 72 hours.  Invalid input(s): FREET3 Anemia work up No results for input(s): VITAMINB12, FOLATE, FERRITIN, TIBC, IRON, RETICCTPCT in the last 72 hours. Urinalysis    Component Value Date/Time   COLORURINE YELLOW 06/08/2019 0612   APPEARANCEUR CLEAR 06/08/2019 0612   LABSPEC 1.033 (H) 06/08/2019 0612   PHURINE 8.0 06/08/2019 0612   GLUCOSEU NEGATIVE 06/08/2019 0612   HGBUR NEGATIVE 06/08/2019 0612   BILIRUBINUR NEGATIVE 06/08/2019 0612   KETONESUR NEGATIVE 06/08/2019 0612   PROTEINUR NEGATIVE 06/08/2019 0612   NITRITE NEGATIVE 06/08/2019 0612   LEUKOCYTESUR SMALL (A) 06/08/2019 0612   Sepsis Labs Invalid input(s): PROCALCITONIN,  WBC,  LACTICIDVEN Microbiology Recent Results (from the past 240 hour(s))  SARS Coronavirus 2 by RT PCR ([place] order, performed in [facility]  lab) Nasopharyngeal Nasopharyngeal Swab     Status: None   Collection Time: 06/07/19  3:50 PM   Specimen: Nasopharyngeal Swab  Result Value Ref Range Status   SARS Coronavirus 2 NEGATIVE NEGATIVE Final    Comment: (NOTE) If result is NEGATIVE SARS-CoV-2 target nucleic acids are NOT DETECTED. The SARS-CoV-2 RNA is generally detectable in upper and lower  respiratory specimens during the acute phase of infection. The lowest  concentration of SARS-CoV-2 viral copies this assay can detect is 250  copies / mL. A negative result does not preclude SARS-CoV-2 infection  and should not be used as the sole basis for treatment or other  patient management decisions.  A negative result may occur with  improper specimen collection / handling, submission of specimen other  than nasopharyngeal swab, presence of viral mutation(s) within the  areas targeted by this assay, and inadequate number of viral copies  (<250 copies / mL). A negative result must be combined with clinical  observations, patient history, and epidemiological information. If result is POSITIVE SARS-CoV-2 target nucleic acids are DETECTED. The SARS-CoV-2 RNA is generally detectable in upper and lower  respiratory specimens dur ing the acute phase of infection.  Positive  results are indicative of active infection with SARS-CoV-2.  Clinical  correlation with patient history and other diagnostic information is  necessary to determine patient infection status.  Positive results do  not rule out bacterial infection or co-infection with other viruses. If result is PRESUMPTIVE POSTIVE SARS-CoV-2 nucleic acids MAY BE PRESENT.   A presumptive positive result was obtained on the submitted specimen  and confirmed on repeat testing.  While 2019 novel coronavirus  (SARS-CoV-2) nucleic acids may be present in the submitted sample  additional confirmatory testing may be necessary for epidemiological  and / or clinical management purposes  to  differentiate between  SARS-CoV-2 and other Sarbecovirus currently known to infect humans.  If clinically indicated additional testing with an alternate test  methodology [id number]) is advised. The SARS-CoV-2 RNA is generally  detectable in upper and lower respiratory sp ecimens during the acute  phase of infection. The expected result is Negative. Fact Sheet for Patients:  StrictlyIdeas.no Fact Sheet for Healthcare Providers: BankingDealers.co.za This test is not yet approved or cleared by the [place] FDA and has been authorized for detection and/or diagnosis of SARS-CoV-2 by FDA under an Emergency Use Authorization (EUA).  This EUA will remain in effect (meaning this test can be used) for the duration of the COVID-19 declaration under Section 564(b)(1) of the Act, 21 U.S.C. section 360bbb-3(b)(1), unless the authorization is terminated or revoked  sooner. Performed at [facility], [address], [place], [place] [address]   MRSA PCR Screening     Status: None   Collection Time: 06/07/19  6:01 PM   Specimen: Nasopharyngeal  Result Value Ref Range Status   MRSA by PCR NEGATIVE NEGATIVE Final    Comment:        The GeneXpert MRSA Assay (FDA approved for NASAL specimens only), is one component of a comprehensive MRSA colonization surveillance program. It is not intended to diagnose MRSA infection nor to guide or monitor treatment for MRSA infections. Performed at [facility], [place] [address] [place], [place] [address]   C difficile quick scan w PCR reflex     Status: Abnormal   Collection Time: 06/07/19  8:24 PM   Specimen: STOOL  Result Value Ref Range Status   C Diff antigen POSITIVE (A) NEGATIVE Final   C Diff toxin NEGATIVE NEGATIVE Final   C Diff interpretation Results are indeterminate. See PCR results.  Final    Comment: Performed at [facility], [place] [address], [place], [place] [address]  C. Diff by  PCR, Reflexed     Status: Abnormal   Collection Time: 06/07/19  8:24 PM  Result Value Ref Range Status   Toxigenic C. Difficile by PCR POSITIVE (A) NEGATIVE Final    Comment: Positive for toxigenic C. difficile with little to no toxin production. Only treat if clinical presentation suggests symptomatic illness. Performed at [facility], [place] [address] [place], [place] [address]   Aerobic/Anaerobic Culture (surgical/deep wound)     Status: None   Collection Time: 06/07/19  8:35 PM   Specimen: Fluid; Other  Result Value Ref Range Status   Specimen Description FLUID  Final   Special Requests JP DRAIN FLUID FROM RIGHT BUTTOCK  Final   Gram Stain   Final    RARE WBC PRESENT, PREDOMINANTLY PMN ABUNDANT GRAM NEGATIVE RODS FEW GRAM POSITIVE COCCI FEW GRAM POSITIVE RODS Performed at [facility], [place] [address], [place], [place] [address]    Culture   Final    ABUNDANT KLEBSIELLA PNEUMONIAE MODERATE ENTEROCOCCUS FAECIUM MODERATE ESCHERICHIA COLI MIXED ANAEROBIC FLORA PRESENT.  CALL LAB IF FURTHER IID REQUIRED.    Report Status 06/11/2019 FINAL  Final   Organism ID, Bacteria KLEBSIELLA PNEUMONIAE  Final   Organism ID, Bacteria ENTEROCOCCUS FAECIUM  Final   Organism ID, Bacteria ESCHERICHIA COLI  Final      Susceptibility   Escherichia coli - MIC*    AMPICILLIN >=32 RESISTANT Resistant     CEFAZOLIN <=4 SENSITIVE Sensitive     CEFEPIME <=1 SENSITIVE Sensitive     CEFTAZIDIME 4 SENSITIVE Sensitive     CEFTRIAXONE <=1 SENSITIVE Sensitive     CIPROFLOXACIN >=4 RESISTANT Resistant     GENTAMICIN <=1 SENSITIVE Sensitive     IMIPENEM <=0.25 SENSITIVE Sensitive     TRIMETH/SULFA >=320 RESISTANT Resistant     AMPICILLIN/SULBACTAM >=32 RESISTANT Resistant     Extended ESBL NEGATIVE Sensitive     * MODERATE ESCHERICHIA COLI   Enterococcus faecium - MIC*    AMPICILLIN <=2 SENSITIVE Sensitive     VANCOMYCIN <=0.5 SENSITIVE Sensitive     GENTAMICIN SYNERGY SENSITIVE Sensitive      * MODERATE ENTEROCOCCUS FAECIUM   Klebsiella pneumoniae - MIC*    AMPICILLIN >=32 RESISTANT Resistant     CEFAZOLIN <=4 SENSITIVE Sensitive     CEFEPIME <=1 SENSITIVE Sensitive     CEFTAZIDIME <=1  SENSITIVE Sensitive     CEFTRIAXONE <=1 SENSITIVE Sensitive     CIPROFLOXACIN 1 SENSITIVE Sensitive     GENTAMICIN <=1 SENSITIVE Sensitive     IMIPENEM <=0.25 SENSITIVE Sensitive     TRIMETH/SULFA >=320 RESISTANT Resistant     AMPICILLIN/SULBACTAM 4 SENSITIVE Sensitive     PIP/TAZO 8 SENSITIVE Sensitive     Extended ESBL NEGATIVE Sensitive     * ABUNDANT KLEBSIELLA PNEUMONIAE     Time coordinating discharge: 42 minutes  SIGNED:   Hosie Poisson, MD  [facility] 06/17/2019, 3:46 PM Pager   If 7PM-7AM, please contact night-coverage [email or web address]   Addendum:  RN called saying she accidentally removed the fentanyl patches today (1 day early ), so only 134mcg fentanyl patch ordered till tomorrow and to be removed at 5 pm tomorrow prior to applying the new 3 patches.  Discussed with the patient and the RN over the phone.  Hosie Poisson.  Password [email or web address]

## 2019-06-17 NOTE — Progress Notes (Addendum)
Paged Dr. Karleen [place], due to prematurely removing Fentanyl patches. New patches were not to be reapplied until 10/28. Received order for Fentanyl 154mcg- 1 patch to be applied now and to be removed tomorrow 10/28 at 1700 prior to applying 3 new patches. This conversation was had on speaker phone with Dr. Karleen [place], Royetta Crochet, and Towanda Octave, RN.   New AVS printed for patient and medication administration date and times are listed on AVS.   Patient transported home via [place].

## 2019-06-17 NOTE — TOC Progression Note (Addendum)
Transition of Care [facility]) - Progression Note    Patient Details  Name: MICHAELRAY WESTERMANN MRN: [id number] Date of Birth: 13-Apr-1966  [facility] CM/SW Contact  Jacalyn Lefevre Edson Snowball, RN Phone Number: 06/17/2019, 12:50 PM  Clinical Narrative:   [id number] Called PTAR  Confirmed address with patient and partner at bedside [place]. Requesting [facility] home. Confirmed with [facility] they can accept for HHRN/PT as arranged by previous case manger . Requested orders from MD for home health , awaiting decision on PICC , will need to know when needs to be flushed , messaged MD  Malachy Mood with [facility] unaware of PICC line . Spoke with Dr Karleen [place] regarding PICC, patient's cancer doctor will take care of PICC line . [facility] aware.  Expected Discharge Plan: [place]    Expected Discharge Plan and Services Expected Discharge Plan: [place]   Discharge Planning Services: CM Consult Post Acute Care Choice: [facility] arrangements for the past 2 months: Single Family Home Expected Discharge Date: 06/17/19               DME Arranged: [name] DME Agency: NA       HH Arranged: RN, PT           Social Determinants of Health (SDOH) Interventions    Readmission Risk Interventions No flowsheet data found.

## 2019-06-17 NOTE — Progress Notes (Signed)
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 Pt intubated and requiring vasopressor support and transfused with 2 units of PRBC.  Transferred from [facility] to [facility] for higher level of care. 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 Pt is making gradual progress toward PT goals and motivated to continue working with therapy. Pt requires min guard for OOB mobility using RW. Pt with SOB and c/o nausea after ~75 ft gait distance in room and returned to supine. Symptoms resolved after rest. Pt will continue to benefit from further skilled PT services to maximize independence and safety with mobility.     Follow Up Recommendations  Home health PT;Supervision/Assistance - 24 hour     Equipment Recommendations  Rolling walker with 5" wheels    Recommendations for Other Services       Precautions / Restrictions Precautions Precautions: Fall Precaution Comments: drain R buttock Restrictions Weight Bearing Restrictions: No    Mobility  Bed Mobility Overal bed mobility: Modified Independent Bed Mobility: Rolling;Sidelying to Sit;Sit to Sidelying           General bed mobility comments: use of rails  Transfers Overall transfer level: Needs assistance Equipment used: Rolling walker (2 wheeled) Transfers: Sit to/from Stand Sit to Stand: Min guard         General transfer comment: min guard  for safety; no physcial assist to stand; cues for safe hand placement  Ambulation/Gait Ambulation/Gait assistance: Min guard Gait Distance (Feet): (~75 ft in room) Assistive device: Rolling walker (2 wheeled) Gait Pattern/deviations: Step-through pattern;Decreased step length - right;Decreased step length - left Gait velocity: decreased   General Gait Details: pt with guarded movements and heavy reliance on bilat UE support; pt with SOB and c/o nausea so gait distance limited and pt returned to bed; SpO2 desat to mid 80s while ambulating and pt tends to hold his breath; pt educated on PLB while mobilizing; SpO2 up to 97% after return to supine    Stairs             Wheelchair Mobility    Modified Rankin (Stroke Patients Only)       Balance Overall balance assessment: Needs assistance Sitting-balance support: Bilateral upper extremity supported;Feet supported Sitting balance-Leahy Scale: Good     Standing balance support: During functional activity;Bilateral upper extremity supported Standing balance-Leahy Scale: Poor                              Cognition Arousal/Alertness: Awake/alert Behavior During Therapy: WFL for tasks assessed/performed Overall Cognitive Status: Within Functional Limits for tasks assessed                                        Exercises      General Comments General comments (skin integrity, edema, etc.): pt on RA; L foot  edema present which pt reports is improving; verbally reviewed LE therex      Pertinent Vitals/Pain Pain Assessment: Faces Faces Pain Scale: Hurts little more Pain Location: buttock--fistula/drain site Pain Descriptors / Indicators: Guarding;Sore Pain Intervention(s): Limited activity within patient's tolerance;Monitored during session;Repositioned;Premedicated before session    Home Living                      Prior Function            PT Goals (current goals can now be  found in the care plan section) Progress towards PT goals: Progressing toward goals    Frequency    Min 3X/week      PT Plan Current plan remains appropriate    Co-evaluation              AM-PAC PT "6 Clicks" Mobility   Outcome Measure  Help needed turning from your back to your side while in a flat bed without using bedrails?: None Help needed moving from lying on your back to sitting on the side of a flat bed without using bedrails?: A Little Help needed moving to and from a bed to a chair (including a wheelchair)?: A Little Help needed standing up from a chair using your arms (e.g., wheelchair or bedside chair)?: A Little Help needed to walk in hospital room?: A Little Help needed climbing 3-5 steps with a railing? : A Lot 6 Click Score: 18    End of Session Equipment Utilized During Treatment: Gait belt Activity Tolerance: Patient tolerated treatment well Patient left: in bed;with call bell/phone within reach Nurse Communication: Mobility status PT Visit Diagnosis: Other abnormalities of gait and mobility (R26.89);Muscle weakness (generalized) (M62.81);Difficulty in walking, not elsewhere classified (R26.2) Pain - part of body: (sacrum abdomen)     Time: [id number] PT Time Calculation (min) (ACUTE ONLY): 28 min  Charges:  $Gait Training: 23-37 mins                     Earney Navy, PTA Acute Rehabilitation Services Pager: [phone] Office: [phone]     Darliss Cheney 06/17/2019, 1:29 PM

## 2019-06-18 ENCOUNTER — Other Ambulatory Visit (HOSPITAL_COMMUNITY): Payer: Self-pay | Admitting: *Deleted

## 2019-06-18 MED ORDER — FENTANYL 100 MCG/HR TD PT72: 3.0000 | MEDICATED_PATCH | TRANSDERMAL | 0 refills | Status: AC

## 2019-06-25 ENCOUNTER — Other Ambulatory Visit (HOSPITAL_COMMUNITY): Payer: Self-pay | Admitting: Nurse Practitioner

## 2019-06-25 ENCOUNTER — Telehealth: Payer: Self-pay

## 2019-06-25 DIAGNOSIS — C20 Malignant neoplasm of rectum: Secondary | ICD-10-CM

## 2019-06-25 MED ORDER — OXYCODONE HCL 30 MG PO TABS: 60.0000 mg | ORAL_TABLET | ORAL | 0 refills | Status: DC | PRN

## 2019-06-25 MED ORDER — FENTANYL 100 MCG/HR TD PT72: 3.0000 | MEDICATED_PATCH | TRANSDERMAL | 0 refills | Status: DC

## 2019-06-25 NOTE — Telephone Encounter (Signed)
Palliative Medicine RN Note: Rec'd a call from pt's partner [name] requesting help with initiating PCA, as patient has changed his mind about pain regimen he discussed with Dr Domingo Cocking while he was admitted to the hospital. Dr Domingo Cocking had offered to make a recommendation to Dr Delton Coombes about pain management but cannot manage his pain outpatient, as we do not have a way for Dr Domingo Cocking to have patient appointments. [name] also reports he will pursue getting a refill from Dr Tomie China office, as prior auths take so long to get.  Dr Domingo Cocking did attempt to call Dr Delton Coombes, who is out of the office this week. Called [name] back and recommended he pursue a refill of current medications, as Jusuf will run out in a few days. Our team will f/u when Dr Delton Coombes returns to the office.  Marjie Skiff Erhard Senske, RN, BSN, [facility] Palliative Medicine Team 06/25/2019 1:06 PM Office [phone]

## 2019-06-30 ENCOUNTER — Telehealth: Payer: Self-pay | Admitting: Hospice and Palliative Medicine

## 2019-06-30 NOTE — Telephone Encounter (Signed)
ADDENDUM:  Once PCA is initiated, would stop the Fentanyl patches (these should be removed at time PCA is hooked up) and would also stop oral oxycodone.  While there will be overlap while the fentanyl is metabolized and the continuous infusion comes to steady state, based upon his short acting usage (equivalent to 720mg  of oral morphine per 24 hours) I am not concerned with him getting too much medication as long as fentanyl and oxycodone are stopped at time of initiation if the PCA pump.

## 2019-06-30 NOTE — Telephone Encounter (Signed)
Palliative Care Progress Note  Received call from patient's significant other/HCPOA, Jonathan Wright.  I called and was able to reach [facility] on 11/8.  He tells me that Jonathan Wright unfortunately continues to have uncontrolled pain and has been largely bedbound since discharge from the hospital.  Jonathan Wright is requesting change that we had discussed in the hospital to home PCA.  Jonathan Wright has continuous pain at baseline but then acute exacerbations with any dressing changes (requires multiple changes daily) and spends hours in bed quiet or sobbing afterward.  Jonathan Wright shared that he knows "things are getting worse" and "I don't know how much time Jonathan Wright has left."  Jonathan Wright does not want him to suffer, but this is what he is seeing.  While [name] is still hopeful for more systemic therapy, Jonathan Wright and I discussed that this may not be the best way to add time and quality to [name]'s life.  They certainly appreciate Dr. Tomie China guidance on this.  I also noted to [facility] that while Jonathan Wright was intubated and successfully extubated, I am concerned that this is not something that he would recover from in the future.  I gently suggested Jonathan Wright would benefit from starting conversation regarding limitations (such as mechanical ventilation, CPR, etc) on care moving forward.  Jonathan Wright expressed understanding but is focused on the immediate need of getting Jonathan Wright better pain management.  Jonathan Wright continues to use Fentanyl 363mcg/hr patches as well as oxycodone 60mg  every 3 hours. This would be roughly equivalent to 1320mg  of oral morphine.   As a starting point, recommend:   Dilaudid PCA  Basal rate of 1mg  per hour Bolus dose: 1mg  Lockout: 15 minutes 1 hour limit of 5mg     I have been in discussion with this with Dr. Delton Coombes as well as Dr. Hilma Favors from the palliative care team.  I have also reached out to [facility] from infusion service and greatly appreciate her assistance.  Will continue to work on logistics of getting  Jonathan Wright set up with PCA as I believe that he will best be served by this moving forward.   Jonathan Rough, MD [place] Team [phone]

## 2019-07-10 ENCOUNTER — Other Ambulatory Visit: Payer: Self-pay

## 2019-07-10 ENCOUNTER — Inpatient Hospital Stay (HOSPITAL_COMMUNITY): Payer: BC Managed Care – PPO

## 2019-07-10 ENCOUNTER — Encounter (HOSPITAL_COMMUNITY): Payer: Self-pay

## 2019-07-10 ENCOUNTER — Emergency Department (HOSPITAL_COMMUNITY): Payer: BC Managed Care – PPO

## 2019-07-10 ENCOUNTER — Observation Stay (HOSPITAL_COMMUNITY)
Admission: EM | Admit: 2019-07-10 | Discharge: 2019-07-11 | Disposition: A | Discharge status: Home / Self Care | Payer: BC Managed Care – PPO | Attending: Internal Medicine | Admitting: Internal Medicine

## 2019-07-10 ENCOUNTER — Encounter: Payer: Self-pay | Admitting: Internal Medicine

## 2019-07-10 DIAGNOSIS — Z79899 Other long term (current) drug therapy: Secondary | ICD-10-CM | POA: Insufficient documentation

## 2019-07-10 DIAGNOSIS — Z932 Ileostomy status: Secondary | ICD-10-CM | POA: Insufficient documentation

## 2019-07-10 DIAGNOSIS — G893 Neoplasm related pain (acute) (chronic): Principal | ICD-10-CM | POA: Diagnosis present

## 2019-07-10 DIAGNOSIS — Z20828 Contact with and (suspected) exposure to other viral communicable diseases: Secondary | ICD-10-CM | POA: Diagnosis not present

## 2019-07-10 DIAGNOSIS — F329 Major depressive disorder, single episode, unspecified: Secondary | ICD-10-CM | POA: Insufficient documentation

## 2019-07-10 DIAGNOSIS — R52 Pain, unspecified: Secondary | ICD-10-CM

## 2019-07-10 DIAGNOSIS — F419 Anxiety disorder, unspecified: Secondary | ICD-10-CM | POA: Diagnosis not present

## 2019-07-10 DIAGNOSIS — Z515 Encounter for palliative care: Secondary | ICD-10-CM

## 2019-07-10 DIAGNOSIS — C20 Malignant neoplasm of rectum: Secondary | ICD-10-CM | POA: Diagnosis not present

## 2019-07-10 DIAGNOSIS — L988 Other specified disorders of the skin and subcutaneous tissue: Secondary | ICD-10-CM

## 2019-07-10 DIAGNOSIS — K651 Peritoneal abscess: Secondary | ICD-10-CM

## 2019-07-10 HISTORY — PX: IR CATHETER TUBE CHANGE: IMG717

## 2019-07-10 LAB — CBC WITH DIFFERENTIAL/PLATELET
Abs Immature Granulocytes: 0.3 10*3/uL — ABNORMAL HIGH (ref 0.00–0.07)
Basophils Absolute: 0.1 10*3/uL (ref 0.0–0.1)
Basophils Relative: 1 %
Eosinophils Absolute: 0.2 10*3/uL (ref 0.0–0.5)
Eosinophils Relative: 1 %
HCT: 29.9 % — ABNORMAL LOW (ref 39.0–52.0)
Hemoglobin: 8.9 g/dL — ABNORMAL LOW (ref 13.0–17.0)
Immature Granulocytes: 1 %
Lymphocytes Relative: 5 %
Lymphs Abs: 1 10*3/uL (ref 0.7–4.0)
MCH: 28.1 pg (ref 26.0–34.0)
MCHC: 29.8 g/dL — ABNORMAL LOW (ref 30.0–36.0)
MCV: 94.3 fL (ref 80.0–100.0)
Monocytes Absolute: 2.5 10*3/uL — ABNORMAL HIGH (ref 0.1–1.0)
Monocytes Relative: 11 %
Neutro Abs: 18.4 10*3/uL — ABNORMAL HIGH (ref 1.7–7.7)
Neutrophils Relative %: 81 %
Platelets: 493 10*3/uL — ABNORMAL HIGH (ref 150–400)
RBC: 3.17 MIL/uL — ABNORMAL LOW (ref 4.22–5.81)
RDW: 17.4 % — ABNORMAL HIGH (ref 11.5–15.5)
WBC: 22.5 10*3/uL — ABNORMAL HIGH (ref 4.0–10.5)
nRBC: 0 % (ref 0.0–0.2)

## 2019-07-10 LAB — COMPREHENSIVE METABOLIC PANEL
ALT: 52 U/L — ABNORMAL HIGH (ref 0–44)
AST: 35 U/L (ref 15–41)
Albumin: 1.9 g/dL — ABNORMAL LOW (ref 3.5–5.0)
Alkaline Phosphatase: 260 U/L — ABNORMAL HIGH (ref 38–126)
Anion gap: 12 (ref 5–15)
BUN: 12 mg/dL (ref 6–20)
CO2: 20 mmol/L — ABNORMAL LOW (ref 22–32)
Calcium: 8.8 mg/dL — ABNORMAL LOW (ref 8.9–10.3)
Chloride: 100 mmol/L (ref 98–111)
Creatinine, Ser: 0.75 mg/dL (ref 0.61–1.24)
GFR calc Af Amer: >60 mL/min (ref >60)
GFR calc non Af Amer: >60 mL/min (ref >60)
Glucose, Bld: 121 mg/dL — ABNORMAL HIGH (ref 70–99)
Potassium: 4.2 mmol/L (ref 3.5–5.1)
Sodium: 132 mmol/L — ABNORMAL LOW (ref 135–145)
Total Bilirubin: 0.4 mg/dL (ref 0.3–1.2)
Total Protein: 6.1 g/dL — ABNORMAL LOW (ref 6.5–8.1)

## 2019-07-10 LAB — SARS CORONAVIRUS 2 (TAT 6-24 HRS): SARS Coronavirus 2: NEGATIVE

## 2019-07-10 LAB — LACTIC ACID, PLASMA
Lactic Acid, Venous: 2.3 mmol/L (ref 0.5–1.9)
Lactic Acid, Venous: 2.1 mmol/L (ref 0.5–1.9)

## 2019-07-10 MED ORDER — DIPHENHYDRAMINE HCL 12.5 MG/5ML PO ELIX: 12.5000 mg | ORAL_SOLUTION | Freq: Four times a day (QID) | ORAL | Status: DC | PRN

## 2019-07-10 MED ORDER — FENTANYL 100 MCG/HR TD PT72: 1.0000 | MEDICATED_PATCH | TRANSDERMAL | Status: DC

## 2019-07-10 MED ORDER — FENTANYL CITRATE (PF) 100 MCG/2ML IJ SOLN
INTRAMUSCULAR | Status: AC
  Filled 2019-07-10: qty 2

## 2019-07-10 MED ORDER — SODIUM CHLORIDE 0.9% FLUSH: 9.0000 mL | INTRAVENOUS | Status: DC | PRN

## 2019-07-10 MED ORDER — MIDAZOLAM HCL 2 MG/2ML IJ SOLN
INTRAMUSCULAR | Status: AC
  Filled 2019-07-10: qty 4

## 2019-07-10 MED ORDER — NALOXONE HCL 0.4 MG/ML IJ SOLN: 0.4000 mg | INTRAMUSCULAR | Status: DC | PRN

## 2019-07-10 MED ORDER — IOHEXOL 300 MG/ML  SOLN
50.0000 mL | Freq: Once | INTRAMUSCULAR | Status: AC | PRN
  Administered 2019-07-10: 18:00:00 20 mL

## 2019-07-10 MED ORDER — ENOXAPARIN SODIUM 40 MG/0.4ML ~~LOC~~ SOLN
40.0000 mg | SUBCUTANEOUS | Status: DC
  Administered 2019-07-10: 21:00:00 40 mg via SUBCUTANEOUS
  Filled 2019-07-10 (×2): qty 0.4

## 2019-07-10 MED ORDER — HYDROMORPHONE 1 MG/ML IV SOLN: INTRAVENOUS | Status: DC

## 2019-07-10 MED ORDER — HYDROMORPHONE 1 MG/ML IV SOLN
INTRAVENOUS | Status: DC
  Administered 2019-07-10 – 2019-07-11 (×5): 30 mg via INTRAVENOUS
  Filled 2019-07-10 (×5): qty 30

## 2019-07-10 MED ORDER — FENTANYL CITRATE (PF) 100 MCG/2ML IJ SOLN
INTRAMUSCULAR | Status: AC | PRN
  Administered 2019-07-10 (×2): 50 ug via INTRAVENOUS

## 2019-07-10 MED ORDER — ONDANSETRON HCL 4 MG/2ML IJ SOLN: 4.0000 mg | Freq: Four times a day (QID) | INTRAMUSCULAR | Status: DC | PRN

## 2019-07-10 MED ORDER — DIPHENHYDRAMINE HCL 50 MG/ML IJ SOLN: 12.5000 mg | Freq: Four times a day (QID) | INTRAMUSCULAR | Status: DC | PRN

## 2019-07-10 MED ORDER — LORAZEPAM 2 MG/ML IJ SOLN: 2.0000 mg | INTRAMUSCULAR | Status: DC | PRN

## 2019-07-10 MED ORDER — SODIUM CHLORIDE 0.9 % IV BOLUS
2000.0000 mL | Freq: Once | INTRAVENOUS | Status: AC
  Administered 2019-07-10: 12:00:00 2000 mL via INTRAVENOUS

## 2019-07-10 MED ORDER — OXYCODONE HCL 5 MG PO TABS
20.0000 mg | ORAL_TABLET | ORAL | Status: DC | PRN
  Administered 2019-07-11 (×3): 20 mg via ORAL
  Filled 2019-07-10 (×3): qty 4

## 2019-07-10 MED ORDER — OXYCODONE HCL 5 MG PO TABS
20.0000 mg | ORAL_TABLET | ORAL | Status: AC
  Administered 2019-07-10: 20 mg via ORAL
  Filled 2019-07-10: qty 4

## 2019-07-10 MED ORDER — IOHEXOL 300 MG/ML  SOLN
100.0000 mL | Freq: Once | INTRAMUSCULAR | Status: AC | PRN
  Administered 2019-07-10: 14:00:00 100 mL via INTRAVENOUS

## 2019-07-10 MED ORDER — LIDOCAINE HCL 1 % IJ SOLN
INTRAMUSCULAR | Status: AC
  Filled 2019-07-10: qty 20

## 2019-07-10 MED ORDER — SODIUM CHLORIDE (PF) 0.9 % IJ SOLN
INTRAMUSCULAR | Status: AC
  Filled 2019-07-10: qty 50

## 2019-07-10 MED ORDER — MIDAZOLAM HCL 2 MG/2ML IJ SOLN
INTRAMUSCULAR | Status: AC | PRN
  Administered 2019-07-10 (×2): 1 mg via INTRAVENOUS

## 2019-07-10 NOTE — ED Notes (Signed)
Transport called to take patient upstairs 

## 2019-07-10 NOTE — ED Provider Notes (Signed)
[facility] DEPT Provider Note   CSN: [id number] Arrival date & time: 07/10/19  1011     History   Chief Complaint Chief Complaint  Patient presents with   Wound Check    urostomy.ostomy not draining    HPI Jonathan Wright is a 53 y.o. male.     HPI   He presents for evaluation of pain in his abdomen, and concerned that his right buttock wound drainage device is not collecting drainage anymore.  He continues to drain from wounds in the lower buttocks/perineal area.  He has had pelvic exenteration, for rectal cancer.  He has diverting ileostomy and urostomy.  Recently, he had the drain from the left buttocks removed.  He feels pressure-like "fluid is building up, inside."  He is on chronic narcotic therapy, through midline, right arm, Dilaudid PCA.  He is under services of palliative care who directed him here for evaluation.  He is not getting pain relief with the PCA, combined with transdermal fentanyl.  He denies fever, chills, nausea, vomiting.  No change in output from urostomy or ileostomy.  His partner helps to change his dressings on his perineal area and states that about every 6 hours he requires dressing changes due to the drainage.  The drainage is increasing in the last few days.  There is been no fever, cough, chest pain or dizziness.  There are no other known modifying factors.  Past Medical History:  Diagnosis Date   Anxiety    Depression    Rectal cancer ([place]) 12/17/2015    Patient Active Problem List   Diagnosis Date Noted   Cancer related pain 07/10/2019   Pseudoaneurysm ([place])    Other pulmonary embolism without acute cor pulmonale (HCC)    Respiratory arrest ([place]) 06/07/2019   Goals of care, counseling/discussion 10/11/2018   Lymphadenopathy, inguinal    Sepsis ([place]) 06/19/2016   UTI (urinary tract infection) 06/19/2016   Perirectal abscess 06/19/2016   Severe anemia 06/19/2016   Hyponatremia 06/19/2016    Depression with anxiety 06/19/2016   Rectal bleeding 06/19/2016   Fever of unknown origin (FUO) 06/19/2016   Sepsis, unspecified organism ([place]) 06/19/2016   S/P PICC central line placement 01/13/2016   Rectal cancer ([place]) 12/17/2015   Internal hemorrhoids 12/10/2013    Past Surgical History:  Procedure Laterality Date   ABDOMINAL PERINEAL BOWEL RESECTION  06/07/2016   [facility]   COLON SURGERY     DIVERTING ILEOSTOMY  06/07/2016   [facility]   INGUINAL LYMPH NODE BIOPSY Left 09/18/2018   Procedure: LEFT INGUINAL LYMPH NODE BIOPSY;  Surgeon: Aviva Signs, MD;  Location: [facility];  Service: General;  Laterality: Left;   IR ANGIOGRAM PELVIS SELECTIVE OR SUPRASELECTIVE  06/08/2019   IR ANGIOGRAM SELECTIVE EACH ADDITIONAL VESSEL  06/08/2019   IR ANGIOGRAM SELECTIVE EACH ADDITIONAL VESSEL  06/08/2019   IR CATHETER TUBE CHANGE  06/10/2019   IR EMBO ART  VEN HEMORR LYMPH EXTRAV  INC GUIDE ROADMAPPING  06/08/2019   IR IVC FILTER PLMT / S&I /IMG GUID/MOD SED  06/08/2019   IR RADIOLOGIST EVAL & MGMT  12/25/2018   IR US GUIDE VASC ACCESS LEFT  06/08/2019   LAPAROSCOPIC LOW ANTERIOR RESCECTION WITH COLOANAL ANASTOMOSIS  06/07/2016   [facility]   PERIPHERALLY INSERTED CENTRAL CATHETER INSERTION  11/2015        Home Medications    Prior to Admission medications   Medication Sig Start Date End Date Taking? Authorizing Provider  ALPRAZolam Duanne Moron)  1 MG tablet Take 0.5 tablets (0.5 mg total) by mouth 2 (two) times daily as needed for anxiety. 06/04/19   Derek Jack, MD  feeding supplement, ENSURE ENLIVE, (ENSURE ENLIVE) LIQD Take 237 mLs by mouth 2 (two) times daily between meals. 06/16/19   Hosie Poisson, MD  fentaNYL (DURAGESIC) 100 MCG/HR Place 3 patches onto the skin every 3 (three) days. 06/18/19   Derek Jack, MD  fentaNYL (DURAGESIC) 100 MCG/HR Place 3 patches onto the skin every other day. 06/25/19   Lockamy, Randi L, NP-C  FLUoxetine  (PROZAC) 40 MG capsule TAKE 1 CAPSULE(40 MG) BY MOUTH DAILY 04/21/19   Derek Jack, MD  Hydrocortisone (GERHARDT'S BUTT CREAM) CREA Apply 1 application topically every 4 (four) hours. 06/16/19   Hosie Poisson, MD  HYDROmorphone PCA 2 mg/mL (DILAUDID) 2 mg/mL SOLN Inject 2 mg into the vein every 4 (four) hours. 63m basal rate, 274mPCA dose q15 prn pain    [provider]  liver oil-zinc oxide (DESITIN) 40 % ointment Apply topically every 4 (four) hours. 06/16/19   AkHosie PoissonMD  Multiple Vitamin (MULTIVITAMIN WITH MINERALS) TABS tablet Take 1 tablet by mouth daily. 06/17/19   AkHosie PoissonMD  oxycodone (ROXICODONE) 30 MG immediate release tablet Take 2 tablets (60 mg total) by mouth every 3 (three) hours as needed. 06/25/19   Lockamy, Randi L, NP-C  pantoprazole (PROTONIX) 40 MG tablet Take 1 tablet (40 mg total) by mouth 2 (two) times daily. 06/17/19   AkHosie PoissonMD  prochlorperazine (COMPAZINE) 10 MG tablet Take 1 tablet (10 mg total) by mouth every 6 (six) hours as needed for nausea or vomiting. 05/21/19   KaDerek JackMD    Family History Family History  Problem Relation Age of Onset   Rectal cancer Neg Hx     Social History Social History   Tobacco Use   Smoking status: Never Smoker   Smokeless tobacco: Never Used  Substance Use Topics   Alcohol use: Yes    Comment: occ.    Drug use: No     Allergies   Patient has no known allergies.   Review of Systems Review of Systems  All other systems reviewed and are negative.    Physical Exam Updated Vital Signs BP 104/62    Pulse (!) 143    Temp 98.8 F (37.1 C) (Oral)    Resp (!) 21    Ht _0  (1.778 m)    Wt 70.3 kg    SpO2 99%    BMI 22.24 kg/m   Physical Exam Vitals signs and nursing note reviewed.  Constitutional:      Appearance: He is well-developed.  HENT:     Head: Normocephalic and atraumatic.     Right Ear: External ear normal.     Left Ear: External ear normal.    Eyes:     Conjunctiva/sclera: Conjunctivae normal.     Pupils: Pupils are equal, round, and reactive to light.  Neck:     Musculoskeletal: Normal range of motion and neck supple.     Trachea: Phonation normal.  Cardiovascular:     Rate and Rhythm: Normal rate and regular rhythm.     Heart sounds: Normal heart sounds.  Pulmonary:     Effort: Pulmonary effort is normal.     Breath sounds: Normal breath sounds.  Abdominal:     General: There is no distension.     Palpations: Abdomen is soft.     Tenderness: There is no  abdominal tenderness.     Comments: Ileostomy and urostomy, sites and appliances appear normal.  Musculoskeletal: Normal range of motion.  Skin:    General: Skin is warm and dry.     Comments: 3 areas of drainage, perineal area 1 midline just below scrotum, kissing lesions of each buttocks, with induration around the buttocks wounds.  There is frank drainage from all wounds, that is purulent.  There is mild erythema and tenderness associated with the draining wounds.  Penis, scrotum and testes appear normal.  Neurological:     Mental Status: He is alert and oriented to person, place, and time.     Cranial Nerves: No cranial nerve deficit.     Sensory: No sensory deficit.     Motor: No abnormal muscle tone.     Coordination: Coordination normal.  Psychiatric:        Mood and Affect: Mood normal.        Behavior: Behavior normal.        Thought Content: Thought content normal.        Judgment: Judgment normal.      ED Treatments / Results  Labs (all labs ordered are listed, but only abnormal results are displayed) Labs Reviewed  COMPREHENSIVE METABOLIC PANEL - Abnormal; Notable for the following components:      Result Value   Sodium 132 (*)    CO2 20 (*)    Glucose, Bld 121 (*)    Calcium 8.8 (*)    Total Protein 6.1 (*)    Albumin 1.9 (*)    ALT 52 (*)    Alkaline Phosphatase 260 (*)    All other components within normal limits  LACTIC ACID, PLASMA -  Abnormal; Notable for the following components:   Lactic Acid, Venous 2.3 (*)    All other components within normal limits  CBC WITH DIFFERENTIAL/PLATELET - Abnormal; Notable for the following components:   WBC 22.5 (*)    RBC 3.17 (*)    Hemoglobin 8.9 (*)    HCT 29.9 (*)    MCHC 29.8 (*)    RDW 17.4 (*)    Platelets 493 (*)    Neutro Abs 18.4 (*)    Monocytes Absolute 2.5 (*)    Abs Immature Granulocytes 0.30 (*)    All other components within normal limits  SARS CORONAVIRUS 2 (TAT 6-24 HRS)  LACTIC ACID, PLASMA    EKG None  Radiology Ct Abdomen Pelvis W Contrast  Addendum Date: 07/10/2019   ADDENDUM REPORT: 07/10/2019 15:14 ADDENDUM: For clarification the potential fistulous tract extending into the abscess cavity, likely extends from bowel in the area of the enteroenteric anastomosis. Drain study could be performed as clinically warranted. Electronically Signed   By: Zetta Bills M.D.   On: 07/10/2019 15:14   Result Date: 07/10/2019 CLINICAL DATA:  Abdominal infection, post drainage placement, now with decreased output EXAM: CT ABDOMEN AND PELVIS WITH CONTRAST TECHNIQUE: Multidetector CT imaging of the abdomen and pelvis was performed using the standard protocol following bolus administration of intravenous contrast. CONTRAST:  169m OMNIPAQUE IOHEXOL 300 MG/ML  SOLN COMPARISON:  06/08/2019 FINDINGS: Lower chest: Lung bases are clear. No pleural or pericardial effusion noted. Hepatobiliary: No focal liver abnormality is seen. No gallstones, gallbladder wall thickening, or biliary dilatation. Pancreas: Unremarkable. No pancreatic ductal dilatation or surrounding inflammatory changes. Spleen: Normal in size without focal abnormality. Adrenals/Urinary Tract: Adrenal glands are unremarkable. Kidneys are normal, without renal calculi, focal lesion, or hydronephrosis. Bladder is unremarkable. Stomach/Bowel: Tethering  of multiple bowel loops in the pelvis with large mass measuring 10 x 6  cm (image 80, series 4), in the left hemipelvis extending from the left pelvic sidewall into the central pelvis displacing soft tissue planes and fatty density seen in this location on prior imaging studies. This is associated debris filled abscess shows decrease in size and is crescentic relative to morphology surrounding the mass centered along the left pelvic sidewall with extension into the central pelvis, measuring approximately 9.0 x 3.2 cm previously approximately 9.4 x 5.0 cm when measured in a similar fashion. The left pelvic component of this process tracks posterior to the dominant mass in the pelvis and extends inferiorly along the mass which favors tissue planes from previous pelvic surgery Gas containing tract extends into the left gluteal fold. Gas tracks along the left inferior pubic ramus towards the base of the penis. 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 Fluid and gas along the left hemipelvis along the inferior margin of the dominant mass measures approximately 7.7 x 3.8 cm. This previously measured approximately 7.9 by 4.9 cm. On today's study there is soft tissue that tracks along the previous left gluteal drain tract (image 85, series 4) this measures approximately 3 cm in greatest thickness previously is measuring approximately 1.5 cm in greatest thickness. Gas tracking along the pelvic sidewall and along the posterior surface of the left acetabulum associated with destructive changes in bone along the ischium with gas tracking towards the left hip joint on today's exam. Stool like material extends into this cavity, perhaps from adjacent enteric loops. There is an presumed tract through the tumor in the mid pelvis (image 75, series 4) that measures approximately 8 mm in greatest width and appears to communicate with bowel in this area at the previous site of bowel anastomosis in the pelvis. There are no signs of bowel obstruction. Vascular/Lymphatic: IVC filter in  situ. Left iliofemoral DVT similar to previous study. No signs of aneurysm in the abdomen. Post coiling of a right internal iliac Signs of mild nodal enlargement in the retroperitoneum. Fat necrosis and signs of nodal enlargement along the left external iliac artery largest lymph node measuring 1.2 cm in short axis in this location. Artery pseudoaneurysm. Reproductive: Signs of pelvic exenteration. Pelvic mass and complex fistulous network with signs of osteomyelitis and recurrent tumor and signs of soft tissue infection now extending in the left gluteal region as outlined above. Addition to this would be worsening of gas within tract, likely matured in communication with the urethra. Left gluteal gas and stranding is similar. Other: Ileal conduit urinary diversion exits via right lower quadrant. Colostomy on the left. Musculoskeletal: Bony changes likely related to osteomyelitis involving the sacrum and left ischium. Signs of soft tissue infection with worsening about the left hip. Changes in the left medial posterior upper thigh are similar to previous exam. Excretory imaging extends through the ileal conduit and shows no obvious leakage of contrast into the pelvic collection. IMPRESSION: 1. 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 Drain injection could be helpful to better illustrate the above findings and show presumed fistulous extension into the urethra and left gluteal and left hip regions. 2. Recurrent tumor with mass in the left hemipelvis in central pelvis potentially with extension along prior drain tract. 3. Signs of osteomyelitis involving the sacrum and left ischium. 4. Signs  of nodal enlargement along the left external iliac artery period. 5. 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 6. IVC filter in situ.  Left iliofemoral DVT. 7. These results were called by telephone at the time of interpretation on 07/10/2019 at 3:04 pm to provider [facility] , who verbally acknowledged these results. Aortic Atherosclerosis (ICD10-I70.0). Electronically Signed: By: Zetta Bills M.D. On: 07/10/2019 15:05    Procedures .Critical Care Performed by: Daleen Bo, MD Authorized by: Daleen Bo, MD   Critical care provider statement:    Critical care time (minutes):  35   Critical care start time:  07/10/2019 12:40 PM   Critical care end time:  07/10/2019 3:20 PM   Critical care time was exclusive of:  Separately billable procedures and treating other patients   Critical care was necessary to treat or prevent imminent or life-threatening deterioration of the following conditions: Rectal cancer, fistula, bowel breakdown.   Critical care was time spent personally by me on the following activities:  Blood draw for specimens, development of treatment plan with patient or surrogate, discussions with consultants, evaluation of patient's response to treatment, examination of patient, obtaining history from patient or surrogate, ordering and performing treatments and interventions, ordering and review of laboratory studies, pulse oximetry, re-evaluation of patient's condition, review of old charts and ordering and review of radiographic studies   (including critical care time)  Medications Ordered in ED Medications  sodium chloride (PF) 0.9 % injection (has no administration in time range)  oxyCODONE (Oxy IR/ROXICODONE) immediate release tablet 20 mg (has no administration in time range)  sodium chloride 0.9 % bolus 2,000 mL (2,000 mLs Intravenous New Bag/Given (Non-Interop) 07/10/19 1155)  iohexol (OMNIPAQUE) 300 MG/ML solution 100 mL (100 mLs Intravenous Contrast Given 07/10/19 1406)     Initial Impression / Assessment and Plan / ED Course  I have reviewed the triage vital signs and the nursing  notes.  Pertinent labs & imaging results that were available during my care of the patient were reviewed by me and considered in my medical decision making (see chart for details).  Clinical Course as of Jul 09 1520  Thu Jul 10, 2019  1516 Mild elevation  Lactic acid, plasma(!!) [EW]  1516 Normal except sodium low, CO2 low, glucose high, calcium low, total protein low, albumin low, ALT high, alk phosphatase high  Comprehensive metabolic panel(!) [EW]  3419 Abnormal, white count high, hemoglobin low, platelets high  CBC with Differential(!) [EW]  1517 Abnormal, see radiology interpretation.  Worsening fluid collection, worsening left pelvic/acetabular bony and soft tissue changes, worsening pelvic tumor.  Suspected fistula/bowel communication with pelvic abscess.  CT Abdomen Pelvis W Contrast [EW]    Clinical Course User Index [EW] Daleen Bo, MD        Patient Vitals for the past 24 hrs:  BP Temp Temp src Pulse Resp SpO2 Height Weight  07/10/19 1500 104/62 -- -- (!) 143 (!) 21 99 % -- --  07/10/19 1438 93/64 -- -- (!) 133 15 98 % -- --  07/10/19 1433 -- -- -- (!) 125 17 100 % -- --  07/10/19 1400 (!) 90/47 -- -- (!) 129 14 100 % -- --  07/10/19 1300 92/67 -- -- (!) 129 17 94 % -- --  07/10/19 1230 93/72 -- -- (!) 124 16 95 % -- --  07/10/19 1205 100/75 -- -- (!) 135 20 99 % -- --  07/10/19 1130 (!) 89/58 -- -- (!) 134 -- 100 % -- --  07/10/19 1103 (!) 88/63 -- -- (!) 119 -- 96 % -- --  07/10/19 1100 (!) 87/55 -- -- (!) 127 -- 98 % -- --  07/10/19 1033 -- -- -- -- -- -- _0  (1.778 m) 70.3 kg  07/10/19 1032 101/66 98.8 F (37.1 C) Oral (!) 137 16 -- -- --  07/10/19 1030 101/66 -- -- (!) 139 -- 100 % -- --  07/10/19 1023 -- -- -- -- -- 99 % -- --    3:00 PM Reevaluation with update and discussion. After initial assessment and treatment, an updated evaluation reveals no change in clinical status, Dr Armandina Gemma is in the room preparing to admit the patient and consult  interventional radiology for help with diagnosis and management of his symptoms. Daleen Bo   Medical Decision Making: Rectal cancer, with worsening pain, draining wounds, pelvic abscess, worsening pelvic tumor, and possible bowel anastomosis breakdown, contributing to drainage.  Patient is critically ill, with worsening cancer, and likely nearing end-of-life.  Palliative care is discussing hospice approach to treatment, with consideration for procedures that could potentially improve ability to manage his symptoms.  It is anticipated he will have a short duration hospitalization to help prepare for end-of-life treatment.  CRITICAL CARE-yes Performed by: Daleen Bo   Nursing Notes Reviewed/ Care Coordinated Applicable Imaging Reviewed Interpretation of Laboratory Data incorporated into ED treatment  Plan: Admit    Final Clinical Impressions(s) / ED Diagnoses   Final diagnoses:  Pelvic abscess in male [facility])  Rectal cancer [facility])  Fistula  Palliative care patient    ED Discharge Orders    None       Daleen Bo, MD 07/10/19 1521

## 2019-07-10 NOTE — Progress Notes (Addendum)
Progress note  Jonathan Wright is well-known to me from prior admission.  Briefly, he is a 53 year old male with history of depression, metastatic rectal cancer who follows with Dr. 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  At home, our team has been working to try to control his cancer pain through use of hydromorphone infusion in conjunction with fentanyl patches.  He has been utilizing 300 mcg/h fentanyl patch in addition to Dilaudid PCA with 2 mg basal rate and 2 mg bolus dosing with a 15-minute lockout.  His cancer related pain has gotten worse and he has had decreased output from his drain raising concern for abscess expansion or tumor growth.  He was recommended by Dr. Hilma Favors to come to the hospital for evaluation versus consideration for enrollment in hospice.  He presented to ED today and I saw him in the ED in conjunction with Dr. Hilma Favors.  We discussed with Jonathan Wright and his partner, Jonathan Wright, regarding likely disease progression and the fact that further disease modifying therapy is not likely to add additional time or quality to his life.  We discussed a plan to focus on aggressive symptom management to help improve his quality of life and functional status.  Discussed utilization of hospice on discharge in order to manage his pain and other symptoms once he transitions back home.  Jonathan Wright reports that he is agreeable to this.  We also discussed that his care moving forward should be focused on interventions that are likely to allow him to achieve goal of getting back to home and spending time with family. Dr. Hilma Favors and I discussed with him regarding heroic interventions at the end-of-life and he agrees this would not be likely to lead to getting well enough to go back home.  He was in agreement with changing CODE STATUS to DO NOT  RESUSCITATE.  For his pain in the hospital, I believe he would best be served by continuation of home fentanyl patches while also increasing PCA dosing.  Home basal dose was 2 mg/h of hydromorphone and will increase this to 4 mg/h.  Continue with 2 mg hydromorphone bolus dosing with a 12-minute lockout.  Overall, his pain management needs are very complex and I would consideration initiation of methadone for long-acting agent.  Would also consider ketamine to possibly be helpful as well.  We will work on see if we can his pain better controlled with increased PCA dosing and evaluation by IR for potential for drainage of any areas causing increased pressure this admission.  Once he is discharged home, plan will be for hospice who will be able to better manage his pain as an outpatient.  Micheline Rough, MD [place] [facility] Team [phone]  NO CHARGE NOTE

## 2019-07-10 NOTE — ED Triage Notes (Addendum)
Pt arrived [facility] from home CC clogged Abd wound drains. Pt reports "abd pressure" "his doctor advised him to come to the ED". Pt has 3 fentanyl 177mcg patches , PCA with dilaudid. Family in waiting room.  Hx Cancer

## 2019-07-10 NOTE — ED Notes (Signed)
Patient transported to CT 

## 2019-07-10 NOTE — ED Notes (Addendum)
Date and time results received: 07/10/19 12:40 PM  (use smartphrase ".now" to insert current time)  Test: Lactic Critical Value: 2.3  Name of Provider Notified: Eulis Foster  Orders Received? Or Actions Taken?: Orders Received - See Orders for details

## 2019-07-10 NOTE — H&P (Signed)
Chief Complaint: Drainage from around tubing  Referring Physician(s): ED  Supervising Physician: Jacqulynn Cadet  Patient Status: [facility] - In-pt  History of Present Illness: Jonathan Wright is a 53 y.o. male who is well known to our service.  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  He has a drain in place which was upsized on 06/10/2019 from a 12 fr to a 14 fr.  He presented to the ED today c/o some abdominal pain and concern that there has been minimal output in the bulb and most of the drainage is from around the tubing.  His partner is with him and states that he is having to change him every 4-6 hours because of the leakage.  They are both requesting a drain upsize.   He other feels ok, he is at baseline. No nausea/vomiting. No Fever/chills. ROS negative. He is NPO since 6 am.   Past Medical History:  Diagnosis Date  . Anxiety   . Depression   . Rectal cancer ([place]) 12/17/2015    Past Surgical History:  Procedure Laterality Date  . ABDOMINAL PERINEAL BOWEL RESECTION  06/07/2016   [facility]  . COLON SURGERY    . DIVERTING ILEOSTOMY  06/07/2016   [facility]  . INGUINAL LYMPH NODE BIOPSY Left 09/18/2018   Procedure: LEFT INGUINAL LYMPH NODE BIOPSY;  Surgeon: Aviva Signs, MD;  Location: [facility];  Service: General;  Laterality: Left;  . IR ANGIOGRAM PELVIS SELECTIVE OR SUPRASELECTIVE  06/08/2019  . IR ANGIOGRAM SELECTIVE EACH ADDITIONAL VESSEL  06/08/2019  . IR ANGIOGRAM SELECTIVE EACH ADDITIONAL VESSEL  06/08/2019  . IR CATHETER TUBE CHANGE  06/10/2019  . IR EMBO ART  VEN HEMORR LYMPH EXTRAV  INC GUIDE ROADMAPPING  06/08/2019  . IR IVC FILTER PLMT / S&I /IMG GUID/MOD SED  06/08/2019  . IR RADIOLOGIST EVAL & MGMT  12/25/2018  . IR US GUIDE VASC ACCESS LEFT  06/08/2019  . LAPAROSCOPIC LOW ANTERIOR RESCECTION WITH COLOANAL ANASTOMOSIS  06/07/2016   [facility]  . PERIPHERALLY INSERTED CENTRAL CATHETER INSERTION   11/2015    Allergies: Patient has no known allergies.  Medications: Prior to Admission medications   Medication Sig Start Date End Date Taking? Authorizing Provider  ALPRAZolam Duanne Moron) 1 MG tablet Take 0.5 tablets (0.5 mg total) by mouth 2 (two) times daily as needed for anxiety. 06/04/19  Yes Derek Jack, MD  feeding supplement, ENSURE ENLIVE, (ENSURE ENLIVE) LIQD Take 237 mLs by mouth 2 (two) times daily between meals. 06/16/19  Yes Hosie Poisson, MD  fentaNYL (DURAGESIC) 100 MCG/HR Place 3 patches onto the skin every other day. 06/25/19  Yes Lockamy, Randi L, NP-C  FLUoxetine (PROZAC) 40 MG capsule TAKE 1 CAPSULE(40 MG) BY MOUTH DAILY Patient taking differently: Take 40 mg by mouth daily.  04/21/19  Yes Derek Jack, MD  HYDROmorphone PCA 2 mg/mL (DILAUDID) 2 mg/mL SOLN Inject 2 mg into the vein every 4 (four) hours. 2mg  basal rate, 2mg  PCA dose q15 prn pain   Yes [provider]  Multiple Vitamin (MULTIVITAMIN WITH MINERALS) TABS tablet Take 1 tablet by mouth daily. 06/17/19  Yes Hosie Poisson, MD  oxycodone (ROXICODONE) 30 MG immediate release tablet Take 2 tablets (60 mg total) by mouth every 3 (three) hours as needed. 06/25/19  Yes Lockamy, Randi L, NP-C  pantoprazole (PROTONIX) 40 MG tablet Take 1 tablet (40 mg total) by mouth 2 (two) times daily. 06/17/19  Yes Hosie Poisson, MD  prochlorperazine (  COMPAZINE) 10 MG tablet Take 1 tablet (10 mg total) by mouth every 6 (six) hours as needed for nausea or vomiting. 05/21/19  Yes Derek Jack, MD  fentaNYL (DURAGESIC) 100 MCG/HR Place 3 patches onto the skin every 3 (three) days. Patient not taking: Reported on 07/10/2019 06/18/19   Derek Jack, MD  Hydrocortisone (GERHARDT'S BUTT CREAM) CREA Apply 1 application topically every 4 (four) hours. Patient not taking: Reported on 07/10/2019 06/16/19   Hosie Poisson, MD  liver oil-zinc oxide (DESITIN) 40 % ointment Apply topically every 4 (four) hours.  Patient not taking: Reported on 07/10/2019 06/16/19   Hosie Poisson, MD     Family History  Problem Relation Age of Onset  . Rectal cancer Neg Hx     Social History   Socioeconomic History  . Marital status: Divorced    Spouse name: Not on file  . Number of children: Not on file  . Years of education: Not on file  . Highest education level: Not on file  Occupational History  . Not on file  Social Needs  . Financial resource strain: Not on file  . Food insecurity    Worry: Not on file    Inability: Not on file  . Transportation needs    Medical: Not on file    Non-medical: Not on file  Tobacco Use  . Smoking status: Never Smoker  . Smokeless tobacco: Never Used  Substance and Sexual Activity  . Alcohol use: Yes    Comment: occ.   . Drug use: No  . Sexual activity: Not on file  Lifestyle  . Physical activity    Days per week: Not on file    Minutes per session: Not on file  . Stress: Not on file  Relationships  . Social Herbalist on phone: Not on file    Gets together: Not on file    Attends religious service: Not on file    Active member of club or organization: Not on file    Attends meetings of clubs or organizations: Not on file    Relationship status: Not on file  Other Topics Concern  . Not on file  Social History Narrative  . Not on file     Review of Systems: A 12 point ROS discussed and pertinent positives are indicated in the HPI above.  All other systems are negative.  Review of Systems  Vital Signs: BP 104/62   Pulse (!) 136   Temp 98.8 F (37.1 C) (Oral)   Resp (!) 21   Ht 5\' 10"  (1.778 m)   Wt 70.3 kg   SpO2 97%   BMI 22.24 kg/m   Physical Exam Vitals signs reviewed.  Constitutional:      Appearance: Normal appearance.  HENT:     Head: Normocephalic and atraumatic.  Eyes:     Extraocular Movements: Extraocular movements intact.  Neck:     Musculoskeletal: Normal range of motion.  Cardiovascular:     Rate and  Rhythm: Normal rate and regular rhythm.  Pulmonary:     Effort: Pulmonary effort is normal.  Abdominal:     General: There is no distension.     Palpations: Abdomen is soft.  Musculoskeletal: Normal range of motion.       Back:     Comments: Right buttock drain in place. Only a small amount of feculent material in the bulb and tubing. The drain flushes and aspirates easily.  Skin:    General: Skin  is warm and dry.  Neurological:     General: No focal deficit present.     Mental Status: He is alert and oriented to person, place, and time.  Psychiatric:        Mood and Affect: Mood normal.        Behavior: Behavior normal.        Thought Content: Thought content normal.        Judgment: Judgment normal.     Imaging: Dg Sacrum/coccyx  Result Date: 06/16/2019 CLINICAL DATA:  Intra-abdominal fluid collection with drain in place. History of rectal cancer. EXAM: SACRUM AND COCCYX - 2+ VIEW COMPARISON:  June 08, 2019. FINDINGS: There is again noted pigtail drainage catheter in the presacral space placed through right transgluteal approach. No fracture or dislocation is noted. Postsurgical changes are noted in the pelvis. There is noted focal lucency seen involving the midportion of the sacrum which corresponds to abnormality seen on prior CT scan and may represent either osteomyelitis or metastatic disease. IMPRESSION: Pigtail drainage catheter is noted in the presacral space. Focal lucency is seen involving midportion of the sacrum which corresponds to abnormality seen on prior CT scan and may represent either osteomyelitis or metastatic disease. Electronically Signed   By: Marijo Conception M.D.   On: 06/16/2019 14:08   Dg Abd 1 View  Result Date: 06/16/2019 CLINICAL DATA:  Intra-abdominal fluid collection. EXAM: ABDOMEN - 1 VIEW COMPARISON:  June 08, 2019. FINDINGS: The bowel gas pattern is normal. There is continued presence of percutaneous drainage catheter seen in the pelvis as  described on prior CT scan. IVC filter is noted. Surgical clips are noted in the pelvis. IMPRESSION: Continued presence of percutaneous drainage catheter seen in the pelvis as noted on prior CT scan. No evidence of bowel obstruction or ileus. Electronically Signed   By: Marijo Conception M.D.   On: 06/16/2019 13:13   Ct Abdomen Pelvis W Contrast  Addendum Date: 07/10/2019   ADDENDUM REPORT: 07/10/2019 15:14 ADDENDUM: For clarification the potential fistulous tract extending into the abscess cavity, likely extends from bowel in the area of the enteroenteric anastomosis. Drain study could be performed as clinically warranted. Electronically Signed   By: Zetta Bills M.D.   On: 07/10/2019 15:14   Result Date: 07/10/2019 CLINICAL DATA:  Abdominal infection, post drainage placement, now with decreased output EXAM: CT ABDOMEN AND PELVIS WITH CONTRAST TECHNIQUE: Multidetector CT imaging of the abdomen and pelvis was performed using the standard protocol following bolus administration of intravenous contrast. CONTRAST:  152mL OMNIPAQUE IOHEXOL 300 MG/ML  SOLN COMPARISON:  06/08/2019 FINDINGS: Lower chest: Lung bases are clear. No pleural or pericardial effusion noted. Hepatobiliary: No focal liver abnormality is seen. No gallstones, gallbladder wall thickening, or biliary dilatation. Pancreas: Unremarkable. No pancreatic ductal dilatation or surrounding inflammatory changes. Spleen: Normal in size without focal abnormality. Adrenals/Urinary Tract: Adrenal glands are unremarkable. Kidneys are normal, without renal calculi, focal lesion, or hydronephrosis. Bladder is unremarkable. Stomach/Bowel: Tethering of multiple bowel loops in the pelvis with large mass measuring 10 x 6 cm (image 80, series 4), in the left hemipelvis extending from the left pelvic sidewall into the central pelvis displacing soft tissue planes and fatty density seen in this location on prior imaging studies. This is associated debris filled  abscess shows decrease in size and is crescentic relative to morphology surrounding the mass centered along the left pelvic sidewall with extension into the central pelvis, measuring approximately 9.0 x 3.2 cm previously approximately 9.4  x 5.0 cm when measured in a similar fashion. The left pelvic component of this process tracks posterior to the dominant mass in the pelvis and extends inferiorly along the mass which favors tissue planes from previous pelvic surgery Gas containing tract extends into the left gluteal fold. Gas tracks along the left inferior pubic ramus towards the base of the penis. 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 Fluid and gas along the left hemipelvis along the inferior margin of the dominant mass measures approximately 7.7 x 3.8 cm. This previously measured approximately 7.9 by 4.9 cm. On today's study there is soft tissue that tracks along the previous left gluteal drain tract (image 85, series 4) this measures approximately 3 cm in greatest thickness previously is measuring approximately 1.5 cm in greatest thickness. Gas tracking along the pelvic sidewall and along the posterior surface of the left acetabulum associated with destructive changes in bone along the ischium with gas tracking towards the left hip joint on today's exam. Stool like material extends into this cavity, perhaps from adjacent enteric loops. There is an presumed tract through the tumor in the mid pelvis (image 75, series 4) that measures approximately 8 mm in greatest width and appears to communicate with bowel in this area at the previous site of bowel anastomosis in the pelvis. There are no signs of bowel obstruction. Vascular/Lymphatic: IVC filter in situ. Left iliofemoral DVT similar to previous study. No signs of aneurysm in the abdomen. Post coiling of a right internal iliac Signs of mild nodal enlargement in the retroperitoneum. Fat necrosis and signs of nodal enlargement along the  left external iliac artery largest lymph node measuring 1.2 cm in short axis in this location. Artery pseudoaneurysm. Reproductive: Signs of pelvic exenteration. Pelvic mass and complex fistulous network with signs of osteomyelitis and recurrent tumor and signs of soft tissue infection now extending in the left gluteal region as outlined above. Addition to this would be worsening of gas within tract, likely matured in communication with the urethra. Left gluteal gas and stranding is similar. Other: Ileal conduit urinary diversion exits via right lower quadrant. Colostomy on the left. Musculoskeletal: Bony changes likely related to osteomyelitis involving the sacrum and left ischium. Signs of soft tissue infection with worsening about the left hip. Changes in the left medial posterior upper thigh are similar to previous exam. Excretory imaging extends through the ileal conduit and shows no obvious leakage of contrast into the pelvic collection. IMPRESSION: 1. 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 Drain injection could be helpful to better illustrate the above findings and show presumed fistulous extension into the urethra and left gluteal and left hip regions. 2. Recurrent tumor with mass in the left hemipelvis in central pelvis potentially with extension along prior drain tract. 3. Signs of osteomyelitis involving the sacrum and left ischium. 4. Signs of nodal enlargement along the left external iliac artery period. 5. 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 6. IVC filter in situ. Left iliofemoral DVT. 7. These results were called by telephone at the time of interpretation on 07/10/2019 at 3:04 pm to provider [facility] , who verbally acknowledged these results. Aortic Atherosclerosis (ICD10-I70.0). Electronically  Signed: By: Zetta Bills M.D. On: 07/10/2019 15:05    Labs:  CBC: Recent Labs    06/13/19 0505 06/14/19 1836 06/15/19 0324 06/16/19 1630  07/10/19 1154  WBC 15.2*  --  12.8* 11.5* 22.5*  HGB 9.2* 8.2* 9.5* 10.2* 8.9*  HCT 29.4* 26.2* 30.6* 32.3* 29.9*  PLT 314  --  309 413* 493*    COAGS: Recent Labs    06/14/19 0418 06/15/19 0324 06/16/19 0521 06/17/19 0814  INR 1.3* 1.2 1.1 1.2    BMP: Recent Labs    06/14/19 1844 06/15/19 0324 06/16/19 1630 07/10/19 1154  NA 139 136 134* 132*  K 3.1* 4.1 3.6 4.2  CL 103 104 100 100  CO2 26 26 25  20*  GLUCOSE 133* 112* 120* 121*  BUN 7 5* 8 12  CALCIUM 6.2* 7.9* 7.8* 8.8*  CREATININE 0.55* 0.80 0.93 0.75  GFRNONAA >60 >60 >60 >60  GFRAA >60 >60 >60 >60    LIVER FUNCTION TESTS: Recent Labs    06/09/19 0406 06/13/19 0505 06/15/19 0324 07/10/19 1154  BILITOT 0.7 0.4 0.2* 0.4  AST 18 21 14* 35  ALT 14 13 13  52*  ALKPHOS 105 107 111 260*  PROT 3.8* 4.3* 4.8* 6.1*  ALBUMIN 1.1* 1.2* 1.4* 1.9*    TUMOR MARKERS: No results for input(s): AFPTM, CEA, CA199, CHROMGRNA in the last 8760 hours.  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  Drain upsized on 06/10/2019 from a 12 fr to a 14 fr.  Now with excess drainage from around the tubing again.  Will upsize the drain again today at the request of the patient and his partner.  Risks and benefits discussed with the patient including bleeding, infection, damage to adjacent structures, bowel perforation/fistula connection, and sepsis.  All of the patient's questions were answered, patient is agreeable to proceed. Consent signed and in chart.  Thank you for this interesting consult.  I greatly enjoyed meeting IORI CARACAPPA and look forward to participating in their care.  A copy of this report was sent to the requesting provider on this date.  Electronically Signed: Murrell Redden, PA-C   07/10/2019, 4:40 PM       I spent a total of    25 Minutes in face to face in clinical consultation, greater than 50% of which was counseling/coordinating care for drain upsize.

## 2019-07-10 NOTE — ED Notes (Signed)
Palliative care at bedside.

## 2019-07-10 NOTE — Progress Notes (Signed)
Spoke with Dr. Hilma Favors regarding red mews score and med surg status. Dr. Hilma Favors is aware of red mews score and plan of care to proceed with admission to med-surg. Bed.

## 2019-07-10 NOTE — Progress Notes (Signed)
Home pca transported and locked up at the pharmacy.

## 2019-07-10 NOTE — ED Notes (Signed)
Patient given water

## 2019-07-10 NOTE — ED Notes (Signed)
ED TO INPATIENT HANDOFF REPORT  Name/Age/Gender Jonathan Wright Service 53 y.o. male  Code Status    Code Status Orders  (From admission, onward)         Start     Ordered   07/10/19 1530  Do not attempt resuscitation (DNR)  Continuous    Question Answer Comment  In the event of cardiac or respiratory ARREST Do not call a "code blue"   In the event of cardiac or respiratory ARREST Do not perform Intubation, CPR, defibrillation or ACLS   In the event of cardiac or respiratory ARREST Use medication by any route, position, wound care, and other measures to relive pain and suffering. May use oxygen, suction and manual treatment of airway obstruction as needed for comfort.      07/10/19 1530        Code Status History    Date Active Date Inactive Code Status Order ID Comments User Context   06/07/2019 1857 06/17/2019 2328 Full Code [id number]  Clydell Hakim, MD Inpatient   06/19/2016 2054 06/23/2016 1309 Full Code [id number]  Vianne Bulls, MD ED   Advance Care Planning Activity    Advance Directive Documentation     Most Recent Value  Type of Advance Directive  Healthcare Power of Attorney  Pre-existing out of facility DNR order (yellow form or pink MOST form)  -  "MOST" Form in Place?  -      Home/SNF/Other Home  Chief Complaint wound drains are clogged  Level of Care/Admitting Diagnosis ED Disposition    ED Disposition Condition [place]: [place] [[id number]]  Level of Care: Med-Surg [16]  Covid Evaluation: Asymptomatic Screening Protocol (No Symptoms)  Diagnosis: Cancer related pain [id number]  Admitting Physician: Acquanetta Chain [[id number]]  Attending Physician: Acquanetta Chain [[id number]]  Estimated length of stay: past midnight tomorrow  Certification:: I certify there are rare and unusual circumstances requiring inpatient admission  PT Class (Do Not Modify): Inpatient [101]  PT Acc Code (Do Not Modify): Private [1]        Medical History Past Medical History:  Diagnosis Date  . Anxiety   . Depression   . Rectal cancer ([place]) 12/17/2015    Allergies No Known Allergies  IV Location/Drains/Wounds Patient Lines/Drains/Airways Status   Active Line/Drains/Airways    Name:   Placement date:   Placement time:   Site:   Days:   PICC Double Lumen Right   -    -        PICC Double Lumen Right   -    -        PICC Double Lumen (Ped)   -    -        Closed System Drain 1 Right Buttock Bulb (JP) 14 Fr.   06/10/19    1425    Buttock   30   Ileostomy LLQ   -    -    LLQ      Urostomy Ureterostomy right RLQ   -    -    RLQ      Incision (Closed) 09/18/18 Groin Left   09/18/18    1108     295   Wound / Incision (Open or Dehisced) 06/08/19 Puncture;Incision - Open Buttocks Left Old JP drain site   06/08/19    2000    Buttocks   32          Labs/Imaging Results for orders placed  or performed during the hospital encounter of 07/10/19 (from the past 48 hour(s))  Comprehensive metabolic panel     Status: Abnormal   Collection Time: 07/10/19 11:54 AM  Result Value Ref Range   Sodium 132 (L) 135 - 145 mmol/L   Potassium 4.2 3.5 - 5.1 mmol/L   Chloride 100 98 - 111 mmol/L   CO2 20 (L) 22 - 32 mmol/L   Glucose, Bld 121 (H) 70 - 99 mg/dL   BUN 12 6 - 20 mg/dL   Creatinine, Ser 0.75 0.61 - 1.24 mg/dL   Calcium 8.8 (L) 8.9 - 10.3 mg/dL   Total Protein 6.1 (L) 6.5 - 8.1 g/dL   Albumin 1.9 (L) 3.5 - 5.0 g/dL   AST 35 15 - 41 U/L   ALT 52 (H) 0 - 44 U/L   Alkaline Phosphatase 260 (H) 38 - 126 U/L   Total Bilirubin 0.4 0.3 - 1.2 mg/dL   GFR calc non Af Amer >60 >60 mL/min   GFR calc Af Amer >60 >60 mL/min   Anion gap 12 5 - 15    Comment: Performed at [facility], [place] [address], [place], [place] [address]  Lactic acid, plasma     Status: Abnormal   Collection Time: 07/10/19 11:54 AM  Result Value Ref Range   Lactic Acid, Venous 2.3 (HH) 0.5 - 1.9 mmol/L    Comment: CRITICAL RESULT CALLED  TO, READ BACK BY AND VERIFIED WITH: Charlton Haws. @ 1240 07/10/2019 PERRY, J. Performed at [facility], [place] [address] [place], [place] [address]   CBC with Differential     Status: Abnormal   Collection Time: 07/10/19 11:54 AM  Result Value Ref Range   WBC 22.5 (H) 4.0 - 10.5 K/uL   RBC 3.17 (L) 4.22 - 5.81 MIL/uL   Hemoglobin 8.9 (L) 13.0 - 17.0 g/dL   HCT 29.9 (L) 39.0 - 52.0 %   MCV 94.3 80.0 - 100.0 fL   MCH 28.1 26.0 - 34.0 pg   MCHC 29.8 (L) 30.0 - 36.0 g/dL   RDW 17.4 (H) 11.5 - 15.5 %   Platelets 493 (H) 150 - 400 K/uL   nRBC 0.0 0.0 - 0.2 %   Neutrophils Relative % 81 %   Neutro Abs 18.4 (H) 1.7 - 7.7 K/uL   Lymphocytes Relative 5 %   Lymphs Abs 1.0 0.7 - 4.0 K/uL   Monocytes Relative 11 %   Monocytes Absolute 2.5 (H) 0.1 - 1.0 K/uL   Eosinophils Relative 1 %   Eosinophils Absolute 0.2 0.0 - 0.5 K/uL   Basophils Relative 1 %   Basophils Absolute 0.1 0.0 - 0.1 K/uL   Immature Granulocytes 1 %   Abs Immature Granulocytes 0.30 (H) 0.00 - 0.07 K/uL    Comment: Performed at [facility], [place] [address] [place], [place] [address]  Lactic acid, plasma     Status: Abnormal   Collection Time: 07/10/19  2:40 PM  Result Value Ref Range   Lactic Acid, Venous 2.1 (HH) 0.5 - 1.9 mmol/L    Comment: CRITICAL RESULT CALLED TO, READ BACK BY AND VERIFIED WITH: C.FRANKLIN AT [id number] ON 07/10/19 BY N.THOMPSON Performed at [facility], [place] [address], [place], [place] [address]    Ct Abdomen Pelvis W Contrast  Addendum Date: 07/10/2019   ADDENDUM REPORT: 07/10/2019 15:14 ADDENDUM: For clarification the potential fistulous tract extending into the abscess cavity, likely extends from bowel in the area of  the enteroenteric anastomosis. Drain study could be performed as clinically warranted. Electronically Signed   By: Zetta Bills M.D.   On: 07/10/2019 15:14   Result Date: 07/10/2019 CLINICAL DATA:  Abdominal infection, post  drainage placement, now with decreased output EXAM: CT ABDOMEN AND PELVIS WITH CONTRAST TECHNIQUE: Multidetector CT imaging of the abdomen and pelvis was performed using the standard protocol following bolus administration of intravenous contrast. CONTRAST:  171mL OMNIPAQUE IOHEXOL 300 MG/ML  SOLN COMPARISON:  06/08/2019 FINDINGS: Lower chest: Lung bases are clear. No pleural or pericardial effusion noted. Hepatobiliary: No focal liver abnormality is seen. No gallstones, gallbladder wall thickening, or biliary dilatation. Pancreas: Unremarkable. No pancreatic ductal dilatation or surrounding inflammatory changes. Spleen: Normal in size without focal abnormality. Adrenals/Urinary Tract: Adrenal glands are unremarkable. Kidneys are normal, without renal calculi, focal lesion, or hydronephrosis. Bladder is unremarkable. Stomach/Bowel: Tethering of multiple bowel loops in the pelvis with large mass measuring 10 x 6 cm (image 80, series 4), in the left hemipelvis extending from the left pelvic sidewall into the central pelvis displacing soft tissue planes and fatty density seen in this location on prior imaging studies. This is associated debris filled abscess shows decrease in size and is crescentic relative to morphology surrounding the mass centered along the left pelvic sidewall with extension into the central pelvis, measuring approximately 9.0 x 3.2 cm previously approximately 9.4 x 5.0 cm when measured in a similar fashion. The left pelvic component of this process tracks posterior to the dominant mass in the pelvis and extends inferiorly along the mass which favors tissue planes from previous pelvic surgery Gas containing tract extends into the left gluteal fold. Gas tracks along the left inferior pubic ramus towards the base of the penis. 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 Fluid and gas along the left hemipelvis along the inferior margin of the dominant mass measures approximately  7.7 x 3.8 cm. This previously measured approximately 7.9 by 4.9 cm. On today's study there is soft tissue that tracks along the previous left gluteal drain tract (image 85, series 4) this measures approximately 3 cm in greatest thickness previously is measuring approximately 1.5 cm in greatest thickness. Gas tracking along the pelvic sidewall and along the posterior surface of the left acetabulum associated with destructive changes in bone along the ischium with gas tracking towards the left hip joint on today's exam. Stool like material extends into this cavity, perhaps from adjacent enteric loops. There is an presumed tract through the tumor in the mid pelvis (image 75, series 4) that measures approximately 8 mm in greatest width and appears to communicate with bowel in this area at the previous site of bowel anastomosis in the pelvis. There are no signs of bowel obstruction. Vascular/Lymphatic: IVC filter in situ. Left iliofemoral DVT similar to previous study. No signs of aneurysm in the abdomen. Post coiling of a right internal iliac Signs of mild nodal enlargement in the retroperitoneum. Fat necrosis and signs of nodal enlargement along the left external iliac artery largest lymph node measuring 1.2 cm in short axis in this location. Artery pseudoaneurysm. Reproductive: Signs of pelvic exenteration. Pelvic mass and complex fistulous network with signs of osteomyelitis and recurrent tumor and signs of soft tissue infection now extending in the left gluteal region as outlined above. Addition to this would be worsening of gas within tract, likely matured in communication with the urethra. Left gluteal gas and stranding is similar. Other: Ileal conduit urinary diversion exits via right  lower quadrant. Colostomy on the left. Musculoskeletal: Bony changes likely related to osteomyelitis involving the sacrum and left ischium. Signs of soft tissue infection with worsening about the left hip. Changes in the left medial  posterior upper thigh are similar to previous exam. Excretory imaging extends through the ileal conduit and shows no obvious leakage of contrast into the pelvic collection. IMPRESSION: 1. 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 Drain injection could be helpful to better illustrate the above findings and show presumed fistulous extension into the urethra and left gluteal and left hip regions. 2. Recurrent tumor with mass in the left hemipelvis in central pelvis potentially with extension along prior drain tract. 3. Signs of osteomyelitis involving the sacrum and left ischium. 4. Signs of nodal enlargement along the left external iliac artery period. 5. 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 6. IVC filter in situ. Left iliofemoral DVT. 7. These results were called by telephone at the time of interpretation on 07/10/2019 at 3:04 pm to provider [facility] , who verbally acknowledged these results. Aortic Atherosclerosis (ICD10-I70.0). Electronically Signed: By: Zetta Bills M.D. On: 07/10/2019 15:05    Pending Labs Unresulted Labs (From admission, onward)    Start     Ordered   07/17/19 0500  Creatinine, serum  (enoxaparin (LOVENOX)    CrCl >/= 30 ml/min)  Weekly,   R    Comments: while on enoxaparin therapy    07/10/19 1530   07/10/19 1528  CBC  (enoxaparin (LOVENOX)    CrCl >/= 30 ml/min)  Once,   STAT    Comments: Baseline for enoxaparin therapy IF NOT ALREADY DRAWN.  Notify MD if PLT < 100 K.    07/10/19 1530   07/10/19 1130  SARS CORONAVIRUS 2 (TAT 6-24 HRS) Nasopharyngeal Nasopharyngeal Swab  (Asymptomatic/Tier 3)  Once,   STAT    Question Answer Comment  Is this test for diagnosis or screening Screening   Symptomatic for COVID-19 as defined by CDC No   Hospitalized for COVID-19 No   Admitted  to ICU for COVID-19 No   Previously tested for COVID-19 Yes   Resident in a congregate (group) care setting No   Employed in healthcare setting No      07/10/19 1130          Vitals/Pain Today's Vitals   07/10/19 1433 07/10/19 1438 07/10/19 1500 07/10/19 1530  BP:  93/64 104/62   Pulse: (!) 125 (!) 133 (!) 143 (!) 136  Resp: 17 15 (!) 21   Temp:      TempSrc:      SpO2: 100% 98% 99% 97%  Weight:      Height:      PainSc:        Isolation Precautions No active isolations  Medications Medications  sodium chloride (PF) 0.9 % injection (has no administration in time range)  oxyCODONE (Oxy IR/ROXICODONE) immediate release tablet 20 mg (has no administration in time range)  LORazepam (ATIVAN) injection 2 mg (has no administration in time range)  enoxaparin (LOVENOX) injection 40 mg (has no administration in time range)  naloxone (NARCAN) injection 0.4 mg (has no administration in time range)    And  sodium chloride flush (NS) 0.9 % injection 9 mL (has no administration in time range)  ondansetron (ZOFRAN) injection 4 mg (has no administration in time range)  diphenhydrAMINE (BENADRYL) injection 12.5 mg (has no administration in time range)    Or  diphenhydrAMINE (BENADRYL) 12.5 MG/5ML elixir 12.5 mg (has no administration in time range)  sodium chloride 0.9 % bolus 2,000 mL (2,000 mLs Intravenous New Bag/Given (Non-Interop) 07/10/19 1155)  iohexol (OMNIPAQUE) 300 MG/ML solution 100 mL (100 mLs Intravenous Contrast Given 07/10/19 1406)    Mobility walks with person assist

## 2019-07-10 NOTE — Progress Notes (Signed)
Jonathan Wright is 53 yo with metastatic rectal cancer, progressive disease since 2017, multiple rounds of chemotherapy and complications related to his cancer. He is s/p discharge from acute hospitalization with PE, sepsis and complications related to metstatic disease involving his sacrum and pelvis. 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  Multiple phone calls post hospitalization for worsening and difficult to control pain. I placed him on a home infusion of hydromorphone on 07/02/19 and despite increasing doses have been unsuccessful achieving relief. This level of pain is much different than when hie was in the hospital so I discussed the possibility of opioid induced hyperalgesia vs. Cancer Progression as underlying cause. Jonathan Wright and [name] have noticed that several of his drains are no longer having output. After receiving this information I have growing concern that he is having pain due to building pressure in his pelvis from abscess growth and expansion. 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 He is not currently on hospice care because he wants to pursue any and all treatment options that are possible or offered.   Given the fact that he is not on hospice I explained to them that I did not feel comfortable continuing to manage his pain at home without a monitored setting and complete evaluation. I gave them two options:  1. Elect hospice care and avoid return to the hospital, pain control could continue in the home, likely would not evaluate drains. I discussed his goals and prognosis. They are more accepting of this path than before but want the drains checked.  2. [place] is unable to be transported without an ambulance due to severe pain and debility. If they want to push on I told them they would need to come to the hospital for evaluation and possible  admission. Would be difficult to arrange for an urgent IR outpatient evaluation and he likely needs CT imaging of his Pelvis to determine extent of his disease. His opioid requirements are also difficult for in home management without hospice.  I believe they will pursue hospital care at least one more time and then enroll in hospice services.   Our palliative team is available for ED consultation if needed. I do see value in obtaining additional imaging and having his drains adjusted especially if this will help relieve some of the pelvic pressure and pain he is having or if it provided prognostic information on cancer progression.  Lane Hacker, DO Palliative Medicine

## 2019-07-11 ENCOUNTER — Other Ambulatory Visit (HOSPITAL_COMMUNITY): Payer: Self-pay | Admitting: Hematology

## 2019-07-11 ENCOUNTER — Encounter (HOSPITAL_COMMUNITY): Payer: Self-pay | Admitting: Internal Medicine

## 2019-07-11 DIAGNOSIS — C20 Malignant neoplasm of rectum: Secondary | ICD-10-CM

## 2019-07-11 DIAGNOSIS — G893 Neoplasm related pain (acute) (chronic): Secondary | ICD-10-CM | POA: Diagnosis not present

## 2019-07-11 MED ORDER — FENTANYL 100 MCG/HR TD PT72
3.0000 | MEDICATED_PATCH | TRANSDERMAL | Status: DC
  Administered 2019-07-11: 3 via TRANSDERMAL
  Filled 2019-07-11: qty 2
  Filled 2019-07-11: qty 1

## 2019-07-11 MED ORDER — FENTANYL 100 MCG/HR TD PT72: 1.0000 | MEDICATED_PATCH | TRANSDERMAL | Status: DC

## 2019-07-11 MED ORDER — HEPARIN SOD (PORK) LOCK FLUSH 100 UNIT/ML IV SOLN
250.0000 [IU] | INTRAVENOUS | Status: AC | PRN
  Administered 2019-07-11: 11:00:00 250 [IU]
  Filled 2019-07-11: qty 2.5

## 2019-07-11 MED ORDER — HYDROMORPHONE 2 MG/ML HIGH CONCENTRATION IV PCA SOLN: INTRAVENOUS | 0 refills | Status: AC

## 2019-07-11 NOTE — H&P (Signed)
[facility] History and Physical  Jonathan RUPANI [id number] DOB: 07-07-1966 DOA: 07/10/2019 PCP: Jonathan Pretty, MD   Chief Complaint: Pain  HPI: Jonathan Wright is a 53 y.o. male with advanced metastatic rectal cancer. Followed at home by palliative care requiring increased doses of PCA hydromorphone. Concern for drain malfunction contributing to increased pain. Admitted for evaluation of drain and for pain medication titration.   Review of Systems:  Constitutional:  No weight loss, night sweats, Fevers, chills, fatigue.  HEENT:  No headaches, Difficulty swallowing,Tooth/dental problems,Sore throat,  No sneezing, itching, ear ache, nasal congestion, post nasal drip,  Cardio-vascular:  No chest pain, Orthopnea, PND, swelling in lower extremities, anasarca, dizziness, palpitations  GI:  No heartburn, indigestion, abdominal pain, nausea, vomiting, diarrhea, change in bowel habits, loss of appetite  Resp:  No shortness of breath with exertion or at rest. No excess mucus, no productive cough, No non-productive cough, No coughing up of blood.No change in color of mucus.No wheezing.No chest wall deformity  Skin:  no rash or lesions.  GU:  no dysuria, change in color of urine, no urgency or frequency. No flank pain.  Musculoskeletal:  No joint pain or swelling. No decreased range of motion. No back pain.  Psych:  No change in mood or affect. No depression or anxiety. No memory loss.   Past Medical History:  Diagnosis Date   Anxiety    Depression    Rectal cancer ([place]) 12/17/2015   Past Surgical History:  Procedure Laterality Date   ABDOMINAL PERINEAL BOWEL RESECTION  06/07/2016   [facility]   COLON SURGERY     DIVERTING ILEOSTOMY  06/07/2016   [facility]   INGUINAL LYMPH NODE BIOPSY Left 09/18/2018   Procedure: LEFT INGUINAL LYMPH NODE BIOPSY;  Surgeon: Aviva Signs, MD;  Location: [facility];  Service: General;  Laterality: Left;   IR ANGIOGRAM PELVIS  SELECTIVE OR SUPRASELECTIVE  06/08/2019   IR ANGIOGRAM SELECTIVE EACH ADDITIONAL VESSEL  06/08/2019   IR ANGIOGRAM SELECTIVE EACH ADDITIONAL VESSEL  06/08/2019   IR CATHETER TUBE CHANGE  06/10/2019   IR CATHETER TUBE CHANGE  07/10/2019   IR EMBO ART  VEN HEMORR LYMPH EXTRAV  INC GUIDE ROADMAPPING  06/08/2019   IR IVC FILTER PLMT / S&I /IMG GUID/MOD SED  06/08/2019   IR RADIOLOGIST EVAL & MGMT  12/25/2018   IR US GUIDE VASC ACCESS LEFT  06/08/2019   LAPAROSCOPIC LOW ANTERIOR RESCECTION WITH COLOANAL ANASTOMOSIS  06/07/2016   [facility]   PERIPHERALLY INSERTED CENTRAL CATHETER INSERTION  11/2015   Social History:  reports that he has never smoked. He has never used smokeless tobacco. He reports current alcohol use. He reports that he does not use drugs.  No Known Allergies  Family History  Problem Relation Age of Onset   Rectal cancer Neg Hx     Prior to Admission medications   Medication Sig Start Date End Date Taking? Authorizing Provider  ALPRAZolam Duanne Moron) 1 MG tablet Take 0.5 tablets (0.5 mg total) by mouth 2 (two) times daily as needed for anxiety. 06/04/19  Yes Derek Jack, MD  feeding supplement, ENSURE ENLIVE, (ENSURE ENLIVE) LIQD Take 237 mLs by mouth 2 (two) times daily between meals. 06/16/19  Yes Hosie Poisson, MD  fentaNYL (DURAGESIC) 100 MCG/HR Place 3 patches onto the skin every other day. 06/25/19  Yes Lockamy, Randi L, NP-C  FLUoxetine (PROZAC) 40 MG capsule TAKE 1 CAPSULE(40 MG) BY MOUTH DAILY Patient taking differently: Take 40 mg by  mouth daily.  04/21/19  Yes Derek Jack, MD  HYDROmorphone PCA 2 mg/mL (DILAUDID) 2 mg/mL SOLN Inject 2 mg into the vein every 4 (four) hours. 2mg  basal rate, 2mg  PCA dose q15 prn pain   Yes [provider]  Multiple Vitamin (MULTIVITAMIN WITH MINERALS) TABS tablet Take 1 tablet by mouth daily. 06/17/19  Yes Hosie Poisson, MD  oxycodone (ROXICODONE) 30 MG immediate release tablet Take 2 tablets (60 mg  total) by mouth every 3 (three) hours as needed. 06/25/19  Yes Lockamy, Randi L, NP-C  pantoprazole (PROTONIX) 40 MG tablet Take 1 tablet (40 mg total) by mouth 2 (two) times daily. 06/17/19  Yes Hosie Poisson, MD  prochlorperazine (COMPAZINE) 10 MG tablet Take 1 tablet (10 mg total) by mouth every 6 (six) hours as needed for nausea or vomiting. 05/21/19  Yes Derek Jack, MD  fentaNYL (DURAGESIC) 100 MCG/HR Place 3 patches onto the skin every 3 (three) days. Patient not taking: Reported on 07/10/2019 06/18/19   Derek Jack, MD  Hydrocortisone (GERHARDT'S BUTT CREAM) CREA Apply 1 application topically every 4 (four) hours. Patient not taking: Reported on 07/10/2019 06/16/19   Hosie Poisson, MD  liver oil-zinc oxide (DESITIN) 40 % ointment Apply topically every 4 (four) hours. Patient not taking: Reported on 07/10/2019 06/16/19   Hosie Poisson, MD   Physical Exam: Vitals:   07/11/19 0101 07/11/19 0229 07/11/19 0431 07/11/19 0517  BP:  (!) 88/66  98/62  Pulse:  (!) 123  (!) 118  Resp: 18 18 18 18   Temp:  (!) 100.5 F (38.1 C) 99.6 F (37.6 C) 100.2 F (37.9 C)  TempSrc:  Oral Oral Oral  SpO2: 100% 98% 98% 98%  Weight:      Height:        Wt Readings from Last 3 Encounters:  07/10/19 70.3 kg  06/15/19 70.8 kg  05/13/19 74 kg    General: Slightly agitated and uncomfortable appearing Eyes: PERRL, normal lids, irises & conjunctiva ENT: grossly normal hearing, lips & tongue Neck: no LAD, masses or thyromegaly Cardiovascular: RRR, no m/r/g. No LE edema. Telemetry: SR, no arrhythmias  Respiratory: CTA bilaterally, no w/r/r. Normal respiratory effort. Abdomen: tight painful to palpation Skin: Drains at left buttock and drgs in place, heavy foul smelling and purulent drainage Musculoskeletal: grossly normal tone BUE/BLE Psychiatric: grossly normal mood and affect, speech fluent and appropriate Neurologic: grossly non-focal.          Labs on Admission:  Basic  Metabolic Panel: Recent Labs  Lab 07/10/19 1154  NA 132*  K 4.2  CL 100  CO2 20*  GLUCOSE 121*  BUN 12  CREATININE 0.75  CALCIUM 8.8*   Liver Function Tests: Recent Labs  Lab 07/10/19 1154  AST 35  ALT 52*  ALKPHOS 260*  BILITOT 0.4  PROT 6.1*  ALBUMIN 1.9*   No results for input(s): LIPASE, AMYLASE in the last 168 hours. No results for input(s): AMMONIA in the last 168 hours. CBC: Recent Labs  Lab 07/10/19 1154  WBC 22.5*  NEUTROABS 18.4*  HGB 8.9*  HCT 29.9*  MCV 94.3  PLT 493*   Cardiac Enzymes: No results for input(s): CKTOTAL, CKMB, CKMBINDEX, TROPONINI in the last 168 hours.  BNP (last 3 results) No results for input(s): BNP in the last 8760 hours.  ProBNP (last 3 results) No results for input(s): PROBNP in the last 8760 hours.  CBG: No results for input(s): GLUCAP in the last 168 hours.  Radiological Exams on Admission: Ct Abdomen  Pelvis W Contrast  Addendum Date: 07/10/2019   ADDENDUM REPORT: 07/10/2019 15:14 ADDENDUM: For clarification the potential fistulous tract extending into the abscess cavity, likely extends from bowel in the area of the enteroenteric anastomosis. Drain study could be performed as clinically warranted. Electronically Signed   By: Zetta Bills M.D.   On: 07/10/2019 15:14   Result Date: 07/10/2019 CLINICAL DATA:  Abdominal infection, post drainage placement, now with decreased output EXAM: CT ABDOMEN AND PELVIS WITH CONTRAST TECHNIQUE: Multidetector CT imaging of the abdomen and pelvis was performed using the standard protocol following bolus administration of intravenous contrast. CONTRAST:  139mL OMNIPAQUE IOHEXOL 300 MG/ML  SOLN COMPARISON:  06/08/2019 FINDINGS: Lower chest: Lung bases are clear. No pleural or pericardial effusion noted. Hepatobiliary: No focal liver abnormality is seen. No gallstones, gallbladder wall thickening, or biliary dilatation. Pancreas: Unremarkable. No pancreatic ductal dilatation or surrounding  inflammatory changes. Spleen: Normal in size without focal abnormality. Adrenals/Urinary Tract: Adrenal glands are unremarkable. Kidneys are normal, without renal calculi, focal lesion, or hydronephrosis. Bladder is unremarkable. Stomach/Bowel: Tethering of multiple bowel loops in the pelvis with large mass measuring 10 x 6 cm (image 80, series 4), in the left hemipelvis extending from the left pelvic sidewall into the central pelvis displacing soft tissue planes and fatty density seen in this location on prior imaging studies. This is associated debris filled abscess shows decrease in size and is crescentic relative to morphology surrounding the mass centered along the left pelvic sidewall with extension into the central pelvis, measuring approximately 9.0 x 3.2 cm previously approximately 9.4 x 5.0 cm when measured in a similar fashion. The left pelvic component of this process tracks posterior to the dominant mass in the pelvis and extends inferiorly along the mass which favors tissue planes from previous pelvic surgery Gas containing tract extends into the left gluteal fold. Gas tracks along the left inferior pubic ramus towards the base of the penis. 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 Fluid and gas along the left hemipelvis along the inferior margin of the dominant mass measures approximately 7.7 x 3.8 cm. This previously measured approximately 7.9 by 4.9 cm. On today's study there is soft tissue that tracks along the previous left gluteal drain tract (image 85, series 4) this measures approximately 3 cm in greatest thickness previously is measuring approximately 1.5 cm in greatest thickness. Gas tracking along the pelvic sidewall and along the posterior surface of the left acetabulum associated with destructive changes in bone along the ischium with gas tracking towards the left hip joint on today's exam. Stool like material extends into this cavity, perhaps from adjacent  enteric loops. There is an presumed tract through the tumor in the mid pelvis (image 75, series 4) that measures approximately 8 mm in greatest width and appears to communicate with bowel in this area at the previous site of bowel anastomosis in the pelvis. There are no signs of bowel obstruction. Vascular/Lymphatic: IVC filter in situ. Left iliofemoral DVT similar to previous study. No signs of aneurysm in the abdomen. Post coiling of a right internal iliac Signs of mild nodal enlargement in the retroperitoneum. Fat necrosis and signs of nodal enlargement along the left external iliac artery largest lymph node measuring 1.2 cm in short axis in this location. Artery pseudoaneurysm. Reproductive: Signs of pelvic exenteration. Pelvic mass and complex fistulous network with signs of osteomyelitis and recurrent tumor and signs of soft tissue infection now extending in the left gluteal region as outlined  above. Addition to this would be worsening of gas within tract, likely matured in communication with the urethra. Left gluteal gas and stranding is similar. Other: Ileal conduit urinary diversion exits via right lower quadrant. Colostomy on the left. Musculoskeletal: Bony changes likely related to osteomyelitis involving the sacrum and left ischium. Signs of soft tissue infection with worsening about the left hip. Changes in the left medial posterior upper thigh are similar to previous exam. Excretory imaging extends through the ileal conduit and shows no obvious leakage of contrast into the pelvic collection. IMPRESSION: 1. 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 Drain injection could be helpful to better illustrate the above findings and show presumed fistulous extension into the urethra and left gluteal and left hip regions. 2. Recurrent tumor with mass in the  left hemipelvis in central pelvis potentially with extension along prior drain tract. 3. Signs of osteomyelitis involving the sacrum and left ischium. 4. Signs of nodal enlargement along the left external iliac artery period. 5. 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 6. IVC filter in situ. Left iliofemoral DVT. 7. These results were called by telephone at the time of interpretation on 07/10/2019 at 3:04 pm to provider [facility] , who verbally acknowledged these results. Aortic Atherosclerosis (ICD10-I70.0). Electronically Signed: By: Zetta Bills M.D. 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 He presents with a clogged percutaneous drainage catheter. EXAM: Drainage catheter exchange and up size MEDICATIONS: None ANESTHESIA/SEDATION: Fentanyl 100 mcg IV; Versed 2 mg IV Moderate Sedation Time:  27 minutes The patient was continuously monitored during the procedure by the interventional radiology nurse under my direct supervision. COMPLICATIONS: None immediate. PROCEDURE: Informed written consent was obtained from the patient after a thorough discussion of the procedural risks, benefits and alternatives. All questions were addressed. Maximal Sterile Barrier Technique was utilized including caps, mask, sterile gowns, sterile gloves, sterile drape, hand hygiene and skin antiseptic. A timeout was performed prior to the initiation of the procedure. A hand injection of contrast material was performed through the existing catheter. The catheter is partially clogged. However, injection reveals a large presacral abscess. Additionally, there is a clear fistulous communication with the adjacent bowel. The catheter was cut and removed over a Bentson wire. A  20 [place] Thal catheter was advanced over the wire and formed in the collection. Copious flushing was performed yielding a significant volume of foul-smelling feculent material and debris. The catheter was connected to gravity bag drainage and secured to the skin with 0 Prolene suture. IMPRESSION: 1. Clogged 14 French percutaneous drainage catheter. 2. Large presacral fluid collection with clear fistulous communication with the adjacent bowel. 3. Successful exchange and up size for a 20 [place] Thal drainage catheter with evacuation of a large volume of feculent fluid. Electronically Signed   By: Jacqulynn Cadet M.D.   On: 07/10/2019 17:54    EKG: Independently reviewed.   Assessment/Plan Active Problems:   Cancer related pain   1. Advanced Rectal Cancer, intractable pain 1. Admitted for Drain evaluation 2. Consult placed for IR 3. Orders placed for hydromorphone infusion titration, basal set at 4mg /hr with q50min bolus of 2mg .    Code Status: Detailed Discussion.  DNR DVT Prophylaxis: Lovenox Family Communication: Discussed with patient and his partner [name] Disposition Plan: Home in AM with hospice Time spent: 70 minutes  [facility] [place] Pager [phone]

## 2019-07-11 NOTE — Progress Notes (Signed)
Patient ready for d/c this am. Will need referral sent to [facility]. PCA script signed and on chart DNR on chart. Will need ambulance transport home.  Lane Hacker, DO Palliative Medicine

## 2019-07-11 NOTE — Progress Notes (Signed)
Palliative Medicine RN Note: Rec'd call from [facility]. They are going to meet pt at his home at 1400 today. [facility] is at [facility] now to pick up.  Marjie Skiff Aundrey Elahi, RN, BSN, [facility] Palliative Medicine Team 07/11/2019 10:43 AM Office [phone]

## 2019-07-11 NOTE — Progress Notes (Signed)
Patient was transferred to 5 E with red MEWS score. Rapid response nurse called MD Hilma Favors to notify and MD is aware pf red MEWs score. Vital signs was checked as red MEWs score protocol. Paged Floor Coverage MD after getting report from day shift to confirm about red MEWs score as well. Patient is alert and oriented x 4. On PCA dilaudid as MD ordered for palliative.

## 2019-07-11 NOTE — TOC Transition Note (Signed)
Transition of Care [facility]) - CM/SW Discharge Note   Patient Details  Name: Jonathan Wright MRN: [id number] Date of Birth: [birth year]  Transition of Care [facility]) CM/SW Contact:  Trish Mage, LCSW Phone Number: 07/11/2019, 9:28 AM   Clinical Narrative:  Referred patient to [facility] per Palliative request.  Set up transportation home via PT[facility]. TOC sign off.    Final next level of care: Home w Hospice Care Barriers to Discharge: No Barriers Identified   Patient Goals and CMS Choice        Discharge Placement                       Discharge Plan and Services                                     Social Determinants of Health (SDOH) Interventions     Readmission Risk Interventions No flowsheet data found.

## 2019-07-11 NOTE — Discharge Summary (Signed)
Physician Discharge Summary  Patient ID: Jonathan Wright MRN: [id number] DOB/AGE: 1965-09-07 53 y.o.  Admit date: 07/10/2019 Discharge date: 07/11/2019  Admission Diagnoses: Patient Active Problem List   Diagnosis Date Noted  . Cancer related pain 07/10/2019  . Pseudoaneurysm ([place])   . Other pulmonary embolism without acute cor pulmonale ([place])   . Respiratory arrest ([place]) 06/07/2019  . Goals of care, counseling/discussion 10/11/2018  . Severe anemia 06/19/2016  . Hyponatremia 06/19/2016  . Depression with anxiety 06/19/2016  . Rectal cancer ([place]) 12/17/2015     Discharge Diagnoses:  Active Problems:   Cancer related pain   Discharged Condition: stable  Hospital Course: Admitted from ED for pain control and to have IR evaluate his drains. CT scan showed enlarging fluid collections and cancer progression. Hydromorphone infusion increased to basal of 4mg  and bolus of 4mg , tolerated well. IR upsized his drain successfully. Plan is to discharge home with hospice care. Extensive discussion on the value of hospice at this point in his illness, both the patient and [name] agree with this care plan.  Consults: Interventional Radiology  Significant Diagnostic Studies:  CT scan of abdomen and pelvis consistent with progressive metastatic disease.  Treatments:  Titration of hydromorphone infusion for cancer related pain.  Discharge Exam: Blood pressure 98/62, pulse (!) 118, temperature 100.2 F (37.9 C), temperature source Oral, resp. rate 18, height 5\' 10"  (1.778 m), weight 70.3 kg, SpO2 98 %. AOX3, appears to be in less pain. Lungs are clear. Abdomen is soft, ostomy appears normal, lower abdominal tenderness, bulb drains are functioning, dressings in place.  Disposition:   Discharge Instructions    Diet general   Complete by: As directed    Discharge instructions   Complete by: As directed    Referral being [facility] to [facility]   Increase activity slowly    Complete by: As directed      Allergies as of 07/11/2019   No Known Allergies     Medication List    TAKE these medications   ALPRAZolam 1 MG tablet Commonly known as: Xanax Take 0.5 tablets (0.5 mg total) by mouth 2 (two) times daily as needed for anxiety.   feeding supplement (ENSURE ENLIVE) Liqd Take 237 mLs by mouth 2 (two) times daily between meals.   fentaNYL 100 MCG/HR Commonly known as: [place] 3 patches onto the skin every 3 (three) days.   fentaNYL 100 MCG/HR Commonly known as: [place] 3 patches onto the skin every other day.   FLUoxetine 40 MG capsule Commonly known as: PROZAC TAKE 1 CAPSULE(40 MG) BY MOUTH DAILY What changed: See the new instructions.   Gerhardt's butt cream Crea Apply 1 application topically every 4 (four) hours.   HYDROmorphone PCA 2 mg/mL 2 mg/mL Soln Commonly known as: DILAUDID Basal Infusion 4mg /hr PCA Bolus Dose 4mg  every 15 min prn pain max 1 hour 16mg  7 day dose: 2688mg  (bolus doses) + 672mg  (basal) 3360mg  total Infusion pharmacist to determine concentration and volume. What changed:   how much to take  how to take this  when to take this  additional instructions   liver oil-zinc oxide 40 % ointment Commonly known as: DESITIN Apply topically every 4 (four) hours.   multivitamin with minerals Tabs tablet Take 1 tablet by mouth daily.   oxycodone 30 MG immediate release tablet Commonly known as: ROXICODONE Take 2 tablets (60 mg total) by mouth every 3 (three) hours as needed.   pantoprazole 40 MG tablet Commonly known as:  PROTONIX Take 1 tablet (40 mg total) by mouth 2 (two) times daily.   prochlorperazine 10 MG tablet Commonly known as: COMPAZINE Take 1 tablet (10 mg total) by mouth every 6 (six) hours as needed for nausea or vomiting.      Follow-up Information    [facility]   Why: This is the agency that will be working with you in the home Contact  information: [place] [address] [phone]           Signed: Lane Hacker 07/11/2019, 7:48 PM

## 2019-07-11 NOTE — Progress Notes (Signed)
Palliative Medicine RN Note: Per request of Dr Hilma Favors, [facility] referral in to [place] at [facility]. They do not have access to all of Epic, so I will fax records to [place] at [phone]. Let them know he is being d/c today w PCA and high dose fentanyl patches and will need admission asap.  Marjie Skiff Basma Buchner, RN, BSN, [facility] Palliative Medicine Team 07/11/2019 9:37 AM Office [phone]

## 2019-07-11 NOTE — Progress Notes (Signed)
Pt is discharged home with hospice. Discharge instructions including follow-ups and medications provided. Pt supplied Dilaudid returned to pt. Pt has no further questions at this time.

## 2019-07-15 ENCOUNTER — Other Ambulatory Visit: Payer: Self-pay | Admitting: Internal Medicine

## 2019-07-15 DIAGNOSIS — Z515 Encounter for palliative care: Secondary | ICD-10-CM

## 2019-07-15 DIAGNOSIS — C20 Malignant neoplasm of rectum: Secondary | ICD-10-CM

## 2019-07-15 MED ORDER — FENTANYL 100 MCG/HR TD PT72: 3.0000 | MEDICATED_PATCH | TRANSDERMAL | 0 refills | Status: DC

## 2019-07-15 MED ORDER — OXYCODONE HCL 30 MG PO TABS: 60.0000 mg | ORAL_TABLET | ORAL | 0 refills | Status: DC | PRN

## 2019-07-22 ENCOUNTER — Other Ambulatory Visit (HOSPITAL_COMMUNITY): Payer: Self-pay | Admitting: *Deleted

## 2019-07-22 DIAGNOSIS — K6289 Other specified diseases of anus and rectum: Secondary | ICD-10-CM

## 2019-07-23 MED ORDER — SULFAMETHOXAZOLE-TRIMETHOPRIM 800-160 MG PO TABS: 1.0000 | ORAL_TABLET | Freq: Two times a day (BID) | ORAL | 0 refills | Status: AC

## 2019-07-23 MED ORDER — ALPRAZOLAM 1 MG PO TABS: 0.5000 mg | ORAL_TABLET | Freq: Two times a day (BID) | ORAL | 0 refills | Status: AC | PRN

## 2019-07-24 ENCOUNTER — Other Ambulatory Visit (HOSPITAL_COMMUNITY): Payer: Self-pay | Admitting: *Deleted

## 2019-07-24 MED ORDER — PANTOPRAZOLE SODIUM 40 MG PO TBEC: 40.0000 mg | DELAYED_RELEASE_TABLET | Freq: Two times a day (BID) | ORAL | 0 refills | Status: AC

## 2019-07-27 ENCOUNTER — Telehealth: Payer: Self-pay | Admitting: Hospice and Palliative Medicine

## 2019-07-27 DIAGNOSIS — Z515 Encounter for palliative care: Secondary | ICD-10-CM

## 2019-07-27 DIAGNOSIS — C20 Malignant neoplasm of rectum: Secondary | ICD-10-CM

## 2019-07-27 DIAGNOSIS — G893 Neoplasm related pain (acute) (chronic): Secondary | ICD-10-CM

## 2019-07-27 MED ORDER — FENTANYL 100 MCG/HR TD PT72: 3.0000 | MEDICATED_PATCH | TRANSDERMAL | 0 refills | Status: AC

## 2019-07-27 MED ORDER — OXYCODONE HCL 30 MG PO TABS: 60.0000 mg | ORAL_TABLET | ORAL | 0 refills | Status: AC | PRN

## 2019-07-27 NOTE — Telephone Encounter (Signed)
Call received from significant other, [name], requesting refill of fentanyl patches and oxycodone.    I recently saw [facility] during last hospital admission.  Have been working on controlling his pain in conjunction with Dr. Hilma Favors and Dr. Delton Coombes.  This remains very complex pain management situation.  Refill sent.  Continue to recommend working to get [place] hospice support.  Micheline Rough, MD [place] Team [phone]

## 2019-08-12 ENCOUNTER — Other Ambulatory Visit (HOSPITAL_COMMUNITY): Payer: Self-pay | Admitting: Hematology

## 2019-08-12 DIAGNOSIS — F32A Depression, unspecified: Secondary | ICD-10-CM

## 2019-08-12 DIAGNOSIS — F329 Major depressive disorder, single episode, unspecified: Secondary | ICD-10-CM

## 2019-08-22 DEATH — deceased

## 2020-10-18 IMAGING — US IR EMBO ART  VEN HEMORR LYMPH EXTRAV  INC GUIDE ROADMAPPING
1 series · 5 of 5 positions shown · non-contrast
Comparison: none

INDICATION: 53-year-old male with a history of rectal cancer and chronic pelvic
abscess/necrotic fluid collection with a drain. Patient presented to
the hospital with tachycardia and altered mental status. Hemoglobin
was 2.7. Patient was found to have small pulmonary emboli and the
left lower extremity DVT. CT also demonstrated a pseudoaneurysm in
the right pelvis adjacent to the large complex pelvic collection.
Patient has been on pressors and requires treatment of the pelvic
pseudoaneurysm. Patient also needs an IVC filter because he is not a
candidate for anticoagulation at this time.

[Series 1: ir embo art ven hemorr lymph extrav inc guide road · 5 of 5 slices shown]
[im 1/5]
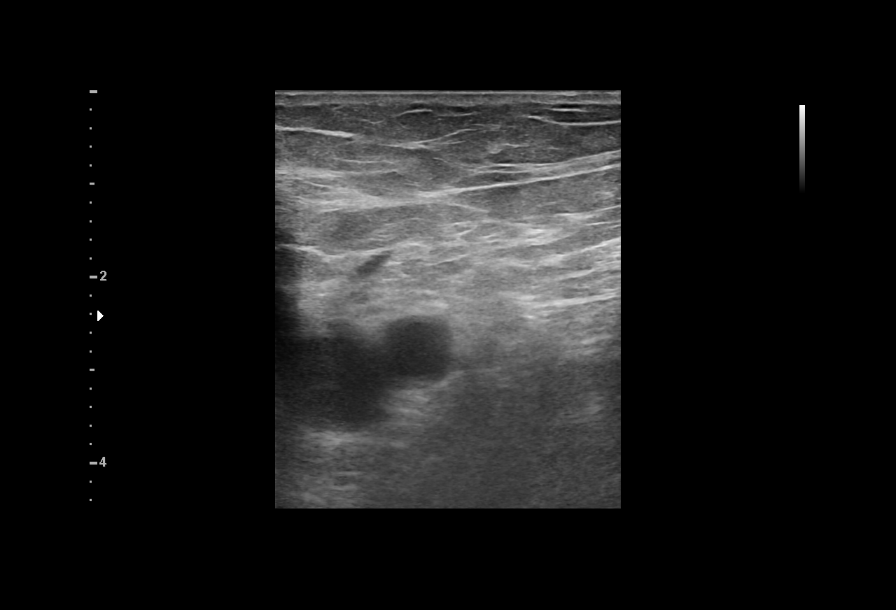
[im 2/5]
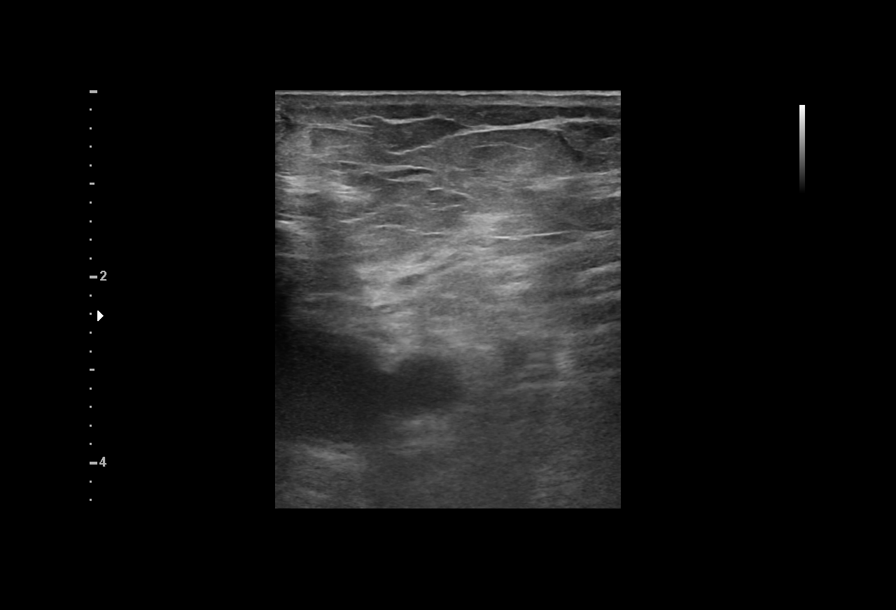
[im 3/5]
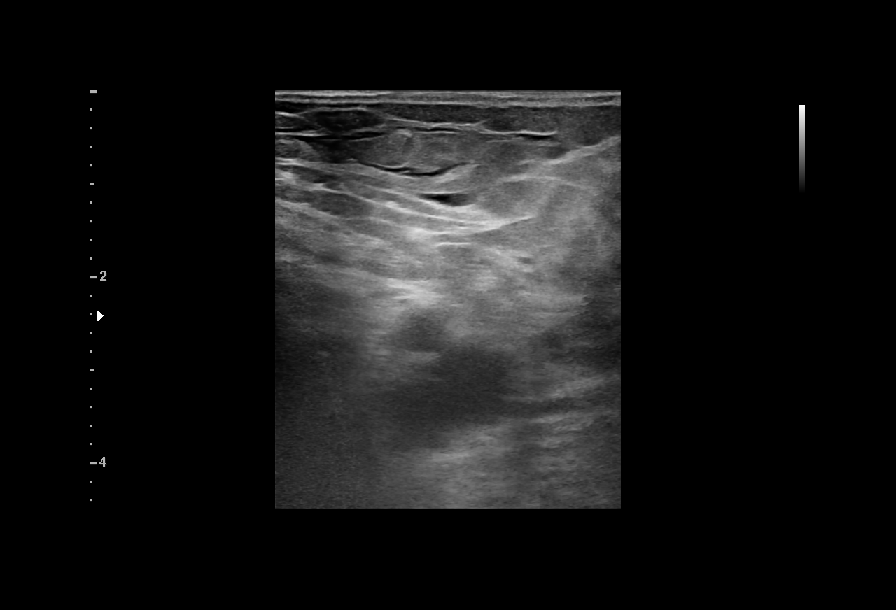
[im 4/5]
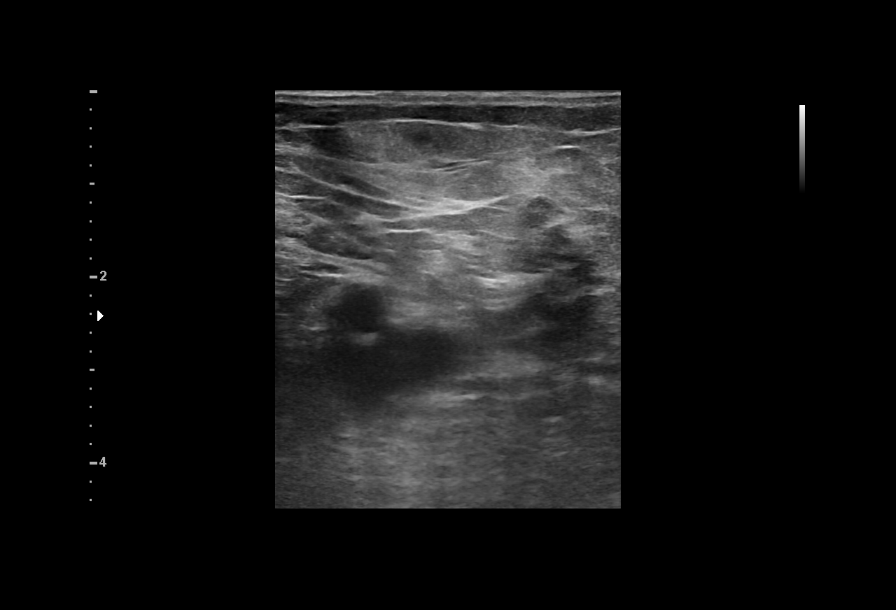
[im 5/5]
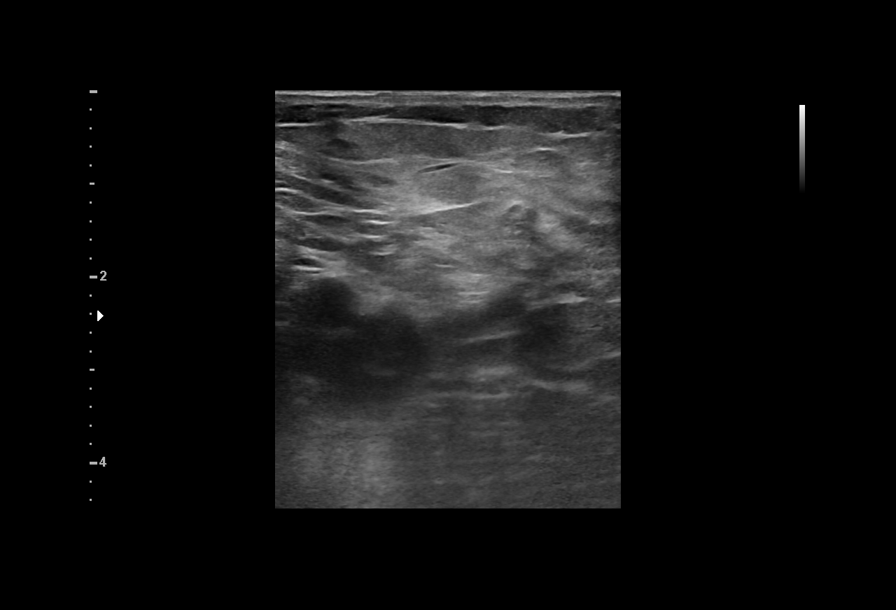

[5 of 5 positions shown; findings below may reference images not displayed]

EXAM:
PELVIC ARTERIOGRAPHY WITH ANGIOGRAPHY IN THE RIGHT COMMON ILIAC
ARTERY, RIGHT INTERNAL ILIAC ARTERY AND RIGHT INTERNAL ILIAC ARTERY
BRANCHES.

EMBOLIZATION OF RIGHT INTERNAL ILIAC ARTERY BRANCHES

ULTRASOUND GUIDANCE FOR VASCULAR ACCESS

MEDICATIONS:
None

ANESTHESIA/SEDATION:
Patient was intubated and already on sedation from the ICU.

CONTRAST:  154 mL Visipaque 320

FLUOROSCOPY TIME:  Fluoroscopy Time: 25 minutes, 6948 mGy

COMPLICATIONS:
None immediate.

PROCEDURE:
Informed consent was obtained from the patient's son following
explanation of the procedure, risks, benefits and alternatives. The
patient's son understands, agrees and consents for the procedure.
All questions were addressed. A time out was performed prior to the
initiation of the procedure.

Both groins were prepped and draped in sterile fashion. Ultrasound
confirmed a patent left common femoral artery. Ultrasound image was
saved for documentation. Skin was anesthetized with 1% lidocaine.
Using ultrasound guidance, 21 gauge needle was directed into the
left common femoral artery and micropuncture dilator set was placed.
Five French vascular sheath was placed. C2 catheter was used to
cannulate the right common iliac artery. Stiff Glidewire was
advanced into the right internal iliac artery and the C2 catheter
was placed in the distal common iliac artery. Angiography was
performed. The right internal iliac artery was selected with a
renegade STC microcatheter. Multiple branches were selected and
until the branch supplying the pseudoaneurysm was cannulated. Vessel
supplying the pseudoaneurysm was very irregular and neither wire nor
catheter could be advanced distal to the pseudoaneurysm. The
proximal aspect of the feeding vessel was embolized with 2 Soft
Interlock coils, both measuring 2 mm x 4 cm. Follow-up angiography
confirmed occlusion of the feeding artery. The adjacent branches
were selected with the Renegade catheter and additional angiography
was performed. An adjacent branch appeared to have some retrograde
filling of the pseudoaneurysm. Therefore, this vessel was treated
with Gel-Foam slurry embolization. Decreased flow in the vessel
following Gel-Foam embolization and no clear evidence for retrograde
filling of the pseudoaneurysm following the embolization. Follow-up
angiogram was performed through the 5 French catheter. Angiogram
through the left groin sheath. Left groin sheath was removed with
manual compression. Attempted to use an ExoSeal but the device did
not deploy. Hemostasis in left groin following manual compression.
FINDINGS: Pseudoaneurysm was identified from a branch of the right internal
iliac artery. Branch appears to be coming off the anterior division.
The main feeding artery may represent the right internal pudendal
artery. The feeding vessel is very irregular. Wire and catheter
could not be advanced distal to the origin of the pseudoaneurysm.
Once the catheter was within the feeding vessel, contrast was
preferentially refluxing which made it difficult to consider
particle embolization of the feeding artery. As a result, the
feeding branch was treated with 2 soft interlock coils proximal to
the pseudoaneurysm. The feeding vessel was successfully embolized.
However, the follow-up angiogram demonstrated delayed retrograde
filling in the region of the pseudoaneurysm. Adjacent branches were
selected and additional angiography was performed. An adjacent
irregular branch which may represent the inferior gluteal artery
appeared to be supplying the pseudoaneurysm through retrograde flow.
This artery was embolized with Gelfoam slurry until there was
pruning of the distal vasculature and slow flow within this vessel.
Final pelvic angiogram demonstrated no significant filling of the
pseudoaneurysm.
IMPRESSION: Pseudoaneurysm originating from a branch of the right internal iliac
artery anterior division. The feeding artery was embolized with
coils proximal to pseudoaneurysm. Adjacent vessel was treated with a
Gel-Foam slurry to decrease the risk of retrograde filling of the
pseudoaneurysm.

## 2020-10-20 IMAGING — XA IR CATHETER TUBE CHANGE
2 series · 2 of 2 positions shown · non-contrast
Comparison: none

INDICATION: Chronic presacral abscess and history of rectal carcinoma. Recent
coiling of a bleeding pseudoaneurysm of a branch of the right
internal iliac artery. The indwelling presacral abscess drainage
catheter requires exchange and up sizing due to leakage fluid around
the drain and through the pelvic floor.

[Series 1: fl (-) angio · 1 of 1 slices shown (1 of 2)]
[im 1/1]
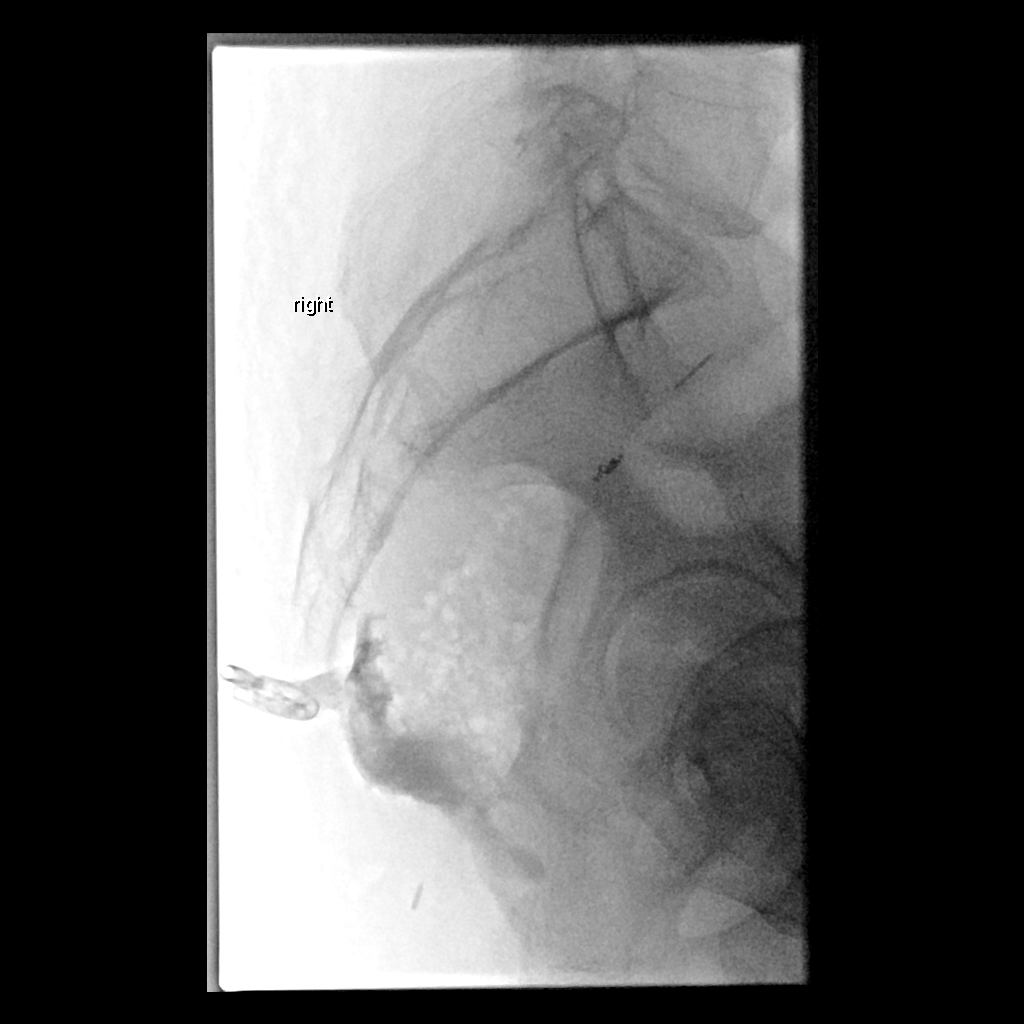

[Series 3: fl (-) angio · 1 of 1 slices shown (2 of 2)]
[im 1/1]
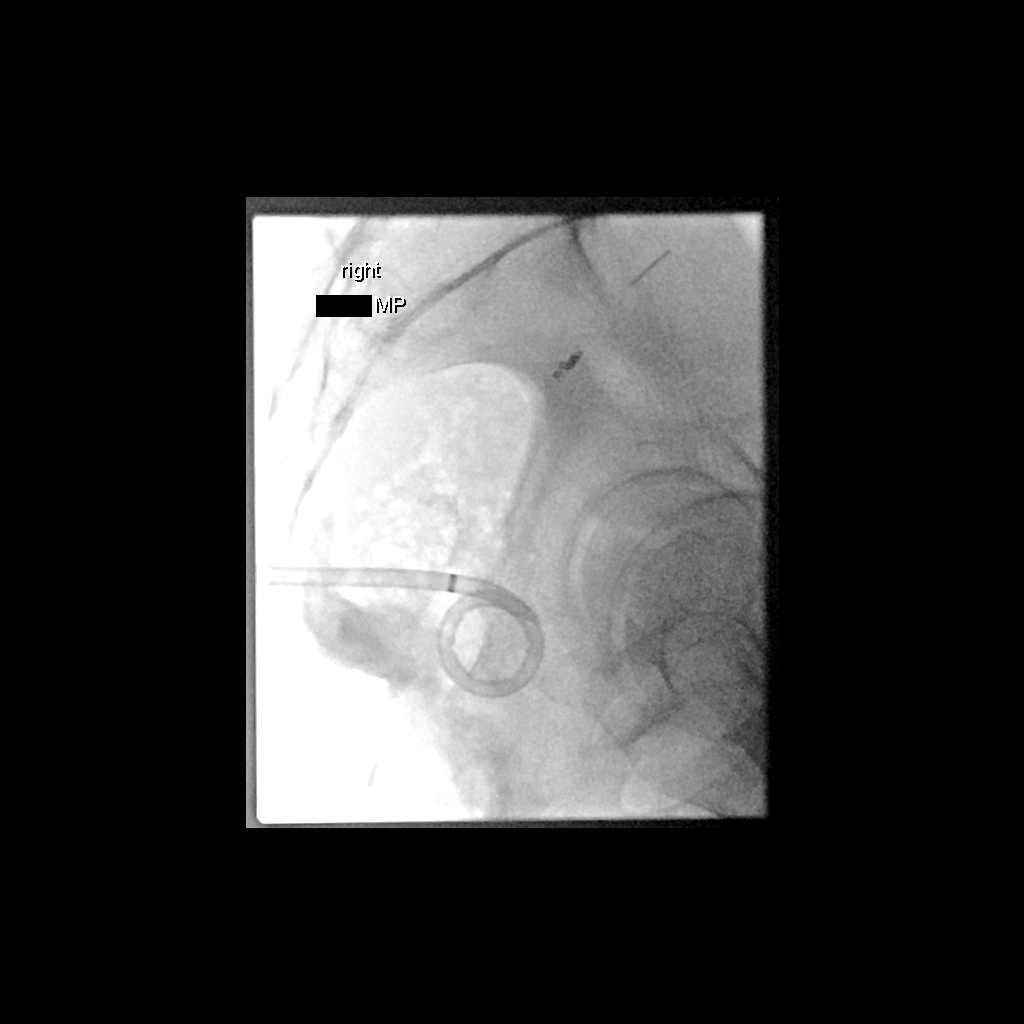

[2 of 2 positions shown; findings below may reference images not displayed]

EXAM:
IR CATHETER TUBE CHANGE

MEDICATIONS:
None

ANESTHESIA/SEDATION:
None

FLUOROSCOPY TIME:  1 minutes and 18 seconds.  35.3 mGy.

CONTRAST:  10 mL Omnipaque 300

COMPLICATIONS:
None immediate.

PROCEDURE:
Informed written consent was obtained from the patient after a
thorough discussion of the procedural risks, benefits and
alternatives. All questions were addressed. Maximal Sterile Barrier
Technique was utilized including caps, mask, sterile gowns, sterile
gloves, sterile drape, hand hygiene and skin antiseptic. A timeout
was performed prior to the initiation of the procedure.

With the patient in a decubitus position, the pre-existing drainage
catheter was inspected under fluoroscopy and injected with contrast
material. The catheter was then removed. A 5 French catheter was
advanced through the tract and into the presacral pelvic space. The
catheter was removed over a guidewire. A new 14 French percutaneous
drainage catheter was advanced into the presacral space. Catheter
position was confirmed by fluoroscopy. The catheter was attached to
a suction bulb. It was secured at the skin with a Prolene retention
suture, StatLock device and overlying dressing.
FINDINGS: At the time of the procedure, the pelvic drain had retracted
posteriorly with the tip located just posterior to the tip of the
sacrum. Catheter size was increased from 12 French to 14 French and
the new catheter positioned deeper in the presacral pelvis. There
was immediate return of bloody fluid from the drain.
IMPRESSION: Exchange and up sizing of posterior pelvic drainage catheter. The
indwelling 12 French catheter had retracted posterior to the
presacral space. A new 14 French drainage catheter was advanced into
the presacral space with return of bloody fluid. This drain was
attached to a suction bulb.
